# Patient Record
Sex: Female | Born: 1965 | Race: White | Hispanic: No | Smoking: Current some day smoker
Health system: Southern US, Community
[De-identification: ages and names within clinical notes are randomized; demographics above are authoritative.]

## PROBLEM LIST (undated history)

## (undated) DIAGNOSIS — J189 Pneumonia, unspecified organism: Secondary | ICD-10-CM

## (undated) DIAGNOSIS — M179 Osteoarthritis of knee, unspecified: Secondary | ICD-10-CM

## (undated) DIAGNOSIS — R51 Headache: Secondary | ICD-10-CM

## (undated) DIAGNOSIS — K219 Gastro-esophageal reflux disease without esophagitis: Secondary | ICD-10-CM

## (undated) DIAGNOSIS — Z91199 Patient's noncompliance with other medical treatment and regimen due to unspecified reason: Secondary | ICD-10-CM

## (undated) DIAGNOSIS — E119 Type 2 diabetes mellitus without complications: Secondary | ICD-10-CM

## (undated) DIAGNOSIS — K222 Esophageal obstruction: Secondary | ICD-10-CM

## (undated) DIAGNOSIS — Z9119 Patient's noncompliance with other medical treatment and regimen: Secondary | ICD-10-CM

## (undated) DIAGNOSIS — M5090 Cervical disc disorder, unspecified, unspecified cervical region: Secondary | ICD-10-CM

## (undated) DIAGNOSIS — I499 Cardiac arrhythmia, unspecified: Secondary | ICD-10-CM

## (undated) DIAGNOSIS — N189 Chronic kidney disease, unspecified: Secondary | ICD-10-CM

## (undated) DIAGNOSIS — I10 Essential (primary) hypertension: Secondary | ICD-10-CM

## (undated) DIAGNOSIS — R002 Palpitations: Secondary | ICD-10-CM

## (undated) DIAGNOSIS — R519 Headache, unspecified: Secondary | ICD-10-CM

## (undated) DIAGNOSIS — H33193 Other retinoschisis and retinal cysts, bilateral: Secondary | ICD-10-CM

## (undated) DIAGNOSIS — K589 Irritable bowel syndrome without diarrhea: Secondary | ICD-10-CM

## (undated) DIAGNOSIS — M1710 Unilateral primary osteoarthritis, unspecified knee: Secondary | ICD-10-CM

## (undated) DIAGNOSIS — F439 Reaction to severe stress, unspecified: Secondary | ICD-10-CM

## (undated) DIAGNOSIS — A498 Other bacterial infections of unspecified site: Secondary | ICD-10-CM

## (undated) DIAGNOSIS — N3010 Interstitial cystitis (chronic) without hematuria: Secondary | ICD-10-CM

## (undated) DIAGNOSIS — N390 Urinary tract infection, site not specified: Secondary | ICD-10-CM

## (undated) DIAGNOSIS — K602 Anal fissure, unspecified: Secondary | ICD-10-CM

## (undated) HISTORY — DX: Cervical disc disorder, unspecified, unspecified cervical region: M50.90

## (undated) HISTORY — DX: Patient's noncompliance with other medical treatment and regimen: Z91.19

## (undated) HISTORY — DX: Essential (primary) hypertension: I10

## (undated) HISTORY — DX: Palpitations: R00.2

## (undated) HISTORY — DX: Unilateral primary osteoarthritis, unspecified knee: M17.10

## (undated) HISTORY — DX: Anal fissure, unspecified: K60.2

## (undated) HISTORY — DX: Other bacterial infections of unspecified site: A49.8

## (undated) HISTORY — DX: Patient's noncompliance with other medical treatment and regimen due to unspecified reason: Z91.199

## (undated) HISTORY — PX: CYSTOSTOMY W/ BLADDER DILATION: SHX1432

## (undated) HISTORY — DX: Other retinoschisis and retinal cysts, bilateral: H33.193

## (undated) HISTORY — PX: ESOPHAGUS SURGERY: SHX626

## (undated) HISTORY — DX: Reaction to severe stress, unspecified: F43.9

## (undated) HISTORY — PX: LAPAROSCOPIC NEPHRECTOMY: SUR781

## (undated) HISTORY — DX: Gastro-esophageal reflux disease without esophagitis: K21.9

## (undated) HISTORY — DX: Interstitial cystitis (chronic) without hematuria: N30.10

## (undated) HISTORY — DX: Pneumonia, unspecified organism: J18.9

## (undated) HISTORY — PX: COLONOSCOPY: SHX174

## (undated) HISTORY — DX: Irritable bowel syndrome, unspecified: K58.9

## (undated) HISTORY — DX: Esophageal obstruction: K22.2

## (undated) HISTORY — DX: Osteoarthritis of knee, unspecified: M17.9

## (undated) HISTORY — PX: LAPAROSCOPIC VAGINAL HYSTERECTOMY: SUR798

## (undated) HISTORY — DX: Urinary tract infection, site not specified: N39.0

---

## 1993-11-13 HISTORY — PX: BREAST EXCISIONAL BIOPSY: SUR124

## 1999-01-25 ENCOUNTER — Other Ambulatory Visit: Admission: RE | Admit: 1999-01-25 | Discharge: 1999-01-25 | Payer: Self-pay | Admitting: Obstetrics and Gynecology

## 1999-05-27 ENCOUNTER — Observation Stay (HOSPITAL_COMMUNITY): Admission: AD | Admit: 1999-05-27 | Discharge: 1999-05-27 | Payer: Self-pay | Admitting: Obstetrics and Gynecology

## 1999-08-02 ENCOUNTER — Inpatient Hospital Stay (HOSPITAL_COMMUNITY): Admission: AD | Admit: 1999-08-02 | Discharge: 1999-08-06 | Payer: Self-pay | Admitting: Obstetrics and Gynecology

## 1999-08-06 ENCOUNTER — Encounter (HOSPITAL_COMMUNITY): Admission: RE | Admit: 1999-08-06 | Discharge: 1999-11-04 | Payer: Self-pay | Admitting: Obstetrics and Gynecology

## 1999-09-07 ENCOUNTER — Other Ambulatory Visit: Admission: RE | Admit: 1999-09-07 | Discharge: 1999-09-07 | Payer: Self-pay | Admitting: Obstetrics and Gynecology

## 1999-11-05 ENCOUNTER — Encounter: Admission: RE | Admit: 1999-11-05 | Discharge: 2000-02-03 | Payer: Self-pay | Admitting: Obstetrics and Gynecology

## 2000-04-11 ENCOUNTER — Encounter: Payer: Self-pay | Admitting: Family Medicine

## 2000-04-11 ENCOUNTER — Ambulatory Visit (HOSPITAL_COMMUNITY): Admission: RE | Admit: 2000-04-11 | Discharge: 2000-04-11 | Payer: Self-pay | Admitting: Family Medicine

## 2000-11-28 ENCOUNTER — Other Ambulatory Visit: Admission: RE | Admit: 2000-11-28 | Discharge: 2000-11-28 | Payer: Self-pay | Admitting: Obstetrics and Gynecology

## 2002-03-27 ENCOUNTER — Other Ambulatory Visit: Admission: RE | Admit: 2002-03-27 | Discharge: 2002-03-27 | Payer: Self-pay | Admitting: Obstetrics and Gynecology

## 2002-12-16 ENCOUNTER — Encounter: Payer: Self-pay | Admitting: Emergency Medicine

## 2002-12-16 ENCOUNTER — Emergency Department (HOSPITAL_COMMUNITY): Admission: EM | Admit: 2002-12-16 | Discharge: 2002-12-16 | Payer: Self-pay | Admitting: Emergency Medicine

## 2003-01-08 ENCOUNTER — Encounter (INDEPENDENT_AMBULATORY_CARE_PROVIDER_SITE_OTHER): Payer: Self-pay | Admitting: Specialist

## 2003-01-08 ENCOUNTER — Ambulatory Visit (HOSPITAL_COMMUNITY): Admission: RE | Admit: 2003-01-08 | Discharge: 2003-01-08 | Payer: Self-pay | Admitting: Obstetrics and Gynecology

## 2005-04-04 ENCOUNTER — Other Ambulatory Visit: Admission: RE | Admit: 2005-04-04 | Discharge: 2005-04-04 | Payer: Self-pay | Admitting: Obstetrics and Gynecology

## 2006-09-13 ENCOUNTER — Other Ambulatory Visit: Admission: RE | Admit: 2006-09-13 | Discharge: 2006-09-13 | Payer: Self-pay | Admitting: Gynecology

## 2007-01-12 HISTORY — PX: OTHER SURGICAL HISTORY: SHX169

## 2007-01-16 ENCOUNTER — Encounter (INDEPENDENT_AMBULATORY_CARE_PROVIDER_SITE_OTHER): Payer: Self-pay | Admitting: Specialist

## 2007-01-16 ENCOUNTER — Ambulatory Visit (HOSPITAL_BASED_OUTPATIENT_CLINIC_OR_DEPARTMENT_OTHER): Admission: RE | Admit: 2007-01-16 | Discharge: 2007-01-16 | Payer: Self-pay | Admitting: Gynecology

## 2007-12-05 ENCOUNTER — Other Ambulatory Visit: Admission: RE | Admit: 2007-12-05 | Discharge: 2007-12-05 | Payer: Self-pay | Admitting: Gynecology

## 2008-01-22 ENCOUNTER — Other Ambulatory Visit: Admission: RE | Admit: 2008-01-22 | Discharge: 2008-01-22 | Payer: Self-pay | Admitting: Gynecology

## 2008-02-19 ENCOUNTER — Ambulatory Visit (HOSPITAL_BASED_OUTPATIENT_CLINIC_OR_DEPARTMENT_OTHER): Admission: RE | Admit: 2008-02-19 | Discharge: 2008-02-20 | Payer: Self-pay | Admitting: Gynecology

## 2008-02-19 ENCOUNTER — Encounter: Payer: Self-pay | Admitting: Gynecology

## 2008-07-31 HISTORY — PX: US ECHOCARDIOGRAPHY: HXRAD669

## 2008-08-06 ENCOUNTER — Encounter: Admission: RE | Admit: 2008-08-06 | Discharge: 2008-08-06 | Payer: Self-pay | Admitting: Cardiology

## 2008-09-13 HISTORY — PX: OTHER SURGICAL HISTORY: SHX169

## 2008-10-12 ENCOUNTER — Ambulatory Visit (HOSPITAL_BASED_OUTPATIENT_CLINIC_OR_DEPARTMENT_OTHER): Admission: RE | Admit: 2008-10-12 | Discharge: 2008-10-12 | Payer: Self-pay | Admitting: Orthopedic Surgery

## 2008-11-13 HISTORY — PX: MOLE REMOVAL: SHX2046

## 2008-12-10 ENCOUNTER — Ambulatory Visit: Payer: Self-pay | Admitting: Gastroenterology

## 2008-12-10 DIAGNOSIS — R1319 Other dysphagia: Secondary | ICD-10-CM | POA: Insufficient documentation

## 2008-12-10 DIAGNOSIS — K219 Gastro-esophageal reflux disease without esophagitis: Secondary | ICD-10-CM | POA: Insufficient documentation

## 2008-12-15 ENCOUNTER — Telehealth: Payer: Self-pay | Admitting: Gastroenterology

## 2008-12-17 ENCOUNTER — Encounter: Payer: Self-pay | Admitting: Gastroenterology

## 2009-01-07 ENCOUNTER — Ambulatory Visit: Payer: Self-pay | Admitting: Gastroenterology

## 2009-01-07 ENCOUNTER — Encounter: Payer: Self-pay | Admitting: Gastroenterology

## 2009-01-10 ENCOUNTER — Encounter: Payer: Self-pay | Admitting: Gastroenterology

## 2009-01-14 ENCOUNTER — Telehealth: Payer: Self-pay | Admitting: Gastroenterology

## 2009-01-27 ENCOUNTER — Ambulatory Visit: Payer: Self-pay | Admitting: Gynecology

## 2009-02-17 ENCOUNTER — Ambulatory Visit: Payer: Self-pay | Admitting: Gastroenterology

## 2009-02-17 DIAGNOSIS — R1031 Right lower quadrant pain: Secondary | ICD-10-CM | POA: Insufficient documentation

## 2009-02-17 DIAGNOSIS — C4490 Unspecified malignant neoplasm of skin, unspecified: Secondary | ICD-10-CM | POA: Insufficient documentation

## 2009-02-17 DIAGNOSIS — R1032 Left lower quadrant pain: Secondary | ICD-10-CM

## 2009-02-17 DIAGNOSIS — K921 Melena: Secondary | ICD-10-CM | POA: Insufficient documentation

## 2009-02-24 ENCOUNTER — Ambulatory Visit: Payer: Self-pay | Admitting: Gastroenterology

## 2009-02-24 ENCOUNTER — Encounter: Payer: Self-pay | Admitting: Gastroenterology

## 2009-02-28 ENCOUNTER — Encounter: Payer: Self-pay | Admitting: Gastroenterology

## 2009-08-02 ENCOUNTER — Ambulatory Visit: Payer: Self-pay | Admitting: Gynecology

## 2009-08-02 ENCOUNTER — Encounter: Payer: Self-pay | Admitting: Gynecology

## 2009-08-02 ENCOUNTER — Other Ambulatory Visit: Admission: RE | Admit: 2009-08-02 | Discharge: 2009-08-02 | Payer: Self-pay | Admitting: Gynecology

## 2009-09-24 ENCOUNTER — Telehealth: Payer: Self-pay | Admitting: Gastroenterology

## 2009-11-13 HISTORY — PX: KNEE ARTHROSCOPY: SHX127

## 2009-11-25 ENCOUNTER — Ambulatory Visit (HOSPITAL_BASED_OUTPATIENT_CLINIC_OR_DEPARTMENT_OTHER): Admission: RE | Admit: 2009-11-25 | Discharge: 2009-11-25 | Payer: Self-pay | Admitting: Orthopedic Surgery

## 2009-12-02 ENCOUNTER — Inpatient Hospital Stay (HOSPITAL_COMMUNITY): Admission: EM | Admit: 2009-12-02 | Discharge: 2009-12-05 | Payer: Self-pay | Admitting: Orthopedic Surgery

## 2010-03-16 DIAGNOSIS — M2569 Stiffness of other specified joint, not elsewhere classified: Secondary | ICD-10-CM | POA: Insufficient documentation

## 2010-03-16 DIAGNOSIS — I10 Essential (primary) hypertension: Secondary | ICD-10-CM | POA: Insufficient documentation

## 2010-03-16 DIAGNOSIS — IMO0002 Reserved for concepts with insufficient information to code with codable children: Secondary | ICD-10-CM | POA: Insufficient documentation

## 2010-03-16 DIAGNOSIS — K449 Diaphragmatic hernia without obstruction or gangrene: Secondary | ICD-10-CM | POA: Insufficient documentation

## 2010-03-16 DIAGNOSIS — M542 Cervicalgia: Secondary | ICD-10-CM | POA: Insufficient documentation

## 2010-11-15 ENCOUNTER — Telehealth: Payer: Self-pay | Admitting: Gastroenterology

## 2010-12-13 NOTE — Progress Notes (Signed)
Summary: ? EGD  Phone Note Call from Patient Call back at Home Phone [phone] Call back at C# [phone]   Caller: Patient Call For: Deanna George  Reason for Call: Talk to Nurse Details for Reason: ? EGD  Summary of Call: had EGD last week.Marland KitchenMarland KitchenMarland KitchenYuka George is unclear about growth in her throat and the biopsy ( would like a ph call back today so she can have a piece of mind)  Initial call taken by: Guadlupe [facility],  January 14, 2009 1:33 PM  Follow-up for Phone Call        went over the procedure and the reflux regime again with the patient and instructed about the importance of taking her nexium on a daily basis. Also advised her she will be getting a letter in the mail Follow-up by: Harlow Mares CMA,  January 14, 2009 2:22 PM

## 2010-12-13 NOTE — Assessment & Plan Note (Signed)
Summary: abd pain, rectal bleeding,hemmorroids.Marland Kitchenem   History of Present Illness Visit Type: Follow-up Visit Primary GI MD: Elie Goody MD [facility] Primary Provider: none Chief Complaint: abdominal pain,rectal bleeding and hemorrhoids History of Present Illness:   Deanna George returns today complaining of chronic intermittent left lower quadrant abdominal pain associated with bowel movements. She notes a variation between loose watery bowel movements and small pellet-like bowel movements. The pain generally improves or abates with a bowel movement. She has had intermittent small-volume hematochezia for many years. She states she underwent colonoscopy in 1992 in [place] and irritable bowel syndrome and hemorrhoids were diagnosed.  Her dysphagia resolved following dilation. Reflux symptoms are under good control on Nexium   GI Review of Systems    Reports abdominal pain.     Location of  Abdominal pain: LLQ.    Denies acid reflux, belching, bloating, chest pain, dysphagia with liquids, dysphagia with solids, heartburn, loss of appetite, nausea, vomiting, vomiting blood, weight loss, and  weight gain.      Reports constipation, diarrhea, hemorrhoids, irritable bowel syndrome, and  rectal bleeding.     Denies anal fissure, black tarry stools, change in bowel habit, diverticulosis, fecal incontinence, heme positive stool, jaundice, light color stool, liver problems, and  rectal pain.   Current Medications (verified): 1)  Benicar 20 Mg Tabs (Olmesartan Medoxomil) .... Once Daily 2)  Cyclobenzaprine Hcl 10 Mg Tabs (Cyclobenzaprine Hcl) .... Take 1 Tab 2 To 3 Times Daily As Needed For Spasms 3)  Celebrex 200 Mg Caps (Celecoxib) .... Take As Needed Once Weekly 4)  Oscal 500/200 D-3 500-200 Mg-Unit Tabs (Calcium-Vitamin D) .... Take As Needed 5)  Nexium 40 Mg Cpdr (Esomeprazole Magnesium) .... One Tablet By Mouth Once Daily  Allergies (verified): No Known Drug Allergies  Past History:  Past  Medical History:    Anxiety Disorder    Glaucoma    Hypertension    Interstitial Cystitis    Pneumonia    Urinary Tract Infection    GERD/EE    Esophageal stricture    IBS  Past Surgical History:    Hysterectomy, 02/2008    Lapascopic surgery x2 ( uterus & ovary), 01/2007    C-section x2, 1998, 2000    L foot pin post fracture, 09/2008    Skin cancer removal multiple  Family History:    Reviewed history from 12/10/2008 and no changes required:       Patient adopted   Social History:    Reviewed history from 12/10/2008 and no changes required:       Occupation:Housewife       Patient has never smoked.        Alcohol Use - no       Daily Caffeine Use 1-2 weekly       Illicit Drug Use - no  Review of Systems       The patient complains of back pain, fatigue, and urination changes/pain.         The pertinent positives and negatives are noted as above and in the HPI. All other ROS were reviewed and were negative.   Vital Signs:  Patient profile:   45 year old female Height:      64.5 inches Weight:      185.38 pounds BMI:     31.44 Pulse rate:   84 / minute BP sitting:   128 / 88  (left arm)  Vitals Entered By: Milford Cage CMA (February 17, 2009 9:29 AM)  Physical  Exam  General:  Well developed, well nourished, no acute distress. Head:  Normocephalic and atraumatic. Mouth:  No deformity or lesions, dentition normal. Lungs:  Clear throughout to auscultation. Heart:  Regular rate and rhythm; no murmurs, rubs,  or bruits. Abdomen:  Soft, nontender and nondistended. No masses, hepatosplenomegaly or hernias noted. Normal bowel sounds. Rectal:  deferred until time of colonoscopy.   Neurologic:  Alert and  oriented x4;  grossly normal neurologically. Psych:  anxious.     Impression & Recommendations:  Problem # 1:  ABDOMINAL PAIN-LLQ (ICD-789.04) Recurrent left lower quadrant abdominal pain associated with variable bowel habits. Her symptoms are typical for irritable  bowel syndrome. Exclude other disorders with colonoscopy. The risks, benefits and alternatives to colonoscopy with possible biopsy and possible polypectomy were discussed with the patient and they consent to proceed. The procedure will be scheduled electively. Orders: Colonoscopy (Colon)  Problem # 2:  BLOOD IN STOOL (ICD-578.1) Intermittent small-volume hematochezia. This pattern is typical for hemorrhoidal bleeding. Need to exclude proctitis and colorectal neoplasms. Colonoscopy as a problem #1  Orders: Colonoscopy (Colon)  Problem # 3:  GERD (ICD-530.81) Assessment: Improved Complicated by erosive esophagitis and a peptic stricture. Dysphagia resolved with dilation. Long-term PPI therapy. Nexium 40 mg q.a.m. and standard antireflux measures.  Patient Instructions: 1)  Colonoscopy brochure given.  2)  Conscious Sedation brochure given.  3)  The medication list was reviewed and reconciled.  All changed / newly prescribed medications were explained.  A complete medication list was provided to the patient / caregiver.  Prescriptions: MOVIPREP 100 GM  SOLR (PEG-KCL-NACL-NASULF-NA ASC-C) As per prep instructions.  #1 x 0   Entered by:   Christie Nottingham CMA   Authorized by:   Meryl Dare MD [facility]   Signed by:   Christie Nottingham CMA on 02/17/2009   Method used:   Electronically to        [id number] (retail)       [address]       [place], [place]  [address]       Ph: [phone]       Fax: [phone]   RxID:   [id number]

## 2010-12-13 NOTE — Progress Notes (Signed)
Summary: Samples helped her alot  Phone Note Call from Patient Call back at [phone] cell   Call For: Dr Russella Dar Reason for Call: Talk to Nurse Summary of Call: Question about the diffrent prescription you were calling in. Samples that were given to her one week ago worked extremely well. Alll her symptoms are gone. By the third day no hurt, no discomfort, no acid reflux. Can now swallow and wonders if she still needs Endo.  Initial call taken by: Leanor Kail [facility],  December 15, 2008 10:07 AM  Follow-up for Phone Call        spoke with patient she does need EGD on Friday she will keep appt.  Given more samples of nexium.  RX sent to pharmacy Follow-up by: Darcey Nora RN,  December 15, 2008 10:40 AM      Prescriptions: NEXIUM 40 MG CPDR (ESOMEPRAZOLE MAGNESIUM) one tablet by mouth once daily  #30 x 11   Entered by:   Darcey Nora RN   Authorized by:   Meryl Dare MD [facility]   Signed by:   Darcey Nora RN on 12/15/2008   Method used:   Electronically to        [id number]* (retail)       [address]       [place], [place]  [address]       Ph: [phone]       Fax: [phone]   RxID:   [id number]

## 2010-12-13 NOTE — Letter (Signed)
Summary: Patient Notice-Hyperplastic Polyps  [facility]  [address] [place], [place] [address]   Phone: [phone]  Fax: [phone]        February 28, 2009 MRN: [id number]    [facility] [address] [place], [place]  [address]    Dear Ms. Greenslade,  I am pleased to inform you that the colon polyps removed during your recent colonoscopy were found to be hyperplastic. These types of polyps are NOT pre-cancerous.  It is therefore my recommendation that you have a repeat colonoscopy examination in 10 years for routine colorectal cancer screening.  Should you develop new or worsening symptoms of abdominal pain, bowel habit changes or bleeding from the rectum or bowels, please schedule an evaluation with either your primary care physician or with me.  Continue treatment plan as outlined the day of your exam.  Please call us if you are having persistent problems or have questions about your condition that have not been fully answered at this time.  Sincerely,  Meryl Dare MD [facility]  This letter has been electronically signed by your physician.

## 2010-12-13 NOTE — Assessment & Plan Note (Signed)
Summary: CHOKING/ACID REFLUX...EM   History of Present Illness Visit Type: new patient Primary GI MD: Elie Goody MD [facility] Chief Complaint: dysphagia-solids, gerd History of Present Illness:   This is a 45 year old white female who relates significant problems with acid reflux and worsening solid food dysphagia since September 2009. She has postprandial and nighttime heartburn and regurgitation. Has been taking Prevacid OTC for the past 1 to 2 weeks with some improvement in symptoms.   GI Review of Systems    Reports acid reflux, bloating, dysphagia with solids, loss of appetite, and  nausea.      Denies abdominal pain, belching, chest pain, dysphagia with liquids, heartburn, vomiting, vomiting blood, weight loss, and  weight gain.      Reports change in bowel habits and  hemorrhoids.     Denies anal fissure, black tarry stools, constipation, diarrhea, diverticulosis, fecal incontinence, heme positive stool, irritable bowel syndrome, jaundice, light color stool, liver problems, rectal bleeding, and  rectal pain.    Prior Medications Reviewed Using: Medication Bottles  Updated Prior Medication List: BENICAR 20 MG TABS (OLMESARTAN MEDOXOMIL) once daily PREVACID 15 MG CPDR (LANSOPRAZOLE) once daily CYCLOBENZAPRINE HCL 10 MG TABS (CYCLOBENZAPRINE HCL) Take 1 tab 2 to 3 times daily as needed for spasms CELEBREX 200 MG CAPS (CELECOXIB) two times a day OSCAL 500/200 D-3 500-200 MG-UNIT TABS (CALCIUM-VITAMIN D) two times a day TUMS 500 MG CHEW (CALCIUM CARBONATE ANTACID) as needed  Current Allergies: No known allergies  Past Medical History:    Anxiety Disorder    Glaucoma    Hypertension    Interstitial Cystitis    Pneumonia    Urinary Tract Infection  Past Surgical History:    Hysterectomy, 02/2008    Lapascopic surgery x2 ( uterus & ovary), 01/2007    C-section x2, 1998, 2000    L foot pin post fracture, 09/2008  Family History:    Patient adopted   Social History:  Occupation:Housewife    Patient has never smoked.     Alcohol Use - no    Daily Caffeine Use 1-2 weekly    Illicit Drug Use - no  Risk Factors:  Tobacco use:  never Drug use:  no Alcohol use:  no  Review of Systems       The patient complains of blood in urine, heart rhythm changes, sore throat, and urine leakage.         The pertinent positives and negatives are noted as above and in the HPI. All other ROS were negative.   Vital Signs:  Patient Profile:   45 Years Old Female Height:     64 inches Weight:      184.13 pounds BMI:     31.72 Pulse rate:   60 / minute Pulse rhythm:   regular BP sitting:   120 / 88  (left arm)  Vitals Entered By: June McMurray CMA (December 10, 2008 9:54 AM)                  Physical Exam  General:     Well developed, well nourished, no acute distress. Head:     Normocephalic and atraumatic. Eyes:     PERRLA, no icterus. Ears:     Normal auditory acuity. Mouth:     No deformity or lesions, dentition normal. Neck:     Supple; no masses or thyromegaly. Lungs:     Clear throughout to auscultation. Heart:     Regular rate and rhythm; no murmurs, rubs,  or bruits. Abdomen:     Soft, nontender and nondistended. No masses, hepatosplenomegaly or hernias noted. Normal bowel sounds. Msk:     Symmetrical with no gross deformities. Normal posture. Pulses:     Normal pulses noted. Extremities:     No clubbing, cyanosis, edema or deformities noted. Neurologic:     Alert and  oriented x4;  grossly normal neurologically. Cervical Nodes:     No significant cervical adenopathy. Psych:     Alert and cooperative. anxious.     Impression & Recommendations:  Problem # 1:  OTHER DYSPHAGIA (ICD-787.29) Worsening solid food dysphagia. Rule out esophageal strictures, infectious esophagitis and other disorders. Discontinue Prevacid OTC and begin Nexium 40 mg q.a.m. The risks, benefits and alternatives to endoscopy with possible biopsy and  possible dilation were discussed with the patient and they consent to proceed. The procedure will be scheduled electively.  Orders: EGD SAV (EGD SAV)   Problem # 2:  GERD (ICD-530.81) As in problem #1. Orders: EGD SAV (EGD SAV)   Patient Instructions: 1)  You have been scheduled for a EGD with savaray.  2)  Avoid foods high in acid content (tomatoes, citrus juices, spicy foods). Avoid eating within 3 to 4 hours of lying down or before exercising. Do not over eat; try smaller more frequent meals. Elevate head of bed four inches when sleeping. 3)  Stop Prevacid and start Nexium and pick prescription at your pharmacy.

## 2010-12-13 NOTE — Progress Notes (Signed)
Summary: Nexium  Phone Note Call from Patient Call back at [phone]   Caller: Patient Call For: Dr. Russella Dar Reason for Call: Talk to Nurse Details for Reason: Nexium Summary of Call: Patient called this morning stating that it was time for her to refill her Nexium; however, her copay with Nexium is $65.  She asked if there was another medication that would work just as good as Nexium but that would not cost her as much.  She asked if you would call and let her know and that if you couldn't reach her directly to please leave her a voice mail.  She uses the [facility] on [address].  Her cell phone number is [phone].  The phone number for the pharmacy at [facility] is [phone].  Thank you. Initial call taken by: Schuyler Amor,  September 24, 2009 10:47 AM  Follow-up for Phone Call        Left a message stating the only medicine that her insurance will cover is omeprazole 40mg  once daily that I can tell and to call our office.  Follow-up by: Christie Nottingham CMA [facility]),  September 24, 2009 1:45 PM  Additional Follow-up for Phone Call Additional follow up Details #1::        Pt returned my call and states that would like to try omeprazole to see if this is a cheaper copay for her and if it is not then she will go back on Nexium. Rx was sent to pts pharmacy.  Additional Follow-up by: Christie Nottingham CMA (AAMA),  September 24, 2009 2:20 PM    New/Updated Medications: OMEPRAZOLE 40 MG CPDR (OMEPRAZOLE) one tablet by mouth once daily Prescriptions: OMEPRAZOLE 40 MG CPDR (OMEPRAZOLE) one tablet by mouth once daily  #30 x 11   Entered by:   Christie Nottingham CMA (AAMA)   Authorized by:   Meryl Dare MD [facility]   Signed by:   Christie Nottingham CMA [facility]) on 09/24/2009   Method used:   Electronically to        [facility] (retail)       [address]       [place], [place]  [address]       Ph: [phone] or  [phone]       Fax: [phone]   RxID:   [id number]

## 2010-12-13 NOTE — Letter (Signed)
Summary: Patient [facility] Biopsy Results  [facility]  [address] [place], [place] [address]   Phone: [phone]  Fax: [phone]        January 10, 2009 MRN: [id number]    [name] [address] [place], [place]  [address]    Dear Ms. Prewitt,  I am pleased to inform you that the biopsies taken during your recent endoscopic examination did not show any evidence of cancer upon pathologic examination. The biopies showed changes of acid reflux.  Continue with the treatment plan as outlined on the day of your      exam.  Please call us if you are having persistent problems or have questions about your condition that have not been fully answered at this time.  Sincerely,  Meryl Dare MD [facility]  This letter has been electronically signed by your physician.

## 2010-12-13 NOTE — Procedures (Signed)
Summary: Colonoscopy   Colonoscopy  Procedure date:  02/24/2009  Findings:      Location:  [facility].    Procedures Next Due Date:    Colonoscopy: 02/2019  COLONOSCOPY PROCEDURE REPORT  PATIENT:  Deanna, George  MR#:  [id number] BIRTHDATE:   14-Aug-1966, 42 yrs. old   GENDER:   female  ENDOSCOPIST:   Judie Petit T. Russella Dar, MD, [facility]    PROCEDURE DATE:  02/24/2009 PROCEDURE:  Colonoscopy with biopsy ASA CLASS:   Class II INDICATIONS: 1) hematochezia  2) LLQ abdominal pain   MEDICATIONS:    Benadryl 50 mg IV, Versed 12 mg IV, Fentanyl 125 mcg IV  DESCRIPTION OF PROCEDURE:   After the risks benefits and alternatives of the procedure were thoroughly explained, informed consent was obtained.  Digital rectal exam was performed and revealed small external hemorrhoids.   The LB [id number] endoscope was introduced through the anus and advanced to the cecum, which was identified by both the appendix and ileocecal valve, without limitations.  The quality of the prep was excellent, using MoviPrep.  The instrument was then slowly withdrawn as the colon was fully examined. <<PROCEDUREIMAGES>>              <<OLD IMAGES>>  FINDINGS:  Three polyps were found in the rectum and sigmoid colon. They were 2 - 3 mm in size. The polyps were removed using cold biopsy forceps.  The terminal ileum appeared normal. This was otherwise a normal examination of the colon. Retroflexed views in the rectum revealed no abnormalities. The time to cecum =  2  minutes. The scope was then withdrawn (time =  11.75  min) from the patient and the procedure completed.  COMPLICATIONS:   None  ENDOSCOPIC IMPRESSION:  1) 2 - 3 mm, three polyps in the rectum and sigmoid colon  2) Normal terminal ileum  RECOMMENDATIONS:  1) await pathology results  2) If the polyp(s) removed today are proven to be adenomatous (pre-cancerous) polyps, you will need a repeat colonoscopy in 5 years. Otherwise you should  continue to follow colorectal cancer screening guidelines for "routine risk" patients with colonoscopy in 10 years. 3) treat for IBS and hemorrhoids     Caspar Favila T. Russella Dar, MD, [facility]    CC:       REPORT OF SURGICAL PATHOLOGY   Case #: [id number] Patient Name: Deanna George, Deanna George. Office Chart Number:  [id number]   MRN: [id number] Pathologist: Beulah Gandy. Luisa Hart, MD DOB/Age  01/18/1966 (Age: 57)    Gender: F Date Taken:  02/24/2009 Date Received: 02/25/2009   FINAL DIAGNOSIS   ***MICROSCOPIC EXAMINATION AND DIAGNOSIS***   COLON, SIGMOID AND RECTAL POLYPS:  HYPERPLASTIC POLYPS.  NO ADENOMATOUS CHANGE OR MALIGNANCY IDENTIFIED.   mw Date Reported:  02/26/2009     Beulah Gandy. Luisa Hart, MD    February 28, 2009 MRN: [id number]    [facility] [address] [place], [place]  [address]    Dear Ms. Mehra,  I am pleased to inform you that the colon polyps removed during your recent colonoscopy were found to be hyperplastic. These types of polyps are NOT pre-cancerous.  It is therefore my recommendation that you have a repeat colonoscopy examination in 10 years for routine colorectal cancer screening.  Should you develop new or worsening symptoms of abdominal pain, bowel habit changes or bleeding from the rectum or bowels, please schedule an evaluation with either your primary care physician or with me.  Continue treatment plan as outlined  the day of your exam.  Please call us if you are having persistent problems or have questions about your condition that have not been fully answered at this time.  Sincerely,  Meryl Dare MD [facility]  This letter has been electronically signed by your physician.    This report was created from the original endoscopy report, which was reviewed and signed by the above listed endoscopist.

## 2010-12-15 NOTE — Progress Notes (Signed)
Summary: Medication  Phone Note Call from Patient Call back at [phone]   Caller: Patient Call For: Dr. Russella Dar Reason for Call: Talk to Nurse Summary of Call: Pt needs her omeprazole refilled but now it must go through her insurance company, also has a questions about what she can use in the mean time before her medication gets mailed Initial call taken by: [place] Johnson,  November 15, 2010 11:47 AM  Follow-up for Phone Call        Sent omeprazole to [facility] for a 90 day supply because pt's prescription ran out in November and she is concerned she will have a problem getting her prescription. Told pt that she should take omeprazole 20mg  2 tablets by mouth once daily in the meantime of waiting for her mail order. Pt agreed.  Follow-up by: Christie Nottingham CMA (AAMA),  November 15, 2010 12:01 PM    New/Updated Medications: OMEPRAZOLE 40 MG CPDR (OMEPRAZOLE) one tablet by mouth once daily Prescriptions: OMEPRAZOLE 40 MG CPDR (OMEPRAZOLE) one tablet by mouth once daily  #90 x 0   Entered by:   Christie Nottingham CMA (AAMA)   Authorized by:   Meryl Dare MD [facility]   Signed by:   Christie Nottingham CMA (AAMA) on 11/15/2010   Method used:   Electronically to        [facility]* (retail)             ,          Ph: [phone]       Fax: [phone]   RxID:   [id number]

## 2010-12-27 ENCOUNTER — Telehealth: Payer: Self-pay | Admitting: Gastroenterology

## 2011-01-04 NOTE — Progress Notes (Signed)
Summary: sched f/u appt  Phone Note Outgoing Call Call back at [facility] Phone [phone] Call back at cell # [phone]   Call placed by: Christie Nottingham CMA [facility]),  December 27, 2010 8:42 AM Call placed to: Patient Summary of Call: Called and left a message with patient to schedule her follow-up appointment for late March.  Initial call taken by: Christie Nottingham CMA [facility]),  December 27, 2010 8:43 AM  Follow-up for Phone Call        Scheduled follow-up visit on March 21st at 4:00pm. Pt verbalized understanding.  Follow-up by: Christie Nottingham CMA [facility]),  December 28, 2010 4:04 PM

## 2011-01-29 LAB — SYNOVIAL CELL COUNT + DIFF, W/ CRYSTALS
Crystals, Fluid: NONE SEEN
Lymphocytes-Synovial Fld: 4 % (ref 0–20)
Monocyte-Macrophage-Synovial Fluid: 4 % — ABNORMAL LOW (ref 50–90)
Neutrophil, Synovial: 92 % — ABNORMAL HIGH (ref 0–25)
WBC, Synovial: 5361 /mm3 — ABNORMAL HIGH (ref 0–200)

## 2011-01-29 LAB — POCT I-STAT, CHEM 8
BUN: 7 mg/dL (ref 6–23)
Calcium, Ion: 1.15 mmol/L (ref 1.12–1.32)
Chloride: 103 mEq/L (ref 96–112)
Creatinine, Ser: 0.6 mg/dL (ref 0.4–1.2)
Glucose, Bld: 112 mg/dL — ABNORMAL HIGH (ref 70–99)
HCT: 37 % (ref 36.0–46.0)
Hemoglobin: 12.6 g/dL (ref 12.0–15.0)
Potassium: 3.6 mEq/L (ref 3.5–5.1)
Sodium: 138 mEq/L (ref 135–145)
TCO2: 26 mmol/L (ref 0–100)

## 2011-01-29 LAB — VANCOMYCIN, TROUGH: Vancomycin Tr: 14.6 ug/mL (ref 10.0–20.0)

## 2011-01-29 LAB — BASIC METABOLIC PANEL
BUN: 7 mg/dL (ref 6–23)
CO2: 22 mEq/L (ref 19–32)
Calcium: 8.1 mg/dL — ABNORMAL LOW (ref 8.4–10.5)
Chloride: 101 mEq/L (ref 96–112)
Creatinine, Ser: 0.58 mg/dL (ref 0.4–1.2)
GFR calc Af Amer: >60 mL/min (ref >60)
GFR calc non Af Amer: >60 mL/min (ref >60)
Glucose, Bld: 128 mg/dL — ABNORMAL HIGH (ref 70–99)
Potassium: 3.6 mEq/L (ref 3.5–5.1)
Sodium: 131 mEq/L — ABNORMAL LOW (ref 135–145)

## 2011-01-29 LAB — ANAEROBIC CULTURE

## 2011-01-29 LAB — BODY FLUID CULTURE: Culture: NO GROWTH

## 2011-01-29 LAB — CBC
HCT: 28.7 % — ABNORMAL LOW (ref 36.0–46.0)
Hemoglobin: 10 g/dL — ABNORMAL LOW (ref 12.0–15.0)
MCHC: 34.7 g/dL (ref 30.0–36.0)
MCV: 88.2 fL (ref 78.0–100.0)
Platelets: 198 10*3/uL (ref 150–400)
RBC: 3.26 MIL/uL — ABNORMAL LOW (ref 3.87–5.11)
RDW: 13.8 % (ref 11.5–15.5)
WBC: 12.5 10*3/uL — ABNORMAL HIGH (ref 4.0–10.5)

## 2011-02-01 ENCOUNTER — Ambulatory Visit: Payer: Self-pay | Admitting: Gastroenterology

## 2011-03-01 DIAGNOSIS — N3010 Interstitial cystitis (chronic) without hematuria: Secondary | ICD-10-CM | POA: Insufficient documentation

## 2011-03-14 ENCOUNTER — Ambulatory Visit: Payer: Self-pay | Admitting: Gastroenterology

## 2011-03-28 NOTE — Op Note (Signed)
NAMESIMRIN, VEGH                 ACCOUNT NO.:  [id number]   MEDICAL RECORD NO.:  [id number]          PATIENT TYPE:  AMB   LOCATION:  [facility]                          FACILITY:  [facility]   PHYSICIAN:  Robert A. Thurston Hole, M.D. DATE OF BIRTH:  June 16, 1966   DATE OF PROCEDURE:  10/12/2008  DATE OF DISCHARGE:                               OPERATIVE REPORT   PREOPERATIVE DIAGNOSIS:  Left fifth metatarsal Jones fracture.   POSTOPERATIVE DIAGNOSIS:  Left fifth metatarsal Jones fracture.   PROCEDURE:  Open reduction and internal fixation of the left fifth  metatarsal Jones fracture using cannulated 4.5 mm screw.   SURGEON:  Elana Alm. Thurston Hole, MD   ASSISTANT:  Julien Girt, PA   ANESTHESIA:  General.   OPERATIVE TIME:  40 minutes.   COMPLICATIONS:  None.   INDICATIONS FOR PROCEDURE:  Ms. Doffing is a 45 year old woman who  sustained a left fifth metatarsal Jones type fracture a week ago with a  twisting injury.  Significant pain with exam and x-rays documenting this  Jones type fracture and due to the delayed healing potential of this,  she is now to undergo ORIF of this.   DESCRIPTION:  Ms. Salaz was brought to the operating room on October 12, 2008.  After an ankle block was placed in holding area by  anesthesia, she was placed in the operating table in supine position.  She received Ancef 1g IV preoperatively for prophylaxis.  After being  placed under general anesthesia, her left foot and leg was prepped using  sterile DuraPrep and draped using sterile technique.  Using fluoroscopy,  a 5-7 mm longitudinal incision was made proximal to the base of the  fifth metatarsal.  A guidepin from the cannulated 4.5 mm screw set was  placed at the proximal tip under fluoroscopic control and then drilled  distally down the fifth metatarsal canal across the fracture site into  the distal fifth metatarsal canal under fluoroscopic control.  This was  then measured full length, and was felt that  a 42-mm length would be  appropriate and then this was overdrilled with a 3.2 mm drill, and then  a 42 mm x 4.5 mm cannulated screw was screwed in over this guidepin with  a firm and tight fixation with fluoroscopic guidance as well.  After  this was done, there was found to be excellent fixation with anatomic  reduction and internal fixation of the fracture and satisfactory  position of the hardware.  At this point, then the wound was irrigated  and closed with interrupted 4-0 nylon sutures.  Sterile dressings and a  short-leg splint applied and then the patient awakened and taken to  recovery room in stable condition.   FOLLOWUP CARE:  Ms. Grout to be followed as an outpatient on Demerol  for pain and Flexeril.  She will be back in the office in a week for  wound check and followup.      Robert A. Thurston Hole, M.D.  Electronically Signed     RAW/MEDQ  D:  10/12/2008  T:  10/13/2008  Job:  [id number]

## 2011-03-28 NOTE — H&P (Signed)
NAMEROXSANA, RIDING                 ACCOUNT NO.:  [id number]   MEDICAL RECORD NO.:  [id number]          PATIENT TYPE:  AMB   LOCATION:  [facility]                         FACILITY:  [facility]   PHYSICIAN:  Timothy P. Fontaine, M.D.DATE OF BIRTH:  [birth year]   DATE OF ADMISSION:  02/19/2008  DATE OF DISCHARGE:                              HISTORY & PHYSICAL   CHIEF COMPLAINT:  Increasing dysmenorrhea, menorrhagia.   HISTORY OF PRESENT ILLNESS:  A 45 year old, G3, P2, AB1, female  currently on Yaz oral contraceptives who was admitted for LAVH due to  worsening menorrhagia, dysmenorrhea and leiomyoma.  The patient has  ultrasound which demonstrates multiple small myomas, otherwise is  normal.  Has undergone prior laparoscopy with endometriosis documented.  She has  tried various conservative approaches and is admitted at this  time for definitive therapy to include LAVH.   PAST MEDICAL HISTORY:  Includes Interstitial cystitis.   PAST SURGICAL HISTORY:  Includes  1. Laparoscopy x2.  2. Hysteroscopy.  3. Cesarean section x2.  4. D&C x2.   ALLERGIES:  No medication.   CURRENT MEDICATIONS:  Includes Celebrex for degenerative arthritis and  Yaz.   REVIEW OF SYSTEMS:  Noncontributory.   FAMILY HISTORY:  Noncontributory.   SOCIAL HISTORY:  Noncontributory.   PHYSICAL EXAMINATION ON ADMISSION:  VITAL SIGNS:  Afebrile vital signs  stable.  HEENT: Normal.  LUNGS:  Clear.  CARDIAC:  Regular rate without rubs, murmurs or gallops.  ABDOMINAL:  Exam benign.  PELVIC:  External BUS, vagina normal.  Cervix normal.  Uterus normal  size, midline and mobile, nontender.  Adnexa without masses or  tenderness.   ASSESSMENT:  A 45 year old, G3, P2, AB1, female worsening dysmenorrhea,  menorrhagia, history of endometriosis, leiomyoma, ascus Pap smear for  LAVH.  I reviewed the proposed surgery with the patient to include the  expected intraoperative/postoperative courses, short-term/long-term  issues  associated with hysterectomy as well as the acute  intraoperative/postoperative risks.  I reviewed the issues of ovarian  conservation with her and the options of removing both ovaries versus  keeping both ovaries were discussed, the issues of continued hormone  production versus long-term issues of continued or recurrent pain and  ovarian cancer in the future was all reviewed with her.  After a lengthy  discussion, she wants to keep one or both ovaries but she does give me  permission to remove one or both ovaries if significant pathology or if,  in my discretion intraoperatively, I feel removal of both ovaries is  needed, then she gives me permission to do so.  She does want to keep  both ovaries and accepts the risks of ovarian cancer and ovarian pain  and reoperation in the future.  The patient understands there are no  guarantees as far as pain relief or PMS symptoms following hysterectomy.  She has been on oral contraceptives long-term or pregnant and is unsure  what her cycle from a PMS standpoint will be whenever she has normal  ovarian functioning.  She understands and accepts the possibility of  dysfunction and the long-term issues of pain following  hysterectomy.  The patient understands the absolute irreversible sterility associated  with hysterectomy.  She has thought of these issues and she is  comfortable with never having children in the future and accepts the  absolute sterility.  Sexuality following hysterectomy was also reviewed  and the potential for orgasmic dysfunction or persistent dyspareunia  following hysterectomy was discussed, understood and accepted.  The  procedure was reviewed to include instrumentation, trocar placement,  multiple port sites, use of sharp and blunt dissection, electrocautery,  Harmonic scalpel all discussed, understood and accepted.  The acute  risks of the surgery, either immediately or delay recognized were  reviewed to include the risks of  infection requiring prolonged  antibiotics, abscess formation such as cuff abscess or cuff hematoma  requiring reoperation and drainage was discussed as well as incisional  complications requiring opening and draining of incisions, closure by  secondary intention and the long-term risks of hernia formation.  She  understands that if any time during the procedure we may convert the  laparoscopic approach to an exploratory laparotomy if complications  arise or if it is felt, in my judgment, safer to proceed with the  opening case, then we will do so and she understands and accepts this  possibility.  The risks of hemorrhage necessitating transfusion and  risks of transfusion were reviewed to include transfusion reaction,  hepatitis, HIV, Mad-Cow disease and other unknown entities in the  future.  The risks of injury to internal organs, again either  immediately recognized, delay recognized to include bowel, bladder  ureters, vessels and nerves necessitating major exploratory reparative  surgeries, future reparative surgeries including ostomy formation, bowel  resection, bladder repair, ureteral surgery and reimplantation all  discussed, understood and accepted.  Having had prior cesarean sections,  having a history of endometriosis, having had prior laparoscopies, she  understands these all increased the risk of an inadvertent injury to  internal organs and she understands the realistic risks and accepts  these.  The patient's questions were answered to her satisfaction.  She  is ready to proceed with surgery.      Timothy P. Fontaine, M.D.  Electronically Signed     TPF/MEDQ  D:  02/17/2008  T:  02/17/2008  Job:  [id number]

## 2011-03-28 NOTE — Discharge Summary (Signed)
[name], Deanna George                 ACCOUNT NO.:  [id number]   MEDICAL RECORD NO.:  [id number]          PATIENT TYPE:  AMB   LOCATION:  [facility]                         FACILITY:  [facility]   PHYSICIAN:  Timothy P. Fontaine, M.D.DATE OF BIRTH:  12-Feb-1966   DATE OF ADMISSION:  02/19/2008  DATE OF DISCHARGE:  02/20/2008                               DISCHARGE SUMMARY   DISCHARGE DIAGNOSES:  1. Menorrhagia.  2. Dysmenorrhea.  3. Leiomyoma.  4. Hypertension.   PROCEDURE:  Laparoscopic-assisted vaginal hysterectomy on February 19, 2008,  pathology pending.   HOSPITAL COURSE:  This 44 year old underwent uncomplicated LAVH  secondary to menorrhagia and dysmenorrhea with findings of leiomyoma.  The patient's postoperative course was noted for hypertension, noting a  preoperative blood pressure of 160/90.  Her postoperative blood  pressures were running in the 140-160/90-103 range.  The patient is not  known to be hypertensive and has had normal blood pressures on routine  preoperative and annual visits.  The patient is asymptomatic without  headaches, visual changes.  Overall, feels well.  I reviewed this with  her and recommended that we monitor at present.  I think this is a  postoperative anxiety and pain and will monitor at present.  Signs and  symptoms of hypertension were reviewed with her.  I have asked her to  come back to the office the week following discharge for a recheck of  her blood pressure and then in two weeks for a routine post op check.  Otherwise, she was doing well, ambulating well, tolerating a regular  diet, voiding without difficulty and passing flatus.  Her postoperative  hemoglobin was 11.  The patient received precautions, instructions and  follow up and a prescription for Demerol 50 mg, #30, one to two p.o.  q.6h. p.r.n. pain      Timothy P. Fontaine, M.D.  Electronically Signed     TPF/MEDQ  D:  02/20/2008  T:  02/20/2008  Job:  [id number]

## 2011-03-28 NOTE — Op Note (Signed)
Deanna George, Deanna George                 ACCOUNT NO.:  [id number]   MEDICAL RECORD NO.:  [id number]          PATIENT TYPE:  AMB   LOCATION:  [facility]                         FACILITY:  [facility]   PHYSICIAN:  Timothy P. Fontaine, M.D.DATE OF BIRTH:  [birth year]   DATE OF PROCEDURE:  02/19/2008  DATE OF DISCHARGE:                               OPERATIVE REPORT   PREOPERATIVE DIAGNOSES:  1. Increasing dysmenorrhea.  2. Menorrhagia.  3. Leiomyoma.   POSTOPERATIVE DIAGNOSES:  1. Increasing dysmenorrhea.  2. Menorrhagia.  3. Leiomyoma.   PROCEDURE:  Laparoscopic-assisted vaginal hysterectomy.   SURGEON:  Timothy P. Fontaine, M.D.   ASSISTANTGaetano Hawthorne. Lily Peer, M.D.   ANESTHETIC:  General.   ESTIMATED BLOOD LOSS:  100 mL.   INTRAOPERATIVE URINE OUTPUT:  75 mL.   COMPLICATIONS:  None.   SPECIMEN:  Uterus to Pathology.   FINDINGS:  EUA:  External, BUS and vagina normal.  Cervix normal.  Uterus grossly normal in size, irregular, consistent with leiomyoma.  Adnexa without masses.  Laparoscopic:  Uterus enlarged with irregularity  of contour, consistent with leiomyoma.  Right and left ovaries grossly  normal.  Right and left fallopian tubes grossly normal.  No evidence of  pelvic endometriosis or adhesions.  Upper abdominal exam is normal,  noting appendix free and mobile, liver smooth, no abnormalities,  gallbladder grossly normal to limited inspection.   PROCEDURE:  The patient was taken to the operating room, underwent  general anesthesia, was placed in the low dorsal lithotomy position,  received an abdominal, perineal and vaginal preparation with Betadine  solution, bladder emptied with an indwelling Foley catheterization  placed in sterile technique, EUA performed and a Hulka tenaculum was  placed through the cervix.  The patient was draped in the usual fashion.  A vertical infraumbilical incision was made.  The 10-mm Optiview trocar  was placed under direct visualization without  difficulty and the abdomen  was subsequently insufflated.  Right and left 5-mm suprapubic ports were  then placed under direct visualization after transillumination for the  vessels without difficulty and examination of the pelvic organs and  upper abdominal exam were carried out with the findings noted above.  Using the harmonic scalpel, the right and left uterine and ovarian  pedicles were transected and the uterus was progressively freed through  bilateral transection of the round ligaments, parametrial tissues and  subsequently the anterior vesicouterine peritoneal fold was sharply  incised using the harmonic scalpel without difficulty.  The bladder flap  was sharply developed and subsequently, attention was then turned to the  vaginal portion of the case.  The patient was placed in high dorsal  lithotomy position.  A weighted speculum was placed, the Hulka tenaculum  removed, the cervix visualized and circumferentially injected using 1%  lidocaine with epinephrine mixture, a total of 9 mL.  The cervical  mucosa was then circumferentially sharply incised and the paracervical  planes were sharply developed.  The posterior cul-de-sac was then  sharply entered without difficulty, a long weighted speculum placed and  the right and left uterosacral ligaments were identified, clamped, cut  and ligated using 0 Vicryl suture and tagged for future reference.  The  anterior vesicouterine plane was sharply developed and ultimately the  anterior cul-de-sac entered and the uterus was then progressively freed  from its attachments through clamping, cutting and ligating of the  paracervical cardinal ligaments and the parametrial tissues.  After  ligation of the uterine vessels bilaterally, due to the bulk of the  uterus, the uterus was morcellated to ultimately deliver it through the  vagina.  The remaining parametrial attachments were clamped, cut and  ligated using 0 Vicryl suture and the specimen  was completely excised.  The long weighted speculum was replaced with a shorter weighted  speculum.  The anterior vaginal cuff was grasped with an Allis clamp and  the posterior vaginal cuff mucosa was run from uterosacral ligament to  uterosacral ligament through a running interlocking stitch using 0  Vicryl suture.  The cul-de-sac was irrigated; adequate hemostasis was  visualized.  A tail sponge that was placed initially to aid in  visualization of the cul-de-sac was removed and the vagina was closed  anterior to posterior using 0 Vicryl suture in a figure-of-eight  interrupted suture.  The vagina was irrigated, the patient placed in a  low dorsal lithotomy position and attention was redirected to the  laparoscopic portion after regloving by the surgical team.  The abdomen  was reinsufflated.  The cul-de-sac was inspected; several small bleeding  points were addressed with the bipolar cautery.  Subsequently, the gas  was allowed to escape slowly.  All incision sites were reinspected and  pedicles reinspected under a low pressure situation, showing adequate  hemostasis.  The 5-mm suprapubic ports were then removed, hemostasis  visualized.  The infraumbilical port was then backed out under direct  visualization, showing adequate hemostasis and no evidence of hernia  formation.  A 0 Vicryl interrupted subcutaneous fascial stitch was  placed infraumbilically, all port sites injected using 0.25% Marcaine  and all skin sites were closed using 3-0 chromic in interrupted  cuticular stitch.  The patient was placed in the supine position.  Clear  yellow urine was noted.  The patient was awakened without difficulty and  taken to the recovery room in good condition, having tolerated the  procedure well.      Timothy P. Fontaine, M.D.  Electronically Signed     TPF/MEDQ  D:  02/19/2008  T:  02/19/2008  Job:  [id number]

## 2011-03-31 NOTE — H&P (Signed)
NAMESOPHY, MESLER                 ACCOUNT NO.:  [id number]   MEDICAL RECORD NO.:  [id number]        PATIENT TYPE:  HAMB   LOCATION:                               FACILITY:  [facility]   PHYSICIAN:  Timothy P. Fontaine, M.D.DATE OF BIRTH:  1966-10-14   DATE OF ADMISSION:  DATE OF DISCHARGE:                              HISTORY & PHYSICAL   To be admitted to [facility] for day surgery March 5 at  8:30.  Does not have a hospital number yet.   CHIEF COMPLAINT:  Increasing dysmenorrhea, pelvic pain, menorrhagia.   HISTORY OF PRESENT ILLNESS:  A 45 year old G3, P2, AB 1 female on Yaz  oral contraceptives who presents complaining of worsening menses,  increasing dysmenorrhea, and menorrhagia.  She has a past history of  laparoscopic proven endometriosis; and is admitted, at this time, for  laparoscopy and hysteroscopy.  Outpatient evaluation includes an  ultrasound which does show multiple small myomas largest measuring 24  mm, thin endometrial stripe, and normal-appearing ovaries.   PAST MEDICAL HISTORY SIGNIFICANT:  For interstitial cystitis.   PAST SURGICAL HISTORY:  1. Laparoscopy in 2005.  2. Cesarean section x2.  3. D&C x2.   CURRENT MEDICATIONS:  Yaz oral contraceptives.   ALLERGIES:  No medications.   REVIEW OF SYSTEMS:  Noncontributory.   FAMILY HISTORY:  Noncontributory.   SOCIAL HISTORY:  Noncontributory.   ADMISSION PHYSICAL EXAM:  VITAL SIGNS:  Afebrile.  Vital signs stable.  HEENT:  Normal.  LUNGS:  Clear.  CARDIAC:  Regular rate without rubs, murmurs or gallops.  ABDOMINAL EXAM:  Benign.  PELVIC:  External BUS, vagina normal.  Cervix normal.  Uterus grossly  normal in size, shape, midline, and mobile, nontender.  Adnexa without  masses or tenderness.   ASSESSMENT:  A 45 year old G3, P2, AB 1 female, oral contraceptives,  worsening menses, increasing dysmenorrhea, and menorrhagia.  Ultrasound  shows multiple small intramural myomas.  Cavity  appears to be small  normal.  Ovaries are normal on ultrasound.  The options for management  were reviewed.  The patient wants to proceed with laparoscopy and  hysteroscopy, rule out recurrent endometriosis other pathology.  The  proposed surgery expected intraoperative and postoperative courses;  acute intraoperative postoperative risks, and long-term issues all  reviewed with the patient.  The patient understands clearly there are no  guarantees as far as her pain or bleeding relief is concerned with these  small multiple myomas; that they certainly may be the cause of her pain,  although certainly endometriosis may also be a contributing or sole  factor.  She understands that we are not addressing the myomas and that  they all appear to be intramural on ultrasound; and that her  pain/bleeding may persist, worsen, or change following the procedure.  She understands what is involved with hysteroscopy/laparoscopy to  include instrumentation, use of the hysteroscope, laparoscopic, trocar  placement, insufflation, multiple port sites, use of sharp/blunt  dissection, electrocautery, and the use of the laser.   The risks of the procedure were reviewed to include the risks of  infection requiring prolonged  antibiotics, the risk of incisional  complications requiring opening and draining of incisions, closure by  secondary intention, the long-term risks of hernia formation, the risks  of hemorrhage necessitating transfusion, and the risks of transfusion  including transfusion reaction, hepatitis, HIV, mad cow disease, and  other unknown entities were all discussed, understood, and accepted.  The risks of inadvertent injury to internal organs either  laparoscopically including bowel, bladder, ureters, vessels and nerves;  or hysteroscopically with uterine perforation, and damage to internal  organs, necessitating major exploratory reparative surgeries, future  reparative surgeries; including  bowel resection, ostomy formation all  discussed, understood, and accepted.  The patient's questions are  answered to her satisfaction; and she is ready to proceed with surgery      Timothy P. Fontaine, M.D.  Electronically Signed     TPF/MEDQ  D:  12/24/2006  T:  12/24/2006  Job:  [id number]

## 2011-03-31 NOTE — Op Note (Signed)
   NAME:  Deanna George, Deanna George                           ACCOUNT NO.:  [id number]   MEDICAL RECORD NO.:  [id number]                   PATIENT TYPE:  AMB   LOCATION:  [facility]                                  FACILITY:  [facility]   PHYSICIAN:  Lenoard Aden, M.D.             DATE OF BIRTH:  January 30, 1966   DATE OF PROCEDURE:  01/08/2003  DATE OF DISCHARGE:                                 OPERATIVE REPORT   PREOPERATIVE DIAGNOSES:  A 9-week intrauterine pregnancy for elective  termination.   POSTOPERATIVE DIAGNOSES:  A 9-week intrauterine pregnancy for elective  termination.   PROCEDURE:  1. Examination under anesthesia.  2. Suction dilatation and evacuation.   SURGEON:  Lenoard Aden, M.D.   ANESTHESIA:  MAC, paracervical.   ESTIMATED BLOOD LOSS:  Less than 50 mL.   COMPLICATIONS:  None.   SPECIMENS:  Products of conception to pathology.   BRIEF OPERATIVE NOTE:  After being apprised of the risks of anesthesia,  infection, bleeding, uterine perforation, possible need for repair, post  abortal regret, discussed with the patient.  She acknowledges and wishes to  proceed.  After achieving adequate IV sedation examination under anesthesia  reveals a 9-week mid positioned uterus, no adnexal masses.  Paracervical  block placed using a dilute Xylocaine solution 20 mL total.  Uterus sounded  up to 12 cm.  Dilated easily up to number 27 Pratt dilator.  A 9 mm suction  curette placed.  Products of conception aspirated and noted.  Repeat suction  and curettage in a four quadrant method reveals cavity to be empty.  Good  hemostasis noted.  All instruments are removed.  Examination under  anesthesia reveals an 8-week sized uterus and no adnexal masses.  The  patient is awakened and transferred to recovery in good condition.                                               Lenoard Aden, M.D.    RJT/MEDQ  D:  01/08/2003  T:  01/08/2003  Job:  [id number]

## 2011-03-31 NOTE — Op Note (Signed)
NAMEMARIAISABEL, Deanna George                 ACCOUNT NO.:  [id number]   MEDICAL RECORD NO.:  [id number]          PATIENT TYPE:  AMB   LOCATION:  [facility]                         FACILITY:  [facility]   PHYSICIAN:  Timothy P. Fontaine, M.D.DATE OF BIRTH:  July 11, 1966   DATE OF PROCEDURE:  01/16/2007  DATE OF DISCHARGE:                               OPERATIVE REPORT   PREOPERATIVE DIAGNOSES:  1. Increasing dysmenorrhea.  2. Menorrhagia.  3. History leiomyomata.  4. History of endometriosis.   POSTOPERATIVE DIAGNOSES:  1. Increasing dysmenorrhea.  2. Menorrhagia.  3. History leiomyomata.  4. History of endometriosis.   PROCEDURE:  1. Hysteroscopic submucous myomectomy.  2. Laparoscopic biopsy and fulguration of endometriosis.  3. Lysis of adhesions.   SURGEON:  Timothy P. Fontaine, M.D.   ANESTHETIC:  1. General.  2. Paracervical block, 1% lidocaine.  3. 0.25% Marcaine incisional infiltration.   SPECIMEN:  1. Submucous myoma fragments.  2. Peritoneal biopsy.   ESTIMATED BLOOD LOSS:  Minimal.   COMPLICATIONS:  None.   SORBITOL DISCREPANCY:  Unmeasurable with 410 mL reported as deficit on  machine, large amount on the floor under the OR table.   COMPLICATIONS:  None.   FINDINGS:  EUA:  External, BUS and vagina normal.  Cervix normal.  Uterus mildly irregular, grossly normal in size, midline and mobile.  Adnexa without masses.  Hysteroscopic:  Small submucous myoma, posterior  right cavity, excised in its entirety.  Hysteroscopic exam otherwise  normal, noting fundus, anterior and posterior uterine surfaces, lower  uterine segment, endocervical canal, right and left tubal ostia all  visualized and normal.  Laparoscopic:  Anterior cul-de-sac with  vesicouterine fold adhesions to the anterior uterine surface, sharply  lysed.  Two small red patch areas consistent with red endometriosis.  Right vesicle peritoneal fold, representative biopsy taken, both areas  fulgurated.  Posterior  cul-de-sac grossly normal.  Uterus grossly normal  in size, contour irregular, consistent with myomas, noting a fundal  anterior submucous myoma distorting contour.  Right and left fallopian  tubes of normal length and caliber, fimbriated ends free and mobile.  Right and left ovaries grossly normal, free and mobile.  Upper abdominal  exam is normal, noting liver smooth, no abnormalities, gallbladder  grossly normal.  Appendix visualized and normal, free and mobile.   PROCEDURE:  The patient was taken to the operating room, underwent  general anesthesia, was placed in the low dorsal lithotomy position,  received an abdominal/perineal/vaginal preparation with Betadine  solution, bladder emptied with an indwelling Foley catheter placed in  sterile technique and an EUA was performed.  The patient was draped in  the usual fashion, cervix visualized with a speculum, anterior lip  grasped with single-tooth tenaculum and paracervical block using 1%  lidocaine was placed, a total of 8 mL.  Cervix was gently dilated to  admit the diagnostic hysteroscope with hysteroscopy performed, findings  noted above.  The hysteroscope was switched to the operative  hysteroscope and after gently and gradual dilatation to allow placement  of the operative hysteroscope, the small submucous myoma was resected in  several passes using  the right-angle resectoscopic loop.  The  instruments were then removed.  There was good distention and no  evidence of perforation throughout the case.  A Hulka tenaculum was then  placed on the cervix for manipulation and attention was then turned to  the laparoscopic portion of the procedure.  The surgeon regloved and a  vertical infraumbilical incision was made.  The OptiVu-type direct entry  10-mm trocar was then placed under direct visualization without  difficulty and the abdomen was then insufflated.  A 5-mm suprapubic port  was then placed under direct visualization after  transillumination for  the vessels without difficulty.  Examination of the pelvic organs and  upper abdominal exam were then carried out with findings noted above.  A  biopsy was then taken of the anterior peritoneal red-appearing  endometriosis and both red areas were then bipolar-cauterized.  The  vesicouterine peritoneal fold adhesions were then sharply lysed to free  up this adhesive fold.  The pelvis was copiously irrigated.  Adequate  hemostasis was visualized.  The 5-mm port removed, the gas slowly  allowed to escape.  The infraumbilical port was then backed out under  direct visualization, noting hemostasis at all surgical sites.  The 5-mm  port site and the umbilicus showed adequate hemostasis, no evidence of  hernia formation.  Both skin incisions were injected using 0.25%  Marcaine and a 0 Vicryl interrupted subcutaneous fascial stitch placed  infraumbilically, both skin incisions closed using Dermabond skin  adhesive.  Hulka tenaculum removed, adequate vaginal hemostasis noted,  the Foley catheter removed.  The patient awakened without difficulty  after being placed in the supine position and taken to the recovery room  in good condition, having tolerated the procedure well.  Of note, the  deficit for the hysteroscopic portion was noted to be 410 mL, although  at the end of the case, it was noted a large volume of sorbitol was on  the OR floor and the realistic estimated deficit was minimal.      Timothy P. Fontaine, M.D.  Electronically Signed     TPF/MEDQ  D:  01/16/2007  T:  01/16/2007  Job:  [id number]

## 2011-03-31 NOTE — H&P (Signed)
NAME:  Deanna George, Deanna George                           ACCOUNT NO.:  [id number]   MEDICAL RECORD NO.:  [id number]                   PATIENT TYPE:  AMB   LOCATION:  [facility]                                  FACILITY:  [facility]   PHYSICIAN:  Lenoard Aden, M.D.             DATE OF BIRTH:  1966/01/16   DATE OF ADMISSION:  01/08/2003  DATE OF DISCHARGE:                                HISTORY & PHYSICAL   CHIEF COMPLAINT:  Pelvic pain and desire for elective termination.   HISTORY OF PRESENT ILLNESS:  The patient is 45 year old white female G2 P2,  history of previous C section x2, who presented initially with first  trimester bleeding with a known nine-week intrauterine pregnancy at this  time - it is mostly subchorionic hemorrhage - who desires to terminate  pregnancy due to concerns regarding the normalcy of her pregnancy.  The  patient stated that at the time of her first trimester bleeding she noted  that she was concerned about the outcome of the pregnancy despite normal  sonographic follow-up and reassurance the patient wishes to proceed.  She  has been sent for urologic follow-up which was normal, since she had  hematuria at that time.  She has been counseled extensively about this issue  and the possibility of postabortal regret and the issue to report that there  was no evidence that there are any abnormalities ongoing with her pregnancy.   PAST MEDICAL HISTORY:  Remarkable for some vertebral pain which has been  responsive to chiropractic therapy.  She has had two previous C sections -  one in 2000 and one previous to that time in 1998, an uncomplicated  miscarriage in 1999, history of abnormal Pap smear in 1998 without therapy.  History of irritable bowel syndrome.  Questionable history of hypoglycemia.   FAMILY HISTORY:  Noncontributory.   SOCIAL HISTORY:  She is a nonsmoker, nondrinker.  She denies domestic or  physical violence.  Husband has been supportive and has been present for  all  office visits.   LABORATORY DATA:  Blood type is A positive, Rh antibody is negative.   PHYSICAL EXAMINATION:  GENERAL:  The patient is an anxious-appearing white  female, no obvious distress.  HEENT:  Normal.  LUNGS:  Clear.  HEART:  Regular rhythm.  ABDOMEN:  Soft, nontender.  PELVIC:  Reveals an eight- to ten-week size uterus and no adnexal masses.   IMPRESSION:  1. Nine-week intrauterine pregnancy with desire for elective termination.     The patient with unresolved pelvic pain, normal urologic workup, normal     pelvic ultrasound, no evidence of etiology for pain.    PLAN:  Proceed with suction D&E per patient request.  Risks of anesthesia,  infection, bleeding, uterine perforation, need for repair, risk of  postabortal regret discussed extensively with this patient.  She  acknowledges and wishes to proceed.  Lenoard Aden, M.D.    RJT/MEDQ  D:  01/08/2003  T:  01/08/2003  Job:  [id number]

## 2011-06-12 ENCOUNTER — Other Ambulatory Visit: Payer: Self-pay | Admitting: Gynecology

## 2011-06-12 DIAGNOSIS — Z1231 Encounter for screening mammogram for malignant neoplasm of breast: Secondary | ICD-10-CM

## 2011-06-29 ENCOUNTER — Ambulatory Visit
Admission: RE | Admit: 2011-06-29 | Discharge: 2011-06-29 | Disposition: A | Discharge status: Home / Self Care | Payer: BC Managed Care – PPO | Source: Ambulatory Visit | Attending: Gynecology | Admitting: Gynecology

## 2011-06-29 DIAGNOSIS — Z1231 Encounter for screening mammogram for malignant neoplasm of breast: Secondary | ICD-10-CM

## 2011-07-05 ENCOUNTER — Ambulatory Visit (INDEPENDENT_AMBULATORY_CARE_PROVIDER_SITE_OTHER): Payer: BC Managed Care – PPO | Admitting: Gastroenterology

## 2011-07-05 ENCOUNTER — Encounter: Payer: Self-pay | Admitting: Gastroenterology

## 2011-07-05 VITALS — BP 128/64 | HR 76 | Ht 65.0 in | Wt 188.0 lb

## 2011-07-05 DIAGNOSIS — K6289 Other specified diseases of anus and rectum: Secondary | ICD-10-CM

## 2011-07-05 DIAGNOSIS — K921 Melena: Secondary | ICD-10-CM

## 2011-07-05 DIAGNOSIS — K219 Gastro-esophageal reflux disease without esophagitis: Secondary | ICD-10-CM

## 2011-07-05 MED ORDER — AMBULATORY NON FORMULARY MEDICATION: Status: DC

## 2011-07-05 MED ORDER — OMEPRAZOLE 40 MG PO CPDR: 40.0000 mg | DELAYED_RELEASE_CAPSULE | Freq: Every day | ORAL | Status: DC

## 2011-07-05 NOTE — Progress Notes (Signed)
History of Present Illness: This is a 45 year old female who has had persistent problems with bright red blood per rectum associated with bowel movements. She describes frequent anal pain with bowel movements described like "glass cutting into her rectum". Her symptoms have been bothersome for approximately one year and have worsened over the past few months.  Her reflux symptoms are well controlled on omeprazole 40 mg daily but poorly controlled on 20 mg daily. Her irritable bowel syndrome is not active. Denies weight loss, abdominal pain, constipation, diarrhea, change in stool caliber, melena, nausea, vomiting, dysphagia, chest pain.  Current Medications, Allergies, Past Medical History, Past Surgical History, Family History and Social History were reviewed in [employer] record.  Physical Exam: General: Well developed , well nourished, no acute distress Head: Normocephalic and atraumatic Eyes:  sclerae anicteric, EOMI Ears: Normal auditory acuity Mouth: No deformity or lesions Lungs: Clear throughout to auscultation Heart: Regular rate and rhythm; no murmurs, rubs or bruits Abdomen: Soft, non tender and non distended. No masses, hepatosplenomegaly or hernias noted. Normal Bowel sounds Rectal: Small external hemorrhoidal times no internal lesions, mild anal canal tenderness, Hemoccult negative stool  Musculoskeletal: Symmetrical with no gross deformities  Pulses:  Normal pulses noted Extremities: No clubbing, cyanosis, edema or deformities noted Neurological: Alert oriented x 4, grossly nonfocal Psychological:  Alert and cooperative. Normal mood and affect  Assessment and Recommendations:  1. Frequent hematochezia associated with anal pain. Rule out anal fissure and internal hemorrhoids. Small external hemorrhoids are an unlikely cause of her symptoms. Begin diltiazem cream 3 times a day and standard rectal care instructions. Schedule flexible sigmoidoscopy. The  risks, benefits, and alternatives to sigmoidoscopy with possible biopsy, possible destruction of internal hemorrhoids and possible polypectomy were discussed with the patient and they consent to proceed.   2. GERD. Resume omeprazole 40 mg daily in standard and reflux measures.

## 2011-07-05 NOTE — Patient Instructions (Addendum)
Please call us back to schedule your Flexible Sigmoidoscopy.  Your Diltiazem cream and omeprazole has been sent to [facility].  Rectal care instructions given.  cc: Maryelizabeth Rowan, MD

## 2011-07-06 ENCOUNTER — Ambulatory Visit (AMBULATORY_SURGERY_CENTER): Payer: BC Managed Care – PPO | Admitting: *Deleted

## 2011-07-06 ENCOUNTER — Encounter: Payer: Self-pay | Admitting: Gastroenterology

## 2011-07-06 VITALS — Ht 64.0 in | Wt 188.0 lb

## 2011-07-06 DIAGNOSIS — K921 Melena: Secondary | ICD-10-CM

## 2011-07-06 DIAGNOSIS — K6289 Other specified diseases of anus and rectum: Secondary | ICD-10-CM

## 2011-07-14 ENCOUNTER — Telehealth: Payer: Self-pay | Admitting: Gastroenterology

## 2011-07-14 ENCOUNTER — Encounter: Payer: Self-pay | Admitting: Gastroenterology

## 2011-07-14 ENCOUNTER — Ambulatory Visit (AMBULATORY_SURGERY_CENTER): Payer: BC Managed Care – PPO | Admitting: Gastroenterology

## 2011-07-14 ENCOUNTER — Encounter: Payer: Self-pay | Admitting: *Deleted

## 2011-07-14 DIAGNOSIS — K6289 Other specified diseases of anus and rectum: Secondary | ICD-10-CM

## 2011-07-14 DIAGNOSIS — K921 Melena: Secondary | ICD-10-CM

## 2011-07-14 MED ORDER — SODIUM CHLORIDE 0.9 % IV SOLN: 500.0000 mL | INTRAVENOUS | Status: DC

## 2011-07-14 NOTE — Patient Instructions (Signed)
Anal fissure Small internal hemorrhoids Call office to schedule appointment for 6 weeks [phone]  Continue Dilitiazem cream three times daily for 6 weeks

## 2011-07-14 NOTE — Progress Notes (Signed)
Pt in admitting c/o headache pain.  She rates it 6 out of 10.  Asked if she could have something for pain before the procedure.  Per Dr Russella Dar,  Will go ahead and move pt to procedure room and room nurse will give 25 mcg IV.  Annitta Jersey made aware.  Pt moved to procedure room to start meds.    1327-Pt administered Fentanyl for migraine headache per MD verbal order  1343-Pt states that her headache is some better, not completely relieved. MD at bedside, will start procedure.

## 2011-07-14 NOTE — Telephone Encounter (Signed)
Pt phoned before she left home for procedure.  Apologized to pt for no return phone call and that we did not receive note until she had arrived.  She wanted to know about taking migraine meds.  She is c/o pain 6/10.   Per Dr Russella Dar we were to give her Fentanyl 25 mcg IV  prior to procedure.

## 2011-07-18 ENCOUNTER — Telehealth: Payer: Self-pay | Admitting: *Deleted

## 2011-07-18 NOTE — Telephone Encounter (Signed)
No ID on voice mail.  No message left at this time. 

## 2011-08-08 ENCOUNTER — Other Ambulatory Visit: Payer: Self-pay | Admitting: Dermatology

## 2011-08-08 LAB — CBC
HCT: 31.7 — ABNORMAL LOW
Hemoglobin: 11.2 — ABNORMAL LOW
MCHC: 35.3
MCV: 85.6
Platelets: 177
RBC: 3.7 — ABNORMAL LOW
RDW: 13.1
WBC: 9.4

## 2011-08-15 LAB — POCT HEMOGLOBIN-HEMACUE: Hemoglobin: 14.3

## 2011-08-29 ENCOUNTER — Ambulatory Visit: Payer: BC Managed Care – PPO | Admitting: Gastroenterology

## 2011-09-01 ENCOUNTER — Encounter: Payer: Self-pay | Admitting: Nurse Practitioner

## 2011-09-01 ENCOUNTER — Other Ambulatory Visit: Payer: Self-pay | Admitting: *Deleted

## 2011-09-01 MED ORDER — LOSARTAN POTASSIUM 50 MG PO TABS: 50.0000 mg | ORAL_TABLET | Freq: Every day | ORAL | Status: DC

## 2011-09-05 ENCOUNTER — Encounter: Payer: Self-pay | Admitting: Nurse Practitioner

## 2011-09-05 ENCOUNTER — Ambulatory Visit (INDEPENDENT_AMBULATORY_CARE_PROVIDER_SITE_OTHER): Payer: BC Managed Care – PPO | Admitting: Nurse Practitioner

## 2011-09-05 DIAGNOSIS — R5383 Other fatigue: Secondary | ICD-10-CM

## 2011-09-05 DIAGNOSIS — I517 Cardiomegaly: Secondary | ICD-10-CM

## 2011-09-05 DIAGNOSIS — R5381 Other malaise: Secondary | ICD-10-CM

## 2011-09-05 DIAGNOSIS — I10 Essential (primary) hypertension: Secondary | ICD-10-CM | POA: Insufficient documentation

## 2011-09-05 LAB — HEPATIC FUNCTION PANEL
ALT: 14 U/L (ref 0–35)
AST: 18 U/L (ref 0–37)
Albumin: 4.3 g/dL (ref 3.5–5.2)
Alkaline Phosphatase: 65 U/L (ref 39–117)
Bilirubin, Direct: 0 mg/dL (ref 0.0–0.3)
Total Bilirubin: 0.4 mg/dL (ref 0.3–1.2)
Total Protein: 7.3 g/dL (ref 6.0–8.3)

## 2011-09-05 LAB — LIPID PANEL
Cholesterol: 170 mg/dL (ref 0–200)
HDL: 46.9 mg/dL (ref >39.00)
LDL Cholesterol: 102 mg/dL — ABNORMAL HIGH (ref 0–99)
Total CHOL/HDL Ratio: 4
Triglycerides: 104 mg/dL (ref 0.0–149.0)
VLDL: 20.8 mg/dL (ref 0.0–40.0)

## 2011-09-05 LAB — BASIC METABOLIC PANEL
BUN: 11 mg/dL (ref 6–23)
CO2: 24 mEq/L (ref 19–32)
Calcium: 8.8 mg/dL (ref 8.4–10.5)
Chloride: 104 mEq/L (ref 96–112)
Creatinine, Ser: 0.7 mg/dL (ref 0.4–1.2)
GFR: 105.51 mL/min (ref >60.00)
Glucose, Bld: 100 mg/dL — ABNORMAL HIGH (ref 70–99)
Potassium: 3.9 mEq/L (ref 3.5–5.1)
Sodium: 137 mEq/L (ref 135–145)

## 2011-09-05 LAB — TSH: TSH: 0.38 u[IU]/mL (ref 0.35–5.50)

## 2011-09-05 MED ORDER — ASPIRIN EC 81 MG PO TBEC: 81.0000 mg | DELAYED_RELEASE_TABLET | Freq: Every day | ORAL | Status: AC

## 2011-09-05 MED ORDER — LOSARTAN POTASSIUM 50 MG PO TABS: 50.0000 mg | ORAL_TABLET | Freq: Every day | ORAL | Status: DC

## 2011-09-05 NOTE — Patient Instructions (Addendum)
You need to take your Losartan every day. Your goal is less than 135/85 consistently.  Obtain your own blood pressure cuff (Omron) Keep a diary of your readings  Please take a coated baby aspirin daily.  I need to see you in a week.  We are going to check an ultrasound of your heart

## 2011-09-05 NOTE — Progress Notes (Signed)
[name] Date of Birth: 1966-10-06 Medical Record #[id number]  History of Present Illness: Redina is seen today after an approximate 2 1/2 year absence. She is seen for Dr. Antoine Poche. She is a former patient of Dr. Ronnald Nian. She has HTN with known mild LVH. Last echo in 2009. She was last seen in March of 2010. She comes in today and reports that she has been a "poor patient". She is only sporadically taking her Losartan. She has had none for over one week. She is worried about her insurance company providing coverage for her when she is on blood pressure medicine. So, she has basically not been taking it. She says her blood pressure has been up to 200 systolic while not taking the Losartan but down to 115's when on her Losartan. She remains very anxious. She is chronically fatigued. She is divorcing and remains under a lot of stress that has been chronic. No chest pain. She tries to exercise some but notes that her heart "starts flying". No syncope. Not really lightheaded or dizzy.   Current Outpatient Prescriptions on File Prior to Visit  Medication Sig Dispense Refill  . cyclobenzaprine (FLEXERIL) 10 MG tablet Take 10 mg by mouth 3 (three) times daily as needed.        Marland Kitchen HYDROcodone-acetaminophen (LORTAB) 7.5-500 MG per tablet Take 1 tablet by mouth every 6 (six) hours as needed.        Marland Kitchen omeprazole (PRILOSEC) 40 MG capsule Take 1 capsule (40 mg total) by mouth daily.  30 capsule  11  . Promethazine HCl (PHENERGAN PO) Take by mouth as needed.       . AMBULATORY NON FORMULARY MEDICATION Diltiazem 2% cream to use rectally three times a day  30 g  0  . losartan (COZAAR) 50 MG tablet Take 1 tablet (50 mg total) by mouth daily.  15 tablet  0    No Known Allergies  Past Medical History  Diagnosis Date  . Hypertension   . Interstitial cystitis   . Pneumonia   . Urinary tract infection   . GERD (gastroesophageal reflux disease)   . Esophageal stricture   . IBS (irritable bowel syndrome)     . Hemorrhoids   . Anal fissure   . Palpitations   . Syncope and collapse   . Dizziness   . OA (osteoarthritis) of knee   . Cervical disc disease     Past Surgical History  Procedure Date  . Laparoscopic vaginal hysterectomy     02/2008  . Laparoscopic surgery 01/2007    uterus and ovary x 2  . Cesarean section [id number]    x2  . Foot pin post fracture 09/2008    Left foot  . Colonoscopy   . Knee arthroscopy 2011     right  . US echocardiography 07/31/2008    EF 55-60%    History  Smoking status  . Never Smoker   Smokeless tobacco  . Never Used    History  Alcohol Use No    Family History  Problem Relation Age of Onset  . Adopted: Yes    Review of Systems: The review of systems is positive for significant fatigue. .  All other systems were reviewed and are negative.  Physical Exam: BP 168/110  Pulse 74  Ht 5\' 4"  (1.626 m)  Wt 183 lb 6.4 oz (83.19 kg)  BMI 31.48 kg/m2 Patient is somewhat anxious but in no acute distress. She is obese. Skin is warm  and dry. Color is normal.  HEENT is unremarkable. Normocephalic/atraumatic. PERRL. Sclera are nonicteric. Neck is supple. No masses. No JVD. Lungs are clear. Cardiac exam shows a regular rate and rhythm. Abdomen is soft. Extremities are without edema. Gait and ROM are intact. No gross neurologic deficits noted.   LABORATORY DATA: PENDING   Assessment / Plan:

## 2011-09-05 NOTE — Assessment & Plan Note (Addendum)
We spent 30 minutes discussing her blood pressure today. Blood pressure is not controlled. She has had no medicines for over one week. She is very resistant to the idea of taking medicine long term. I advised her that we need to just focus on the short term for now and that that will involve medicines to reduce risk for long term problems. I explained that once we get her blood pressure control, then we could focus on weight loss and exercise and that maybe some time in the future she will not need medicine. For now, she needs to get back on her Losartan. She says she is willing to take the Losartan. I have refilled it today.   I explained that she may need additional medicines for optimal control. She is adamant that her blood pressure is normal when she takes just the Losartan. She needs a repeat echo.I will see her back in 1 week. We will check complete labs today. I have asked her to take a baby aspirin daily as well. Patient is agreeable to this plan and will call if any problems develop in the interim.

## 2011-09-07 ENCOUNTER — Ambulatory Visit (HOSPITAL_COMMUNITY): Payer: BC Managed Care – PPO | Attending: Internal Medicine

## 2011-09-07 DIAGNOSIS — R5381 Other malaise: Secondary | ICD-10-CM | POA: Insufficient documentation

## 2011-09-07 DIAGNOSIS — R5383 Other fatigue: Secondary | ICD-10-CM | POA: Insufficient documentation

## 2011-09-07 DIAGNOSIS — I379 Nonrheumatic pulmonary valve disorder, unspecified: Secondary | ICD-10-CM | POA: Insufficient documentation

## 2011-09-07 DIAGNOSIS — I079 Rheumatic tricuspid valve disease, unspecified: Secondary | ICD-10-CM | POA: Insufficient documentation

## 2011-09-07 DIAGNOSIS — I517 Cardiomegaly: Secondary | ICD-10-CM | POA: Insufficient documentation

## 2011-09-07 DIAGNOSIS — I10 Essential (primary) hypertension: Secondary | ICD-10-CM | POA: Insufficient documentation

## 2011-09-07 DIAGNOSIS — I059 Rheumatic mitral valve disease, unspecified: Secondary | ICD-10-CM | POA: Insufficient documentation

## 2011-09-07 DIAGNOSIS — R55 Syncope and collapse: Secondary | ICD-10-CM | POA: Insufficient documentation

## 2011-09-12 ENCOUNTER — Encounter: Payer: Self-pay | Admitting: Nurse Practitioner

## 2011-09-12 ENCOUNTER — Ambulatory Visit (INDEPENDENT_AMBULATORY_CARE_PROVIDER_SITE_OTHER): Payer: BC Managed Care – PPO | Admitting: Nurse Practitioner

## 2011-09-12 VITALS — BP 148/98 | HR 80 | Ht 64.0 in | Wt 182.0 lb

## 2011-09-12 DIAGNOSIS — I10 Essential (primary) hypertension: Secondary | ICD-10-CM

## 2011-09-12 MED ORDER — LOSARTAN POTASSIUM 50 MG PO TABS: 50.0000 mg | ORAL_TABLET | Freq: Two times a day (BID) | ORAL | Status: DC

## 2011-09-12 NOTE — Patient Instructions (Signed)
You may start back at the gym.  Increase your Losartan to twice a day.  I will see you in a month. We will check a potassium level on return. You do not need to fast.

## 2011-09-12 NOTE — Progress Notes (Signed)
Deanna George Date of Birth: [birth year] Medical Record #[id number]  History of Present Illness: Deanna George is seen today for a follow up visit. She is seen for Dr. Antoine Poche. She is a former patient of Dr. Ronnald Nian. She has HTN with mild LVH. She had basically been off of her medicine when I saw her earlier this month. She is now back on Losartan and taking it every day. She is on low dose aspirin. We had an in depth discussion regarding her overall health and how she needs to make it a priority. She is doing better. She feels better on her medicine and blood pressure has improved. She remains stressed with her kids and the divorce but overall seems more motivated to make changes.   Current Outpatient Prescriptions on File Prior to Visit  Medication Sig Dispense Refill  . aspirin EC 81 MG tablet Take 1 tablet (81 mg total) by mouth daily.  150 tablet  2  . cyclobenzaprine (FLEXERIL) 10 MG tablet Take 10 mg by mouth 3 (three) times daily as needed.        Marland Kitchen HYDROcodone-acetaminophen (LORTAB) 7.5-500 MG per tablet Take 1 tablet by mouth every 6 (six) hours as needed.        Marland Kitchen omeprazole (PRILOSEC) 40 MG capsule Take 1 capsule (40 mg total) by mouth daily.  30 capsule  11  . Promethazine HCl (PHENERGAN PO) Take by mouth as needed.       Marland Kitchen DISCONTD: losartan (COZAAR) 50 MG tablet Take 1 tablet (50 mg total) by mouth daily.  15 tablet  0  . AMBULATORY NON FORMULARY MEDICATION Diltiazem 2% cream to use rectally three times a day  30 g  0    No Known Allergies  Past Medical History  Diagnosis Date  . Hypertension   . Interstitial cystitis   . Pneumonia   . Urinary tract infection   . GERD (gastroesophageal reflux disease)   . Esophageal stricture   . IBS (irritable bowel syndrome)   . Hemorrhoids   . Anal fissure   . Palpitations   . OA (osteoarthritis) of knee   . Cervical disc disease   . Noncompliance   . Situational stress     Past Surgical History  Procedure Date  . Laparoscopic  vaginal hysterectomy     02/2008  . Laparoscopic surgery 01/2007    uterus and ovary x 2  . Cesarean section [id number]    x2  . Foot pin post fracture 09/2008    Left foot  . Colonoscopy   . Knee arthroscopy 2011     right  . US echocardiography 07/31/2008    EF 55-60% with mild LVH    History  Smoking status  . Never Smoker   Smokeless tobacco  . Never Used    History  Alcohol Use No    Family History  Problem Relation Age of Onset  . Adopted: Yes    Review of Systems: The review of systems is positive for situational stress. She does sleep poorly.  All other systems were reviewed and are negative.  Physical Exam: BP 148/98  Pulse 80  Ht 5\' 4"  (1.626 m)  Wt 182 lb (82.555 kg)  BMI 31.24 kg/m2 Patient is very pleasant and in no acute distress. Not as anxious today and seems more motivated. Skin is warm and dry. Color is normal.  HEENT is unremarkable. Normocephalic/atraumatic. PERRL. Sclera are nonicteric. Neck is supple. No masses. No JVD. Lungs are clear. Cardiac  exam shows a regular rate and rhythm. Abdomen is soft. Extremities are without edema. Gait and ROM are intact. No gross neurologic deficits noted.   LABORATORY DATA: Echo showed mild LVH with normal LV systolic function.    Assessment / Plan:

## 2011-09-12 NOTE — Assessment & Plan Note (Signed)
Blood pressure has come down. She feels better on the Losartan. We will increase to BID. I will see her in a month and check BMET on return. She is given the ok to go and start walking at the gym.  Patient is agreeable to this plan and will call if any problems develop in the interim.

## 2011-09-13 ENCOUNTER — Encounter: Payer: Self-pay | Admitting: *Deleted

## 2011-09-15 ENCOUNTER — Telehealth: Payer: Self-pay | Admitting: Nurse Practitioner

## 2011-09-15 ENCOUNTER — Other Ambulatory Visit: Payer: Self-pay | Admitting: Gastroenterology

## 2011-09-15 MED ORDER — OMEPRAZOLE 40 MG PO CPDR: 40.0000 mg | DELAYED_RELEASE_CAPSULE | Freq: Every day | ORAL | Status: DC

## 2011-09-15 NOTE — Telephone Encounter (Signed)
New message Pt was in office [phone] and was told to call back with med info  All her rx need to be called to [facility] [phone]  She needs refill of losartin she has only a few pills left  Please call

## 2011-09-15 NOTE — Telephone Encounter (Signed)
Printed and sent prescription to patients mail order pharmacy.

## 2011-09-21 ENCOUNTER — Other Ambulatory Visit: Payer: Self-pay | Admitting: *Deleted

## 2011-09-22 ENCOUNTER — Other Ambulatory Visit: Payer: Self-pay | Admitting: *Deleted

## 2011-09-22 MED ORDER — LOSARTAN POTASSIUM 50 MG PO TABS: 50.0000 mg | ORAL_TABLET | Freq: Two times a day (BID) | ORAL | Status: DC

## 2011-10-10 ENCOUNTER — Telehealth: Payer: Self-pay | Admitting: Gastroenterology

## 2011-10-10 NOTE — Telephone Encounter (Signed)
Patient states there has been a lot of confusion in her getting her omeprazole from her mail order pharmacy and has still not received it in the mail. She would really like use to call her mail order pharmacy to expedite this while she is out of medicine. Called mail order pharmacy and called in the prescription for omeprazole 40 mg one tablet by mouth daily. I also informed her that we do not carry samples of this medication so she will have to purchase this medication OTC until she receives the mail order. Pt agreed.

## 2011-11-24 DIAGNOSIS — G43709 Chronic migraine without aura, not intractable, without status migrainosus: Secondary | ICD-10-CM | POA: Insufficient documentation

## 2011-11-24 DIAGNOSIS — M5412 Radiculopathy, cervical region: Secondary | ICD-10-CM | POA: Insufficient documentation

## 2011-11-24 DIAGNOSIS — IMO0002 Reserved for concepts with insufficient information to code with codable children: Secondary | ICD-10-CM | POA: Insufficient documentation

## 2011-12-19 ENCOUNTER — Other Ambulatory Visit: Payer: Self-pay | Admitting: Dermatology

## 2012-05-28 DIAGNOSIS — F819 Developmental disorder of scholastic skills, unspecified: Secondary | ICD-10-CM | POA: Insufficient documentation

## 2012-05-28 DIAGNOSIS — L239 Allergic contact dermatitis, unspecified cause: Secondary | ICD-10-CM | POA: Insufficient documentation

## 2012-11-21 ENCOUNTER — Other Ambulatory Visit: Payer: Self-pay | Admitting: Cardiology

## 2012-11-21 MED ORDER — LOSARTAN POTASSIUM 50 MG PO TABS: 50.0000 mg | ORAL_TABLET | Freq: Two times a day (BID) | ORAL | Status: DC

## 2012-12-13 ENCOUNTER — Ambulatory Visit (INDEPENDENT_AMBULATORY_CARE_PROVIDER_SITE_OTHER): Payer: BC Managed Care – PPO | Admitting: Gastroenterology

## 2012-12-13 ENCOUNTER — Encounter: Payer: Self-pay | Admitting: Gastroenterology

## 2012-12-13 VITALS — BP 132/70 | HR 77 | Ht 64.5 in | Wt 185.0 lb

## 2012-12-13 DIAGNOSIS — K219 Gastro-esophageal reflux disease without esophagitis: Secondary | ICD-10-CM

## 2012-12-13 DIAGNOSIS — L290 Pruritus ani: Secondary | ICD-10-CM

## 2012-12-13 MED ORDER — HYDROCORTISONE ACE-PRAMOXINE 2.5-1 % RE CREA: TOPICAL_CREAM | Freq: Two times a day (BID) | RECTAL | Status: DC

## 2012-12-13 MED ORDER — OMEPRAZOLE 40 MG PO CPDR: 40.0000 mg | DELAYED_RELEASE_CAPSULE | Freq: Every day | ORAL | Status: DC

## 2012-12-13 NOTE — Patient Instructions (Addendum)
We have sent the following medications to your pharmacy for you to pick up at your convenience: Analpram  We have also sent your prescription of omeprazole to your mail order pharmacy and given samples of Prilosec for you take while waiting on the mailed medicine.  You have been given rectal care instructions.  cc: Maryelizabeth Rowan, MD

## 2012-12-13 NOTE — Progress Notes (Signed)
History of Present Illness: This is a 47 year old female accompanied by her daughter. She states her reflux symptoms have been active since she ran out of her prescription. She switched to Prilosec 20 mg daily which has not been effective in controlling her symptoms. She is several questions about GERD and her prior endoscopy. She relates frequent problems with anal itching and anal discomfort particularly when she has been seated for long periods of time. The symptoms tend to resolve over the course of a few days. She underwent flexible sigmoidoscopy in August 2012 showed an anal fissure and small internal hemorrhoids.  Current Medications, Allergies, Past Medical History, Past Surgical History, Family History and Social History were reviewed in [employer] record.  Physical Exam: General: Well developed , well nourished, no acute distress Head: Normocephalic and atraumatic Eyes:  sclerae anicteric, EOMI Ears: Normal auditory acuity Mouth: No deformity or lesions Lungs: Clear throughout to auscultation Heart: Regular rate and rhythm; no murmurs, rubs or bruits Abdomen: Soft, non tender and non distended. No masses, hepatosplenomegaly or hernias noted. Normal Bowel sounds Rectal: small ext tags, no internal lesions, no tenderness, heme - stool Musculoskeletal: Symmetrical with no gross deformities  Pulses:  Normal pulses noted Extremities: No clubbing, cyanosis, edema or deformities noted Neurological: Alert oriented x 4, grossly nonfocal Psychological:  Alert and cooperative. Anxious  Assessment and Recommendations:  1. GERD with a history of esophageal stricture, a 5 cm hiatal hernia and esophagitis. Symptoms active off prescription medication. Renew omeprazole 40 mg daily and follow standard antireflux measures.  2. Anal itching. Presumed hemorrhoidal symptoms. Standard rectal care instructions. Analpram cream.

## 2012-12-17 ENCOUNTER — Other Ambulatory Visit: Payer: Self-pay | Admitting: Family Medicine

## 2012-12-17 DIAGNOSIS — Z1231 Encounter for screening mammogram for malignant neoplasm of breast: Secondary | ICD-10-CM

## 2013-01-14 ENCOUNTER — Encounter: Payer: Self-pay | Admitting: Cardiology

## 2013-01-17 ENCOUNTER — Ambulatory Visit: Payer: BC Managed Care – PPO

## 2013-01-23 ENCOUNTER — Encounter: Payer: Self-pay | Admitting: Gynecology

## 2013-01-23 ENCOUNTER — Ambulatory Visit: Payer: BC Managed Care – PPO | Admitting: Gynecology

## 2013-01-23 VITALS — BP 120/76 | Temp 98.7°F | Ht 64.5 in | Wt 187.0 lb

## 2013-01-23 DIAGNOSIS — R102 Pelvic and perineal pain: Secondary | ICD-10-CM

## 2013-01-23 DIAGNOSIS — N949 Unspecified condition associated with female genital organs and menstrual cycle: Secondary | ICD-10-CM

## 2013-01-23 DIAGNOSIS — B9689 Other specified bacterial agents as the cause of diseases classified elsewhere: Secondary | ICD-10-CM

## 2013-01-23 DIAGNOSIS — M62838 Other muscle spasm: Secondary | ICD-10-CM

## 2013-01-23 DIAGNOSIS — N760 Acute vaginitis: Secondary | ICD-10-CM

## 2013-01-23 LAB — CBC WITH DIFFERENTIAL/PLATELET
Basophils Absolute: 0 10*3/uL (ref 0.0–0.1)
Basophils Relative: 1 % (ref 0–1)
Eosinophils Absolute: 0.1 10*3/uL (ref 0.0–0.7)
Eosinophils Relative: 1 % (ref 0–5)
HCT: 37.2 % (ref 36.0–46.0)
Hemoglobin: 12.9 g/dL (ref 12.0–15.0)
Lymphocytes Relative: 19 % (ref 12–46)
Lymphs Abs: 1.6 10*3/uL (ref 0.7–4.0)
MCH: 28.7 pg (ref 26.0–34.0)
MCHC: 34.7 g/dL (ref 30.0–36.0)
MCV: 82.7 fL (ref 78.0–100.0)
Monocytes Absolute: 0.5 10*3/uL (ref 0.1–1.0)
Monocytes Relative: 6 % (ref 3–12)
Neutro Abs: 6.3 10*3/uL (ref 1.7–7.7)
Neutrophils Relative %: 73 % (ref 43–77)
Platelets: 221 10*3/uL (ref 150–400)
RBC: 4.5 MIL/uL (ref 3.87–5.11)
RDW: 13.3 % (ref 11.5–15.5)
WBC: 8.5 10*3/uL (ref 4.0–10.5)

## 2013-01-23 LAB — FOLLICLE STIMULATING HORMONE: FSH: 10.5 m[IU]/mL

## 2013-01-23 MED ORDER — TRAMADOL HCL 50 MG PO TABS: 50.0000 mg | ORAL_TABLET | Freq: Four times a day (QID) | ORAL | Status: DC | PRN

## 2013-01-23 MED ORDER — CYCLOBENZAPRINE HCL 10 MG PO TABS: 10.0000 mg | ORAL_TABLET | Freq: Three times a day (TID) | ORAL | Status: DC | PRN

## 2013-01-23 MED ORDER — CLINDAMYCIN PHOSPHATE 2 % VA CREA: 1.0000 | TOPICAL_CREAM | Freq: Every day | VAGINAL | Status: DC
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
## 2013-01-23 NOTE — Patient Instructions (Addendum)
Use vaginal cream nightly for 7 days Follow for ultrasound as scheduled

## 2013-01-24 ENCOUNTER — Inpatient Hospital Stay (HOSPITAL_COMMUNITY)
Admission: AD | Admit: 2013-01-24 | Discharge: 2013-01-29 | DRG: 493 | Disposition: A | Discharge status: Home / Self Care | Payer: BC Managed Care – PPO | Source: Ambulatory Visit | Attending: Internal Medicine | Admitting: Internal Medicine

## 2013-01-24 ENCOUNTER — Telehealth: Payer: Self-pay | Admitting: Gastroenterology

## 2013-01-24 ENCOUNTER — Encounter: Payer: Self-pay | Admitting: Physician Assistant

## 2013-01-24 ENCOUNTER — Ambulatory Visit (INDEPENDENT_AMBULATORY_CARE_PROVIDER_SITE_OTHER): Payer: BC Managed Care – PPO | Admitting: Physician Assistant

## 2013-01-24 ENCOUNTER — Observation Stay (HOSPITAL_COMMUNITY): Payer: BC Managed Care – PPO

## 2013-01-24 ENCOUNTER — Telehealth: Payer: Self-pay | Admitting: *Deleted

## 2013-01-24 VITALS — BP 192/86 | HR 102 | Temp 99.0°F | Ht 64.5 in | Wt 181.0 lb

## 2013-01-24 DIAGNOSIS — K219 Gastro-esophageal reflux disease without esophagitis: Secondary | ICD-10-CM | POA: Diagnosis present

## 2013-01-24 DIAGNOSIS — R1031 Right lower quadrant pain: Secondary | ICD-10-CM

## 2013-01-24 DIAGNOSIS — K828 Other specified diseases of gallbladder: Secondary | ICD-10-CM

## 2013-01-24 DIAGNOSIS — Z90710 Acquired absence of both cervix and uterus: Secondary | ICD-10-CM

## 2013-01-24 DIAGNOSIS — K589 Irritable bowel syndrome without diarrhea: Secondary | ICD-10-CM | POA: Diagnosis present

## 2013-01-24 DIAGNOSIS — Z860100 Personal history of colon polyps, unspecified: Secondary | ICD-10-CM | POA: Insufficient documentation

## 2013-01-24 DIAGNOSIS — I10 Essential (primary) hypertension: Secondary | ICD-10-CM | POA: Diagnosis present

## 2013-01-24 DIAGNOSIS — Z91199 Patient's noncompliance with other medical treatment and regimen due to unspecified reason: Secondary | ICD-10-CM

## 2013-01-24 DIAGNOSIS — R110 Nausea: Secondary | ICD-10-CM

## 2013-01-24 DIAGNOSIS — R1032 Left lower quadrant pain: Secondary | ICD-10-CM

## 2013-01-24 DIAGNOSIS — M1710 Unilateral primary osteoarthritis, unspecified knee: Secondary | ICD-10-CM | POA: Diagnosis present

## 2013-01-24 DIAGNOSIS — M5030 Other cervical disc degeneration, unspecified cervical region: Secondary | ICD-10-CM | POA: Diagnosis present

## 2013-01-24 DIAGNOSIS — F430 Acute stress reaction: Secondary | ICD-10-CM | POA: Diagnosis present

## 2013-01-24 DIAGNOSIS — R509 Fever, unspecified: Secondary | ICD-10-CM

## 2013-01-24 DIAGNOSIS — N3010 Interstitial cystitis (chronic) without hematuria: Secondary | ICD-10-CM | POA: Diagnosis present

## 2013-01-24 DIAGNOSIS — E876 Hypokalemia: Secondary | ICD-10-CM | POA: Diagnosis present

## 2013-01-24 DIAGNOSIS — R1011 Right upper quadrant pain: Secondary | ICD-10-CM

## 2013-01-24 DIAGNOSIS — R52 Pain, unspecified: Secondary | ICD-10-CM

## 2013-01-24 DIAGNOSIS — Z8719 Personal history of other diseases of the digestive system: Secondary | ICD-10-CM

## 2013-01-24 DIAGNOSIS — Z9119 Patient's noncompliance with other medical treatment and regimen: Secondary | ICD-10-CM

## 2013-01-24 LAB — PROTIME-INR
INR: 1.01 (ref 0.00–1.49)
Prothrombin Time: 13.2 seconds (ref 11.6–15.2)

## 2013-01-24 LAB — COMPREHENSIVE METABOLIC PANEL
ALT: 24 U/L (ref 0–35)
AST: 26 U/L (ref 0–37)
Albumin: 4 g/dL (ref 3.5–5.2)
Alkaline Phosphatase: 59 U/L (ref 39–117)
BUN: 8 mg/dL (ref 6–23)
CO2: 24 mEq/L (ref 19–32)
Calcium: 9.2 mg/dL (ref 8.4–10.5)
Chloride: 102 mEq/L (ref 96–112)
Creatinine, Ser: 0.76 mg/dL (ref 0.50–1.10)
GFR calc Af Amer: >90 mL/min (ref >90)
GFR calc non Af Amer: >90 mL/min (ref >90)
Glucose, Bld: 100 mg/dL — ABNORMAL HIGH (ref 70–99)
Potassium: 3.3 mEq/L — ABNORMAL LOW (ref 3.5–5.1)
Sodium: 137 mEq/L (ref 135–145)
Total Bilirubin: 0.4 mg/dL (ref 0.3–1.2)
Total Protein: 6.8 g/dL (ref 6.0–8.3)

## 2013-01-24 LAB — CBC
HCT: 36.7 % (ref 36.0–46.0)
Hemoglobin: 12.8 g/dL (ref 12.0–15.0)
MCH: 29.8 pg (ref 26.0–34.0)
MCHC: 34.9 g/dL (ref 30.0–36.0)
MCV: 85.5 fL (ref 78.0–100.0)
Platelets: 197 10*3/uL (ref 150–400)
RBC: 4.29 MIL/uL (ref 3.87–5.11)
RDW: 12.4 % (ref 11.5–15.5)
WBC: 9 10*3/uL (ref 4.0–10.5)

## 2013-01-24 MED ORDER — ONDANSETRON HCL 4 MG/2ML IJ SOLN
4.0000 mg | Freq: Four times a day (QID) | INTRAMUSCULAR | Status: DC | PRN
  Administered 2013-01-24 – 2013-01-29 (×8): 4 mg via INTRAVENOUS
  Filled 2013-01-24 (×8): qty 2

## 2013-01-24 MED ORDER — HYDROMORPHONE HCL PF 1 MG/ML IJ SOLN: 1.0000 mg | INTRAMUSCULAR | Status: DC | PRN

## 2013-01-24 MED ORDER — HYDROCODONE-ACETAMINOPHEN 5-325 MG PO TABS
1.0000 | ORAL_TABLET | ORAL | Status: AC | PRN
  Administered 2013-01-24: 2 via ORAL
  Filled 2013-01-24: qty 2

## 2013-01-24 MED ORDER — IOHEXOL 300 MG/ML  SOLN
100.0000 mL | Freq: Once | INTRAMUSCULAR | Status: AC | PRN
  Administered 2013-01-24: 100 mL via INTRAVENOUS

## 2013-01-24 MED ORDER — KCL IN DEXTROSE-NACL 10-5-0.45 MEQ/L-%-% IV SOLN
INTRAVENOUS | Status: DC
  Administered 2013-01-24 – 2013-01-28 (×8): via INTRAVENOUS
  Filled 2013-01-24 (×13): qty 1000

## 2013-01-24 MED ORDER — ACETAMINOPHEN 325 MG PO TABS: 650.0000 mg | ORAL_TABLET | Freq: Four times a day (QID) | ORAL | Status: DC | PRN

## 2013-01-24 MED ORDER — PIPERACILLIN-TAZOBACTAM 3.375 G IVPB
3.3750 g | Freq: Three times a day (TID) | INTRAVENOUS | Status: DC
  Administered 2013-01-24 – 2013-01-25 (×3): 3.375 g via INTRAVENOUS
  Filled 2013-01-24 (×5): qty 50

## 2013-01-24 MED ORDER — ACETAMINOPHEN 650 MG RE SUPP: 650.0000 mg | Freq: Four times a day (QID) | RECTAL | Status: DC | PRN

## 2013-01-24 MED ORDER — HYDROMORPHONE HCL PF 2 MG/ML IJ SOLN
2.0000 mg | INTRAMUSCULAR | Status: DC | PRN
  Administered 2013-01-24 – 2013-01-26 (×9): 2 mg via INTRAVENOUS
  Filled 2013-01-24 (×9): qty 1

## 2013-01-24 MED ORDER — PANTOPRAZOLE SODIUM 40 MG PO TBEC: 40.0000 mg | DELAYED_RELEASE_TABLET | Freq: Every day | ORAL | Status: DC

## 2013-01-24 MED ORDER — IOHEXOL 300 MG/ML  SOLN
50.0000 mL | Freq: Once | INTRAMUSCULAR | Status: AC | PRN
  Administered 2013-01-24: 50 mL via ORAL

## 2013-01-24 MED ORDER — LOSARTAN POTASSIUM 50 MG PO TABS
50.0000 mg | ORAL_TABLET | Freq: Two times a day (BID) | ORAL | Status: DC
  Administered 2013-01-24 – 2013-01-29 (×8): 50 mg via ORAL
  Filled 2013-01-24 (×11): qty 1

## 2013-01-24 MED ORDER — PIPERACILLIN-TAZOBACTAM 3.375 G IVPB: 3.3750 g | Freq: Four times a day (QID) | INTRAVENOUS | Status: DC

## 2013-01-24 MED ORDER — HYDROMORPHONE HCL PF 1 MG/ML IJ SOLN
1.0000 mg | INTRAMUSCULAR | Status: DC | PRN
  Administered 2013-01-24: 1 mg via INTRAVENOUS
  Filled 2013-01-24: qty 1

## 2013-01-24 NOTE — H&P (Signed)
Primary Care Physician:  Dara Lords, MD Primary Gastroenterologist:  Dr. Judie Petit stark  CHIEF COMPLAINT:  Acute severe lower abdominal pain x5 days with fever  HPI: Deanna George is a 47 y.o. female known to Dr. Russella Dar with history of GERD and colon polyps. She had colonoscopy in April of 2010 with finding of 2 diminutive hyperplastic polyps, no diverticuli were noted She does have history of interstitial cystitis and is status post vaginal hysterectomy with ovaries in situ. She came into the office today as an urgent add-on with complaints of progressively severe lower abdominal pain which has been present over the past 5 days. She says in retrospect she had been having some vague lower Donald discomfort for about a week prior to that but this past Sunday her pain was intense became constant and is to the point where she is unable to rest and unable to sleep over the past 2 nights. She says she has had fever off and on over the past 5 days to 102 at home and has had associated chills and intermittent dizziness. Because of her history of interstitial cystitis she was seen by urology on 312 and had a urine culture done which is returned negative. She had had instillation into her bladder but didn't notice any change with her symptoms and she does not feel that her pain is bladder related area she has also been ordered gynecologist Dr. Audie Box this week who was trying to get a pelvic ultrasound however there were seen was down and she is scheduled for a pelvic ultrasound next week. She came here because she is feeling worse area she says she's been unable to eat much has no appetite and that her pain is definitely worse after eating. She has not had a bowel movement over the past 2 days. She describes the pain as a constant heavy intense aching in her lower abdomen which radiates into her lower back and on standing feels like it pulls in her pelvis. CBC done on 3/12 was unremarkable and you a showed  positive nitrites but culture was negative.   Past Medical History  Diagnosis Date  . Hypertension   . Interstitial cystitis   . Pneumonia   . Urinary tract infection   . GERD (gastroesophageal reflux disease)   . Esophageal stricture   . IBS (irritable bowel syndrome)   . Hemorrhoids   . Anal fissure   . Palpitations   . OA (osteoarthritis) of knee   . Cervical disc disease   . Noncompliance   . Situational stress     Past Surgical History  Procedure Laterality Date  . Laparoscopic vaginal hysterectomy      02/2008  . Laparoscopic surgery  01/2007    uterus and ovary x 2  . Cesarean section  [id number]    x2  . Foot pin post fracture  09/2008    Left foot  . Colonoscopy    . Knee arthroscopy  2011     right  . US echocardiography  07/31/2008    EF 55-60% with mild LVH    Prior to Admission medications   Medication Sig Start Date End Date Taking? Authorizing Provider  clindamycin (CLEOCIN) 2 % vaginal cream Place 1 Applicatorful vaginally at bedtime. 01/23/13  Yes Dara Lords, MD  cyclobenzaprine (FLEXERIL) 10 MG tablet Take 1 tablet (10 mg total) by mouth 3 (three) times daily as needed. 01/23/13  Yes Dara Lords, MD  HYDROcodone-acetaminophen (LORTAB) 7.5-500 MG per tablet Take  1 tablet by mouth every 6 (six) hours as needed.     Yes Historical Provider, MD  losartan (COZAAR) 50 MG tablet Take 1 tablet (50 mg total) by mouth 2 (two) times daily. 11/21/12  Yes Dolores Patty, MD  omeprazole (PRILOSEC) 40 MG capsule Take 1 capsule (40 mg total) by mouth daily. 12/13/12  Yes Meryl Dare, MD  Promethazine HCl (PHENERGAN PO) Take by mouth as needed.     Historical Provider, MD  traMADol (ULTRAM) 50 MG tablet Take 1 tablet (50 mg total) by mouth every 6 (six) hours as needed for pain. 01/23/13   Dara Lords, MD    Current Outpatient Prescriptions  Medication Sig Dispense Refill  . clindamycin (CLEOCIN) 2 % vaginal cream Place 1 Applicatorful vaginally  at bedtime.  40 g  0  . cyclobenzaprine (FLEXERIL) 10 MG tablet Take 1 tablet (10 mg total) by mouth 3 (three) times daily as needed.  30 tablet  0  . HYDROcodone-acetaminophen (LORTAB) 7.5-500 MG per tablet Take 1 tablet by mouth every 6 (six) hours as needed.        Marland Kitchen losartan (COZAAR) 50 MG tablet Take 1 tablet (50 mg total) by mouth 2 (two) times daily.  60 tablet  1  . omeprazole (PRILOSEC) 40 MG capsule Take 1 capsule (40 mg total) by mouth daily.  90 capsule  3  . Promethazine HCl (PHENERGAN PO) Take by mouth as needed.       . traMADol (ULTRAM) 50 MG tablet Take 1 tablet (50 mg total) by mouth every 6 (six) hours as needed for pain.  30 tablet  0   No current facility-administered medications for this visit.    Allergies as of 01/24/2013  . (No Known Allergies)    Family History  Problem Relation Age of Onset  . Adopted: Yes    History   Social History  . Marital Status: Legally Separated    Spouse Name: N/A    Number of Children: N/A  . Years of Education: N/A   Occupational History  . Not on file.   Social History Main Topics  . Smoking status: Never Smoker   . Smokeless tobacco: Never Used  . Alcohol Use: No  . Drug Use: No  . Sexually Active: No   Other Topics Concern  . Not on file   Social History Narrative  . No narrative on file    Review of Systems: Pertinent positive and negative review of systems were noted in the above HPI section.  All other review of systems was otherwise negative.  Physical Exam: Vital signs in last 24 hours: @VSRANGES @   General:   Alert,  well-developed, cooperative white female in NAD. She is flushed an uncomfortable appearing having chills during the exam. Blood pressure 192/86 pulse 102 temp 99 8 Head:  Normocephalic and atraumatic. Eyes:  Sclera clear, no icterus.   Conjunctiva pink. Ears:  Normal auditory acuity. Mouth:  No deformity or lesions.  Oropharynx pink & moist. Neck:  Supple; no masses or  thyromegaly. Lungs:  Clear throughout to auscultation.   No wheezes, crackles, or rhonchi. No acute distress. Heart: Tachycardia  Regular rate and rhythm; no murmurs. Abdomen:  Soft, mild diffuse tenderness and more marked tenderness in bilateral lower quadrants though no guarding or rebound and no palpable mass or hepatosplenomegaly bowel sounds are quiet . Marland Kitchen    Rectal: Heme-negative brown stool impaction no fluctuance in no significant tenderness  Msk:  Symmetrical without  gross deformities. Normal posture. Pulses:  Normal pulses noted. Extremities:  Without edema. Neurologic:  Alert and  oriented x4;  grossly normal neurologically. Skin:  Intact without significant lesions or rashes. Cervical Nodes:  No significant cervical adenopathy. Psych:  Alert and cooperative. Normal mood and affect.  Lab Results:  Recent Labs  01/23/13 1524  WBC 8.5  HGB 12.9  HCT 37.2  PLT 221     Studies/Results: No results found.  Impression / Plan:  #46  47 year old white female with 5 day history of progressively severe lower abdominal pain and associated fever to 102 with anorexia .etiology of her pain and fever are unclear , UTI has been ruled out. She has an acute intra-abdominal inflammatory process, will need to rule out diverticulitis, perforated viscus , retrocecal appendicitis .or ovarian inflammatory process .  #2 interstitial cystitis #3 GERD #4 hyperplastic colon polyps  Plan; patient is admitted to the GI service for supportive management with IV fluid hydration and anti-emetics analgesics and will be covered with IV Zosyn empirically Will obtain  stat CT scan of the abdomen and pelvis Baseline labs Further details please see the orders.        @RRHLOS @  Antero Derosia  01/24/2013, 5:14 PM

## 2013-01-24 NOTE — Telephone Encounter (Signed)
Pt was seen yesterday c/o pelvic pressure and pain for the past 2 weeks. She called back this am stating her pain is unbearable. I asked pt if she took her pain medication and she has not, she is in school and concerned that it may make her drowsy. She has been taking OTC tylenol which has not helped, she asked if possible to go to hospital to have u/s done, she doesn't think she can wait until next week for ultrasound. Please advise

## 2013-01-24 NOTE — Telephone Encounter (Signed)
She could have scheduled her ultrasound this morning in the office as I thought she was going to do. She needs to take the pain medicine because that is why it was prescribed regardless of ultrasound findings she needs to manage her pain.   Her lab work from yesterday was normal with a normal white count so it does not look like an overt infection is causing her pain. If her pain continues significantly this weekend after taking her pain medication and being off her feet and resting, then she will need to go the emergency room to be evaluated  because there are other reasons for pain besides GYN.  Even if the ultrasound showed an ovarian cyst I would at this point try to manage her expectantly with pain medication so either way start with the pain medication and rest.

## 2013-01-24 NOTE — Progress Notes (Signed)
Patient ID: MANYA BALASH, female   DOB: 09/20/1966, 47 y.o.   MRN: [id number] Ora is a pleasant 47 year old white female known to Dr. Russella Dar from prior colonoscopies who comes in this afternoon as an urgent add-on with acute severe lower abnormal pain x5 days she's been progressive. She also relates intermittent fevers over the past 5 days to 102 at home associated with chills and intermittent dizziness. This week she has been seen by urology him a as she has history of interstitial cystitis and UTI was ruled out. She was also seen by her gynecologist to set her up for a pelvic ultrasound next week. Her pain has been progressively severe and she says she's not sure she can stand much more she has been unable to sleep the past couple of nights due to pain has no appetite has been unable to have a bowel movement over the past 2 days and says her pain is much more severe if she tries to eat or drink anything. She complains of a heaviness in her lower abdomen which is worse with standing. She appears acutely ill in the office today, he is having chills temp is 99 8, her blood pressure is elevated at 192/86 and her pulse is 102. She is flushed, and un  comfortable appearing. She will be admitted to observation at [facility] or supportive management and further diagnostic evaluation with CT of the abdomen and pelvis this evening and will be covered empirically with IV antibiotics until a more definite diagnosis is known.. For details please see the dictated H&P.

## 2013-01-24 NOTE — Telephone Encounter (Signed)
Patient reports abdominal pain and nausea.  She has seen her primary and her GYN this week and they ruled out UTI and are setting her up for a [place] to rule out an ovarian cyst.  She states pain worse after meals.  She will come in and see Amy Esterwood PA today at 3:00

## 2013-01-24 NOTE — Telephone Encounter (Signed)
Pt informed with the below note. 

## 2013-01-25 ENCOUNTER — Encounter (HOSPITAL_COMMUNITY): Payer: Self-pay

## 2013-01-25 ENCOUNTER — Observation Stay (HOSPITAL_COMMUNITY): Payer: BC Managed Care – PPO

## 2013-01-25 DIAGNOSIS — R109 Unspecified abdominal pain: Secondary | ICD-10-CM

## 2013-01-25 DIAGNOSIS — K811 Chronic cholecystitis: Secondary | ICD-10-CM

## 2013-01-25 LAB — BASIC METABOLIC PANEL
BUN: 7 mg/dL (ref 6–23)
CO2: 24 mEq/L (ref 19–32)
Calcium: 8.7 mg/dL (ref 8.4–10.5)
Chloride: 99 mEq/L (ref 96–112)
Creatinine, Ser: 0.76 mg/dL (ref 0.50–1.10)
GFR calc Af Amer: >90 mL/min (ref >90)
GFR calc non Af Amer: >90 mL/min (ref >90)
Glucose, Bld: 142 mg/dL — ABNORMAL HIGH (ref 70–99)
Potassium: 3.4 mEq/L — ABNORMAL LOW (ref 3.5–5.1)
Sodium: 133 mEq/L — ABNORMAL LOW (ref 135–145)

## 2013-01-25 LAB — CBC
HCT: 34.7 % — ABNORMAL LOW (ref 36.0–46.0)
Hemoglobin: 11.9 g/dL — ABNORMAL LOW (ref 12.0–15.0)
MCH: 29.5 pg (ref 26.0–34.0)
MCHC: 34.3 g/dL (ref 30.0–36.0)
MCV: 86.1 fL (ref 78.0–100.0)
Platelets: 179 10*3/uL (ref 150–400)
RBC: 4.03 MIL/uL (ref 3.87–5.11)
RDW: 12.5 % (ref 11.5–15.5)
WBC: 9.4 10*3/uL (ref 4.0–10.5)

## 2013-01-25 LAB — LIPASE, BLOOD: Lipase: 19 U/L (ref 11–59)

## 2013-01-25 MED ORDER — PANTOPRAZOLE SODIUM 40 MG IV SOLR
40.0000 mg | Freq: Every day | INTRAVENOUS | Status: DC
  Administered 2013-01-25 – 2013-01-29 (×4): 40 mg via INTRAVENOUS
  Filled 2013-01-25 (×6): qty 40

## 2013-01-25 MED ORDER — LORAZEPAM 2 MG/ML IJ SOLN
1.0000 mg | INTRAMUSCULAR | Status: DC | PRN
  Administered 2013-01-25 – 2013-01-28 (×4): 1 mg via INTRAVENOUS
  Filled 2013-01-25 (×4): qty 1

## 2013-01-25 MED ORDER — SUMATRIPTAN SUCCINATE 25 MG PO TABS
25.0000 mg | ORAL_TABLET | Freq: Three times a day (TID) | ORAL | Status: DC | PRN
  Administered 2013-01-25: 25 mg via ORAL
  Filled 2013-01-25 (×2): qty 1

## 2013-01-25 MED ORDER — LORAZEPAM 2 MG/ML IJ SOLN
1.0000 mg | Freq: Once | INTRAMUSCULAR | Status: AC
  Administered 2013-01-25: 1 mg via INTRAVENOUS
  Filled 2013-01-25: qty 1

## 2013-01-25 MED ORDER — PROMETHAZINE HCL 25 MG/ML IJ SOLN
12.5000 mg | Freq: Once | INTRAMUSCULAR | Status: AC
  Administered 2013-01-25: 12.5 mg via INTRAVENOUS
  Filled 2013-01-25: qty 1

## 2013-01-25 MED ORDER — PROMETHAZINE HCL 25 MG/ML IJ SOLN
12.5000 mg | INTRAMUSCULAR | Status: DC | PRN
  Administered 2013-01-25 – 2013-01-27 (×4): 12.5 mg via INTRAVENOUS
  Filled 2013-01-25 (×4): qty 1

## 2013-01-25 MED ORDER — KETOROLAC TROMETHAMINE 30 MG/ML IJ SOLN
30.0000 mg | Freq: Three times a day (TID) | INTRAMUSCULAR | Status: AC
  Administered 2013-01-25 – 2013-01-28 (×9): 30 mg via INTRAVENOUS
  Filled 2013-01-25 (×9): qty 1

## 2013-01-25 MED ORDER — ENOXAPARIN SODIUM 40 MG/0.4ML ~~LOC~~ SOLN
40.0000 mg | SUBCUTANEOUS | Status: DC
  Administered 2013-01-26 – 2013-01-29 (×2): 40 mg via SUBCUTANEOUS
  Filled 2013-01-25 (×5): qty 0.4

## 2013-01-25 NOTE — Consult Note (Signed)
Reason for Consult:abdominal pain Referring Physician: Dr Suzzette Righter is an 47 y.o. female.  HPI: 47 yo WF referred by Dr Loreta Ave for evaluation of abdominal pain. Patient reports that she started feeling bad last Sunday night and early morning Monday. She describes pain in her lower abdomen and lower back associated with nausea. She felt that she might be having a bladder infection. She states that she went to her PCPs office on Monday and was found to have a low-grade fever but her bladder workup was negative per patient. She states that the pain worsened Monday evening. On Tuesday she has skipped class due to the discomfort. She saw her urologist Dr. Isabel Caprice because she thought it may be related to her interstitial cystitis. However she states that this discomfort is different than her typical bladder discomfort. She states that the nurse practitioner instilled some numbing medicine in her bladder which really did not cause any relief. She states that she got worse on Wednesday with symptoms of dizziness and just overall feeling sick and ill. She went to see her GYN Dr. Audie Box because she thought it may be related to her ovaries. Her pelvic exam was within normal limits. And she was scheduled for an outpatient pelvic ultrasound for next week. A CBC was checked which was normal. She states that she was given a prescription for Ultram which did not give her any relief. She continued to have abdominal pain mainly in her lower abdomen associated with waves of nausea. The pain continued to get worse on Thursday night and Friday. She ultimately presented to her gastroenterologist office on Friday complaining of lower abdominal pain. She also complained of nausea. She also complained of increased nausea after attempts of PO. She reports subjective fevers throughout the entire week. She describes her pain as a sensation of being bloated from the inside out. She describes the pain getting progressively worse  throughout the week. She states it is getting bigger, Bolder, and stronger. She describes it as being kicked in the gut. She describes as a cramping sensation and a heavy hurt. She denies any prior symptoms. Her last bowel movement was on Wednesday which is abnormal for her. She normally has a bowel movement daily. She denies any weight loss. She denies any antibiotic usage. She denies any foreign travel. She denies any diarrhea, melena or hematochezia. She denies any vomiting. She states that the pain is always there but it increases in intensity. The pain is mainly in her central abdomen now.  Her gastroenterologist office admitted the patient to the hospital on Friday evening for pain control and workup. She was started on empiric antibiotic. She had a CT scan on admission which was within normal limits except for a small ovarian follicle. She underwent an abdominal ultrasound earlier today which only showed some gallbladder sludge  Past Medical History  Diagnosis Date  . Hypertension   . Interstitial cystitis   . Pneumonia   . Urinary tract infection   . GERD (gastroesophageal reflux disease)   . Esophageal stricture   . IBS (irritable bowel syndrome)   . Hemorrhoids   . Anal fissure   . Palpitations   . OA (osteoarthritis) of knee   . Cervical disc disease   . Noncompliance   . Situational stress     Past Surgical History  Procedure Laterality Date  . Laparoscopic vaginal hysterectomy      02/2008  . Laparoscopic surgery  01/2007    uterus and ovary  x 2  . Cesarean section  [id number]    x2  . Foot pin post fracture  09/2008    Left foot  . Colonoscopy    . Knee arthroscopy  2011     right  . US echocardiography  07/31/2008    EF 55-60% with mild LVH    Family History  Problem Relation Age of Onset  . Adopted: Yes    Social History:  reports that she has never smoked. She has never used smokeless tobacco. She reports that she does not drink alcohol or use illicit  drugs.  Allergies: No Known Allergies  Medications: I have reviewed the patient's current medications.  Results for orders placed during the hospital encounter of 01/24/13 (from the past 48 hour(s))  CBC     Status: None   Collection Time    01/24/13  7:56 PM      Result Value Range   WBC 9.0  4.0 - 10.5 K/uL   RBC 4.29  3.87 - 5.11 MIL/uL   Hemoglobin 12.8  12.0 - 15.0 g/dL   HCT 96.2  95.2 - 84.1 %   MCV 85.5  78.0 - 100.0 fL   MCH 29.8  26.0 - 34.0 pg   MCHC 34.9  30.0 - 36.0 g/dL   RDW 32.4  40.1 - 02.7 %   Platelets 197  150 - 400 K/uL  COMPREHENSIVE METABOLIC PANEL     Status: Abnormal   Collection Time    01/24/13  7:56 PM      Result Value Range   Sodium 137  135 - 145 mEq/L   Potassium 3.3 (*) 3.5 - 5.1 mEq/L   Chloride 102  96 - 112 mEq/L   CO2 24  19 - 32 mEq/L   Glucose, Bld 100 (*) 70 - 99 mg/dL   BUN 8  6 - 23 mg/dL   Creatinine, Ser 2.53  0.50 - 1.10 mg/dL   Calcium 9.2  8.4 - 66.4 mg/dL   Total Protein 6.8  6.0 - 8.3 g/dL   Albumin 4.0  3.5 - 5.2 g/dL   AST 26  0 - 37 U/L   ALT 24  0 - 35 U/L   Alkaline Phosphatase 59  39 - 117 U/L   Total Bilirubin 0.4  0.3 - 1.2 mg/dL   GFR calc non Af Amer >90  >90 mL/min   GFR calc Af Amer >90  >90 mL/min   Comment:            The eGFR has been calculated     using the CKD EPI equation.     This calculation has not been     validated in all clinical     situations.     eGFR's persistently     <90 mL/min signify     possible Chronic Kidney Disease.  PROTIME-INR     Status: None   Collection Time    01/24/13  7:56 PM      Result Value Range   Prothrombin Time 13.2  11.6 - 15.2 seconds   INR 1.01  0.00 - 1.49  CBC     Status: Abnormal   Collection Time    01/25/13  4:10 AM      Result Value Range   WBC 9.4  4.0 - 10.5 K/uL   RBC 4.03  3.87 - 5.11 MIL/uL   Hemoglobin 11.9 (*) 12.0 - 15.0 g/dL   HCT 40.3 (*) 47.4 - 25.9 %  MCV 86.1  78.0 - 100.0 fL   MCH 29.5  26.0 - 34.0 pg   MCHC 34.3  30.0 - 36.0  g/dL   RDW 81.1  91.4 - 78.2 %   Platelets 179  150 - 400 K/uL  BASIC METABOLIC PANEL     Status: Abnormal   Collection Time    01/25/13  4:10 AM      Result Value Range   Sodium 133 (*) 135 - 145 mEq/L   Potassium 3.4 (*) 3.5 - 5.1 mEq/L   Chloride 99  96 - 112 mEq/L   CO2 24  19 - 32 mEq/L   Glucose, Bld 142 (*) 70 - 99 mg/dL   BUN 7  6 - 23 mg/dL   Creatinine, Ser 9.56  0.50 - 1.10 mg/dL   Calcium 8.7  8.4 - 21.3 mg/dL   GFR calc non Af Amer >90  >90 mL/min   GFR calc Af Amer >90  >90 mL/min   Comment:            The eGFR has been calculated     using the CKD EPI equation.     This calculation has not been     validated in all clinical     situations.     eGFR's persistently     <90 mL/min signify     possible Chronic Kidney Disease.  LIPASE, BLOOD     Status: None   Collection Time    01/25/13  4:10 AM      Result Value Range   Lipase 19  11 - 59 U/L    US Abdomen Complete  01/25/2013  *RADIOLOGY REPORT*  Clinical Data:  Right upper quadrant abdominal pain.  ABDOMINAL ULTRASOUND COMPLETE  Comparison:  CT on 01/24/2013  Findings:  Gallbladder:  A small amount of layering echogenic sludge is seen in the gallbladder, but no discrete gallstones are identified. There is no evidence of gallbladder wall thickening or pericholecystic fluid.  Common Bile Duct:  Within normal limits in caliber. Measures 6 mm in diameter.  Liver: No focal mass lesion identified.  Within normal limits in parenchymal echogenicity.  IVC:  Appears normal.  Pancreas:  No abnormality identified.  Spleen:  Within normal limits in size and echotexture.  Right kidney:  Normal in size and parenchymal echogenicity.  No evidence of mass or hydronephrosis.  Left kidney:  Normal in size and parenchymal echogenicity.  No evidence of mass or hydronephrosis.  Abdominal Aorta:  No aneurysm identified.  IMPRESSION:  1.  Small amount of gallbladder sludge noted, but no discrete gallstones or signs of acute cholecystitis. 2.   No evidence of biliary dilatation, hydronephrosis, or other significant abnormality.   Original Report Authenticated By: Myles Rosenthal, M.D.    Ct Abdomen Pelvis W Contrast  01/24/2013  *RADIOLOGY REPORT*  Clinical Data: Acute severe lower abdominal pain for 5 days.  Prior hysterectomy.  CT ABDOMEN AND PELVIS WITH CONTRAST  Technique:  Multidetector CT imaging of the abdomen and pelvis was performed following the standard protocol during bolus administration of intravenous contrast.  Contrast: OMNIPAQUE IOHEXOL 300 MG/ML  SOLN, 50mL OMNIPAQUE IOHEXOL 300 MG/ML  SOLN  Comparison: None  Findings: Lung bases are unremarkable.  No focal abnormality identified within the liver, spleen, pancreas, adrenal glands, or kidneys.  Possible intrarenal calculus identified in the mid pole region of the right kidney measures 3 mm in diameter.  The stomach, small bowel loops have a normal appearance. The appendix  is well seen and has a normal appearance.  Colonic loops are normal in appearance.  The uterus is absent.  Probable left ovarian follicle is 2.0 cm. No free pelvic fluid or adenopathy. No evidence for aortic aneurysm.  Visualized osseous structures have a normal appearance.  IMPRESSION:  1.  No evidence for acute abnormality of the abdomen or pelvis. 2.  Probable intrarenal calculus in the right kidney, 3 mm in diameter.  No urinary tract obstruction. 3.  Normal appendix. 4.  Probable left ovarian follicle, 2 cm in diameter.   Original Report Authenticated By: Norva Pavlov, M.D.     Review of Systems  Constitutional: Positive for fever (subjective (99-101)). Negative for chills and weight loss.  HENT: Negative for hearing loss, nosebleeds, congestion and neck pain.   Eyes: Negative for blurred vision and double vision.  Respiratory: Negative for cough, shortness of breath and wheezing.   Cardiovascular: Negative for chest pain and palpitations.       Blood pressure has been up since started having pain   Gastrointestinal: Positive for nausea, abdominal pain and constipation. Negative for vomiting, diarrhea, blood in stool and melena.  Genitourinary: Negative for dysuria and hematuria.  Musculoskeletal: Positive for back pain.  Skin: Negative for rash.  Neurological: Negative for focal weakness, seizures, loss of consciousness and weakness.  Psychiatric/Behavioral: Negative for suicidal ideas and substance abuse.   Blood pressure 108/62, pulse 79, temperature 98.7 F (37.1 C), temperature source Oral, resp. rate 16, height 5\' 4"  (1.626 m), weight 182 lb 1.6 oz (82.6 kg), SpO2 98.00%. Physical Exam  Vitals reviewed. Constitutional: Vital signs are normal. She appears well-developed and well-nourished. She appears cachectic.  Looks uncomfortable. Sitting on beside couch, tearful intermittently  HENT:  Head: Normocephalic and atraumatic.  Right Ear: External ear normal.  Left Ear: External ear normal.  Eyes: Conjunctivae are normal. No scleral icterus.  Neck: Neck supple. No tracheal deviation present. No thyromegaly present.  Cardiovascular: Normal rate, regular rhythm and normal heart sounds.   Respiratory: Effort normal. No respiratory distress. She has no wheezes.  GI: Soft. Bowel sounds are normal. She exhibits no distension. There is tenderness in the right upper quadrant, epigastric area and periumbilical area. There is no rigidity, no rebound and no guarding.    No RT/involuntary guarding. No peritonitis  Musculoskeletal: She exhibits no edema and no tenderness.  Lymphadenopathy:    She has no cervical adenopathy.  Neurological: She is alert. She exhibits normal muscle tone.  Skin: Skin is warm and dry. No rash noted. No erythema. No pallor.  Psychiatric: Judgment and thought content normal. Her mood appears anxious. Her affect is labile. Cognition and memory are normal.    Assessment/Plan: Nausea Abdominal Pain HTN IC Hypokalemia  The etiology of her abdominal pain and  nausea is unclear. There is no sign of intestinal perforation, intestinal ischemia on her CT scan. The contrast goes through her colon. Her appendix appears normal on CT. Her pancreas looks normal as well. All of her lab work is essentially normal with the exception of a low potassium. I am not sure of all of her symptoms are due to biliary colic-biliary dyskinesia. At this point I would not recommend cholecystectomy without some more additional objective data. As I explained to the patient, I cannot give her any odds of amelioration of her abdominal pain with a cholecystectomy. I also explained that I have taken her gallbladder is in the past in the setting of negative scans and workup. However in  this case I do believe we need to wait for a HIDA scan with an ejection fraction.  In the interim, I recommend stopping her antibiotic since there is no indication of infection. I will place her on some Toradol. She is concerned with taking narcotics. Also recommend a urology consult. We'll try to obtain a pelvic ultrasound this weekend to further evaluate her ovaries. Repeat labs in the morning.  Total time 80 minutes  Mary Sella. Andrey Campanile, MD, FACS General, Bariatric, & Minimally Invasive Surgery [facility], [place]   [facility] M 01/25/2013, 8:20 PM

## 2013-01-26 ENCOUNTER — Observation Stay (HOSPITAL_COMMUNITY): Payer: BC Managed Care – PPO

## 2013-01-26 LAB — COMPREHENSIVE METABOLIC PANEL
ALT: 24 U/L (ref 0–35)
AST: 24 U/L (ref 0–37)
Albumin: 3.1 g/dL — ABNORMAL LOW (ref 3.5–5.2)
Alkaline Phosphatase: 48 U/L (ref 39–117)
BUN: 4 mg/dL — ABNORMAL LOW (ref 6–23)
CO2: 26 mEq/L (ref 19–32)
Calcium: 8.5 mg/dL (ref 8.4–10.5)
Chloride: 106 mEq/L (ref 96–112)
Creatinine, Ser: 0.75 mg/dL (ref 0.50–1.10)
GFR calc Af Amer: >90 mL/min (ref >90)
GFR calc non Af Amer: >90 mL/min (ref >90)
Glucose, Bld: 109 mg/dL — ABNORMAL HIGH (ref 70–99)
Potassium: 4.2 mEq/L (ref 3.5–5.1)
Sodium: 138 mEq/L (ref 135–145)
Total Bilirubin: 0.4 mg/dL (ref 0.3–1.2)
Total Protein: 5.7 g/dL — ABNORMAL LOW (ref 6.0–8.3)

## 2013-01-26 LAB — CBC WITH DIFFERENTIAL/PLATELET
Basophils Absolute: 0 10*3/uL (ref 0.0–0.1)
Basophils Relative: 0 % (ref 0–1)
Eosinophils Absolute: 0.1 10*3/uL (ref 0.0–0.7)
Eosinophils Relative: 2 % (ref 0–5)
HCT: 35.6 % — ABNORMAL LOW (ref 36.0–46.0)
Hemoglobin: 12 g/dL (ref 12.0–15.0)
Lymphocytes Relative: 38 % (ref 12–46)
Lymphs Abs: 2 10*3/uL (ref 0.7–4.0)
MCH: 29.5 pg (ref 26.0–34.0)
MCHC: 33.7 g/dL (ref 30.0–36.0)
MCV: 87.5 fL (ref 78.0–100.0)
Monocytes Absolute: 0.4 10*3/uL (ref 0.1–1.0)
Monocytes Relative: 8 % (ref 3–12)
Neutro Abs: 2.7 10*3/uL (ref 1.7–7.7)
Neutrophils Relative %: 52 % (ref 43–77)
Platelets: 170 10*3/uL (ref 150–400)
RBC: 4.07 MIL/uL (ref 3.87–5.11)
RDW: 12.5 % (ref 11.5–15.5)
WBC: 5.1 10*3/uL (ref 4.0–10.5)

## 2013-01-26 MED ORDER — DIAZEPAM 5 MG PO TABS
5.0000 mg | ORAL_TABLET | Freq: Four times a day (QID) | ORAL | Status: AC
  Administered 2013-01-26 – 2013-01-27 (×2): 5 mg via ORAL
  Filled 2013-01-26: qty 1

## 2013-01-26 MED ORDER — DIAZEPAM 5 MG PO TABS
5.0000 mg | ORAL_TABLET | Freq: Four times a day (QID) | ORAL | Status: DC | PRN
  Administered 2013-01-27: 5 mg via ORAL
  Filled 2013-01-26 (×2): qty 1

## 2013-01-26 MED ORDER — ACETAMINOPHEN 10 MG/ML IV SOLN
1000.0000 mg | Freq: Four times a day (QID) | INTRAVENOUS | Status: AC
  Administered 2013-01-27 (×3): 1000 mg via INTRAVENOUS
  Filled 2013-01-26 (×4): qty 100

## 2013-01-26 NOTE — Progress Notes (Signed)
Subjective: Slept better last night. Still with waves of pain. Just got back from pelvic ultrasound - reports it was uncomfortable when tech pressed on Rt ovary  Objective: Vital signs in last 24 hours: Temp:  [98 F (36.7 C)-98.7 F (37.1 C)] 98 F (36.7 C) (03/16 0705) Pulse Rate:  [60-79] 60 (03/16 0705) Resp:  [16] 16 (03/16 0705) BP: (108-133)/(62-79) 129/63 mmHg (03/16 0705) SpO2:  [97 %-100 %] 100 % (03/16 0705)    Intake/Output from previous day: 03/15 0701 - 03/16 0700 In: 5158.3 [P.O.:860; I.V.:4196.3; IV Piggyback:102] Out: -  Intake/Output this shift:    Alert, laying on bedside couch, non-toxic Soft, obese. Some TTP rt side/RLQ  Lab Results:   Recent Labs  01/25/13 0410 01/26/13 0408  WBC 9.4 5.1  HGB 11.9* 12.0  HCT 34.7* 35.6*  PLT 179 170   BMET  Recent Labs  01/25/13 0410 01/26/13 0408  NA 133* 138  K 3.4* 4.2  CL 99 106  CO2 24 26  GLUCOSE 142* 109*  BUN 7 4*  CREATININE 0.76 0.75  CALCIUM 8.7 8.5   PT/INR  Recent Labs  01/24/13 1956  LABPROT 13.2  INR 1.01   ABG No results found for this basename: PHART, PCO2, PO2, HCO3,  in the last 72 hours  Studies/Results: US Abdomen Complete  01/25/2013  *RADIOLOGY REPORT*  Clinical Data:  Right upper quadrant abdominal pain.  ABDOMINAL ULTRASOUND COMPLETE  Comparison:  CT on 01/24/2013  Findings:  Gallbladder:  A small amount of layering echogenic sludge is seen in the gallbladder, but no discrete gallstones are identified. There is no evidence of gallbladder wall thickening or pericholecystic fluid.  Common Bile Duct:  Within normal limits in caliber. Measures 6 mm in diameter.  Liver: No focal mass lesion identified.  Within normal limits in parenchymal echogenicity.  IVC:  Appears normal.  Pancreas:  No abnormality identified.  Spleen:  Within normal limits in size and echotexture.  Right kidney:  Normal in size and parenchymal echogenicity.  No evidence of mass or hydronephrosis.  Left  kidney:  Normal in size and parenchymal echogenicity.  No evidence of mass or hydronephrosis.  Abdominal Aorta:  No aneurysm identified.  IMPRESSION:  1.  Small amount of gallbladder sludge noted, but no discrete gallstones or signs of acute cholecystitis. 2.  No evidence of biliary dilatation, hydronephrosis, or other significant abnormality.   Original Report Authenticated By: Myles Rosenthal, M.D.    Ct Abdomen Pelvis W Contrast  01/24/2013  *RADIOLOGY REPORT*  Clinical Data: Acute severe lower abdominal pain for 5 days.  Prior hysterectomy.  CT ABDOMEN AND PELVIS WITH CONTRAST  Technique:  Multidetector CT imaging of the abdomen and pelvis was performed following the standard protocol during bolus administration of intravenous contrast.  Contrast: OMNIPAQUE IOHEXOL 300 MG/ML  SOLN, 50mL OMNIPAQUE IOHEXOL 300 MG/ML  SOLN  Comparison: None  Findings: Lung bases are unremarkable.  No focal abnormality identified within the liver, spleen, pancreas, adrenal glands, or kidneys.  Possible intrarenal calculus identified in the mid pole region of the right kidney measures 3 mm in diameter.  The stomach, small bowel loops have a normal appearance. The appendix is well seen and has a normal appearance.  Colonic loops are normal in appearance.  The uterus is absent.  Probable left ovarian follicle is 2.0 cm. No free pelvic fluid or adenopathy. No evidence for aortic aneurysm.  Visualized osseous structures have a normal appearance.  IMPRESSION:  1.  No evidence for  acute abnormality of the abdomen or pelvis. 2.  Probable intrarenal calculus in the right kidney, 3 mm in diameter.  No urinary tract obstruction. 3.  Normal appendix. 4.  Probable left ovarian follicle, 2 cm in diameter.   Original Report Authenticated By: Norva Pavlov, M.D.     Anti-infectives: Anti-infectives   Start     Dose/Rate Route Frequency Ordered Stop   01/24/13 2000  piperacillin-tazobactam (ZOSYN) IVPB 3.375 g  Status:  Discontinued      3.375 g 12.5 mL/hr over 240 Minutes Intravenous Every 8 hours 01/24/13 1932 01/25/13 2106   01/24/13 1930  piperacillin-tazobactam (ZOSYN) IVPB 3.375 g  Status:  Discontinued     3.375 g 12.5 mL/hr over 240 Minutes Intravenous 4 times per day 01/24/13 1923 01/24/13 1930      Assessment/Plan: Abdominal pain/Nausea  Pain better controlled with toradol but still occasionally nauseous. Antiemetics help Etiology of pain is unclear. Labs still remain normal. Pt frustrated with long work-up. As i explained to her at length last night and again this am sometimes it takes a few days to rule things in/out. Pt concerned about length of workup and being in hospital; express thoughts about continuing workup as outpt. Explained that was an option but workup would take longer as outpt.   Still unclear if true biliary colic Urology to see - spoke with Dr Annabell Howells F/u pelvic ultrasound HIDA in am; explained to pt that she will need to be NPO and no narcotics prior to test  Mary Sella. Andrey Campanile, MD, FACS General, Bariatric, & Minimally Invasive Surgery [facility], [place]   LOS: 2 days    Atilano Ina 01/26/2013

## 2013-01-26 NOTE — Consult Note (Signed)
Subjective: I was asked see Deanna George in consultation by Drs. Loreta Ave and Andrey Campanile.   She is a 47 yo WF admitted on Friday with severe abdominal pain, Nausea with vomiting and a fever.   She has a history of interstitial cystitis and was treated with a hydrodistention and bladder irrigations in 2005-6.  She did well with that but has had some increased frequency and nocturia that has been chronic.  She was seen in our office by Dr. Isabel Caprice in 11/13 and had a negative urine then.  She began to have low back pain with increased frequency about a week ago and saw her PCP, but she had a negative UA.  The symptoms continued and she saw Jetta Lout NP in our office on Tuesday and was given a Marcaine bladder instillation with some relief of her bladder symptoms.  Her urine was clear then.   She had increased pain that was difficult to localize but seemed to be concentrated in the right suprapubic area and right upper quadrant.  She Dr Audie Box on [facility] for increased pain and nausea and he was going to do a pelvic US but the machine was not available.   She has a history of endometriosis with prior laparoscopy x 2 and a LAVH in 2009 for heavy bleeding and multiple fibroids.  She had relief from those symptoms at the time.   She was then seen by GI for the persistant symptoms and was admitted for further evaluation and management.   During this hospitalization she has been on zosyn and dilaudid.  She has had a CT AP that was unremarkable.  She has had an abdominal US that showed some sludge in the gall bladder.   She has had transabdominal and transvaginal pelvic US's that show a 1.5cm left ovarian follicle but no other abnormalities.    She continues to have pain that she reports as 6-7/10 but she does not appear to be uncomfortable during our discussion.  She continues to have some frequency and reports significant stress incontinence.  She continues to report the pain as being on the right both low and in the upper quadrant.     ROS: Negative except as above and as noted.   She denies constipation, but has had a little diarrhea today.   She reports her temp was up to 102 but she is afebrile today.  She has occasional radiation of pain from the right hip to the knee, but no other symptoms to suggest a radicular pain.  She has no skin complaints or lesions to suggest shingles.  She has had no hematuria, vaginal bleeding or bloody stools.  She has had no edema, chest pain or shortness of breath.    Past Medical History  Diagnosis Date  . Hypertension   . Interstitial cystitis   . Pneumonia   . Urinary tract infection   . GERD (gastroesophageal reflux disease)   . Esophageal stricture   . IBS (irritable bowel syndrome)   . Hemorrhoids   . Anal fissure   . Palpitations   . OA (osteoarthritis) of knee   . Cervical disc disease   . Noncompliance   . Situational stress    Past Surgical History  Procedure Laterality Date  . Laparoscopic vaginal hysterectomy      02/2008  . Laparoscopic surgery  01/2007    uterus and ovary x 2  . Cesarean section  [id number]    x2  . Foot pin post fracture  09/2008    Left foot  . Colonoscopy    . Knee arthroscopy  2011     right  . US echocardiography  07/31/2008    EF 55-60% with mild LVH   History   Social History  . Marital Status: Legally Separated    Spouse Name: N/A    Number of Children: N/A  . Years of Education: N/A   Occupational History  . Not on file.   Social History Main Topics  . Smoking status: Never Smoker   . Smokeless tobacco: Never Used  . Alcohol Use: No  . Drug Use: No  . Sexually Active: No   Other Topics Concern  . Not on file   Social History Narrative  . No narrative on file  .  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     Her father is a retired Development worker, community.   Family History  Problem Relation Age of Onset  . Adopted: Yes    Objective: Vital signs in last 24 hours: Temp:  [98 F (36.7  C)-98.7 F (37.1 C)] 98 F (36.7 C) (03/16 0705) Pulse Rate:  [60-79] 60 (03/16 0705) Resp:  [16] 16 (03/16 0705) BP: (108-133)/(62-79) 129/63 mmHg (03/16 0705) SpO2:  [97 %-100 %] 100 % (03/16 0705)  Intake/Output from previous day: 03/15 0701 - 03/16 0700 In: 5158.3 [P.O.:860; I.V.:4196.3; IV Piggyback:102] Out: -  Intake/Output this shift:    General appearance: alert, no distress and somewhat flat but pressured affect Head: Normocephalic, without obvious abnormality, atraumatic Neck: supple, symmetrical, trachea midline and thyroid not enlarged, symmetric, no tenderness/mass/nodules Back: no tenderness to percussion or palpation, range of motion normal, symmetric, no curvature. ROM normal. No CVA tenderness. Resp: clear to auscultation bilaterally Cardio: regular rate and rhythm GI: Soft with + BS.  There are no masses, hernias or HSM.  She has some tenderness under the right costal margin that she reports as moderate to severe, but there is no obvious Murphy's sign.   There is mild to moderate low RLQ tenderness but there is no guarding or rebound.  Pelvic: external genitalia normal and The urethral meatus is normal.  She has mild urethral tenderness without mass.  The bladder has moderate tenderness right > left without distention or mass and the pain was more of an urge to void.  There is no left side wall or adnexal mass.   There is tenderness of the right pelvic side wall that seems to be in the levator complex but merges with the bladder.  No right adnexal masses are noted.   The cervix and uterus are absent.  The vaginal vault has a normal length without stenosis and the vaginal mucosa is normal.   Extremities: extremities normal, atraumatic, no cyanosis or edema and there is no pain with straight leg raising Skin: Skin color, texture, turgor normal. No rashes or lesions Lymph nodes: Inguinal and femoral nodes are negative.  Neurologic: Mental status: Alert, oriented, thought  content appropriate, affect: flat Sensory: normal Motor: grossly normal  Lab Results:   Recent Labs  01/25/13 0410 01/26/13 0408  WBC 9.4 5.1  HGB 11.9* 12.0  HCT 34.7* 35.6*  PLT 179 170   BMET  Recent Labs  01/25/13 0410 01/26/13 0408  NA 133* 138  K 3.4* 4.2  CL 99 106  CO2 24 26  GLUCOSE 142* 109*  BUN 7 4*  CREATININE 0.76 0.75  CALCIUM 8.7 8.5   PT/INR  Recent Labs  01/24/13 1956  LABPROT 13.2  INR 1.01   ABG No results found for this basename: PHART, PCO2, PO2, HCO3,  in the last 72 hours  Studies/Results: US Abdomen Complete  01/25/2013  *RADIOLOGY REPORT*  Clinical Data:  Right upper quadrant abdominal pain.  ABDOMINAL ULTRASOUND COMPLETE  Comparison:  CT on 01/24/2013  Findings:  Gallbladder:  A small amount of layering echogenic sludge is seen in the gallbladder, but no discrete gallstones are identified. There is no evidence of gallbladder wall thickening or pericholecystic fluid.  Common Bile Duct:  Within normal limits in caliber. Measures 6 mm in diameter.  Liver: No focal mass lesion identified.  Within normal limits in parenchymal echogenicity.  IVC:  Appears normal.  Pancreas:  No abnormality identified.  Spleen:  Within normal limits in size and echotexture.  Right kidney:  Normal in size and parenchymal echogenicity.  No evidence of mass or hydronephrosis.  Left kidney:  Normal in size and parenchymal echogenicity.  No evidence of mass or hydronephrosis.  Abdominal Aorta:  No aneurysm identified.  IMPRESSION:  1.  Small amount of gallbladder sludge noted, but no discrete gallstones or signs of acute cholecystitis. 2.  No evidence of biliary dilatation, hydronephrosis, or other significant abnormality.   Original Report Authenticated By: Myles Rosenthal, M.D.    US Transvaginal Non-ob  01/26/2013  *RADIOLOGY REPORT*  Clinical Data: 46 year old female with pelvic pain, nausea and pelvic distention. History of hysterectomy.  TRANSABDOMINAL AND TRANSVAGINAL  ULTRASOUND OF PELVIS Technique:  Both transabdominal and transvaginal ultrasound examinations of the pelvis were performed. Transabdominal technique was performed for global imaging of the pelvis including uterus, ovaries, adnexal regions, and pelvic cul-de-sac.  It was necessary to proceed with endovaginal exam following the transabdominal exam to visualize the the ovaries and endometrium.  Comparison:  01/24/2013 CT  Findings:  Uterus: The uterus is not visualized compatible with hysterectomy.  Right ovary:  The right ovary is unremarkable measuring 2.3 x 1.9 x 2.4 cm.  Left ovary: The left ovary is unremarkable measuring 3 x 1.7 x 2.1 cm.  A 1.5 cm follicle is present.  Other findings: There is no evidence of adnexal mass or free fluid.  IMPRESSION: Status post hysterectomy, otherwise unremarkable exam. No evidence of pelvic mass or other significant abnormality.   Original Report Authenticated By: Harmon Pier, M.D.    US Pelvis Complete  01/26/2013  *RADIOLOGY REPORT*  Clinical Data: 47 year old female with pelvic pain, nausea and pelvic distention. History of hysterectomy.  TRANSABDOMINAL AND TRANSVAGINAL ULTRASOUND OF PELVIS Technique:  Both transabdominal and transvaginal ultrasound examinations of the pelvis were performed. Transabdominal technique was performed for global imaging of the pelvis including uterus, ovaries, adnexal regions, and pelvic cul-de-sac.  It was necessary to proceed with endovaginal exam following the transabdominal exam to visualize the the ovaries and endometrium.  Comparison:  01/24/2013 CT  Findings:  Uterus: The uterus is not visualized compatible with hysterectomy.  Right ovary:  The right ovary is unremarkable measuring 2.3 x 1.9 x 2.4 cm.  Left ovary: The left ovary is unremarkable measuring 3 x 1.7 x 2.1 cm.  A 1.5 cm follicle is present.  Other findings: There is no evidence of adnexal mass or free fluid.  IMPRESSION: Status post hysterectomy, otherwise unremarkable exam.  No evidence of pelvic mass or other significant abnormality.   Original Report Authenticated By: Harmon Pier, M.D.    Ct Abdomen Pelvis W Contrast  01/24/2013  *RADIOLOGY REPORT*  Clinical Data: Acute  severe lower abdominal pain for 5 days.  Prior hysterectomy.  CT ABDOMEN AND PELVIS WITH CONTRAST  Technique:  Multidetector CT imaging of the abdomen and pelvis was performed following the standard protocol during bolus administration of intravenous contrast.  Contrast: OMNIPAQUE IOHEXOL 300 MG/ML  SOLN, 50mL OMNIPAQUE IOHEXOL 300 MG/ML  SOLN  Comparison: None  Findings: Lung bases are unremarkable.  No focal abnormality identified within the liver, spleen, pancreas, adrenal glands, or kidneys.  Possible intrarenal calculus identified in the mid pole region of the right kidney measures 3 mm in diameter.  The stomach, small bowel loops have a normal appearance. The appendix is well seen and has a normal appearance.  Colonic loops are normal in appearance.  The uterus is absent.  Probable left ovarian follicle is 2.0 cm. No free pelvic fluid or adenopathy. No evidence for aortic aneurysm.  Visualized osseous structures have a normal appearance.  IMPRESSION:  1.  No evidence for acute abnormality of the abdomen or pelvis. 2.  Probable intrarenal calculus in the right kidney, 3 mm in diameter.  No urinary tract obstruction. 3.  Normal appendix. 4.  Probable left ovarian follicle, 2 cm in diameter.   Original Report Authenticated By: Norva Pavlov, M.D.     Anti-infectives: Anti-infectives   Start     Dose/Rate Route Frequency Ordered Stop   01/24/13 2000  piperacillin-tazobactam (ZOSYN) IVPB 3.375 g  Status:  Discontinued     3.375 g 12.5 mL/hr over 240 Minutes Intravenous Every 8 hours 01/24/13 1932 01/25/13 2106   01/24/13 1930  piperacillin-tazobactam (ZOSYN) IVPB 3.375 g  Status:  Discontinued     3.375 g 12.5 mL/hr over 240 Minutes Intravenous 4 times per day 01/24/13 1923 01/24/13 1930       Current Facility-Administered Medications  Medication Dose Route Frequency Provider Last Rate Last Dose  . [START ON 01/27/2013] acetaminophen (OFIRMEV) IV 1,000 mg  1,000 mg Intravenous Q6H Atilano Ina, MD      . acetaminophen (TYLENOL) tablet 650 mg  650 mg Oral Q6H PRN Amy S Esterwood, PA-C       Or  . acetaminophen (TYLENOL) suppository 650 mg  650 mg Rectal Q6H PRN Amy S Esterwood, PA-C      . dextrose 5 % and 0.45 % NaCl with KCl 10 mEq/L infusion   Intravenous Continuous Amy S Esterwood, PA-C 125 mL/hr at 01/26/13 0301    . enoxaparin (LOVENOX) injection 40 mg  40 mg Subcutaneous Q24H Atilano Ina, MD   40 mg at 01/26/13 0926  . HYDROcodone-acetaminophen (NORCO/VICODIN) 5-325 MG per tablet 1-2 tablet  1-2 tablet Oral Q4H PRN Atilano Ina, MD   2 tablet at 01/24/13 2150  . HYDROmorphone (DILAUDID) injection 2 mg  2 mg Intravenous Q3H PRN Atilano Ina, MD   2 mg at 01/26/13 4098  . ketorolac (TORADOL) 30 MG/ML injection 30 mg  30 mg Intravenous Q8H Atilano Ina, MD   30 mg at 01/26/13 [phone]  . LORazepam (ATIVAN) injection 1 mg  1 mg Intravenous Q4H PRN Charna Elizabeth, MD   1 mg at 01/25/13 2059  . losartan (COZAAR) tablet 50 mg  50 mg Oral BID Amy S Esterwood, PA-C   50 mg at 01/26/13 4782  . ondansetron (ZOFRAN) injection 4 mg  4 mg Intravenous Q6H PRN Amy S Esterwood, PA-C   4 mg at 01/26/13 0926  . pantoprazole (PROTONIX) injection 40 mg  40 mg Intravenous Daily Atilano Ina, MD  40 mg at 01/26/13 0927  . promethazine (PHENERGAN) injection 12.5 mg  12.5 mg Intravenous Q4H PRN Charna Elizabeth, MD   12.5 mg at 01/25/13 2059  . SUMAtriptan (IMITREX) tablet 25 mg  25 mg Oral Q8H PRN Charna Elizabeth, MD   25 mg at 01/25/13 2130    Assessment: She has a complicated pain complex with pain by report and on palpation in the right upper quadrant beneath the costal margin which certainly be consistent with gall bladder pain.  The nausea and vomiting would be more consistent with that source than  the urinary bladder in my experience.  She does have pelvic pain with both a bladder and right levator component.  She has a long history of IC but I am not sure the right side wall pain is bladder related but is more related to the levator complex.  Her reports of pain severity are not consistent with her physical exam findings and general appearance and I believe there is a component of anxiety that might be exacerbating her symptoms.   Plan: She will probably benefit from a repeat hydrodistention of the bladder for further evaluation and treatment of her IC related symptoms, but I think she needs to have further evaluation with the planned HIDA scan prior to committing to that procedure.   If she needs a cholecystectomy, they cystoscopy and hydrodistention could be done simultaneously.   I would like to start her on low dose diazepam which may help the pelvic floor pain with its muscle relaxing effect and reduce the anxiety as well.  I will give her 5mg  q6hr prn.   CC: Dr. Ellis Savage, Dr. Gaynelle Adu, Dr. Chiquita Loth, Dr Barron Alvine and [facility].    LOS: 2 days    Anner Crete 01/26/2013

## 2013-01-26 NOTE — Progress Notes (Signed)
Subjective:Cross cover [facility] Since I last evaluated the patient, there has not been much change in her condition. Input from Dr. Annabell Howells and Dr. Andrey Campanile appreciated. She is scheduled for a HIDA scan tomorrow.   Objective: Vital signs in last 24 hours: Temp:  [98 F (36.7 C)-99.9 F (37.7 C)] 99.9 F (37.7 C) (03/16 1400) Pulse Rate:  [60-84] 84 (03/16 1400) Resp:  [16-18] 18 (03/16 1400) BP: (129-138)/(63-79) 138/74 mmHg (03/16 1400) SpO2:  [97 %-100 %] 98 % (03/16 1400)   Intake/Output from previous day: 03/15 0701 - 03/16 0700 In: 5158.3 [P.O.:860; I.V.:4196.3; IV Piggyback:102] Out: -  Intake/Output this shift:   General appearance: alert, cooperative, appears stated age, flushed, no distress and mildly obese Resp: clear to auscultation bilaterally Cardio: regular rate and rhythm, S1, S2 normal, no murmur, click, rub or gallop GI: soft, non-tender; bowel sounds normal; no masses,  no organomegaly Extremities: extremities normal, atraumatic, no cyanosis or edema  Lab Results:  Recent Labs  01/24/13 1956 01/25/13 0410 01/26/13 0408  WBC 9.0 9.4 5.1  HGB 12.8 11.9* 12.0  HCT 36.7 34.7* 35.6*  PLT 197 179 170   BMET  Recent Labs  01/24/13 1956 01/25/13 0410 01/26/13 0408  NA 137 133* 138  K 3.3* 3.4* 4.2  CL 102 99 106  CO2 24 24 26   GLUCOSE 100* 142* 109*  BUN 8 7 4*  CREATININE 0.76 0.76 0.75  CALCIUM 9.2 8.7 8.5   LFT  Recent Labs  01/26/13 0408  PROT 5.7*  ALBUMIN 3.1*  AST 24  ALT 24  ALKPHOS 48  BILITOT 0.4   PT/INR  Recent Labs  01/24/13 1956  LABPROT 13.2  INR 1.01   Hepatitis Panel No results found for this basename: HEPBSAG, HCVAB, HEPAIGM, HEPBIGM,  in the last 72 hours C-Diff No results found for this basename: CDIFFTOX,  in the last 72 hours Fecal Lactopherrin No results found for this basename: FECLLACTOFRN,  in the last 72 hours  Studies/Results: US Abdomen Complete  01/25/2013  *RADIOLOGY REPORT*  Clinical Data:  Right  upper quadrant abdominal pain.  ABDOMINAL ULTRASOUND COMPLETE  Comparison:  CT on 01/24/2013  Findings:  Gallbladder:  A small amount of layering echogenic sludge is seen in the gallbladder, but no discrete gallstones are identified. There is no evidence of gallbladder wall thickening or pericholecystic fluid.  Common Bile Duct:  Within normal limits in caliber. Measures 6 mm in diameter.  Liver: No focal mass lesion identified.  Within normal limits in parenchymal echogenicity.  IVC:  Appears normal.  Pancreas:  No abnormality identified.  Spleen:  Within normal limits in size and echotexture.  Right kidney:  Normal in size and parenchymal echogenicity.  No evidence of mass or hydronephrosis.  Left kidney:  Normal in size and parenchymal echogenicity.  No evidence of mass or hydronephrosis.  Abdominal Aorta:  No aneurysm identified.  IMPRESSION:  1.  Small amount of gallbladder sludge noted, but no discrete gallstones or signs of acute cholecystitis. 2.  No evidence of biliary dilatation, hydronephrosis, or other significant abnormality.   Original Report Authenticated By: Myles Rosenthal, M.D.    US Transvaginal Non-ob  01/26/2013  *RADIOLOGY REPORT*  Clinical Data: 47 year old female with pelvic pain, nausea and pelvic distention. History of hysterectomy.  TRANSABDOMINAL AND TRANSVAGINAL ULTRASOUND OF PELVIS Technique:  Both transabdominal and transvaginal ultrasound examinations of the pelvis were performed. Transabdominal technique was performed for global imaging of the pelvis including uterus, ovaries, adnexal regions, and pelvic cul-de-sac.  It was necessary to proceed with endovaginal exam following the transabdominal exam to visualize the the ovaries and endometrium.  Comparison:  01/24/2013 CT  Findings:  Uterus: The uterus is not visualized compatible with hysterectomy.  Right ovary:  The right ovary is unremarkable measuring 2.3 x 1.9 x 2.4 cm.  Left ovary: The left ovary is unremarkable measuring 3 x 1.7  x 2.1 cm.  A 1.5 cm follicle is present.  Other findings: There is no evidence of adnexal mass or free fluid.  IMPRESSION: Status post hysterectomy, otherwise unremarkable exam. No evidence of pelvic mass or other significant abnormality.   Original Report Authenticated By: Harmon Pier, M.D.    US Pelvis Complete  01/26/2013  *RADIOLOGY REPORT*  Clinical Data: 47 year old female with pelvic pain, nausea and pelvic distention. History of hysterectomy.  TRANSABDOMINAL AND TRANSVAGINAL ULTRASOUND OF PELVIS Technique:  Both transabdominal and transvaginal ultrasound examinations of the pelvis were performed. Transabdominal technique was performed for global imaging of the pelvis including uterus, ovaries, adnexal regions, and pelvic cul-de-sac.  It was necessary to proceed with endovaginal exam following the transabdominal exam to visualize the the ovaries and endometrium.  Comparison:  01/24/2013 CT  Findings:  Uterus: The uterus is not visualized compatible with hysterectomy.  Right ovary:  The right ovary is unremarkable measuring 2.3 x 1.9 x 2.4 cm.  Left ovary: The left ovary is unremarkable measuring 3 x 1.7 x 2.1 cm.  A 1.5 cm follicle is present.  Other findings: There is no evidence of adnexal mass or free fluid.  IMPRESSION: Status post hysterectomy, otherwise unremarkable exam. No evidence of pelvic mass or other significant abnormality.   Original Report Authenticated By: Harmon Pier, M.D.    Ct Abdomen Pelvis W Contrast  01/24/2013  *RADIOLOGY REPORT*  Clinical Data: Acute severe lower abdominal pain for 5 days.  Prior hysterectomy.  CT ABDOMEN AND PELVIS WITH CONTRAST  Technique:  Multidetector CT imaging of the abdomen and pelvis was performed following the standard protocol during bolus administration of intravenous contrast.  Contrast: OMNIPAQUE IOHEXOL 300 MG/ML  SOLN, 50mL OMNIPAQUE IOHEXOL 300 MG/ML  SOLN  Comparison: None  Findings: Lung bases are unremarkable.  No focal abnormality  identified within the liver, spleen, pancreas, adrenal glands, or kidneys.  Possible intrarenal calculus identified in the mid pole region of the right kidney measures 3 mm in diameter.  The stomach, small bowel loops have a normal appearance. The appendix is well seen and has a normal appearance.  Colonic loops are normal in appearance.  The uterus is absent.  Probable left ovarian follicle is 2.0 cm. No free pelvic fluid or adenopathy. No evidence for aortic aneurysm.  Visualized osseous structures have a normal appearance.  IMPRESSION:  1.  No evidence for acute abnormality of the abdomen or pelvis. 2.  Probable intrarenal calculus in the right kidney, 3 mm in diameter.  No urinary tract obstruction. 3.  Normal appendix. 4.  Probable left ovarian follicle, 2 cm in diameter.   Original Report Authenticated By: Norva Pavlov, M.D.    Medications: I have reviewed the patient's current medications.  Assessment/Plan: 1) RUQ pain and nausea, worse postprandially;sludge on abdominal US. Will await the results of the HIDA scan and follow.  2) IC: being followed by Dr. Isabel Caprice. Patient wants to have her "bladder stretched" before she goes home.     LOS: 2 days   Matheu Ploeger 01/26/2013, 4:57 PM

## 2013-01-27 ENCOUNTER — Ambulatory Visit: Payer: BC Managed Care – PPO | Admitting: Gynecology

## 2013-01-27 ENCOUNTER — Inpatient Hospital Stay (HOSPITAL_COMMUNITY): Payer: BC Managed Care – PPO

## 2013-01-27 DIAGNOSIS — N3010 Interstitial cystitis (chronic) without hematuria: Secondary | ICD-10-CM

## 2013-01-27 DIAGNOSIS — R1011 Right upper quadrant pain: Secondary | ICD-10-CM

## 2013-01-27 DIAGNOSIS — R110 Nausea: Secondary | ICD-10-CM

## 2013-01-27 MED ORDER — TECHNETIUM TC 99M MEBROFENIN IV KIT
5.5000 | PACK | Freq: Once | INTRAVENOUS | Status: AC | PRN
  Administered 2013-01-27: 6 via INTRAVENOUS

## 2013-01-27 MED ORDER — HYDROMORPHONE HCL PF 1 MG/ML IJ SOLN: 1.0000 mg | INTRAMUSCULAR | Status: DC | PRN

## 2013-01-27 NOTE — Progress Notes (Addendum)
[facility] Progress Note  Subjective: Had HIDA this am Patient frustrated overall about progress made in the hospital this weekend, but today has been better for her after meeting with general surgery Considering cholecystectomy given abnormal biliary scintigraphy today Pain still after eating anything and also nausea, no vomiting Pain feels different than her previous interstitial cystitis pain and also different from her previous GERD  Objective:  Vital signs in last 24 hours: Temp:  [98 F (36.7 C)-98.2 F (36.8 C)] 98.2 F (36.8 C) (03/17 1327) Pulse Rate:  [57-83] 57 (03/17 1327) Resp:  [18-20] 18 (03/17 1327) BP: (94-142)/(47-85) 127/78 mmHg (03/17 1327) SpO2:  [98 %-100 %] 100 % (03/17 1327) Last BM Date: 01/27/13 General:   Alert,  Well-developed,  white female in NAD Heart:  Regular rate and rhythm Abdomen:  Soft, mild epigastric and RUQ tenderness, no rebound or guarding, nondistended. Normal bowel sounds Extremities:  Without edema. Neurologic:  Alert and  oriented x4;  grossly normal neurologically. Psych:  Alert and cooperative. Normal mood and affect.  Intake/Output from previous day: 03/16 0701 - 03/17 0700 In: 720 [P.O.:720] Out: -  Intake/Output this shift:    Lab Results:  Recent Labs  01/24/13 1956 01/25/13 0410 01/26/13 0408  WBC 9.0 9.4 5.1  HGB 12.8 11.9* 12.0  HCT 36.7 34.7* 35.6*  PLT 197 179 170   BMET  Recent Labs  01/24/13 1956 01/25/13 0410 01/26/13 0408  NA 137 133* 138  K 3.3* 3.4* 4.2  CL 102 99 106  CO2 24 24 26   GLUCOSE 100* 142* 109*  BUN 8 7 4*  CREATININE 0.76 0.76 0.75  CALCIUM 9.2 8.7 8.5   LFT  Recent Labs  01/26/13 0408  PROT 5.7*  ALBUMIN 3.1*  AST 24  ALT 24  ALKPHOS 48  BILITOT 0.4   PT/INR  Recent Labs  01/24/13 1956  LABPROT 13.2  INR 1.01   Hepatitis Panel No results found for this basename: HEPBSAG, HCVAB, HEPAIGM, HEPBIGM,  in the last 72 hours  Studies/Results: US  Transvaginal Non-ob  01/26/2013  *RADIOLOGY REPORT*  Clinical Data: 47 year old female with pelvic pain, nausea and pelvic distention. History of hysterectomy.  TRANSABDOMINAL AND TRANSVAGINAL ULTRASOUND OF PELVIS Technique:  Both transabdominal and transvaginal ultrasound examinations of the pelvis were performed. Transabdominal technique was performed for global imaging of the pelvis including uterus, ovaries, adnexal regions, and pelvic cul-de-sac.  It was necessary to proceed with endovaginal exam following the transabdominal exam to visualize the the ovaries and endometrium.  Comparison:  01/24/2013 CT  Findings:  Uterus: The uterus is not visualized compatible with hysterectomy.  Right ovary:  The right ovary is unremarkable measuring 2.3 x 1.9 x 2.4 cm.  Left ovary: The left ovary is unremarkable measuring 3 x 1.7 x 2.1 cm.  A 1.5 cm follicle is present.  Other findings: There is no evidence of adnexal mass or free fluid.  IMPRESSION: Status post hysterectomy, otherwise unremarkable exam. No evidence of pelvic mass or other significant abnormality.   Original Report Authenticated By: Harmon Pier, M.D.    US Pelvis Complete  01/26/2013  *RADIOLOGY REPORT*  Clinical Data: 47 year old female with pelvic pain, nausea and pelvic distention. History of hysterectomy.  TRANSABDOMINAL AND TRANSVAGINAL ULTRASOUND OF PELVIS Technique:  Both transabdominal and transvaginal ultrasound examinations of the pelvis were performed. Transabdominal technique was performed for global imaging of the pelvis including uterus, ovaries, adnexal regions, and pelvic cul-de-sac.  It was necessary to proceed with endovaginal exam following  the transabdominal exam to visualize the the ovaries and endometrium.  Comparison:  01/24/2013 CT  Findings:  Uterus: The uterus is not visualized compatible with hysterectomy.  Right ovary:  The right ovary is unremarkable measuring 2.3 x 1.9 x 2.4 cm.  Left ovary: The left ovary is unremarkable  measuring 3 x 1.7 x 2.1 cm.  A 1.5 cm follicle is present.  Other findings: There is no evidence of adnexal mass or free fluid.  IMPRESSION: Status post hysterectomy, otherwise unremarkable exam. No evidence of pelvic mass or other significant abnormality.   Original Report Authenticated By: Harmon Pier, M.D.    Nm Hepato W/eject Fract  01/27/2013  *RADIOLOGY REPORT*  Clinical Data:  , pain, nausea diarrhea.  NUCLEAR MEDICINE HEPATOBILIARY IMAGING WITH GALLBLADDER EF  Technique:  Sequential images of the abdomen were obtained out to 60 minutes following intravenous administration of radiopharmaceutical. After oral ingestion of 8 ounces of Half-and- Half cream, gallbladder ejection fraction was determined.  Radiopharmaceutical:  5.5 mCi Tc-84m Choletec  Comparison:  CT abdomen and pelvis 01/24/2013 and abdominal ultrasound 01/25/2013.  Findings: The liver demonstrates homogeneous, normal uptake.  The gallbladder is visualized within 10 minutes.  Radiotracer flows into the common bile duct and small bowel.  The patient did experience some cramping after oral ingestion of 8 ozs of Ensure cream. Gallbladder ejection fraction is calculated at 9.5% at 38 minutes.  IMPRESSION:  1.  Decreased gallbladder ejection fraction. The patient experienced cramping during calculation of gallbladder ejection fraction. 2.  Patent cystic duct.   Original Report Authenticated By: Holley Dexter, M.D.     Greater than 30 minutes spent one-on-one with the patient today discussing her symptoms and coordinating care  Assessment / Plan: 1.  RUQ pain/nausea -- HIDA scan is abnormal and diagnostic for biliary dyskinesia. After listening to her symptoms, her gallbladder may in fact explain her recent upper abdominal pain and nausea.  This is felt to be less likely gastritis or ulcer disease. I do think her interstitial cystitis is contributing to some of her abdominal complaints, and the patient agrees.  Usually her interstitial  cystitis pain is more lower and anterior as opposed to the more postprandial pain located in the upper right abdomen --We discussed cholecystectomy and how this may not resolve her pain, she is aware of this and still wishes to proceed with cholecystectomy.  It is my hope is that this will provide her with significant relief. --We briefly discussed upper endoscopy to exclude PUD, the patient feels very sure this is "not my stomach".  I do feel EGD would be low yield --Appreciate Dr. Ardine Eng input.   --Patient likely for cholecystectomy this hospitalization  2.  Interstitial cystitis -- being followed by urology in hospital with plans for hydrodistention of the bladder. Hopefully this too can be performed before discharge. Appreciate urology assistance  Will place patient on full liquid diet now.  Promethazine as needed for nausea and Dilaudid as needed for pain.  N.p.o. After midnight in case surgery has a spot for cholecystectomy  Active Problems:   * No active hospital problems. *     LOS: 3 days   Aceson Labell M  01/27/2013, 4:56 PM

## 2013-01-27 NOTE — Progress Notes (Signed)
Subjective: Complaining of some ongoing discomfort in RUQ and RLQ, but more towards the midline.  Also diarrhea. She reports 6 episodes yesterday.  Objective: Vital signs in last 24 hours: Temp:  [98 F (36.7 C)-99.9 F (37.7 C)] 98 F (36.7 C) (03/17 0630) Pulse Rate:  [61-84] 61 (03/17 0630) Resp:  [18-20] 20 (03/17 0630) BP: (94-142)/(47-84) 142/84 mmHg (03/17 0737) SpO2:  [98 %-100 %] 99 % (03/17 0630)  720 PO recorded,  Diet: NPO, afebrile, VSS, no labs today  Intake/Output from previous day: 03/16 0701 - 03/17 0700 In: 720 [P.O.:720] Out: -  Intake/Output this shift:    General appearance: alert, cooperative and no distress GI: soft, reports discomfort in her RUQ, but not significantly worse with palpatation , same in RLQ, more towards the midline.  Abd soft, +BS, no distension.  Lab Results:   Recent Labs  01/25/13 0410 01/26/13 0408  WBC 9.4 5.1  HGB 11.9* 12.0  HCT 34.7* 35.6*  PLT 179 170    BMET  Recent Labs  01/25/13 0410 01/26/13 0408  NA 133* 138  K 3.4* 4.2  CL 99 106  CO2 24 26  GLUCOSE 142* 109*  BUN 7 4*  CREATININE 0.76 0.75  CALCIUM 8.7 8.5   PT/INR  Recent Labs  01/24/13 1956  LABPROT 13.2  INR 1.01     Recent Labs Lab 01/24/13 1956 01/26/13 0408  AST 26 24  ALT 24 24  ALKPHOS 59 48  BILITOT 0.4 0.4  PROT 6.8 5.7*  ALBUMIN 4.0 3.1*     Lipase     Component Value Date/Time   LIPASE 19 01/25/2013 0410     Studies/Results: US Transvaginal Non-ob  01/26/2013  *RADIOLOGY REPORT*  Clinical Data: 47 year old female with pelvic pain, nausea and pelvic distention. History of hysterectomy.  TRANSABDOMINAL AND TRANSVAGINAL ULTRASOUND OF PELVIS Technique:  Both transabdominal and transvaginal ultrasound examinations of the pelvis were performed. Transabdominal technique was performed for global imaging of the pelvis including uterus, ovaries, adnexal regions, and pelvic cul-de-sac.  It was necessary to proceed with  endovaginal exam following the transabdominal exam to visualize the the ovaries and endometrium.  Comparison:  01/24/2013 CT  Findings:  Uterus: The uterus is not visualized compatible with hysterectomy.  Right ovary:  The right ovary is unremarkable measuring 2.3 x 1.9 x 2.4 cm.  Left ovary: The left ovary is unremarkable measuring 3 x 1.7 x 2.1 cm.  A 1.5 cm follicle is present.  Other findings: There is no evidence of adnexal mass or free fluid.  IMPRESSION: Status post hysterectomy, otherwise unremarkable exam. No evidence of pelvic mass or other significant abnormality.   Original Report Authenticated By: Harmon Pier, M.D.    US Pelvis Complete  01/26/2013  *RADIOLOGY REPORT*  Clinical Data: 47 year old female with pelvic pain, nausea and pelvic distention. History of hysterectomy.  TRANSABDOMINAL AND TRANSVAGINAL ULTRASOUND OF PELVIS Technique:  Both transabdominal and transvaginal ultrasound examinations of the pelvis were performed. Transabdominal technique was performed for global imaging of the pelvis including uterus, ovaries, adnexal regions, and pelvic cul-de-sac.  It was necessary to proceed with endovaginal exam following the transabdominal exam to visualize the the ovaries and endometrium.  Comparison:  01/24/2013 CT  Findings:  Uterus: The uterus is not visualized compatible with hysterectomy.  Right ovary:  The right ovary is unremarkable measuring 2.3 x 1.9 x 2.4 cm.  Left ovary: The left ovary is unremarkable measuring 3 x 1.7 x 2.1 cm.  A 1.5 cm follicle  is present.  Other findings: There is no evidence of adnexal mass or free fluid.  IMPRESSION: Status post hysterectomy, otherwise unremarkable exam. No evidence of pelvic mass or other significant abnormality.   Original Report Authenticated By: Harmon Pier, M.D.     Medications: . acetaminophen  1,000 mg Intravenous Q6H  . diazepam  5 mg Oral Q6H  . enoxaparin (LOVENOX) injection  40 mg Subcutaneous Q24H  . ketorolac  30 mg Intravenous  Q8H  . losartan  50 mg Oral BID  . pantoprazole (PROTONIX) IV  40 mg Intravenous Daily    Assessment/Plan ABDOMINAL Pain/Nausea admitted for pain control and evaluation Hx of lap hyst, and c-sections x 2.  interstitial cystitis/hx of uti.  (Dr Annabell Howells plans cystoscopy with hydrodistension of the bladder this week if she does not require cholecystectomy.)  Gallbladder sludge;  HIDA scan:  Decreased gallbladder ejection fraction. The patient experienced cramping during calculation of gallbladder ejection fraction.   Patent cystic duct.   Hypertension  GERD/IBS/Esophageal strictures/anal fissures/hemorrhoids Situational  Hx of noncompliance.   Plan:  Results just back from her HIDA.  Will discuss with Dr. Gerrit Friends.    LOS: 3 days    Deanna George 01/27/2013

## 2013-01-27 NOTE — Progress Notes (Signed)
Patient ID: Deanna George, female   DOB: [birth year], 47 y.o.   MRN: [id number]  Deanna George continues to have pain and nausea with vomiting that seems to be post prandial.  She had a HIDA scan that showed gallbladder dyskinesia with an EF of 9.5%.  She is leaning toward proceeding with a cholecystectomy.   At this point I think it would be best that we not confuse the issue with a bladder hydrodistention at the time of cholecystectomy and she should f/u in the next week or two with Dr. Isabel Caprice once he is back in town to see if that is still needed.  I don't see that the diazepam has made much difference but will leave that as a prn order.   Reconsult as needed.

## 2013-01-27 NOTE — Progress Notes (Signed)
General Surgery [facility]  Patient seen and examined.  Reviewed nuclear scan - no obstruction, but low ejection fraction consistent with biliary dyskinesia.  Patient complained of cramping and nausea with intake of Ensure.  Difficult case.  Doubt biliary dyskinesia can explain her multitude of symptoms.  Lengthy discussion with patient and I wrote down info about scan results for her to share with her father.  Certainly can consider laparoscopic cholecystectomy for biliary dyskinesia, but I believe most of her symptoms are not related to this disorder.  I think the chances of significant clinical improvement with lap chole are about 50/50.  Discussed the procedure with the patient.  Will await review of her studies by GI.  If the patient and gastroenterology feel that lap chole is indicated, we will be happy to prepare her for surgery.  Will continue to follow with you.  Velora Heckler, MD, [facility] Office: [phone]

## 2013-01-27 NOTE — Progress Notes (Signed)
Patient ID: Deanna George, female   DOB: [birth year], 47 y.o.   MRN: [id number]  Came to see patient but she had just left for HIDA scan.     Dr. Isabel Caprice is out of town this week.   If she isn't felt to need a cholecystectomy, I will try to get her set up for a cystoscopy with hydrodistention of the bladder this week.    On review of her med list, I don't see that she has required the dilaudid through the night.  Hopefully the valium is making a difference.  I will try to get by later today.

## 2013-01-27 NOTE — H&P (Signed)
Please see the History and Physical admission note as dictated by Mike Gip, PA-C.  Iva Boop, MD, [facility]

## 2013-01-28 ENCOUNTER — Inpatient Hospital Stay (HOSPITAL_COMMUNITY): Payer: BC Managed Care – PPO

## 2013-01-28 ENCOUNTER — Encounter (HOSPITAL_COMMUNITY)
Admission: AD | Disposition: A | Discharge status: Home / Self Care | Payer: Self-pay | Source: Ambulatory Visit | Attending: Internal Medicine

## 2013-01-28 ENCOUNTER — Inpatient Hospital Stay (HOSPITAL_COMMUNITY): Payer: BC Managed Care – PPO | Admitting: Anesthesiology

## 2013-01-28 ENCOUNTER — Encounter (HOSPITAL_COMMUNITY): Payer: Self-pay | Admitting: Anesthesiology

## 2013-01-28 DIAGNOSIS — R1032 Left lower quadrant pain: Secondary | ICD-10-CM

## 2013-01-28 HISTORY — PX: CHOLECYSTECTOMY: SHX55

## 2013-01-28 LAB — MRSA PCR SCREENING: MRSA by PCR: NEGATIVE

## 2013-01-28 SURGERY — LAPAROSCOPIC CHOLECYSTECTOMY WITH INTRAOPERATIVE CHOLANGIOGRAM
Anesthesia: General | Wound class: Clean Contaminated

## 2013-01-28 MED ORDER — SCOPOLAMINE 1 MG/3DAYS TD PT72
MEDICATED_PATCH | TRANSDERMAL | Status: AC
  Filled 2013-01-28: qty 1

## 2013-01-28 MED ORDER — CEFAZOLIN SODIUM-DEXTROSE 2-3 GM-% IV SOLR
INTRAVENOUS | Status: AC
  Filled 2013-01-28: qty 50

## 2013-01-28 MED ORDER — MIDAZOLAM HCL 2 MG/2ML IJ SOLN
0.5000 mg | Freq: Once | INTRAMUSCULAR | Status: AC | PRN
  Administered 2013-01-28 (×2): 0.5 mg via INTRAVENOUS
  Administered 2013-01-28: 1 mg via INTRAVENOUS

## 2013-01-28 MED ORDER — CEFAZOLIN SODIUM-DEXTROSE 2-3 GM-% IV SOLR
2.0000 g | INTRAVENOUS | Status: AC
  Administered 2013-01-28: 2 g via INTRAVENOUS
  Filled 2013-01-28: qty 50

## 2013-01-28 MED ORDER — MIDAZOLAM HCL 2 MG/2ML IJ SOLN
INTRAMUSCULAR | Status: AC
Start: 2013-01-28 — End: 2013-01-29
  Filled 2013-01-28: qty 2

## 2013-01-28 MED ORDER — KCL IN DEXTROSE-NACL 20-5-0.45 MEQ/L-%-% IV SOLN
INTRAVENOUS | Status: DC
  Administered 2013-01-28: 15:00:00 via INTRAVENOUS
  Administered 2013-01-29: 1000 mL via INTRAVENOUS
  Filled 2013-01-28 (×2): qty 1000

## 2013-01-28 MED ORDER — DEXAMETHASONE SODIUM PHOSPHATE 10 MG/ML IJ SOLN
INTRAMUSCULAR | Status: DC | PRN
  Administered 2013-01-28: 10 mg via INTRAVENOUS

## 2013-01-28 MED ORDER — ACETAMINOPHEN 325 MG PO TABS: 650.0000 mg | ORAL_TABLET | ORAL | Status: DC | PRN

## 2013-01-28 MED ORDER — MIDAZOLAM HCL 5 MG/5ML IJ SOLN
INTRAMUSCULAR | Status: DC | PRN
  Administered 2013-01-28: 2 mg via INTRAVENOUS

## 2013-01-28 MED ORDER — LIDOCAINE HCL (CARDIAC) 20 MG/ML IV SOLN
INTRAVENOUS | Status: DC | PRN
  Administered 2013-01-28: 100 mg via INTRAVENOUS

## 2013-01-28 MED ORDER — FENTANYL CITRATE 0.05 MG/ML IJ SOLN
INTRAMUSCULAR | Status: DC | PRN
  Administered 2013-01-28 (×5): 50 ug via INTRAVENOUS

## 2013-01-28 MED ORDER — ROCURONIUM BROMIDE 100 MG/10ML IV SOLN
INTRAVENOUS | Status: DC | PRN
  Administered 2013-01-28: 30 mg via INTRAVENOUS

## 2013-01-28 MED ORDER — LACTATED RINGERS IV SOLN
INTRAVENOUS | Status: DC | PRN
  Administered 2013-01-28: 11:00:00 via INTRAVENOUS

## 2013-01-28 MED ORDER — GLYCOPYRROLATE 0.2 MG/ML IJ SOLN
INTRAMUSCULAR | Status: DC | PRN
  Administered 2013-01-28: 0.6 mg via INTRAVENOUS

## 2013-01-28 MED ORDER — ACETAMINOPHEN 10 MG/ML IV SOLN
INTRAVENOUS | Status: AC
  Filled 2013-01-28: qty 100

## 2013-01-28 MED ORDER — LACTATED RINGERS IV SOLN
INTRAVENOUS | Status: DC | PRN
  Administered 2013-01-28: 1000 mL

## 2013-01-28 MED ORDER — PROPOFOL 10 MG/ML IV BOLUS
INTRAVENOUS | Status: DC | PRN
  Administered 2013-01-28: 150 mg via INTRAVENOUS

## 2013-01-28 MED ORDER — IOHEXOL 300 MG/ML  SOLN
INTRAMUSCULAR | Status: AC
  Filled 2013-01-28: qty 1

## 2013-01-28 MED ORDER — PROMETHAZINE HCL 25 MG/ML IJ SOLN
12.5000 mg | Freq: Four times a day (QID) | INTRAMUSCULAR | Status: DC | PRN
  Administered 2013-01-28: 12.5 mg via INTRAVENOUS
  Filled 2013-01-28: qty 1

## 2013-01-28 MED ORDER — LACTATED RINGERS IV SOLN
INTRAVENOUS | Status: DC
  Administered 2013-01-28: 13:00:00 via INTRAVENOUS

## 2013-01-28 MED ORDER — HYDROMORPHONE HCL PF 1 MG/ML IJ SOLN
INTRAMUSCULAR | Status: AC
  Filled 2013-01-28: qty 1

## 2013-01-28 MED ORDER — KETOROLAC TROMETHAMINE 30 MG/ML IJ SOLN
INTRAMUSCULAR | Status: AC
  Administered 2013-01-28: 30 mg
  Filled 2013-01-28: qty 1

## 2013-01-28 MED ORDER — 0.9 % SODIUM CHLORIDE (POUR BTL) OPTIME
TOPICAL | Status: DC | PRN
  Administered 2013-01-28: 1000 mL

## 2013-01-28 MED ORDER — ACETAMINOPHEN 10 MG/ML IV SOLN
INTRAVENOUS | Status: DC | PRN
  Administered 2013-01-28: 1000 mg via INTRAVENOUS

## 2013-01-28 MED ORDER — NEOSTIGMINE METHYLSULFATE 1 MG/ML IJ SOLN
INTRAMUSCULAR | Status: DC | PRN
  Administered 2013-01-28: 4 mg via INTRAVENOUS

## 2013-01-28 MED ORDER — SCOPOLAMINE 1 MG/3DAYS TD PT72
MEDICATED_PATCH | TRANSDERMAL | Status: DC | PRN
  Administered 2013-01-28: 1 via TRANSDERMAL

## 2013-01-28 MED ORDER — BUPIVACAINE-EPINEPHRINE (PF) 0.5% -1:200000 IJ SOLN
INTRAMUSCULAR | Status: AC
  Filled 2013-01-28: qty 10

## 2013-01-28 MED ORDER — HYDROCODONE-ACETAMINOPHEN 5-325 MG PO TABS
1.0000 | ORAL_TABLET | ORAL | Status: DC | PRN
  Administered 2013-01-28 – 2013-01-29 (×3): 2 via ORAL
  Filled 2013-01-28 (×3): qty 2

## 2013-01-28 MED ORDER — HYDROMORPHONE HCL PF 1 MG/ML IJ SOLN
1.0000 mg | INTRAMUSCULAR | Status: DC | PRN
  Administered 2013-01-28 – 2013-01-29 (×6): 1 mg via INTRAVENOUS
  Filled 2013-01-28 (×6): qty 1

## 2013-01-28 MED ORDER — PROMETHAZINE HCL 25 MG/ML IJ SOLN
6.2500 mg | INTRAMUSCULAR | Status: AC | PRN
  Administered 2013-01-28 (×2): 6.25 mg via INTRAVENOUS

## 2013-01-28 MED ORDER — SUCCINYLCHOLINE CHLORIDE 20 MG/ML IJ SOLN
INTRAMUSCULAR | Status: DC | PRN
  Administered 2013-01-28: 100 mg via INTRAVENOUS

## 2013-01-28 MED ORDER — ONDANSETRON HCL 4 MG/2ML IJ SOLN
INTRAMUSCULAR | Status: DC | PRN
  Administered 2013-01-28: 4 mg via INTRAVENOUS

## 2013-01-28 MED ORDER — HYDROMORPHONE HCL PF 1 MG/ML IJ SOLN
0.2500 mg | INTRAMUSCULAR | Status: DC | PRN
  Administered 2013-01-28: 0.5 mg via INTRAVENOUS
  Administered 2013-01-28 (×4): 0.25 mg via INTRAVENOUS
  Administered 2013-01-28: 0.5 mg via INTRAVENOUS

## 2013-01-28 MED ORDER — BUPIVACAINE-EPINEPHRINE 0.5% -1:200000 IJ SOLN
INTRAMUSCULAR | Status: DC | PRN
  Administered 2013-01-28: 20 mL

## 2013-01-28 MED ORDER — IOHEXOL 300 MG/ML  SOLN
INTRAMUSCULAR | Status: DC | PRN
  Administered 2013-01-28: 50 mL

## 2013-01-28 MED ORDER — PROMETHAZINE HCL 25 MG/ML IJ SOLN
INTRAMUSCULAR | Status: AC
  Filled 2013-01-28: qty 1

## 2013-01-28 SURGICAL SUPPLY — 42 items
APL SKNCLS STERI-STRIP NONHPOA (GAUZE/BANDAGES/DRESSINGS) ×1
APPLIER CLIP ROT 10 11.4 M/L (STAPLE) ×2
APR CLP MED LRG 11.4X10 (STAPLE) ×1
BAG SPEC RTRVL LRG 6X4 10 (ENDOMECHANICALS) ×1
BENZOIN TINCTURE PRP APPL 2/3 (GAUZE/BANDAGES/DRESSINGS) ×2 IMPLANT
CABLE HIGH FREQUENCY MONO STRZ (ELECTRODE) ×2 IMPLANT
CANISTER SUCTION 2500CC (MISCELLANEOUS) ×2 IMPLANT
CHLORAPREP W/TINT 26ML (MISCELLANEOUS) ×2 IMPLANT
CLIP APPLIE ROT 10 11.4 M/L (STAPLE) ×1 IMPLANT
CLOTH BEACON ORANGE TIMEOUT ST (SAFETY) ×2 IMPLANT
COVER MAYO STAND STRL (DRAPES) ×2 IMPLANT
DECANTER SPIKE VIAL GLASS SM (MISCELLANEOUS) ×2 IMPLANT
DRAPE C-ARM 42X72 X-RAY (DRAPES) ×2 IMPLANT
DRAPE LAPAROSCOPIC ABDOMINAL (DRAPES) ×2 IMPLANT
DRAPE UTILITY 15X26 (DRAPE) ×2 IMPLANT
ELECT REM PT RETURN 9FT ADLT (ELECTROSURGICAL) ×2
ELECTRODE REM PT RTRN 9FT ADLT (ELECTROSURGICAL) ×1 IMPLANT
GAUZE SPONGE 2X2 8PLY STRL LF (GAUZE/BANDAGES/DRESSINGS) IMPLANT
GLOVE BIOGEL PI IND STRL 7.0 (GLOVE) ×1 IMPLANT
GLOVE BIOGEL PI INDICATOR 7.0 (GLOVE) ×1
GLOVE SURG ORTHO 8.0 STRL STRW (GLOVE) ×2 IMPLANT
GOWN STRL NON-REIN LRG LVL3 (GOWN DISPOSABLE) ×2 IMPLANT
GOWN STRL REIN XL XLG (GOWN DISPOSABLE) ×4 IMPLANT
HEMOSTAT SURGICEL 4X8 (HEMOSTASIS) IMPLANT
KIT BASIN OR (CUSTOM PROCEDURE TRAY) ×2 IMPLANT
NS IRRIG 1000ML POUR BTL (IV SOLUTION) ×2 IMPLANT
POUCH SPECIMEN RETRIEVAL 10MM (ENDOMECHANICALS) ×2 IMPLANT
SCISSORS LAP 5X35 DISP (ENDOMECHANICALS) ×1 IMPLANT
SET CHOLANGIOGRAPH MIX (MISCELLANEOUS) ×2 IMPLANT
SET IRRIG TUBING LAPAROSCOPIC (IRRIGATION / IRRIGATOR) ×2 IMPLANT
SLEEVE Z-THREAD 5X100MM (TROCAR) ×2 IMPLANT
SOLUTION ANTI FOG 6CC (MISCELLANEOUS) ×2 IMPLANT
SPONGE GAUZE 2X2 STER 10/PKG (GAUZE/BANDAGES/DRESSINGS) ×1
STRIP CLOSURE SKIN 1/2X4 (GAUZE/BANDAGES/DRESSINGS) ×2 IMPLANT
SUT MNCRL AB 4-0 PS2 18 (SUTURE) ×2 IMPLANT
TAPE CLOTH SURG 4X10 WHT LF (GAUZE/BANDAGES/DRESSINGS) ×1 IMPLANT
TOWEL OR 17X26 10 PK STRL BLUE (TOWEL DISPOSABLE) ×6 IMPLANT
TRAY LAP CHOLE (CUSTOM PROCEDURE TRAY) ×2 IMPLANT
TROCAR XCEL BLUNT TIP 100MML (ENDOMECHANICALS) ×2 IMPLANT
TROCAR Z-THREAD FIOS 11X100 BL (TROCAR) ×2 IMPLANT
TROCAR Z-THREAD FIOS 5X100MM (TROCAR) ×4 IMPLANT
TUBING INSUFFLATION 10FT LAP (TUBING) ×2 IMPLANT

## 2013-01-28 NOTE — Progress Notes (Signed)
  Subjective: No real change, feels a little tender RUQ & RLQ.  Wants to go forward with cholecystectomy.  Objective: Vital signs in last 24 hours: Temp:  [97.8 F (36.6 C)-98.4 F (36.9 C)] 98.4 F (36.9 C) (03/18 0604) Pulse Rate:  [57-83] 80 (03/18 0604) Resp:  [18-20] 18 (03/18 0604) BP: (125-137)/(67-89) 125/67 mmHg (03/18 0604) SpO2:  [96 %-100 %] 96 % (03/18 0604) Last BM Date: 01/27/13 I/o not recorded. Afebrile, VSS, normal WBC, and lft's yesterday  Intake/Output from previous day:   Intake/Output this shift:    General appearance: alert, cooperative and no distress GI: soft, slightly-tender RUQ AND RLQ; bowel sounds normal; no masses,  no organomegaly  Lab Results:   Recent Labs  01/26/13 0408  WBC 5.1  HGB 12.0  HCT 35.6*  PLT 170    BMET  Recent Labs  01/26/13 0408  NA 138  K 4.2  CL 106  CO2 26  GLUCOSE 109*  BUN 4*  CREATININE 0.75  CALCIUM 8.5   PT/INR No results found for this basename: LABPROT, INR,  in the last 72 hours   Recent Labs Lab 01/24/13 1956 01/26/13 0408  AST 26 24  ALT 24 24  ALKPHOS 59 48  BILITOT 0.4 0.4  PROT 6.8 5.7*  ALBUMIN 4.0 3.1*     Lipase     Component Value Date/Time   LIPASE 19 01/25/2013 0410     Studies/Results: Nm Hepato W/eject Fract  01/27/2013  *RADIOLOGY REPORT*  Clinical Data:  , pain, nausea diarrhea.  NUCLEAR MEDICINE HEPATOBILIARY IMAGING WITH GALLBLADDER EF  Technique:  Sequential images of the abdomen were obtained out to 60 minutes following intravenous administration of radiopharmaceutical. After oral ingestion of 8 ounces of Half-and- Half cream, gallbladder ejection fraction was determined.  Radiopharmaceutical:  5.5 mCi Tc-35m Choletec  Comparison:  CT abdomen and pelvis 01/24/2013 and abdominal ultrasound 01/25/2013.  Findings: The liver demonstrates homogeneous, normal uptake.  The gallbladder is visualized within 10 minutes.  Radiotracer flows into the common bile duct and small  bowel.  The patient did experience some cramping after oral ingestion of 8 ozs of Ensure cream. Gallbladder ejection fraction is calculated at 9.5% at 38 minutes.  IMPRESSION:  1.  Decreased gallbladder ejection fraction. The patient experienced cramping during calculation of gallbladder ejection fraction. 2.  Patent cystic duct.   Original Report Authenticated By: Holley Dexter, M.D.     Medications: . enoxaparin (LOVENOX) injection  40 mg Subcutaneous Q24H  . ketorolac  30 mg Intravenous Q8H  . losartan  50 mg Oral BID  . pantoprazole (PROTONIX) IV  40 mg Intravenous Daily    Assessment/Plan ABDOMINAL Pain/Nausea admitted for pain control and evaluation  Hx of lap hyst, and c-sections x 2.  interstitial cystitis/hx of uti. (Dr Annabell Howells plans cystoscopy with hydrodistension of the bladder this week if she does not require cholecystectomy.)  Gallbladder sludge; HIDA scan: Decreased gallbladder ejection fraction. The patient experienced cramping during calculation of gallbladder ejection fraction. Patent cystic duct.  Hypertension  GERD/IBS/Esophageal strictures/anal fissures/hemorrhoids  Situational  Hx of noncompliance.  Plan:  Dr. Gerrit Friends will discuss with her this AM she is NPO.   LOS: 4 days    Deanna George 01/28/2013

## 2013-01-28 NOTE — Anesthesia Preprocedure Evaluation (Signed)
Anesthesia Evaluation  Patient identified by MRN, date of birth, ID band Patient awake    Reviewed: Allergy & Precautions, H&P , NPO status , Patient's Chart, lab work & pertinent test results  Airway Mallampati: II TM Distance: >3 FB Neck ROM: Full    Dental  (+) Teeth Intact and Dental Advisory Given   Pulmonary pneumonia -,  breath sounds clear to auscultation  Pulmonary exam normal       Cardiovascular hypertension, Pt. on medications Rhythm:Regular Rate:Normal     Neuro/Psych negative neurological ROS  negative psych ROS   GI/Hepatic Neg liver ROS, GERD-  Medicated,  Endo/Other  negative endocrine ROS  Renal/GU negative Renal ROS   Interstitial cystitis    Musculoskeletal negative musculoskeletal ROS (+)   Abdominal   Peds  Hematology negative hematology ROS (+)   Anesthesia Other Findings   Reproductive/Obstetrics negative OB ROS                           Anesthesia Physical Anesthesia Plan  ASA: II  Anesthesia Plan: General   Post-op Pain Management:    Induction: Intravenous  Airway Management Planned: Oral ETT  Additional Equipment:   Intra-op Plan:   Post-operative Plan: Extubation in OR  Informed Consent: I have reviewed the patients History and Physical, chart, labs and discussed the procedure including the risks, benefits and alternatives for the proposed anesthesia with the patient or authorized representative who has indicated his/her understanding and acceptance.   Dental advisory given  Plan Discussed with: CRNA  Anesthesia Plan Comments:         Anesthesia Quick Evaluation

## 2013-01-28 NOTE — Transfer of Care (Signed)
Immediate Anesthesia Transfer of Care Note  Patient: [name]  Procedure(s) Performed: Procedure(s): LAPAROSCOPIC CHOLECYSTECTOMY WITH INTRAOPERATIVE CHOLANGIOGRAM (N/A)  Patient Location: PACU  Anesthesia Type:General  Level of Consciousness: sedated  Airway & Oxygen Therapy: Patient Spontanous Breathing and Patient connected to face mask oxygen  Post-op Assessment: Report given to PACU RN and Post -op Vital signs reviewed and stable  Post vital signs: Reviewed and stable  Complications: No apparent anesthesia complications and Patient re-intubated

## 2013-01-28 NOTE — Progress Notes (Signed)
[facility] Progress Note  Subjective:  Wants gallbladder out while she is here.  Is frustrated.  Still feels about the same.  IV fell out this AM and has not been replaced yet.  Objective:  Vital signs in last 24 hours: Temp:  [97.8 F (36.6 C)-98.4 F (36.9 C)] 98.4 F (36.9 C) (03/18 0604) Pulse Rate:  [57-83] 80 (03/18 0604) Resp:  [18-20] 18 (03/18 0604) BP: (125-137)/(67-89) 125/67 mmHg (03/18 0604) SpO2:  [96 %-100 %] 96 % (03/18 0604) Last BM Date: 01/27/13 General:   Alert, Well-developed, in NAD Heart:  Regular rate and rhythm; no murmurs Pulm:  CTAB.  No W/R/R. Abdomen:  Soft, non-distended. Normal bowel sounds.  Mild epigastric/RUQ TTP without R/R/G. Extremities:  Without edema. Neurologic:  Alert and  oriented x4;  grossly normal neurologically. Psych:  Alert and cooperative. Normal mood and affect.  Lab Results:  Recent Labs  01/26/13 0408  WBC 5.1  HGB 12.0  HCT 35.6*  PLT 170   BMET  Recent Labs  01/26/13 0408  NA 138  K 4.2  CL 106  CO2 26  GLUCOSE 109*  BUN 4*  CREATININE 0.75  CALCIUM 8.5   LFT  Recent Labs  01/26/13 0408  PROT 5.7*  ALBUMIN 3.1*  AST 24  ALT 24  ALKPHOS 48  BILITOT 0.4   US Transvaginal Non-ob  01/26/2013  *RADIOLOGY REPORT*  Clinical Data: 47 year old female with pelvic pain, nausea and pelvic distention. History of hysterectomy.  TRANSABDOMINAL AND TRANSVAGINAL ULTRASOUND OF PELVIS Technique:  Both transabdominal and transvaginal ultrasound examinations of the pelvis were performed. Transabdominal technique was performed for global imaging of the pelvis including uterus, ovaries, adnexal regions, and pelvic cul-de-sac.  It was necessary to proceed with endovaginal exam following the transabdominal exam to visualize the the ovaries and endometrium.  Comparison:  01/24/2013 CT  Findings:  Uterus: The uterus is not visualized compatible with hysterectomy.  Right ovary:  The right ovary is unremarkable  measuring 2.3 x 1.9 x 2.4 cm.  Left ovary: The left ovary is unremarkable measuring 3 x 1.7 x 2.1 cm.  A 1.5 cm follicle is present.  Other findings: There is no evidence of adnexal mass or free fluid.  IMPRESSION: Status post hysterectomy, otherwise unremarkable exam. No evidence of pelvic mass or other significant abnormality.   Original Report Authenticated By: Harmon Pier, M.D.    US Pelvis Complete  01/26/2013  *RADIOLOGY REPORT*  Clinical Data: 47 year old female with pelvic pain, nausea and pelvic distention. History of hysterectomy.  TRANSABDOMINAL AND TRANSVAGINAL ULTRASOUND OF PELVIS Technique:  Both transabdominal and transvaginal ultrasound examinations of the pelvis were performed. Transabdominal technique was performed for global imaging of the pelvis including uterus, ovaries, adnexal regions, and pelvic cul-de-sac.  It was necessary to proceed with endovaginal exam following the transabdominal exam to visualize the the ovaries and endometrium.  Comparison:  01/24/2013 CT  Findings:  Uterus: The uterus is not visualized compatible with hysterectomy.  Right ovary:  The right ovary is unremarkable measuring 2.3 x 1.9 x 2.4 cm.  Left ovary: The left ovary is unremarkable measuring 3 x 1.7 x 2.1 cm.  A 1.5 cm follicle is present.  Other findings: There is no evidence of adnexal mass or free fluid.  IMPRESSION: Status post hysterectomy, otherwise unremarkable exam. No evidence of pelvic mass or other significant abnormality.   Original Report Authenticated By: Harmon Pier, M.D.    Nm Hepato W/eject Fract  01/27/2013  *RADIOLOGY REPORT*  Clinical  Data:  , pain, nausea diarrhea.  NUCLEAR MEDICINE HEPATOBILIARY IMAGING WITH GALLBLADDER EF  Technique:  Sequential images of the abdomen were obtained out to 60 minutes following intravenous administration of radiopharmaceutical. After oral ingestion of 8 ounces of Half-and- Half cream, gallbladder ejection fraction was determined.  Radiopharmaceutical:  5.5  mCi Tc-8m Choletec  Comparison:  CT abdomen and pelvis 01/24/2013 and abdominal ultrasound 01/25/2013.  Findings: The liver demonstrates homogeneous, normal uptake.  The gallbladder is visualized within 10 minutes.  Radiotracer flows into the common bile duct and small bowel.  The patient did experience some cramping after oral ingestion of 8 ozs of Ensure cream. Gallbladder ejection fraction is calculated at 9.5% at 38 minutes.  IMPRESSION:  1.  Decreased gallbladder ejection fraction. The patient experienced cramping during calculation of gallbladder ejection fraction. 2.  Patent cystic duct.   Original Report Authenticated By: Holley Dexter, M.D.     Assessment / Plan: 1. RUQ pain/nausea -- HIDA scan is abnormal and diagnostic for biliary dyskinesia. After listening to her symptoms, her gallbladder may in fact explain her recent upper abdominal pain and nausea. This is felt to be less likely gastritis or ulcer disease. I do think her interstitial cystitis is contributing to some of her abdominal complaints, and the patient agrees. Usually her interstitial cystitis pain is more lower and anterior as opposed to the more postprandial pain located in the upper right abdomen.  --She discussed cholecystectomy with Dr. Rhea Belton and how this may not resolve her pain, she is aware of this and still wishes to proceed with cholecystectomy.  Hopefully this will provide her with significant relief, but not guaranteed. --Upper endoscopy to exclude PUD was also discussed with Dr. Rhea Belton, the patient feels very sure this is "not my stomach". I do feel EGD would be low yield as well.  --Surgery to see patient back this AM and hopefully take her to surgery later today. -IV fell out this AM.  Will have it replaced by nursing in case she goes for surgery.  2. Interstitial cystitis -- seen by urology in the hospital.  Plan for outpatient follow-up with Dr. Isabel Caprice for hydro-distention of the bladder.    LOS: 4 days    Deanna George D.  01/28/2013, 9:04 AM  Pager number [phone]

## 2013-01-28 NOTE — Progress Notes (Signed)
Patient seen, examined, and I agree with the above documentation, including the assessment and plan. Patient going to preop for cholecystectomy today; appreciate surgical teams input and care for this patient Urology deferred hydrodistention of the bladder to the outpatient setting

## 2013-01-28 NOTE — Progress Notes (Signed)
General Surgery [facility]  Patient seen and examined again this AM.  Appreciate input from gastroenterology and urology.  Lengthy discussion of pros and cons of cholecystectomy.  Discussed laparoscopic procedure.  Discussed possibility of open surgery.  Discussed results to be expected.  Patient wishes to proceed with surgery today.  The risks and benefits of the procedure have been discussed at length with the patient.  The patient understands the proposed procedure, potential alternative treatments, and the course of recovery to be expected.  All of the patient's questions have been answered at this time.  The patient wishes to proceed with surgery.  Velora Heckler, MD, [facility] Office: [phone]

## 2013-01-28 NOTE — Anesthesia Postprocedure Evaluation (Signed)
Anesthesia Post Note  Patient: Deanna George  Procedure(s) Performed: Procedure(s) (LRB): LAPAROSCOPIC CHOLECYSTECTOMY WITH INTRAOPERATIVE CHOLANGIOGRAM (N/A)  Anesthesia type: General  Patient location: PACU  Post pain: Pain level controlled  Post assessment: Post-op Vital signs reviewed  Last Vitals:  Filed Vitals:   01/28/13 1345  BP: 148/71  Pulse: 65  Temp:   Resp: 16    Post vital signs: Reviewed  Level of consciousness: sedated  Complications: No apparent anesthesia complications

## 2013-01-28 NOTE — Op Note (Signed)
Procedure Note  Pre-operative Diagnosis:  Biliary dyskinesia, abdominal pain  Post-operative Diagnosis: Same  Surgeon:  Velora Heckler, MD, FACS  Procedure:  Laparoscopic cholecystectomy with intra-operative cholangiography  Assistant:  none   Anesthesia:  General  Indications: This patient presents with symptomatic gallbladder disease and will undergo laparoscopic cholecystectomy with intraoperative cholangiography.  Procedure Details: The patient was seen in the pre-op holding area. The risks, benefits, complications, treatment options, and expected outcomes have been discussed with the patient. The patient and/or family agreed with the proposed plan and signed the informed consent form.  The patient was taken to Operating Room, identified as Deanna George and the procedure verified as Laparoscopic Cholecystectomy with Intraoperative Cholangiogram. A "time out" was completed and the above information confirmed.  Prior to the induction of general anesthesia, antibiotic prophylaxis was administered. General endotracheal anesthesia was then administered and tolerated well. After the induction, the abdomen was prepped in the usual strict aseptic fashion. The patient was in the supine position.  An incision was made in the skin near the umbilicus. The midline fascia was incised and the peritoneal cavity entered and the Hasson canula was introduced under direct vision. Hasson canula was secured with a pursestring 0-Vicryl suture. Pneumoperitoneum was then established with carbon dioxide and tolerated well without any adverse changes in the patient's vital signs. Additional trocars were introduced under direct vision along the right costal margin in the midline, mid-clavicular line, and anterior axillary line.  The gallbladder was identified and the fundus grasped and retracted cephalad. Adhesions were taken down bluntly and with the electrocautery as needed, taking care not to injure any  adjacent structures. The infundibulum was grasped and retracted laterally, exposing the peritoneum overlying the triangle of Calot. This was incised and structures exposed in a blunt fashion. The cystic duct was clearly identified and bluntly dissected circumferentially and clipped at the neck of the gallbladder.  An incision was made in the cystic duct and the cholangiogram catheter introduced. The catheter was secured using an ligaclip.  Real-time cholangiography was performed using the C-arm.  There was rapid filling of a normal caliber common bile duct.  There was reflux of contrast into the left and right hepatic ductal systems.  There was free flow distally into the duodenum without filling defect or obstruction.  Catheter was removed from the peritoneal cavity.  The cystic duct was then triply ligated with surgical clips and divided. The cystic artery was identified, dissected circumferentially, ligated with ligaclips, and divided.   The gallbladder was dissected from the liver bed with the electrocautery used for hemostasis. The gallbladder was completely removed and placed into an endocatch bag. The right upper quadrant was irrigated and inspected. Hemostasis was achieved with the electrocautery. Warm saline irrigation was utilized and was repeatedly aspirated until clear.  Pneumoperitoneum was released after viewing removal of the trocars with good hemostasis noted. The umbilical wound was irrigated and the fascia was then closed with the pursestring suture.  The skin was then closed with 4-0 Monocril subcuticular sutures and sterile dressings were applied.  Instrument, sponge, and needle counts were correct at the conclusion of the case.  The patient tolerated the procedure well.  Estimated Blood Loss: Minimal         Drains: none         Specimens: Gallbladder to pathology         Disposition: PACU - hemodynamically stable.         Condition: stable   Huey Bienenstock.  Gerrit Friends, MD,  [facility] Office: [phone]

## 2013-01-29 ENCOUNTER — Encounter (HOSPITAL_COMMUNITY): Payer: Self-pay | Admitting: Surgery

## 2013-01-29 ENCOUNTER — Other Ambulatory Visit: Payer: Self-pay | Admitting: *Deleted

## 2013-01-29 DIAGNOSIS — R1031 Right lower quadrant pain: Secondary | ICD-10-CM

## 2013-01-29 DIAGNOSIS — K828 Other specified diseases of gallbladder: Principal | ICD-10-CM

## 2013-01-29 DIAGNOSIS — R52 Pain, unspecified: Secondary | ICD-10-CM

## 2013-01-29 MED ORDER — OXYCODONE-ACETAMINOPHEN 7.5-325 MG PO TABS: 1.0000 | ORAL_TABLET | ORAL | Status: DC | PRN

## 2013-01-29 MED ORDER — OXYCODONE HCL ER 10 MG PO T12A: 10.0000 mg | EXTENDED_RELEASE_TABLET | Freq: Two times a day (BID) | ORAL | Status: DC

## 2013-01-29 NOTE — Discharge Summary (Signed)
[facility] Discharge Summary  Name: EYANNA MCGONAGLE MRN: [id number] DOB: Sep 03, 1966 47 y.o. PCP:  Dara Lords, MD  Date of Admission: 01/24/2013  6:41 PM Date of Discharge: 01/29/2013 Attending Physician: Iva Boop, MD  Discharge Diagnosis: -Abdominal pain and nausea possibly secondary to biliary dyskinesia.  Active Problems:   * No active hospital problems. *   Consultations: Treatment Team:  Valetta Fuller, MD Dr. Gerrit Friends, CCS  Procedures Performed:  Dg Cholangiogram Operative  01/28/2013  *RADIOLOGY REPORT*  Intraoperative cholangiogram  History:  Gallbladder disease  Findings:  Gallbladder is been removed, and cystic duct has been cannulated.  The intrahepatic and extrahepatic ducts are normal in size and contour.  No mass or calculus is seen.  There is apparent free flow of contrast via the common bile duct into the duodenum.  Conclusion:  Study within normal limits.  No obstructing foci seen.   Original Report Authenticated By: Bretta Bang, M.D.    US Abdomen Complete  01/25/2013  *RADIOLOGY REPORT*  Clinical Data:  Right upper quadrant abdominal pain.  ABDOMINAL ULTRASOUND COMPLETE  Comparison:  CT on 01/24/2013  Findings:  Gallbladder:  A small amount of layering echogenic sludge is seen in the gallbladder, but no discrete gallstones are identified. There is no evidence of gallbladder wall thickening or pericholecystic fluid.  Common Bile Duct:  Within normal limits in caliber. Measures 6 mm in diameter.  Liver: No focal mass lesion identified.  Within normal limits in parenchymal echogenicity.  IVC:  Appears normal.  Pancreas:  No abnormality identified.  Spleen:  Within normal limits in size and echotexture.  Right kidney:  Normal in size and parenchymal echogenicity.  No evidence of mass or hydronephrosis.  Left kidney:  Normal in size and parenchymal echogenicity.  No evidence of mass or hydronephrosis.  Abdominal Aorta:  No aneurysm identified.   IMPRESSION:  1.  Small amount of gallbladder sludge noted, but no discrete gallstones or signs of acute cholecystitis. 2.  No evidence of biliary dilatation, hydronephrosis, or other significant abnormality.   Original Report Authenticated By: Myles Rosenthal, M.D.    US Transvaginal Non-ob  01/26/2013  *RADIOLOGY REPORT*  Clinical Data: 47 year old female with pelvic pain, nausea and pelvic distention. History of hysterectomy.  TRANSABDOMINAL AND TRANSVAGINAL ULTRASOUND OF PELVIS Technique:  Both transabdominal and transvaginal ultrasound examinations of the pelvis were performed. Transabdominal technique was performed for global imaging of the pelvis including uterus, ovaries, adnexal regions, and pelvic cul-de-sac.  It was necessary to proceed with endovaginal exam following the transabdominal exam to visualize the the ovaries and endometrium.  Comparison:  01/24/2013 CT  Findings:  Uterus: The uterus is not visualized compatible with hysterectomy.  Right ovary:  The right ovary is unremarkable measuring 2.3 x 1.9 x 2.4 cm.  Left ovary: The left ovary is unremarkable measuring 3 x 1.7 x 2.1 cm.  A 1.5 cm follicle is present.  Other findings: There is no evidence of adnexal mass or free fluid.  IMPRESSION: Status post hysterectomy, otherwise unremarkable exam. No evidence of pelvic mass or other significant abnormality.   Original Report Authenticated By: Harmon Pier, M.D.    US Pelvis Complete  01/26/2013  *RADIOLOGY REPORT*  Clinical Data: 47 year old female with pelvic pain, nausea and pelvic distention. History of hysterectomy.  TRANSABDOMINAL AND TRANSVAGINAL ULTRASOUND OF PELVIS Technique:  Both transabdominal and transvaginal ultrasound examinations of the pelvis were performed. Transabdominal technique was performed for global imaging of the pelvis including uterus, ovaries, adnexal regions, and  pelvic cul-de-sac.  It was necessary to proceed with endovaginal exam following the transabdominal exam to  visualize the the ovaries and endometrium.  Comparison:  01/24/2013 CT  Findings:  Uterus: The uterus is not visualized compatible with hysterectomy.  Right ovary:  The right ovary is unremarkable measuring 2.3 x 1.9 x 2.4 cm.  Left ovary: The left ovary is unremarkable measuring 3 x 1.7 x 2.1 cm.  A 1.5 cm follicle is present.  Other findings: There is no evidence of adnexal mass or free fluid.  IMPRESSION: Status post hysterectomy, otherwise unremarkable exam. No evidence of pelvic mass or other significant abnormality.   Original Report Authenticated By: Harmon Pier, M.D.    Ct Abdomen Pelvis W Contrast  01/24/2013  *RADIOLOGY REPORT*  Clinical Data: Acute severe lower abdominal pain for 5 days.  Prior hysterectomy.  CT ABDOMEN AND PELVIS WITH CONTRAST  Technique:  Multidetector CT imaging of the abdomen and pelvis was performed following the standard protocol during bolus administration of intravenous contrast.  Contrast: OMNIPAQUE IOHEXOL 300 MG/ML  SOLN, 50mL OMNIPAQUE IOHEXOL 300 MG/ML  SOLN  Comparison: None  Findings: Lung bases are unremarkable.  No focal abnormality identified within the liver, spleen, pancreas, adrenal glands, or kidneys.  Possible intrarenal calculus identified in the mid pole region of the right kidney measures 3 mm in diameter.  The stomach, small bowel loops have a normal appearance. The appendix is well seen and has a normal appearance.  Colonic loops are normal in appearance.  The uterus is absent.  Probable left ovarian follicle is 2.0 cm. No free pelvic fluid or adenopathy. No evidence for aortic aneurysm.  Visualized osseous structures have a normal appearance.  IMPRESSION:  1.  No evidence for acute abnormality of the abdomen or pelvis. 2.  Probable intrarenal calculus in the right kidney, 3 mm in diameter.  No urinary tract obstruction. 3.  Normal appendix. 4.  Probable left ovarian follicle, 2 cm in diameter.   Original Report Authenticated By: Norva Pavlov, M.D.     Nm Hepato W/eject Fract  01/27/2013  *RADIOLOGY REPORT*  Clinical Data:  , pain, nausea diarrhea.  NUCLEAR MEDICINE HEPATOBILIARY IMAGING WITH GALLBLADDER EF  Technique:  Sequential images of the abdomen were obtained out to 60 minutes following intravenous administration of radiopharmaceutical. After oral ingestion of 8 ounces of Half-and- Half cream, gallbladder ejection fraction was determined.  Radiopharmaceutical:  5.5 mCi Tc-103m Choletec  Comparison:  CT abdomen and pelvis 01/24/2013 and abdominal ultrasound 01/25/2013.  Findings: The liver demonstrates homogeneous, normal uptake.  The gallbladder is visualized within 10 minutes.  Radiotracer flows into the common bile duct and small bowel.  The patient did experience some cramping after oral ingestion of 8 ozs of Ensure cream. Gallbladder ejection fraction is calculated at 9.5% at 38 minutes.  IMPRESSION:  1.  Decreased gallbladder ejection fraction. The patient experienced cramping during calculation of gallbladder ejection fraction. 2.  Patent cystic duct.   Original Report Authenticated By: Holley Dexter, M.D.    GI Procedures: None  History/Physical Exam:  See Admission H&P  Admission HPI: Patient is a 47 year old female who was admitted to [facility] hospital directly from our office on 3/14 with severe abdominal pain.  Patient was admitted to GI service and CT scan of the abdomen and pelvis showed no acute abnormalities.  U/S showed small amount of gallbladder sludge was noted but no discrete signs of gallstones or acute cholecystitis.  Pelvis ultrasounds were unremarkable.  She was  seen by urology for her IC while she was here and for possible bladder hydro-distention, which was postponed for outpatient procedure when HIDA scan returned with reduced EF and surgery was consulted for cholecystectomy.  She eventually underwent cholecystectomy on 3/18.  Today she says that she feels better and is just having post-surgical pain.  She tolerated full  liquid diet for breakfast and was advanced to full diet for lunch, which she also tolerated well.  She was ambulating halls.  It was determined that she was ready for discharge on the afternoon on 3/19.  Labs remained relatively unremarkable throughout her stay.   Discharge Vitals:  BP 140/81  Pulse 73  Temp(Src) 99.3 F (37.4 C) (Oral)  Resp 18  Ht 5\' 4"  (1.626 m)  Wt 182 lb 1.6 oz (82.6 kg)  BMI 31.24 kg/m2  SpO2 97%  Discharge Labs: No results found for this or any previous visit (from the past 24 hour(s)).  Disposition and follow-up:   Ms.Samarrah S Hockenbury was discharged from [facility] in stable condition.    Follow-up Appointments:     Discharge Orders   Future Appointments Provider Department Dept Phone   02/11/2013 12:10 PM [facility] [phone]   Patient should wear two piece clothing and wear no powder or deodorant. Patient should arrive 15 minutes early.   Future Orders Complete By Expires     ABDOMINAL PROCEDURE/ANEURYSM REPAIR/AORTO-BIFEMORAL BYPASS:  Call MD for increased abdominal pain; cramping diarrhea; nausea/vomiting  As directed     Call MD for:  redness, tenderness, or signs of infection (pain, swelling, bleeding, redness, odor or green/yellow discharge around incision site)  As directed     Call MD for:  temperature >100.5  As directed     Driving Restrictions  As directed     Comments:      No driving for 3-4 days or as otherwise instructed by surgeons.    Increase activity slowly  As directed     Comments:      Walk with assistance use walker or cane as needed    Resume previous diet  As directed        Discharge Medications:   Medication List    STOP taking these medications       acetaminophen 500 MG tablet  Commonly known as:  TYLENOL     HYDROcodone-acetaminophen 7.5-500 MG per tablet  Commonly known as:  LORTAB     traMADol 50 MG tablet  Commonly known as:  ULTRAM      TAKE these  medications       clindamycin 2 % vaginal cream  Commonly known as:  CLEOCIN  Place 1 Applicatorful vaginally at bedtime.     cyclobenzaprine 10 MG tablet  Commonly known as:  FLEXERIL  Take 1 tablet (10 mg total) by mouth 3 (three) times daily as needed.     losartan 50 MG tablet  Commonly known as:  COZAAR  Take 1 tablet (50 mg total) by mouth 2 (two) times daily.     omeprazole 40 MG capsule  Commonly known as:  PRILOSEC  Take 40 mg by mouth daily as needed (acid reflux).     ONDANSETRON PO  Take 1 tablet by mouth as needed (Nausea and vomiting.).     PHENERGAN PO  Take by mouth as needed.        SignedCristi Loron, Karriem Muench D. 01/29/2013, 3:37 PM

## 2013-01-29 NOTE — Progress Notes (Signed)
Patient seen, examined, and I agree with the above documentation, including the assessment and plan. Overall "feel much better" Plan for discharge this afternoon the patient able to tolerate full diet at lunch.  Discontinue IV pain medication I recommend MiraLAX daily while on narcotics She will followup with Dr. Gerrit Friends for postoperative check; can follow with Dr. Russella Dar when necessary Also to see urology as an outpatient

## 2013-01-29 NOTE — Progress Notes (Signed)
Dr. Juanda Chance returned my call.  She is calling in an order for Phenergan to pt's pharmacy [facility], [address]  I called pt at home to let her know that the Rx is being called in.

## 2013-01-29 NOTE — Progress Notes (Signed)
[facility] Progress Note  Subjective:  Feels better.  Obviously still some pain, but says that it is a "different" pain; pain from surgery.  Some nausea, but eating a full liquid diet this AM and going to have regular diet for lunch.  Not passing flatus yet.  Objective:  Vital signs in last 24 hours: Temp:  [98.2 F (36.8 C)-99.9 F (37.7 C)] 99.4 F (37.4 C) (03/19 0529) Pulse Rate:  [55-102] 74 (03/19 0529) Resp:  [14-20] 18 (03/19 0529) BP: (81-164)/(49-90) 114/63 mmHg (03/19 0529) SpO2:  [93 %-100 %] 93 % (03/19 0529) Last BM Date: 01/27/13 General:   Alert, Well-developed, in NAD Heart:  Regular rate and rhythm; no murmurs Pulm:  CTAB.  No W/R/R. Abdomen:  Soft, nondistended. BS quiet.  Small surgical incisions noted.  Mild upper abdominal TTP. Extremities:  Without edema. Neurologic:  Alert and  oriented x4;  grossly normal neurologically. Psych:  Alert and cooperative. Normal mood and affect.  Intake/Output from previous day: 03/18 0701 - 03/19 0700 In: 900 [I.V.:900] Out: -   Dg Cholangiogram Operative  01/28/2013  *RADIOLOGY REPORT*  Intraoperative cholangiogram  History:  Gallbladder disease  Findings:  Gallbladder is been removed, and cystic duct has been cannulated.  The intrahepatic and extrahepatic ducts are normal in size and contour.  No mass or calculus is seen.  There is apparent free flow of contrast via the common bile duct into the duodenum.  Conclusion:  Study within normal limits.  No obstructing foci seen.   Original Report Authenticated By: Bretta Bang, M.D.    Nm Hepato W/eject Fract  01/27/2013  *RADIOLOGY REPORT*  Clinical Data:  , pain, nausea diarrhea.  NUCLEAR MEDICINE HEPATOBILIARY IMAGING WITH GALLBLADDER EF  Technique:  Sequential images of the abdomen were obtained out to 60 minutes following intravenous administration of radiopharmaceutical. After oral ingestion of 8 ounces of Half-and- Half cream, gallbladder ejection fraction  was determined.  Radiopharmaceutical:  5.5 mCi Tc-49m Choletec  Comparison:  CT abdomen and pelvis 01/24/2013 and abdominal ultrasound 01/25/2013.  Findings: The liver demonstrates homogeneous, normal uptake.  The gallbladder is visualized within 10 minutes.  Radiotracer flows into the common bile duct and small bowel.  The patient did experience some cramping after oral ingestion of 8 ozs of Ensure cream. Gallbladder ejection fraction is calculated at 9.5% at 38 minutes.  IMPRESSION:  1.  Decreased gallbladder ejection fraction. The patient experienced cramping during calculation of gallbladder ejection fraction. 2.  Patent cystic duct.   Original Report Authenticated By: Holley Dexter, M.D.     Assessment / Plan: -RUQ abdominal pain with nausea s/p lap chole 3/18; symptoms improved.  Possible D/C later today. -IC:  To follow-up with Dr. Isabel Caprice as an outpatient for bladder hydro-distention.   LOS: 5 days   Deanna George D.  01/29/2013, 8:43 AM  Pager number [phone]

## 2013-01-29 NOTE — Progress Notes (Signed)
Patient ID: Deanna George, female   DOB: Dec 16, 1965, 47 y.o.   MRN: [id number]  General Surgery - [facility] - Progress Note  POD# 1  Subjective: Patient feels "better".  Mild pain.  Mild nausea.  Ambulated.  Objective: Vital signs in last 24 hours: Temp:  [98.2 F (36.8 C)-99.9 F (37.7 C)] 99.4 F (37.4 C) (03/19 0529) Pulse Rate:  [55-102] 74 (03/19 0529) Resp:  [14-20] 18 (03/19 0529) BP: (81-164)/(49-90) 114/63 mmHg (03/19 0529) SpO2:  [93 %-100 %] 93 % (03/19 0529) Last BM Date: 01/27/13  Intake/Output from previous day: 03/18 0701 - 03/19 0700 In: 900 [I.V.:900] Out: -   Exam: HEENT - clear, not icteric Neck - soft Chest - clear bilaterally Cor - RRR, no murmur Abd - soft without distension; dressings dry and intact Ext - no significant edema Neuro - grossly intact, no focal deficits  Lab Results:  No results found for this basename: WBC, HGB, HCT, PLT,  in the last 72 hours  No results found for this basename: NA, K, CL, CO2, GLUCOSE, BUN, CREATININE, CALCIUM,  in the last 72 hours  Studies/Results: Dg Cholangiogram Operative  01/28/2013  *RADIOLOGY REPORT*  Intraoperative cholangiogram  History:  Gallbladder disease  Findings:  Gallbladder is been removed, and cystic duct has been cannulated.  The intrahepatic and extrahepatic ducts are normal in size and contour.  No mass or calculus is seen.  There is apparent free flow of contrast via the common bile duct into the duodenum.  Conclusion:  Study within normal limits.  No obstructing foci seen.   Original Report Authenticated By: Bretta Bang, M.D.    Nm Hepato W/eject Fract  01/27/2013  *RADIOLOGY REPORT*  Clinical Data:  , pain, nausea diarrhea.  NUCLEAR MEDICINE HEPATOBILIARY IMAGING WITH GALLBLADDER EF  Technique:  Sequential images of the abdomen were obtained out to 60 minutes following intravenous administration of radiopharmaceutical. After oral ingestion of 8 ounces of Half-and- Half  cream, gallbladder ejection fraction was determined.  Radiopharmaceutical:  5.5 mCi Tc-16m Choletec  Comparison:  CT abdomen and pelvis 01/24/2013 and abdominal ultrasound 01/25/2013.  Findings: The liver demonstrates homogeneous, normal uptake.  The gallbladder is visualized within 10 minutes.  Radiotracer flows into the common bile duct and small bowel.  The patient did experience some cramping after oral ingestion of 8 ozs of Ensure cream. Gallbladder ejection fraction is calculated at 9.5% at 38 minutes.  IMPRESSION:  1.  Decreased gallbladder ejection fraction. The patient experienced cramping during calculation of gallbladder ejection fraction. 2.  Patent cystic duct.   Original Report Authenticated By: Holley Dexter, M.D.     Assessment / Plan: 1.  Status post lap chole with IOC  Advance diet as tolerated  Encouraged ambulation  Possibly home later today if tolerates diet  Follow up at [facility] office 2-3 weeks for wound check  Per medical and GI service  Velora Heckler, MD, [facility] Office: [phone]  01/29/2013

## 2013-01-29 NOTE — Progress Notes (Signed)
Pt assessment unchanged from this morning; pt medically stable for DC per MD orders.  Review DC instructions and medications with pt. Pt DC'd ca. 1650.   Pt sent to [facility] to have Percocet and Oxycontin Rx filled b/c her pharmacy at [facility] unable to fill.  Pt states she does not have antiemetics at home.  This RN unable to reach Amy Esterwood or Doug Sou to request phone-in scripts; have called on-call MD Dr Juanda Chance on 2 occasions with same request but no response as of this note.

## 2013-02-03 ENCOUNTER — Other Ambulatory Visit (INDEPENDENT_AMBULATORY_CARE_PROVIDER_SITE_OTHER): Payer: BC Managed Care – PPO

## 2013-02-03 ENCOUNTER — Telehealth: Payer: Self-pay | Admitting: Physician Assistant

## 2013-02-03 DIAGNOSIS — R197 Diarrhea, unspecified: Secondary | ICD-10-CM

## 2013-02-03 LAB — CBC WITH DIFFERENTIAL/PLATELET
Basophils Absolute: 0 10*3/uL (ref 0.0–0.1)
Basophils Relative: 0.3 % (ref 0.0–3.0)
Eosinophils Absolute: 0.1 10*3/uL (ref 0.0–0.7)
Eosinophils Relative: 1.4 % (ref 0.0–5.0)
HCT: 38.2 % (ref 36.0–46.0)
Hemoglobin: 13.5 g/dL (ref 12.0–15.0)
Lymphocytes Relative: 11.6 % — ABNORMAL LOW (ref 12.0–46.0)
Lymphs Abs: 1 10*3/uL (ref 0.7–4.0)
MCHC: 35.5 g/dL (ref 30.0–36.0)
MCV: 86.4 fl (ref 78.0–100.0)
Monocytes Absolute: 0.5 10*3/uL (ref 0.1–1.0)
Monocytes Relative: 5.3 % (ref 3.0–12.0)
Neutro Abs: 7.3 10*3/uL (ref 1.4–7.7)
Neutrophils Relative %: 81.4 % — ABNORMAL HIGH (ref 43.0–77.0)
Platelets: 199 10*3/uL (ref 150.0–400.0)
RBC: 4.42 Mil/uL (ref 3.87–5.11)
RDW: 12.8 % (ref 11.5–14.6)
WBC: 8.9 10*3/uL (ref 4.5–10.5)

## 2013-02-03 LAB — BASIC METABOLIC PANEL
BUN: 6 mg/dL (ref 6–23)
CO2: 23 mEq/L (ref 19–32)
Calcium: 9.4 mg/dL (ref 8.4–10.5)
Chloride: 104 mEq/L (ref 96–112)
Creatinine, Ser: 0.8 mg/dL (ref 0.4–1.2)
GFR: 80.17 mL/min (ref >60.00)
Glucose, Bld: 113 mg/dL — ABNORMAL HIGH (ref 70–99)
Potassium: 3.6 mEq/L (ref 3.5–5.1)
Sodium: 136 mEq/L (ref 135–145)

## 2013-02-03 MED ORDER — CHOLESTYRAMINE 4 G PO PACK: PACK | ORAL | Status: DC

## 2013-02-03 NOTE — Telephone Encounter (Signed)
Spoke with patient and she states she is having massive diarrhea. States every time she eats, within 10-15 minutes she has diarrhea. Reports it smells funny. She also reports cramping and nausea. She is taking Promethazine for nausea without relief. Report temperature of 99. Spoke with Doug Sou, PA- ordered Stool for C.diff pcr, C&S, lactoferrin, CBC with diff, BMet. Questran one packet daily for diarrhea. Labs in [place], Rx sent. Patient aware.

## 2013-02-04 ENCOUNTER — Other Ambulatory Visit: Payer: BC Managed Care – PPO

## 2013-02-04 DIAGNOSIS — R197 Diarrhea, unspecified: Secondary | ICD-10-CM

## 2013-02-05 ENCOUNTER — Telehealth: Payer: Self-pay | Admitting: *Deleted

## 2013-02-05 LAB — CLOSTRIDIUM DIFFICILE BY PCR: Toxigenic C. Difficile by PCR: DETECTED — CR

## 2013-02-05 LAB — FECAL LACTOFERRIN, QUANT: Lactoferrin: NEGATIVE

## 2013-02-05 MED ORDER — METRONIDAZOLE 500 MG PO TABS: ORAL_TABLET | ORAL | Status: DC

## 2013-02-05 NOTE — Telephone Encounter (Signed)
Patient notified of c.diff results. Rx sent to pharmacy for Flagyl 500 mg po TID x 10 days as per Doug Sou, PA.

## 2013-02-07 ENCOUNTER — Other Ambulatory Visit: Payer: Self-pay | Admitting: Cardiology

## 2013-02-07 ENCOUNTER — Telehealth: Payer: Self-pay | Admitting: *Deleted

## 2013-02-07 MED ORDER — LOSARTAN POTASSIUM 50 MG PO TABS: 50.0000 mg | ORAL_TABLET | Freq: Two times a day (BID) | ORAL | Status: DC

## 2013-02-07 NOTE — Telephone Encounter (Signed)
Pt called to follow up with you regarding her pelvic pain. Pt had gallbladder removed and tested positive for C.DIff. All information in Epic system to review. She asked me relay information

## 2013-02-08 LAB — STOOL CULTURE

## 2013-02-10 ENCOUNTER — Telehealth: Payer: Self-pay | Admitting: Gastroenterology

## 2013-02-10 ENCOUNTER — Telehealth: Payer: Self-pay | Admitting: *Deleted

## 2013-02-10 NOTE — Telephone Encounter (Signed)
Pt would like to know if she would still need to come back for annual exam? She was seen on 01/23/13 for pelvic pain. I told pt I would thinks so, but 100 % sure and I would check with you. Please advise

## 2013-02-10 NOTE — Telephone Encounter (Signed)
Patient does need to come in for her annual exam. I would recommend waiting until she is released by the general surgeons from her surgery. Maybe one to 2 months from now for her annual.

## 2013-02-10 NOTE — Telephone Encounter (Signed)
Left the below on pt voicemail. 

## 2013-02-10 NOTE — Telephone Encounter (Signed)
On call note @ 2000. Pt recently diagnosed with C diff and has been on Flagyl for 4-5 days. She notes persistent diarrhea and weakness. She has improved but today had more watery diarrhea than prior 2 days. No fevers, chills. Notes abd cramping with diarrhea but no pain.  Clear liquids only tonight and tomorrow morning. Call office to report progress in the morning. She may need an office visit tomorrow or to change to Vancomycin. If she is doing better tomorrow she should gradually advance to a low residue, lactose free, caffeine free diet.

## 2013-02-10 NOTE — Telephone Encounter (Signed)
All questions answered about c-diff.  She will call back for any additional questions or concerns

## 2013-02-11 ENCOUNTER — Ambulatory Visit: Payer: BC Managed Care – PPO

## 2013-02-11 ENCOUNTER — Telehealth: Payer: Self-pay | Admitting: Gastroenterology

## 2013-02-11 ENCOUNTER — Telehealth: Payer: Self-pay

## 2013-02-11 MED ORDER — ONDANSETRON HCL 8 MG PO TABS: 8.0000 mg | ORAL_TABLET | Freq: Three times a day (TID) | ORAL | Status: DC | PRN

## 2013-02-11 MED ORDER — VANCOMYCIN HCL 125 MG PO CAPS: 125.0000 mg | ORAL_CAPSULE | Freq: Four times a day (QID) | ORAL | Status: DC

## 2013-02-11 NOTE — Telephone Encounter (Signed)
Pt did not want to come in today due to the copay. New script sent to the pharmacy and pt is aware.

## 2013-02-11 NOTE — Telephone Encounter (Signed)
Pt aware and script ordered.  Pt aware.

## 2013-02-11 NOTE — Telephone Encounter (Signed)
Spoke with pt and she states she does not have a fever but she just feels awful. States everything aches. Offered for pt to be seen today but she really does not know what good coming in would do, states she cannot afford another copay. Pt would like another antibiotic called in for her to see if that helps. Dr. Russella Dar please advise.

## 2013-02-11 NOTE — Telephone Encounter (Signed)
Deanna Dare, MD at 02/10/2013 9:19 PM   Status: Signed            On call note @ 2000. Pt recently diagnosed with C diff and has been on Flagyl for 4-5 days. She notes persistent diarrhea and weakness. She has improved but today had more watery diarrhea than prior 2 days. No fevers, chills. Notes abd cramping with diarrhea but no pain.  Clear liquids only tonight and tomorrow morning. Call office to report progress in the morning. She may need an office visit tomorrow or to change to Vancomycin. If she is doing better tomorrow she should gradually advance to a low residue, lactose free, caffeine free diet.

## 2013-02-11 NOTE — Telephone Encounter (Signed)
Zofran 8 mg po tid prn, #30, 1 refill If she continues to feel poorly she may need an office visit later this week

## 2013-02-11 NOTE — Telephone Encounter (Signed)
Pt called back and states she is also having problems with nausea. She is going to pick up the vancomycin but she also wants to know if she can have something to help with the nausea. Please advise.

## 2013-02-11 NOTE — Telephone Encounter (Signed)
I recommend that she sees one of our extenders today to adequately assess her. If she refuses we can DC metronidazole and start vancomycin 125 mg po qid for 14 days. If she is still taking [place] she can use it bid prn for diarrhea but it must be taken hours apart from vanco or any other med.

## 2013-02-13 ENCOUNTER — Ambulatory Visit: Payer: BC Managed Care – PPO | Admitting: Gynecology

## 2013-02-17 ENCOUNTER — Telehealth: Payer: Self-pay | Admitting: Gastroenterology

## 2013-02-17 NOTE — Telephone Encounter (Signed)
Patient's questions about c-diff answered.  She wants to come and leave a stool specimen and be tested again for c-diff.  I explained to her that if she is not having liquid stools and is still on antibiotics she needs to just continue to take antibiotics.  She needs to call back if her symptoms return once her antibiotics are complete

## 2013-02-24 ENCOUNTER — Ambulatory Visit (INDEPENDENT_AMBULATORY_CARE_PROVIDER_SITE_OTHER): Payer: BC Managed Care – PPO | Admitting: Nurse Practitioner

## 2013-02-24 ENCOUNTER — Encounter: Payer: Self-pay | Admitting: Nurse Practitioner

## 2013-02-24 ENCOUNTER — Other Ambulatory Visit: Payer: Self-pay | Admitting: *Deleted

## 2013-02-24 VITALS — BP 130/80 | HR 88 | Ht 64.5 in | Wt 177.8 lb

## 2013-02-24 DIAGNOSIS — I10 Essential (primary) hypertension: Secondary | ICD-10-CM

## 2013-02-24 NOTE — Patient Instructions (Addendum)
I think you are doing well  Stay on your current medicines  Stay active  I will see you in a year  Call the [facility] office at [phone] if you have any questions, problems or concerns.

## 2013-02-24 NOTE — Progress Notes (Signed)
Deanna George Date of Birth: November 26, 1965 Medical Record #[id number]  History of Present Illness: Deanna George is seen back today for a follow up visit. She is seen for Dr. Antoine Poche. She is a former patient of Dr. Ronnald Nian. Has HTN with mild LVH on echo. Other issues are as noted.   Seen last October of 2012. Put back on her medicines.   Comes back today. She is here alone. Doing well. Has been pretty sick over the last month with her gallbladder and a CDiff infection. On her second round of Vancomycin. Doing better now. No chest pain. BP at home has been good. Tolerating her medicine. Has no cardiac complaints.   Current Outpatient Prescriptions on File Prior to Visit  Medication Sig Dispense Refill  . cyclobenzaprine (FLEXERIL) 10 MG tablet Take 1 tablet (10 mg total) by mouth 3 (three) times daily as needed.  30 tablet  0  . losartan (COZAAR) 50 MG tablet Take 1 tablet (50 mg total) by mouth 2 (two) times daily.  180 tablet  0  . omeprazole (PRILOSEC) 40 MG capsule Take 40 mg by mouth daily as needed (acid reflux).      . ondansetron (ZOFRAN) 8 MG tablet Take 1 tablet (8 mg total) by mouth every 8 (eight) hours as needed for nausea.  30 tablet  0  . vancomycin (VANCOCIN) 125 MG capsule Take 1 capsule (125 mg total) by mouth 4 (four) times daily.  56 capsule  0   No current facility-administered medications on file prior to visit.    No Known Allergies  Past Medical History  Diagnosis Date  . Hypertension   . Interstitial cystitis   . Pneumonia   . Urinary tract infection   . GERD (gastroesophageal reflux disease)   . Esophageal stricture   . IBS (irritable bowel syndrome)   . Hemorrhoids   . Anal fissure   . Palpitations   . OA (osteoarthritis) of knee   . Cervical disc disease   . Noncompliance   . Situational stress     Past Surgical History  Procedure Laterality Date  . Laparoscopic vaginal hysterectomy      02/2008  . Laparoscopic surgery  01/2007    uterus and ovary x 2    . Cesarean section  [id number]    x2  . Foot pin post fracture  09/2008    Left foot  . Colonoscopy    . Knee arthroscopy  2011     right  . US echocardiography  07/31/2008    EF 55-60% with mild LVH  . Cholecystectomy N/A 01/28/2013    Procedure: LAPAROSCOPIC CHOLECYSTECTOMY WITH INTRAOPERATIVE CHOLANGIOGRAM;  Surgeon: Velora Heckler, MD;  Location: [facility];  Service: General;  Laterality: N/A;    History  Smoking status  . Never Smoker   Smokeless tobacco  . Never Used    History  Alcohol Use No    Family History  Problem Relation Age of Onset  . Adopted: Yes    Review of Systems: The review of systems is per the HPI.  All other systems were reviewed and are negative.  Physical Exam: BP 130/80  Pulse 88  Ht 5' 4.5" (1.638 m)  Wt 177 lb 12.8 oz (80.65 kg)  BMI 30.06 kg/m2 Patient is very pleasant and in no acute distress. Skin is warm and dry. Color is normal.  HEENT is unremarkable. Normocephalic/atraumatic. PERRL. Sclera are nonicteric. Neck is supple. No masses. No JVD. Lungs are clear. Cardiac exam shows  a regular rate and rhythm. Abdomen is soft. Extremities are without edema. Gait and ROM are intact. No gross neurologic deficits noted.  LABORATORY DATA:  Lab Results  Component Value Date   WBC 8.9 02/03/2013   HGB 13.5 02/03/2013   HCT 38.2 02/03/2013   PLT 199.0 02/03/2013   GLUCOSE 113* 02/03/2013   CHOL 170 09/05/2011   TRIG 104.0 09/05/2011   HDL 46.90 09/05/2011   LDLCALC 102* 09/05/2011   ALT 24 01/26/2013   AST 24 01/26/2013   NA 136 02/03/2013   K 3.6 02/03/2013   CL 104 02/03/2013   CREATININE 0.8 02/03/2013   BUN 6 02/03/2013   CO2 23 02/03/2013   TSH 0.38 09/05/2011   INR 1.01 01/24/2013     Assessment / Plan: 1. HTN - BP looks good. I have refilled her medicine. See her back in a year. Continue with CV risk factor modification. She has had extensive labs recently. I will not repeat this today.  2. Recent Cdiff infection - on second round of  Vancomycin.  Patient is agreeable to this plan and will call if any problems develop in the interim.   Rosalio Macadamia, RN, ANP-C [facility] [address] [place], [place]  [address]

## 2013-02-28 ENCOUNTER — Telehealth: Payer: Self-pay | Admitting: Gastroenterology

## 2013-02-28 NOTE — Telephone Encounter (Signed)
Discontinued vancomycin for pseudomembranous colitis 2 days ago. She has developed some mild abdominal disquietude and had very mild poorly formed stools x3 today. She's having some queasiness. She is concerned that she made be developing another infection.  The patient is not overtly ill but more concerned than anything else. Advised her to continue observation for another 12-24 hours. If she is clearly worsening and developing symptoms consistent with her prior Pseudomonas colitis  I will restart vancomycin. If she does not progress with her symptoms then she will continue to observe.

## 2013-03-01 ENCOUNTER — Telehealth: Payer: Self-pay | Admitting: Gastroenterology

## 2013-03-01 MED ORDER — VANCOMYCIN HCL 125 MG PO CAPS: 125.0000 mg | ORAL_CAPSULE | Freq: Four times a day (QID) | ORAL | Status: DC

## 2013-03-01 NOTE — Telephone Encounter (Signed)
C/o worsening symptoms of nausea, abdominal discomfort, multiple loose stools. 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  Plan restarting vancomycin

## 2013-03-11 ENCOUNTER — Ambulatory Visit: Payer: BC Managed Care – PPO

## 2013-03-18 ENCOUNTER — Ambulatory Visit: Payer: BC Managed Care – PPO

## 2013-08-12 ENCOUNTER — Ambulatory Visit (INDEPENDENT_AMBULATORY_CARE_PROVIDER_SITE_OTHER): Payer: Managed Care, Other (non HMO) | Admitting: Gynecology

## 2013-08-12 ENCOUNTER — Encounter: Payer: Self-pay | Admitting: Gynecology

## 2013-08-12 DIAGNOSIS — N764 Abscess of vulva: Secondary | ICD-10-CM

## 2013-08-12 DIAGNOSIS — Z23 Encounter for immunization: Secondary | ICD-10-CM

## 2013-08-12 NOTE — Progress Notes (Signed)
Patient ID: Deanna George, female   DOB: Jul 05, 1966, 47 y.o.   MRN: [id number] Patient presents with a several day history of worsening cyst on her vulva. Becoming more painful. No history of similar before.  Exam was Administrator, [employer] vagina with cystic feeling 1 cm mass upper outer left labia minora at the junction of the clitoral hood. No overt cellulitis or inguinal adenopathy. Vagina grossly normal. Physical Exam  Genitourinary:        Procedure: Skin overlying area was cleansed with Betadine, infiltrated with 1% lidocaine and a small stab incision was made with return of pus. The boil was emptied. Hemostasis spontaneously achieved with pressure.  Assessment and plan: Small boil upper outer left labia minora. I & D performed. Recommend sitz baths tonight and followup if any persistence or recurrence.

## 2013-08-12 NOTE — Addendum Note (Signed)
Addended by: Dayna Barker on: 08/12/2013 04:02 PM   Modules accepted: Orders

## 2013-08-12 NOTE — Patient Instructions (Signed)
Recommend warm sitz baths. Followup if the labial boil returns or any other issues.

## 2013-09-19 ENCOUNTER — Encounter: Payer: Managed Care, Other (non HMO) | Admitting: Gynecology

## 2013-09-24 ENCOUNTER — Encounter: Payer: Self-pay | Admitting: Nurse Practitioner

## 2013-10-16 ENCOUNTER — Telehealth: Payer: Self-pay

## 2013-10-16 ENCOUNTER — Other Ambulatory Visit: Payer: Self-pay

## 2013-10-16 MED ORDER — LOSARTAN POTASSIUM 50 MG PO TABS: 50.0000 mg | ORAL_TABLET | Freq: Two times a day (BID) | ORAL | Status: DC

## 2013-10-16 NOTE — Telephone Encounter (Signed)
PATIENT CALLED NEED A REFILL ON HER LOSARTAN AND TO UPDATE HER PHARMACY AND HER INSURANCE. I COULD NOT FIND THE PHARMACY IN THE SYSTEM SO I CALLED THE RX IN AND I HAD HIRAA TO CHANGE THE INSURANCE. PHARMACY IS [facility] ON [address] IN [place] [facility]ORE

## 2013-12-17 ENCOUNTER — Other Ambulatory Visit: Payer: Self-pay | Admitting: *Deleted

## 2013-12-17 MED ORDER — LOSARTAN POTASSIUM 50 MG PO TABS: 50.0000 mg | ORAL_TABLET | Freq: Two times a day (BID) | ORAL | Status: DC

## 2013-12-23 ENCOUNTER — Telehealth: Payer: Self-pay | Admitting: Gastroenterology

## 2013-12-23 MED ORDER — OMEPRAZOLE 40 MG PO CPDR: 40.0000 mg | DELAYED_RELEASE_CAPSULE | Freq: Every day | ORAL | Status: DC | PRN

## 2013-12-23 NOTE — Telephone Encounter (Signed)
Prescription sent to patient's mail order pharmacy. Patient notified prescription was sent and also that she is due for an office visit in the next couple of months. Patient states she will call back to schedule an appt.

## 2014-02-19 ENCOUNTER — Ambulatory Visit (INDEPENDENT_AMBULATORY_CARE_PROVIDER_SITE_OTHER): Payer: Managed Care, Other (non HMO) | Admitting: Women's Health

## 2014-02-19 ENCOUNTER — Encounter: Payer: Self-pay | Admitting: Women's Health

## 2014-02-19 DIAGNOSIS — R350 Frequency of micturition: Secondary | ICD-10-CM

## 2014-02-19 DIAGNOSIS — N898 Other specified noninflammatory disorders of vagina: Secondary | ICD-10-CM

## 2014-02-19 LAB — WET PREP FOR TRICH, YEAST, CLUE
Clue Cells Wet Prep HPF POC: NONE SEEN
Trich, Wet Prep: NONE SEEN
Yeast Wet Prep HPF POC: NONE SEEN

## 2014-02-19 LAB — URINALYSIS W MICROSCOPIC + REFLEX CULTURE
Bilirubin Urine: NEGATIVE
Glucose, UA: NEGATIVE mg/dL
Hgb urine dipstick: NEGATIVE
Ketones, ur: NEGATIVE mg/dL
Leukocytes, UA: NEGATIVE
Nitrite: NEGATIVE
Protein, ur: NEGATIVE mg/dL
Specific Gravity, Urine: 1.02 (ref 1.005–1.030)
Urobilinogen, UA: 0.2 mg/dL (ref 0.0–1.0)
pH: 5.5 (ref 5.0–8.0)

## 2014-02-19 MED ORDER — TERCONAZOLE 0.4 % VA CREA: 1.0000 | TOPICAL_CREAM | Freq: Every day | VAGINAL | Status: DC

## 2014-02-19 NOTE — Progress Notes (Signed)
Patient ID: Deanna George, female   DOB: 1966/11/03, 48 y.o.   MRN: [id number] Presents with complaint of vaginal itching and irritation, has used over-the-counter generic Monistat last week with some relief but irritation persists.  Also states has had some increased bladder discomfort questionable IC. Denies vaginal odor, abdominal pain or fever. TVH 2009. Not sexually active.  Exam: Appears well. External genitalia within normal limits, slight erythema at introitus. Speculum exam scant white discharge no erythema noted wet prep negative. Bimanual no adnexal fullness or tenderness. UA: Negative  Vaginal irritation with slight itching.  Plan: Terazol 7 prescription, will use externally as needed. Instructed to call if symptoms do not resolve. Yeast prevention discussed.

## 2014-02-23 ENCOUNTER — Telehealth: Payer: Self-pay | Admitting: *Deleted

## 2014-02-23 DIAGNOSIS — N898 Other specified noninflammatory disorders of vagina: Secondary | ICD-10-CM

## 2014-02-23 MED ORDER — TERCONAZOLE 0.4 % VA CREA: 1.0000 | TOPICAL_CREAM | Freq: Every day | VAGINAL | Status: DC

## 2014-02-23 NOTE — Telephone Encounter (Signed)
Pt terazol rx was sent to incorrect pharmacy

## 2014-03-02 ENCOUNTER — Telehealth: Payer: Self-pay | Admitting: *Deleted

## 2014-03-02 DIAGNOSIS — N898 Other specified noninflammatory disorders of vagina: Secondary | ICD-10-CM

## 2014-03-02 NOTE — Telephone Encounter (Signed)
Pt was prescribed Terazol 7 days cream on 02/19/14 pt still c/o itching and irritation, requesting refill on medication. Pt doesn't not want diflucan due to GI issues. Please advise

## 2014-03-03 ENCOUNTER — Encounter: Payer: Self-pay | Admitting: Nurse Practitioner

## 2014-03-03 ENCOUNTER — Ambulatory Visit (INDEPENDENT_AMBULATORY_CARE_PROVIDER_SITE_OTHER): Payer: Managed Care, Other (non HMO) | Admitting: Nurse Practitioner

## 2014-03-03 ENCOUNTER — Other Ambulatory Visit: Payer: Self-pay | Admitting: *Deleted

## 2014-03-03 VITALS — BP 130/90 | HR 94 | Ht 64.0 in | Wt 175.8 lb

## 2014-03-03 DIAGNOSIS — I10 Essential (primary) hypertension: Secondary | ICD-10-CM

## 2014-03-03 MED ORDER — TERCONAZOLE 0.4 % VA CREA: 1.0000 | TOPICAL_CREAM | Freq: Every day | VAGINAL | Status: DC

## 2014-03-03 MED ORDER — LOSARTAN POTASSIUM 50 MG PO TABS: 50.0000 mg | ORAL_TABLET | Freq: Two times a day (BID) | ORAL | Status: DC

## 2014-03-03 MED ORDER — LOSARTAN POTASSIUM 50 MG PO TABS
50.0000 mg | ORAL_TABLET | Freq: Two times a day (BID) | ORAL | Status: DC
Start: 2014-03-03 — End: 2015-09-15

## 2014-03-03 NOTE — Telephone Encounter (Signed)
Left on voicemail this has been done., rx sent

## 2014-03-03 NOTE — Patient Instructions (Signed)
Stay on your current medicines  I will see you in a year  Call the [facility] office at [phone] if you have any questions, problems or concerns.

## 2014-03-03 NOTE — Progress Notes (Signed)
Deanna George Date of Birth: Mar 18, 1966 Medical Record #[id number]  History of Present Illness: Deanna George is seen back today for a one year check. Seen for Dr. Percival Spanish. She was previously seen by Dr. Doreatha Lew. She has HTN with mild LVH on echo. Other issues as noted below.  Last seen a year ago - was doing well.   Comes back today. Here alone. Doing ok. Slowly losing weight. Notes that she eats better since her gallbladder was taken out. BP has been good. Some stress still. Rare palpitations. Had labs checked with PCP back in the fall.  Current Outpatient Prescriptions  Medication Sig Dispense Refill  . cyclobenzaprine (FLEXERIL) 10 MG tablet Take 1 tablet (10 mg total) by mouth 3 (three) times daily as needed.  30 tablet  0  . HYDROcodone-acetaminophen (NORCO/VICODIN) 5-325 MG per tablet Take 1 tablet by mouth every 6 (six) hours as needed for moderate pain.      Marland Kitchen losartan (COZAAR) 50 MG tablet Take 1 tablet (50 mg total) by mouth 2 (two) times daily.  120 tablet  0  . omeprazole (PRILOSEC) 40 MG capsule Take 1 capsule (40 mg total) by mouth daily as needed (acid reflux).  90 capsule  0  . ondansetron (ZOFRAN) 8 MG tablet Take 1 tablet (8 mg total) by mouth every 8 (eight) hours as needed for nausea.  30 tablet  0  . SUMAtriptan (IMITREX) 25 MG tablet Take 25 mg by mouth every 2 (two) hours as needed for migraine or headache. May repeat in 2 hours if headache persists or recurs.      Marland Kitchen terconazole (TERAZOL 7) 0.4 % vaginal cream Place 1 applicator vaginally at bedtime.  45 g  0  . traMADol (ULTRAM) 50 MG tablet Take 50 mg by mouth every 6 (six) hours as needed.       No current facility-administered medications for this visit.    No Known Allergies  Past Medical History  Diagnosis Date  . Hypertension   . Interstitial cystitis   . Pneumonia   . Urinary tract infection   . GERD (gastroesophageal reflux disease)   . Esophageal stricture   . IBS (irritable bowel syndrome)   .  Hemorrhoids   . Anal fissure   . Palpitations   . OA (osteoarthritis) of knee   . Cervical disc disease   . Noncompliance   . Situational stress     Past Surgical History  Procedure Laterality Date  . Laparoscopic vaginal hysterectomy      02/2008  . Laparoscopic surgery  01/2007    uterus and ovary x 2  . Cesarean section  [id number]    x2  . Foot pin post fracture  09/2008    Left foot  . Colonoscopy    . Knee arthroscopy  2011     right  . US echocardiography  07/31/2008    EF 55-60% with mild LVH  . Cholecystectomy N/A 01/28/2013    Procedure: LAPAROSCOPIC CHOLECYSTECTOMY WITH INTRAOPERATIVE CHOLANGIOGRAM;  Surgeon: Earnstine Regal, MD;  Location: [facility];  Service: General;  Laterality: N/A;    History  Smoking status  . Never Smoker   Smokeless tobacco  . Never Used    History  Alcohol Use No    Family History  Problem Relation Age of Onset  . Adopted: Yes    Review of Systems: The review of systems is per the HPI.  All other systems were reviewed and are negative.  Physical Exam: BP  130/90  Pulse 94  Ht 5\' 4"  (1.626 m)  Wt 175 lb 12.8 oz (79.742 kg)  BMI 30.16 kg/m2  SpO2 100% Patient is very pleasant and in no acute distress. Weight is going down. Skin is warm and dry. Color is normal.  HEENT is unremarkable. Normocephalic/atraumatic. PERRL. Sclera are nonicteric. Neck is supple. No masses. No JVD. Lungs are clear. Cardiac exam shows a regular rate and rhythm. Abdomen is soft. Extremities are without edema. Gait and ROM are intact. No gross neurologic deficits noted.  Wt Readings from Last 3 Encounters:  03/03/14 175 lb 12.8 oz (79.742 kg)  02/24/13 177 lb 12.8 oz (80.65 kg)  01/24/13 182 lb 1.6 oz (82.6 kg)     LABORATORY DATA:  Lab Results  Component Value Date   WBC 8.9 02/03/2013   HGB 13.5 02/03/2013   HCT 38.2 02/03/2013   PLT 199.0 02/03/2013   GLUCOSE 113* 02/03/2013   CHOL 170 09/05/2011   TRIG 104.0 09/05/2011   HDL 46.90 09/05/2011    LDLCALC 102* 09/05/2011   ALT 24 01/26/2013   AST 24 01/26/2013   NA 136 02/03/2013   K 3.6 02/03/2013   CL 104 02/03/2013   CREATININE 0.8 02/03/2013   BUN 6 02/03/2013   CO2 23 02/03/2013   TSH 0.38 09/05/2011   INR 1.01 01/24/2013   Echo Study Conclusions from 2012  - Left ventricle: The cavity size was normal. Wall thickness was increased in a pattern of mild LVH. Systolic function was normal. The estimated ejection fraction was in the range of 55% to 65%. Wall motion was normal; there were no regional wall motion abnormalities. Left ventricular diastolic function parameters were normal. - Mitral valve: Mild regurgitation. - Atrial septum: No defect or patent foramen ovale was identified.    Assessment / Plan: 1. HTN - recheck by me looks good. She has good outpatient control. Losartan refilled today. See again in one year. Had complete labs drawn with PCP back in the fall.  2. Palpitations - pretty quiescent  See back in a year.   Patient is agreeable to this plan and will call if any problems develop in the interim.   Burtis Junes, RN, [facility] [address] [place], [place]  [id number] [phone]

## 2014-03-03 NOTE — Telephone Encounter (Signed)
Ok please refill Terazol 7, hope that helps.

## 2014-03-16 ENCOUNTER — Other Ambulatory Visit: Payer: Self-pay | Admitting: *Deleted

## 2014-03-16 ENCOUNTER — Ambulatory Visit
Admission: RE | Admit: 2014-03-16 | Discharge: 2014-03-16 | Disposition: A | Discharge status: Home / Self Care | Payer: Managed Care, Other (non HMO) | Source: Ambulatory Visit | Attending: *Deleted | Admitting: *Deleted

## 2014-03-16 DIAGNOSIS — M436 Torticollis: Secondary | ICD-10-CM

## 2014-03-16 DIAGNOSIS — M542 Cervicalgia: Secondary | ICD-10-CM

## 2014-03-26 ENCOUNTER — Encounter: Payer: Managed Care, Other (non HMO) | Admitting: Gynecology

## 2014-05-08 ENCOUNTER — Encounter: Payer: Managed Care, Other (non HMO) | Admitting: Gynecology

## 2014-06-18 ENCOUNTER — Encounter: Payer: Managed Care, Other (non HMO) | Admitting: Gynecology

## 2014-07-07 ENCOUNTER — Encounter: Payer: Managed Care, Other (non HMO) | Admitting: Gynecology

## 2014-07-14 ENCOUNTER — Telehealth: Payer: Self-pay | Admitting: Gastroenterology

## 2014-07-14 DIAGNOSIS — R197 Diarrhea, unspecified: Secondary | ICD-10-CM

## 2014-07-14 NOTE — Telephone Encounter (Signed)
OK for GI pathogen panel before appt. Don't know if results will be back by then.

## 2014-07-14 NOTE — Telephone Encounter (Signed)
Patient is scheduled to see Dr. Fuller Plan this Thursday.  She would like to do stool studies prior to the appt.  She has intermittent diarrhea.  She states that it is not daily, but reports a history of c-diff last year and her symptoms were similar.  Dr. Fuller Plan would you like to do stool studies prior to her appt with you on Thursday?

## 2014-07-15 NOTE — Telephone Encounter (Signed)
Patient notified

## 2014-07-16 ENCOUNTER — Other Ambulatory Visit (INDEPENDENT_AMBULATORY_CARE_PROVIDER_SITE_OTHER): Payer: Managed Care, Other (non HMO)

## 2014-07-16 ENCOUNTER — Encounter: Payer: Self-pay | Admitting: Gastroenterology

## 2014-07-16 ENCOUNTER — Ambulatory Visit (INDEPENDENT_AMBULATORY_CARE_PROVIDER_SITE_OTHER): Payer: Managed Care, Other (non HMO) | Admitting: Gastroenterology

## 2014-07-16 VITALS — BP 128/80 | HR 88 | Ht 63.5 in | Wt 176.4 lb

## 2014-07-16 DIAGNOSIS — R110 Nausea: Secondary | ICD-10-CM

## 2014-07-16 DIAGNOSIS — R109 Unspecified abdominal pain: Secondary | ICD-10-CM

## 2014-07-16 DIAGNOSIS — F411 Generalized anxiety disorder: Secondary | ICD-10-CM

## 2014-07-16 LAB — CBC WITH DIFFERENTIAL/PLATELET
Basophils Absolute: 0.1 10*3/uL (ref 0.0–0.1)
Basophils Relative: 0.7 % (ref 0.0–3.0)
Eosinophils Absolute: 0.1 10*3/uL (ref 0.0–0.7)
Eosinophils Relative: 0.8 % (ref 0.0–5.0)
HCT: 38.2 % (ref 36.0–46.0)
Hemoglobin: 12.8 g/dL (ref 12.0–15.0)
Lymphocytes Relative: 19.8 % (ref 12.0–46.0)
Lymphs Abs: 1.6 10*3/uL (ref 0.7–4.0)
MCHC: 33.6 g/dL (ref 30.0–36.0)
MCV: 89.2 fl (ref 78.0–100.0)
Monocytes Absolute: 0.4 10*3/uL (ref 0.1–1.0)
Monocytes Relative: 5.4 % (ref 3.0–12.0)
Neutro Abs: 6.1 10*3/uL (ref 1.4–7.7)
Neutrophils Relative %: 73.3 % (ref 43.0–77.0)
Platelets: 200 10*3/uL (ref 150.0–400.0)
RBC: 4.28 Mil/uL (ref 3.87–5.11)
RDW: 12.6 % (ref 11.5–15.5)
WBC: 8.3 10*3/uL (ref 4.0–10.5)

## 2014-07-16 LAB — BASIC METABOLIC PANEL
BUN: 7 mg/dL (ref 6–23)
CO2: 20 mEq/L (ref 19–32)
Calcium: 9.3 mg/dL (ref 8.4–10.5)
Chloride: 108 mEq/L (ref 96–112)
Creatinine, Ser: 1 mg/dL (ref 0.4–1.2)
GFR: 67.21 mL/min (ref >60.00)
Glucose, Bld: 107 mg/dL — ABNORMAL HIGH (ref 70–99)
Potassium: 3.6 mEq/L (ref 3.5–5.1)
Sodium: 137 mEq/L (ref 135–145)

## 2014-07-16 LAB — HEPATIC FUNCTION PANEL
ALT: 9 U/L (ref 0–35)
AST: 18 U/L (ref 0–37)
Albumin: 4.1 g/dL (ref 3.5–5.2)
Alkaline Phosphatase: 57 U/L (ref 39–117)
Bilirubin, Direct: 0.1 mg/dL (ref 0.0–0.3)
Total Bilirubin: 0.6 mg/dL (ref 0.2–1.2)
Total Protein: 7.2 g/dL (ref 6.0–8.3)

## 2014-07-16 LAB — TSH: TSH: 0.58 u[IU]/mL (ref 0.35–4.50)

## 2014-07-16 MED ORDER — OMEPRAZOLE 40 MG PO CPDR: 40.0000 mg | DELAYED_RELEASE_CAPSULE | Freq: Two times a day (BID) | ORAL | Status: DC

## 2014-07-16 MED ORDER — ONDANSETRON 8 MG PO TBDP: 8.0000 mg | ORAL_TABLET | Freq: Three times a day (TID) | ORAL | Status: DC | PRN

## 2014-07-16 NOTE — Patient Instructions (Addendum)
Your physician has requested that you go to the basement for the following lab work before leaving today:[facility] panel.  Complete stool studies that you have at home and return them to the lab.   We have sent the following medications to your pharmacy for you to pick up at your convenience: Omeprazole and Zofran.  Please call back in 2 weeks with an update on your symptoms.   cc: [name], MD

## 2014-07-16 NOTE — Progress Notes (Signed)
    History of Present Illness: This is a 48 year old female who has noted mild upper abdominal pain and reflux symptoms for the past 2 weeks. She states it is difficult for her to swallow pills however she denies difficulty with foods and liquids. She states her Topamax dose was increased about one week befor her GI complaints started and she has since decreased Topamax back to her prior dose. She is very anxious, vague and it is difficult to follow her history. She had C. difficile last year her is concerned that it has returned although she does not have any diarrhea. She denies any antibiotic use for the past several months. Denies weight loss, abdominal pain, constipation, diarrhea, change in stool caliber, melena, hematochezia, nausea, vomiting, dysphagia, reflux symptoms, chest pain.  Current Medications, Allergies, Past Medical History, Past Surgical History, Family History and Social History were reviewed in [employer] record.  Physical Exam: General: Well developed , well nourished, no acute distress Head: Normocephalic and atraumatic Eyes:  sclerae anicteric, EOMI Ears: Normal auditory acuity Mouth: No deformity or lesions Lungs: Clear throughout to auscultation Heart: Regular rate and rhythm; no murmurs, rubs or bruits Abdomen: Soft, minimal epigastric tenderness without rebound or guarding and non distended. No masses, hepatosplenomegaly or hernias noted. Normal Bowel sounds Musculoskeletal: Symmetrical with no gross deformities  Pulses:  Normal pulses noted Extremities: No clubbing, cyanosis, edema or deformities noted Neurological: Alert oriented x 4, grossly nonfocal Psychological:  Alert and cooperative. Very anxious. Vague and wandering history.  Assessment and Recommendations:  1. Upper abd pain, GERD and nausea. Difficulty swallowing pills. History of erosive esophagitis and esophageal stricture on endoscopy in 2010. Probable flare of GERD. R/O  esophagitis, stricture. Intensify antireflux measures. Increase omeprazole to 40 mg twice daily. Refill Zofran. TUMS and Maalox as needed. She is reassured that this is very unlikely to be C. Difficile. Send GI pathogen panel. If her symptoms do not respond proceed with upper endoscopy.  2. Anxiety is contributing to her current complaints. Follow up with PCP.

## 2014-07-17 ENCOUNTER — Telehealth: Payer: Self-pay | Admitting: Gastroenterology

## 2014-07-17 ENCOUNTER — Other Ambulatory Visit: Payer: Managed Care, Other (non HMO)

## 2014-07-17 DIAGNOSIS — R197 Diarrhea, unspecified: Secondary | ICD-10-CM

## 2014-07-17 NOTE — Telephone Encounter (Signed)
Left message for patient to call back  

## 2014-07-17 NOTE — Telephone Encounter (Signed)
She is going to go to an urgent care for her ear pain and sore throat.

## 2014-07-17 NOTE — Telephone Encounter (Signed)
Patient reports that she has fever today T max 101.   She did turn in the stool studies today.  She did not have fever yesterday at the office visit. She is having ear pain and sore throat and  questions that she may have an infection from that.   She is advised that we will call with the results of the stool studies when we get them.

## 2014-07-21 LAB — GASTROINTESTINAL PATHOGEN PANEL PCR
C. difficile Tox A/B, PCR: NEGATIVE
Campylobacter, PCR: NEGATIVE
Cryptosporidium, PCR: NEGATIVE
E coli (ETEC) LT/ST PCR: NEGATIVE
E coli (STEC) stx1/stx2, PCR: NEGATIVE
E coli 0157, PCR: NEGATIVE
Giardia lamblia, PCR: NEGATIVE
Norovirus, PCR: NEGATIVE
Rotavirus A, PCR: NEGATIVE
Salmonella, PCR: NEGATIVE
Shigella, PCR: NEGATIVE

## 2014-07-22 ENCOUNTER — Telehealth: Payer: Self-pay | Admitting: Gastroenterology

## 2014-07-22 NOTE — Telephone Encounter (Signed)
See results note for additional details 

## 2014-07-27 ENCOUNTER — Telehealth: Payer: Self-pay | Admitting: Gastroenterology

## 2014-07-27 NOTE — Telephone Encounter (Signed)
Patient reports continued nausea and vomiting.  She reports that she is having "dry heaves", she is taking zofran.  Do you want to proceed with EGD as in your last office.

## 2014-07-27 NOTE — Telephone Encounter (Signed)
OK to schedule EGD See my office note about returning to her PCP re: anxiety which probably is contributing

## 2014-07-28 NOTE — Telephone Encounter (Signed)
Patient is scheduled for 07/31/14 for pre-visit and EGD for 08/06/14 11:00.  She would like Dr. 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  Discussed returning to her primary care for anxiety and she says that she has no anxiety and does not feel anxious.

## 2014-07-29 ENCOUNTER — Encounter: Payer: Self-pay | Admitting: Gastroenterology

## 2014-07-31 ENCOUNTER — Ambulatory Visit (AMBULATORY_SURGERY_CENTER): Payer: Self-pay

## 2014-07-31 ENCOUNTER — Telehealth: Payer: Self-pay

## 2014-07-31 ENCOUNTER — Other Ambulatory Visit: Payer: Self-pay | Admitting: Gynecology

## 2014-07-31 VITALS — Ht 63.5 in | Wt 175.0 lb

## 2014-07-31 DIAGNOSIS — R110 Nausea: Secondary | ICD-10-CM

## 2014-07-31 DIAGNOSIS — M25552 Pain in left hip: Secondary | ICD-10-CM

## 2014-07-31 NOTE — Telephone Encounter (Signed)
Pt. C/o x a week to two weeks she has been experiencing lower abd/back pain, cramping and nausea. She said the cramping makes her feel like she has a uterus but she has had hysterectomy.  She said she ruled out anything GI with blood tests and cultures but then speculated if it might be her appendix.  She sid the last day or two the pain feels like it could be ovarian as that area is tender. She wants to schedule office visit for Monday and asked if she might could have an ultrasound as well?

## 2014-07-31 NOTE — Telephone Encounter (Signed)
Left detailed message on her cell voice mail. Told her Dr. Loetta Rough okay with u/s and I have put order in. She just needs to call back and speak with Butch Penny who is aware she will be calling and she will get her scheduled.

## 2014-07-31 NOTE — Progress Notes (Signed)
No allergies to eggs or soy (takes flu shot no problems) No home oxygen No diet/weight loss meds No past problems with anesthesia  Has email  Emmi instructions given for endoscopy

## 2014-07-31 NOTE — Telephone Encounter (Signed)
Okay for office visit with ultrasound

## 2014-07-31 NOTE — Progress Notes (Signed)
Pt feels these symptoms may be related to a ruptured ovarian cyst.  Stated, "nausea is relentless.  I take Zofran with some relief but almost ineffective.  I also take the omeprazole"

## 2014-08-03 ENCOUNTER — Ambulatory Visit (INDEPENDENT_AMBULATORY_CARE_PROVIDER_SITE_OTHER): Payer: Managed Care, Other (non HMO)

## 2014-08-03 ENCOUNTER — Other Ambulatory Visit: Payer: Self-pay | Admitting: Gynecology

## 2014-08-03 ENCOUNTER — Encounter: Payer: Self-pay | Admitting: Gynecology

## 2014-08-03 ENCOUNTER — Ambulatory Visit (INDEPENDENT_AMBULATORY_CARE_PROVIDER_SITE_OTHER): Payer: Managed Care, Other (non HMO) | Admitting: Gynecology

## 2014-08-03 DIAGNOSIS — M25552 Pain in left hip: Secondary | ICD-10-CM

## 2014-08-03 DIAGNOSIS — R102 Pelvic and perineal pain: Secondary | ICD-10-CM

## 2014-08-03 DIAGNOSIS — N8300 Follicular cyst of ovary, unspecified side: Secondary | ICD-10-CM

## 2014-08-03 DIAGNOSIS — N949 Unspecified condition associated with female genital organs and menstrual cycle: Secondary | ICD-10-CM

## 2014-08-03 DIAGNOSIS — N831 Corpus luteum cyst of ovary, unspecified side: Secondary | ICD-10-CM

## 2014-08-03 DIAGNOSIS — M25559 Pain in unspecified hip: Secondary | ICD-10-CM

## 2014-08-03 NOTE — Patient Instructions (Signed)
Follow up with me after the endoscopy if you want to proceed with surgery.

## 2014-08-03 NOTE — Progress Notes (Signed)
Deanna George [birth year] [id number]        48 y.o.  Presents for ultrasound and exam due to history of pelvic pain. Patient has a long history of coming and going abdominal pain for the last several years. She underwent a laparoscopic cholecystectomy 01/2013 and seemed to be doing much better but over the last one to 2 months as been having recurrences of significant nausea and vomiting and now with pelvic pain over the last month or so. Patient said it feels like uterine cramping although she is status post LAVH in the past significant for endometriosis also. She is undergoing an endoscopy this coming Thursday but called with this history I asked her to schedule an ultrasound at the same time as her office visit today.  Past medical history,surgical history, problem list, medications, allergies, family history and social history were all reviewed and documented in the EPIC chart.  Directed ROS with pertinent positives and negatives documented in the history of present illness/assessment and plan.  Exam: [name] assistant General appearance:  Normal Abdomen soft nontender without masses guarding rebound organomegaly. Pelvic external BUS vagina normal. Bimanual without masses or tenderness. Rectovaginal exam is normal.  Ultrasound shows right and left ovaries grossly normal with physiologic changes. No cul-de-sac fluid or other pathology noted.  Assessment/Plan:  47 y.o. with long history of intermittent abdominal pain with history of endometriosis, interstitial cystitis and most recently cholecystectomy last year. Now with significant nausea and vomiting being evaluated by gastroenterology. Over the last month or so been having a lot of pelvic pain reminiscent of when she was having menstrual cramps. She is not currently sexual active but does note that she had bilateral "ovarian" pain when the pelvic ultrasound was done with the vaginal probe. I reviewed with her history of endometriosis possibility of  endometriosis causing her pain as well as adhesions or no pathology found. From my standpoint the next up would be proceeding with diagnostic laparoscopy. I reviewed in general was involved with this to include multiple port sites and the recovery period as well as again emphasizing that we may not find pathology and there is no guarantee this will relieve her pain. Patient is going to undergo the endoscopy and then go from there. She'll call me if she is interested in proceeding with the diagnostic laparoscopy.     Anastasio Auerbach MD, 12:36 PM 08/03/2014    Patient presents

## 2014-08-04 ENCOUNTER — Ambulatory Visit: Payer: Managed Care, Other (non HMO) | Admitting: Neurology

## 2014-08-06 ENCOUNTER — Ambulatory Visit (AMBULATORY_SURGERY_CENTER): Payer: Managed Care, Other (non HMO) | Admitting: Gastroenterology

## 2014-08-06 ENCOUNTER — Encounter: Payer: Self-pay | Admitting: Gastroenterology

## 2014-08-06 VITALS — BP 138/81 | HR 59 | Temp 98.9°F | Resp 25 | Ht 63.5 in | Wt 175.0 lb

## 2014-08-06 DIAGNOSIS — K219 Gastro-esophageal reflux disease without esophagitis: Secondary | ICD-10-CM

## 2014-08-06 DIAGNOSIS — D1330 Benign neoplasm of unspecified part of small intestine: Secondary | ICD-10-CM

## 2014-08-06 DIAGNOSIS — R109 Unspecified abdominal pain: Secondary | ICD-10-CM

## 2014-08-06 DIAGNOSIS — R110 Nausea: Secondary | ICD-10-CM

## 2014-08-06 MED ORDER — ESOMEPRAZOLE MAGNESIUM 40 MG PO CPDR: 40.0000 mg | DELAYED_RELEASE_CAPSULE | Freq: Two times a day (BID) | ORAL | Status: DC

## 2014-08-06 MED ORDER — ONDANSETRON 8 MG PO TBDP: 8.0000 mg | ORAL_TABLET | Freq: Three times a day (TID) | ORAL | Status: DC | PRN

## 2014-08-06 MED ORDER — GLYCOPYRROLATE 1 MG PO TABS: 1.0000 mg | ORAL_TABLET | Freq: Two times a day (BID) | ORAL | Status: DC | PRN

## 2014-08-06 MED ORDER — ONDANSETRON 4 MG PO TBDP
8.0000 mg | ORAL_TABLET | Freq: Once | ORAL | Status: AC
  Administered 2014-08-06: 8 mg via ORAL

## 2014-08-06 MED ORDER — SODIUM CHLORIDE 0.9 % IV SOLN: 500.0000 mL | INTRAVENOUS | Status: DC

## 2014-08-06 NOTE — Patient Instructions (Signed)

## 2014-08-06 NOTE — Progress Notes (Signed)
1127  Zofran 8mg  sl given for nausea.  1156 nausea relieved.

## 2014-08-06 NOTE — Progress Notes (Signed)
Called to room to assist during endoscopic procedure.  Patient ID and intended procedure confirmed with present staff. Received instructions for my participation in the procedure from the performing physician.  

## 2014-08-06 NOTE — Op Note (Addendum)
[facility]  [address] [place], [id number]   ENDOSCOPY PROCEDURE REPORT  PATIENT: Kamayah, Pillay  MR#: [id number] BIRTHDATE: 09-23-1966 , 15  yrs. old GENDER: female ENDOSCOPIST: Ladene Artist, MD, [facility] PROCEDURE DATE:  08/06/2014 PROCEDURE:  EGD w/ biopsy ASA CLASS:     Class II INDICATIONS:  history of esophageal reflux, abdominal pain, and nausea. MEDICATIONS: Monitored anesthesia care and Propofol 150 mg IV TOPICAL ANESTHETIC: none DESCRIPTION OF PROCEDURE: After the risks benefits and alternatives of the procedure were thoroughly explained, informed consent was obtained.  The LB [id number] endoscope was introduced through the mouth and advanced to the second portion of the duodenum , Without limitations.  The instrument was slowly withdrawn as the mucosa was fully examined.  ESOPHAGUS: The z-line was located 35cm from the incisors.  The z line appeared variable and biopsies were taken. There was a 1 cm tongue of mucosa extending proximally from the z-line.  The esophagus was otherwise normal. STOMACH: The mucosa and folds of the stomach appeared normal. DUODENUM: The duodenal mucosa showed no abnormalities in the bulb and 2nd part of the duodenum.  Cold forceps biopsies were taken in the bulb and second portion.  Retroflexed views revealed a 3 cm hiatal hernia.  The scope was then withdrawn from the patient and the procedure completed.  COMPLICATIONS: There were no complications.  ENDOSCOPIC IMPRESSION: 1.   Variable z-line 35cm from the incisors 2.   Small hiatal hernia  RECOMMENDATIONS: 1.  Anti-reflux regimen long term 2.  Await pathology results 3.  Continue PPI bid long term 4.  Robinul 1 mg po bid as needed for abd pain  eSigned:  Ladene Artist, MD, [facility] 08/06/2014 11:21 AM   PT:[id number] Tamala Julian, MD

## 2014-08-06 NOTE — Progress Notes (Signed)
Report to PACU, RN, vss, BBS= Clear.  

## 2014-08-06 NOTE — Progress Notes (Signed)
1120 pt. Complains of ongoing nausea.  No vomiting.  Dr. Fuller Plan notified.

## 2014-08-06 NOTE — Progress Notes (Signed)
Pt was seen in [place] ER 08-05-14

## 2014-08-07 ENCOUNTER — Telehealth: Payer: Self-pay | Admitting: *Deleted

## 2014-08-07 NOTE — Telephone Encounter (Signed)
  Follow up Call-  Call back number 08/06/2014  Post procedure Call Back phone  # cell [phone]  Permission to leave phone message Yes     Patient questions:  Do you have a fever, pain , or abdominal swelling? No. Pain Score  0 *  Have you tolerated food without any problems? Yes.    Have you been able to return to your normal activities? Yes.    Do you have any questions about your discharge instructions: Diet   No. Medications  No. Follow up visit  No.  Do you have questions or concerns about your Care? No.  Actions: * If pain score is 4 or above: No action needed, pain <4.

## 2014-08-12 ENCOUNTER — Encounter: Payer: Self-pay | Admitting: Gastroenterology

## 2014-08-13 ENCOUNTER — Encounter: Payer: Self-pay | Admitting: Neurology

## 2014-08-13 ENCOUNTER — Ambulatory Visit (INDEPENDENT_AMBULATORY_CARE_PROVIDER_SITE_OTHER): Payer: Managed Care, Other (non HMO) | Admitting: Neurology

## 2014-08-13 VITALS — BP 130/87 | HR 86 | Ht 64.0 in | Wt 174.0 lb

## 2014-08-13 DIAGNOSIS — M542 Cervicalgia: Secondary | ICD-10-CM

## 2014-08-13 DIAGNOSIS — G43909 Migraine, unspecified, not intractable, without status migrainosus: Secondary | ICD-10-CM

## 2014-08-13 DIAGNOSIS — G43109 Migraine with aura, not intractable, without status migrainosus: Secondary | ICD-10-CM

## 2014-08-13 MED ORDER — RIZATRIPTAN BENZOATE 10 MG PO TBDP: 10.0000 mg | ORAL_TABLET | ORAL | Status: DC | PRN

## 2014-08-13 MED ORDER — NORTRIPTYLINE HCL 10 MG PO CAPS: ORAL_CAPSULE | ORAL | Status: DC
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
## 2014-08-18 ENCOUNTER — Encounter: Payer: Self-pay | Admitting: Gynecology

## 2014-08-18 ENCOUNTER — Ambulatory Visit (INDEPENDENT_AMBULATORY_CARE_PROVIDER_SITE_OTHER): Payer: Managed Care, Other (non HMO) | Admitting: Gynecology

## 2014-08-18 ENCOUNTER — Other Ambulatory Visit (HOSPITAL_COMMUNITY)
Admission: RE | Admit: 2014-08-18 | Discharge: 2014-08-18 | Disposition: A | Discharge status: Home / Self Care | Payer: Managed Care, Other (non HMO) | Source: Ambulatory Visit | Attending: Gynecology | Admitting: Gynecology

## 2014-08-18 VITALS — BP 116/70 | Ht 64.0 in | Wt 173.0 lb

## 2014-08-18 DIAGNOSIS — Z01419 Encounter for gynecological examination (general) (routine) without abnormal findings: Secondary | ICD-10-CM

## 2014-08-18 DIAGNOSIS — Z23 Encounter for immunization: Secondary | ICD-10-CM

## 2014-08-18 DIAGNOSIS — N951 Menopausal and female climacteric states: Secondary | ICD-10-CM

## 2014-08-18 DIAGNOSIS — Z1272 Encounter for screening for malignant neoplasm of vagina: Secondary | ICD-10-CM

## 2014-08-18 LAB — LIPID PANEL
Cholesterol: 173 mg/dL (ref 0–200)
HDL: 47 mg/dL (ref >39)
LDL Cholesterol: 110 mg/dL — ABNORMAL HIGH (ref 0–99)
Total CHOL/HDL Ratio: 3.7 Ratio
Triglycerides: 81 mg/dL (ref <150)
VLDL: 16 mg/dL (ref 0–40)

## 2014-08-18 NOTE — Patient Instructions (Addendum)
Office will follow up with you about the Depo-Lupron shot.  Call to Schedule your mammogram  Facilities in [place]: 1)  [facility], [address] Phone: [phone] 2)  [facility]. [address] [place] Phone: [phone] 3)  Dr. Isaiah Blakes at [facility] [address] Phone: [phone]     Mammogram A mammogram is an X-ray test to find changes in a woman's breast. You should get a mammogram if:  You are 10 years of age or older  You have risk factors.   Your doctor recommends that you have one.  BEFORE THE TEST  Do not schedule the test the week before your period, especially if your breasts are sore during this time.  On the day of your mammogram:  Wash your breasts and armpits well. After washing, do not put on any deodorant or talcum powder on until after your test.   Eat and drink as you usually do.   Take your medicines as usual.   If you are diabetic and take insulin, make sure you:   Eat before coming for your test.   Take your insulin as usual.   If you cannot keep your appointment, call before the appointment to cancel. Schedule another appointment.  TEST  You will need to undress from the waist up. You will put on a hospital gown.   Your breast will be put on the mammogram machine, and it will press firmly on your breast with a piece of plastic called a compression paddle. This will make your breast flatter so that the machine can X-ray all parts of your breast.   Both breasts will be X-rayed. Each breast will be X-rayed from above and from the side. An X-ray might need to be taken again if the picture is not good enough.   The mammogram will last about 15 to 30 minutes.  AFTER THE TEST Finding out the results of your test Ask when your test results will be ready. Make sure you get your test results.  Document Released: 01/26/2009 Document Revised:  10/19/2011 Document Reviewed: 01/26/2009 [facility] Patient Information 2012 [place].    You may obtain a copy of any labs that were done today by logging onto MyChart as outlined in the instructions provided with your AVS (after visit summary). The office will not call with normal lab results but certainly if there are any significant abnormalities then we will contact you.   Health Maintenance, Female A healthy lifestyle and preventative care can promote health and wellness.  Maintain regular health, dental, and eye exams.  Eat a healthy diet. Foods like vegetables, fruits, whole grains, low-fat dairy products, and lean protein foods contain the nutrients you need without too many calories. Decrease your intake of foods high in solid fats, added sugars, and salt. Get information about a proper diet from your caregiver, if necessary.  Regular physical exercise is one of the most important things you can do for your health. Most adults should get at least 150 minutes of moderate-intensity exercise (any activity that increases your heart rate and causes you to sweat) each week. In addition, most adults need muscle-strengthening exercises on 2 or more days a week.   Maintain a healthy weight. The body mass index (BMI) is a screening tool to identify possible weight problems. It provides an estimate of body fat based on height and weight. Your caregiver can help determine your  BMI, and can help you achieve or maintain a healthy weight. For adults 20 years and older:  A BMI below 18.5 is considered underweight.  A BMI of 18.5 to 24.9 is normal.  A BMI of 25 to 29.9 is considered overweight.  A BMI of 30 and above is considered obese.  Maintain normal blood lipids and cholesterol by exercising and minimizing your intake of saturated fat. Eat a balanced diet with plenty of fruits and vegetables. Blood tests for lipids and cholesterol should begin at age 85 and be repeated every 5 years. If  your lipid or cholesterol levels are high, you are over 50, or you are a high risk for heart disease, you may need your cholesterol levels checked more frequently.Ongoing high lipid and cholesterol levels should be treated with medicines if diet and exercise are not effective.  If you smoke, find out from your caregiver how to quit. If you do not use tobacco, do not start.  Lung cancer screening is recommended for adults aged 49 80 years who are at high risk for developing lung cancer because of a history of smoking. Yearly low-dose computed tomography (CT) is recommended for people who have at least a 30-pack-year history of smoking and are a current smoker or have quit within the past 15 years. A pack year of smoking is smoking an average of 1 pack of cigarettes a day for 1 year (for example: 1 pack a day for 30 years or 2 packs a day for 15 years). Yearly screening should continue until the smoker has stopped smoking for at least 15 years. Yearly screening should also be stopped for people who develop a health problem that would prevent them from having lung cancer treatment.  If you are pregnant, do not drink alcohol. If you are breastfeeding, be very cautious about drinking alcohol. If you are not pregnant and choose to drink alcohol, do not exceed 1 drink per day. One drink is considered to be 12 ounces (355 mL) of beer, 5 ounces (148 mL) of wine, or 1.5 ounces (44 mL) of liquor.  Avoid use of street drugs. Do not share needles with anyone. Ask for help if you need support or instructions about stopping the use of drugs.  High blood pressure causes heart disease and increases the risk of stroke. Blood pressure should be checked at least every 1 to 2 years. Ongoing high blood pressure should be treated with medicines, if weight loss and exercise are not effective.  If you are 1 to 46 years old, ask your caregiver if you should take aspirin to prevent strokes.  Diabetes screening involves taking  a blood sample to check your fasting blood sugar level. This should be done once every 3 years, after age 54, if you are within normal weight and without risk factors for diabetes. Testing should be considered at a younger age or be carried out more frequently if you are overweight and have at least 1 risk factor for diabetes.  Breast cancer screening is essential preventative care for women. You should practice "breast self-awareness." This means understanding the normal appearance and feel of your breasts and may include breast self-examination. Any changes detected, no matter how small, should be reported to a caregiver. Women in their 3s and 30s should have a clinical breast exam (CBE) by a caregiver as part of a regular health exam every 1 to 3 years. After age 40, women should have a CBE every year. Starting at age 37, women  should consider having a mammogram (breast X-ray) every year. Women who have a family history of breast cancer should talk to their caregiver about genetic screening. Women at a high risk of breast cancer should talk to their caregiver about having an MRI and a mammogram every year.  Breast cancer gene (BRCA)-related cancer risk assessment is recommended for women who have family members with BRCA-related cancers. BRCA-related cancers include breast, ovarian, tubal, and peritoneal cancers. Having family members with these cancers may be associated with an increased risk for harmful changes (mutations) in the breast cancer genes BRCA1 and BRCA2. Results of the assessment will determine the need for genetic counseling and BRCA1 and BRCA2 testing.  The Pap test is a screening test for cervical cancer. Women should have a Pap test starting at age 21. Between ages 21 and 29, Pap tests should be repeated every 2 years. Beginning at age 30, you should have a Pap test every 3 years as long as the past 3 Pap tests have been normal. If you had a hysterectomy for a problem that was not cancer  or a condition that could lead to cancer, then you no longer need Pap tests. If you are between ages 65 and 70, and you have had normal Pap tests going back 10 years, you no longer need Pap tests. If you have had past treatment for cervical cancer or a condition that could lead to cancer, you need Pap tests and screening for cancer for at least 20 years after your treatment. If Pap tests have been discontinued, risk factors (such as a new sexual partner) need to be reassessed to determine if screening should be resumed. Some women have medical problems that increase the chance of getting cervical cancer. In these cases, your caregiver may recommend more frequent screening and Pap tests.  The human papillomavirus (HPV) test is an additional test that may be used for cervical cancer screening. The HPV test looks for the virus that can cause the cell changes on the cervix. The cells collected during the Pap test can be tested for HPV. The HPV test could be used to screen women aged 30 years and older, and should be used in women of any age who have unclear Pap test results. After the age of 30, women should have HPV testing at the same frequency as a Pap test.  Colorectal cancer can be detected and often prevented. Most routine colorectal cancer screening begins at the age of 50 and continues through age 75. However, your caregiver may recommend screening at an earlier age if you have risk factors for colon cancer. On a yearly basis, your caregiver may provide home test kits to check for hidden blood in the stool. Use of a small camera at the end of a tube, to directly examine the colon (sigmoidoscopy or colonoscopy), can detect the earliest forms of colorectal cancer. Talk to your caregiver about this at age 50, when routine screening begins. Direct examination of the colon should be repeated every 5 to 10 years through age 75, unless early forms of pre-cancerous polyps or small growths are found.  Hepatitis C  blood testing is recommended for all people born from 1945 through 1965 and any individual with known risks for hepatitis C.  Practice safe sex. Use condoms and avoid high-risk sexual practices to reduce the spread of sexually transmitted infections (STIs). Sexually active women aged 25 and younger should be checked for Chlamydia, which is a common sexually transmitted infection. Older women   with new or multiple partners should also be tested for Chlamydia. Testing for other STIs is recommended if you are sexually active and at increased risk.  Osteoporosis is a disease in which the bones lose minerals and strength with aging. This can result in serious bone fractures. The risk of osteoporosis can be identified using a bone density scan. Women ages 52 and over and women at risk for fractures or osteoporosis should discuss screening with their caregivers. Ask your caregiver whether you should be taking a calcium supplement or vitamin D to reduce the rate of osteoporosis.  Menopause can be associated with physical symptoms and risks. Hormone replacement therapy is available to decrease symptoms and risks. You should talk to your caregiver about whether hormone replacement therapy is right for you.  Use sunscreen. Apply sunscreen liberally and repeatedly throughout the day. You should seek shade when your shadow is shorter than you. Protect yourself by wearing long sleeves, pants, a wide-brimmed hat, and sunglasses year round, whenever you are outdoors.  Notify your caregiver of new moles or changes in moles, especially if there is a change in shape or color. Also notify your caregiver if a mole is larger than the size of a pencil eraser.  Stay current with your immunizations. Document Released: 05/15/2011 Document Revised: 02/24/2013 Document Reviewed: 05/15/2011 [facility] Patient Information 2014 [place].

## 2014-08-18 NOTE — Addendum Note (Signed)
Addended by: Nelva Nay on: 08/18/2014 12:21 PM   Modules accepted: Orders

## 2014-08-18 NOTE — Progress Notes (Signed)
Deanna George 01-15-1966 [id number]        48 y.o.  [id number] for annual exam.  Several issues noted below.  Past medical history,surgical history, problem list, medications, allergies, family history and social history were all reviewed and documented as reviewed in the EPIC chart.  ROS:  12 system ROS performed with pertinent positives and negatives included in the history, assessment and plan.   Additional significant findings :  none   Exam: [name] Vitals:   08/18/14 1111  BP: 116/70  Height: 5\' 4"  (1.626 m)  Weight: 173 lb (78.472 kg)   General appearance:  Normal affect, orientation and appearance. Skin: Grossly normal HEENT: Without gross lesions.  No cervical or supraclavicular adenopathy. Thyroid normal.  Lungs:  Clear without wheezing, rales or rhonchi Cardiac: RR, without RMG Abdominal:  Soft, nontender, without masses, guarding, rebound, organomegaly or hernia Breasts:  Examined lying and sitting without masses, retractions, discharge or axillary adenopathy. Pelvic:  Ext/BUS/vagina normal. Pap of cuff done  Adnexa  Without masses or tenderness    Anus and perineum  Normal   Rectovaginal  Normal sphincter tone without palpated masses or tenderness.    Assessment/Plan:  48 y.o. [id number] female for annual exam .   1. Abdominal/pelvic pain. History of laparoscopy x2  2005/2008 with endometriosis and ultimate LAVH 2009.  Patient doing somewhat better since last time I saw her but still having pelvic pain. Had a lot of exacerbation after her pelvic ultrasound leading to an ER evaluation. She reports having a negative CT scan done at that visit. Nausea also seems to be somewhat better. Had recent endoscopy. Actively being followed by gastroenterology.  I again reviewed with her the differential to include endometriosis. No evidence of endometriomas or other pelvic pathology on ultrasound/CT scan. Options for management reviewed to include observation, laparoscopy with  or without oophorectomy or trial of Depo-Lupron for ovarian suppression. If pain improves/results with Depo-Lupron certainly would point towards a GYN etiology and then whether we would proceed with a prolonged treatment course of Depo-Lupron versus considering oophorectomy was discussed. Patient leaning towards trial of Depo-Lupron. Options for one month versus three-month initial injection discussed and she would prefer to one month injection to avoid prolong side effects if she decides against continuing it. Side effect profile to include menopausal symptoms such as significant hot flushes night sweats as well as acceleration of loss of calcium from the bones discussed. Will check with her insurance and then follow up with her as she requests.  Will go ahead and check [place] now as she does note some hot flashes and sweats to make sure that she is not already entering menopause.  2. Mammography 2012. Patient knows she is way overdue and agrees to schedule. SBE monthly reviewed. 3. Pap smear 2010. Pap smear of vaginal cuff done. No history of significant abnormal Pap smears previously. 4. Health maintenance. Lipid profile urinalysis ordered. She's already had recent comprehensive metabolic panels and CBCs at other workups.  Patient will follow up for the Depo-Lupron decision.     Anastasio Auerbach MD, 11:37 AM 08/18/2014

## 2014-08-19 LAB — FOLLICLE STIMULATING HORMONE: FSH: 5.9 m[IU]/mL

## 2014-08-20 LAB — CYTOLOGY - PAP

## 2014-09-14 ENCOUNTER — Encounter: Payer: Self-pay | Admitting: Gynecology

## 2014-09-21 ENCOUNTER — Telehealth: Payer: Self-pay

## 2014-09-21 NOTE — Telephone Encounter (Signed)
I called patient to follow up in regards to Lupron that I sent an order to [facility] for a while back.  I handled the prior auth as well but we still have not received the Lupron.  I left message asking patient if she had received a call from [facility] yet.

## 2014-10-01 ENCOUNTER — Telehealth: Payer: Self-pay

## 2014-10-01 NOTE — Telephone Encounter (Signed)
I contacted patient again because we still have not received her Lupron.  Patient said that she had received word that both doses authorized however she instructed pharmacy to hold the Rx on file for now. She said she financially cannot proceed at this time. She said her discomfort has been manageable to the point she is not sure she even wants to proceed with Lupron.  She said if she decides she wants to she will call and let me know and let her pharmacy know.

## 2014-10-01 NOTE — Telephone Encounter (Signed)
Quality of life decision for the patient.

## 2014-10-13 ENCOUNTER — Ambulatory Visit: Payer: Managed Care, Other (non HMO) | Admitting: Neurology

## 2014-12-03 ENCOUNTER — Ambulatory Visit: Payer: Managed Care, Other (non HMO) | Admitting: Neurology

## 2015-01-05 ENCOUNTER — Telehealth: Payer: Self-pay

## 2015-01-05 NOTE — Telephone Encounter (Signed)
Patient said she was in back in the Fall and you gave her some options.  One was "exploratory surgery" to go in and look around. Has done it before.  She has a week long break from school in 3 weeks and wants to get scheduled for that time frame.

## 2015-01-05 NOTE — Telephone Encounter (Signed)
I will send surgical slip through

## 2015-01-06 ENCOUNTER — Telehealth: Payer: Self-pay | Admitting: *Deleted

## 2015-01-06 NOTE — Telephone Encounter (Signed)
I spoke with patient. She is unsure of dates she is out of school and when she can schedule. She will check tomorrow while at school and call me back as soon as possible.

## 2015-01-06 NOTE — Telephone Encounter (Signed)
Pt called regarding scheduling surgery I informed patient that Juliann Pulse did receive her voicemail and she will call her regarding this. Pt then mention to me that she thinks she may have a UTI infection with some lower pelvic discomfort. I offered to schedule OV, pt declined and said she will call back because she is in class.

## 2015-01-07 ENCOUNTER — Ambulatory Visit: Payer: Managed Care, Other (non HMO) | Admitting: Neurology

## 2015-01-12 ENCOUNTER — Telehealth: Payer: Self-pay

## 2015-01-12 NOTE — Telephone Encounter (Signed)
Patient called today to schedule surgery.  We discussed dates and she wants 02/02/15.  She scheduled  Pre op consult appt and will expect to hear from [facility].

## 2015-01-21 ENCOUNTER — Encounter (HOSPITAL_COMMUNITY): Payer: Self-pay

## 2015-01-21 ENCOUNTER — Other Ambulatory Visit: Payer: Self-pay

## 2015-01-21 ENCOUNTER — Encounter (HOSPITAL_COMMUNITY)
Admission: RE | Admit: 2015-01-21 | Discharge: 2015-01-21 | Disposition: A | Discharge status: Home / Self Care | Payer: Managed Care, Other (non HMO) | Source: Ambulatory Visit | Attending: Gynecology | Admitting: Gynecology

## 2015-01-21 DIAGNOSIS — R102 Pelvic and perineal pain: Secondary | ICD-10-CM | POA: Diagnosis not present

## 2015-01-21 DIAGNOSIS — Z01810 Encounter for preprocedural cardiovascular examination: Secondary | ICD-10-CM | POA: Diagnosis not present

## 2015-01-21 DIAGNOSIS — Z01812 Encounter for preprocedural laboratory examination: Secondary | ICD-10-CM | POA: Insufficient documentation

## 2015-01-21 HISTORY — DX: Cardiac arrhythmia, unspecified: I49.9

## 2015-01-21 HISTORY — DX: Headache: R51

## 2015-01-21 HISTORY — DX: Headache, unspecified: R51.9

## 2015-01-21 LAB — CBC
HCT: 35.7 % — ABNORMAL LOW (ref 36.0–46.0)
Hemoglobin: 12.2 g/dL (ref 12.0–15.0)
MCH: 30 pg (ref 26.0–34.0)
MCHC: 34.2 g/dL (ref 30.0–36.0)
MCV: 87.7 fL (ref 78.0–100.0)
Platelets: 180 10*3/uL (ref 150–400)
RBC: 4.07 MIL/uL (ref 3.87–5.11)
RDW: 12.3 % (ref 11.5–15.5)
WBC: 8 10*3/uL (ref 4.0–10.5)

## 2015-01-21 LAB — BASIC METABOLIC PANEL
Anion gap: 6 (ref 5–15)
BUN: 10 mg/dL (ref 6–23)
CO2: 26 mmol/L (ref 19–32)
Calcium: 9.1 mg/dL (ref 8.4–10.5)
Chloride: 105 mmol/L (ref 96–112)
Creatinine, Ser: 0.73 mg/dL (ref 0.50–1.10)
GFR calc Af Amer: >90 mL/min (ref >90)
GFR calc non Af Amer: >90 mL/min (ref >90)
Glucose, Bld: 102 mg/dL — ABNORMAL HIGH (ref 70–99)
Potassium: 3.5 mmol/L (ref 3.5–5.1)
Sodium: 137 mmol/L (ref 135–145)

## 2015-01-21 NOTE — Patient Instructions (Signed)
Your procedure is scheduled on:02/02/15  Enter through the Main Entrance at :8am Pick up desk phone and dial [phone] and inform us of your arrival.  Please call [phone] if you have any problems the morning of surgery.  Remember: Do not eat food or drink liquids, including water, after midnight:Monday   You may brush your teeth the morning of surgery.  Take these meds the morning of surgery with a sip of water: Losartan  DO NOT wear jewelry, eye make-up, lipstick,body lotion, or dark fingernail polish.  (Polished toes are ok) You may wear deodorant.  If you are to be admitted after surgery, leave suitcase in car until your room has been assigned. Patients discharged on the day of surgery will not be allowed to drive home. Wear loose fitting, comfortable clothes for your ride home.

## 2015-01-28 ENCOUNTER — Encounter: Payer: Self-pay | Admitting: Gynecology

## 2015-01-28 ENCOUNTER — Ambulatory Visit (INDEPENDENT_AMBULATORY_CARE_PROVIDER_SITE_OTHER): Payer: Managed Care, Other (non HMO) | Admitting: Gynecology

## 2015-01-28 VITALS — BP 120/80

## 2015-01-28 DIAGNOSIS — R102 Pelvic and perineal pain: Secondary | ICD-10-CM

## 2015-01-28 NOTE — Patient Instructions (Signed)
Followup for surgery as scheduled. 

## 2015-01-28 NOTE — H&P (Addendum)
Deanna George Aug 28, 1966 [id number]   History and Physical  Chief complaint: pelvic pain, history of endometriosis  History of present illness: 49 y.o. [id number] with history of intermittent abdominal/pelvic pain over the past several years. She's undergone laparoscopic cholecystectomy 2014 which seemed to improve some of her pain but since the fall of 2015 patient has been having recurrent bouts of nausea/vomiting and pelvic pain. Patient states it feels like uterine cramping although she is status post LAVH in the past due to endometriosis.  Patient also has a history of interstitial cystitis but does not feel that this is interstitial cystitis that she is familiar with the symptoms. She underwent an ultrasound which showed normal ovaries bilaterally. Following the ultrasound she had intense pelvic pain that ultimately led to an emergency room evaluation where a CT scan was reportedly negative. She's been evaluated by gastroenterology to include endoscopy and from a nausea vomiting standpoint has done much better. The patient has continued to have almost daily pelvic cramping with bouts of exacerbation with pain. She has no urinary symptoms such as frequency dysuria urgency no constipation diarrhea. Recent FSH normal and negative urinalysis in the past. Options for management to include urologic evaluation now versus laparoscopy rule out endometriosis/pelvic adhesions or other sources of her pain discussed and the patient prefers laparoscopy now is admitted for diagnostic laparoscopy. Patient also has 2 small vaginal cysts that she also requested that I remove it the same time as described below. Lastly we discussed prophylactic salpingectomy per SGO recommendations as discussed below.  Past medical history,surgical history, medications, allergies, family history and social history were all reviewed and documented in the EPIC chart.  ROS:  Was performed and pertinent positives and negatives are included in  the history of present illness.  Exam:  [name] assistant General: well developed, well nourished female, no acute distress HEENT: normal  Lungs: clear to auscultation without wheezing, rales or rhonchi  Cardiac: regular rate without rubs, murmurs or gallops  Abdomen: soft, nontender without masses, guarding, rebound, organomegaly  Pelvic: external bus vagina: with small several millimeter classic sebaceous cyst in her mid right labia majora. Small vaginal cyst posterior fourchette vaginal opening   Adnexa: without masses or tenderness  Rectovaginal exam within normal limits    Assessment/Plan:  49 y.o. [id number] history of daily pelvic cramping with exacerbations of pain. Recently had intense exacerbation following vaginal probe ultrasound. Currently not sexually active. History of endometriosis status post laparoscopy 2 and ultimate LAVH. Admitted for diagnostic laparoscopy rule out endometriosis/pelvic adhesions. I reviewed with the patient particularly given her history of interstitial cystitis that we may find no pathology and she understands and accepts this. If we do not find pathology and/or if we do treated in her pain persist that we will refer to urology for evaluation at that time. I offered urologic evaluation now but she feels that these symptoms do not mimic her interstitial cystitis as before and would prefer to rule out pelvic endometriosis first. I also reviewed the SGO recommendations to offer salpingectomies as a risk reductive surgery for "ovarian" cancer and the patient is interested in doing so. The issues of bilateral oophorectomy was discussed and the options of keeping both ovaries for continued hormone production and a more smooth transition through menopause versus removing her ovaries now is a risk reductive surgery for ovarian cancer was also discussed. The patient prefers to keep both ovaries accepting the risk of ovarian cancer in the future but she gives me permission to  remove  one or both ovaries at the time of surgery if it is my best judgment to do so intraoperatively. The expected intraoperative and postoperative courses as well as the recovery period was reviewed with her. Multiple port sites, insufflation use of sharp blunt dissection, electrocautery harmonic scalpel was all discussed. The risks of infection/hematoma or major requiring reoperation and drainage and prolonged antibiotics was discussed. The risk of hemorrhage necessitating transfusion and the risks of transfusion reviewed to include transfusion reaction, hepatitis, HIV, mad cow disease and other unknown entities. The risk of inadvertent injury to internal organs including bowel, bladder, ureters, vessels, nerves either immediately recognized or delay recognized necessitating major exploratory reparative surgeries or future reparative surgeries including ostomy formation, bowel resection, bladder and ureteral damage repair was all discussed with her. No guarantees as far as pain relief were made in the patient clearly understands that her pain may persist, worsen or change following the procedure and may ultimately be found to be non-gynecologic in nature and she clearly understands and accepts this. Patient's questions were answered to her satisfaction and she is ready to proceed with surgery. The patient also has 2 small benign appearing vaginal cyst that she requests that I removed at the time of surgery.    Anastasio Auerbach MD, 4:05 PM 01/28/2015

## 2015-01-28 NOTE — Progress Notes (Addendum)
Deanna George [birth year] [id number]   Preoperative consult  Chief complaint: pelvic pain, history of endometriosis  History of present illness: 49 y.o. [id number] with history of intermittent abdominal/pelvic pain over the past several years. She's undergone laparoscopic cholecystectomy 2014 which seemed to improve some of her pain but since the fall of 2015 patient has been having recurrent bouts of nausea/vomiting and pelvic pain. Patient states it feels like uterine cramping although she is status post LAVH in the past due to endometriosis.  Patient also has a history of interstitial cystitis but does not feel that this is interstitial cystitis that she is familiar with the symptoms. She underwent an ultrasound which showed normal ovaries bilaterally. Following the ultrasound she had intense pelvic pain that ultimately led to an emergency room evaluation where a CT scan was reportedly negative. She's been evaluated by gastroenterology to include endoscopy and from a nausea vomiting standpoint has done much better. The patient has continued to have almost daily pelvic cramping with bouts of exacerbation with pain. She has no urinary symptoms such as frequency dysuria urgency no constipation diarrhea. Recent FSH normal and negative urinalysis in the past. Options for management to include urologic evaluation now versus laparoscopy rule out endometriosis/pelvic adhesions or other sources of her pain discussed and the patient prefers laparoscopy now is admitted for diagnostic laparoscopy. Patient also has 2 small vaginal cysts that she also requested that I remove it the same time as described below. Lastly we discussed prophylactic salpingectomy per SGO recommendations as discussed below.  Past medical history,surgical history, medications, allergies, family history and social history were all reviewed and documented in the EPIC chart.  ROS:  Was performed and pertinent positives and negatives are included in  the history of present illness.  Exam:  [name] assistant General: well developed, well nourished female, no acute distress HEENT: normal  Lungs: clear to auscultation without wheezing, rales or rhonchi  Cardiac: regular rate without rubs, murmurs or gallops  Abdomen: soft, nontender without masses, guarding, rebound, organomegaly  Pelvic: external bus vagina: with small several millimeter classic sebaceous cyst in her mid right labia majora. Small vaginal cyst posterior fourchette vaginal opening  Adnexa: without masses or tenderness  Rectovaginal exam within normal limits    Assessment/Plan:  49 y.o. [id number] history of daily pelvic cramping with exacerbations of pain. Recently had intense exacerbation following vaginal probe ultrasound. Currently not sexually active. History of endometriosis status post laparoscopy 2 and ultimate LAVH. Admitted for diagnostic laparoscopy rule out endometriosis/pelvic adhesions. I reviewed with the patient particularly given her history of interstitial cystitis that we may find no pathology and she understands and accepts this. If we do not find pathology and/or if we do treated in her pain persist that we will refer to urology for evaluation at that time. I offered urologic evaluation now but she feels that these symptoms do not mimic her interstitial cystitis as before and would prefer to rule out pelvic endometriosis first. I also reviewed the SGO recommendations to offer salpingectomies as a risk reductive surgery for "ovarian" cancer and the patient is interested in doing so. The issues of bilateral oophorectomy was discussed and the options of keeping both ovaries for continued hormone production and a more smooth transition through menopause versus removing her ovaries now is a risk reductive surgery for ovarian cancer was also discussed. The patient prefers to keep both ovaries accepting the risk of ovarian cancer in the future but she gives me permission to  remove one or  both ovaries at the time of surgery if it is my best judgment to do so intraoperatively. The expected intraoperative and postoperative courses as well as the recovery period was reviewed with her. Multiple port sites, insufflation use of sharp blunt dissection, electrocautery harmonic scalpel was all discussed. The risks of infection/hematoma or major requiring reoperation and drainage and prolonged antibiotics was discussed. The risk of hemorrhage necessitating transfusion and the risks of transfusion reviewed to include transfusion reaction, hepatitis, HIV, mad cow disease and other unknown entities. The risk of inadvertent injury to internal organs including bowel, bladder, ureters, vessels, nerves either immediately recognized or delay recognized necessitating major exploratory reparative surgeries or future reparative surgeries including ostomy formation, bowel resection, bladder and ureteral damage repair was all discussed with her. No guarantees as far as pain relief were made in the patient clearly understands that her pain may persist, worsen or change following the procedure and may ultimately be found to be non-gynecologic in nature and she clearly understands and accepts this. Patient's questions were answered to her satisfaction and she is ready to proceed with surgery. The patient also has 2 small benign appearing vaginal cyst that she requests that I removed at the time of surgery.    Anastasio Auerbach MD, 3:50 PM 01/28/2015

## 2015-02-01 MED ORDER — DEXTROSE 5 % IV SOLN
2.0000 g | INTRAVENOUS | Status: AC
  Administered 2015-02-02: 2 g via INTRAVENOUS
  Filled 2015-02-01: qty 2

## 2015-02-02 ENCOUNTER — Encounter (HOSPITAL_COMMUNITY): Payer: Self-pay | Admitting: Certified Registered Nurse Anesthetist

## 2015-02-02 ENCOUNTER — Encounter (HOSPITAL_COMMUNITY)
Admission: RE | Disposition: A | Discharge status: Home / Self Care | Payer: Self-pay | Source: Ambulatory Visit | Attending: Gynecology

## 2015-02-02 ENCOUNTER — Ambulatory Visit (HOSPITAL_COMMUNITY): Payer: Managed Care, Other (non HMO) | Admitting: Anesthesiology

## 2015-02-02 ENCOUNTER — Ambulatory Visit (HOSPITAL_COMMUNITY)
Admission: RE | Admit: 2015-02-02 | Discharge: 2015-02-02 | Disposition: A | Discharge status: Home / Self Care | Payer: Managed Care, Other (non HMO) | Source: Ambulatory Visit | Attending: Gynecology | Admitting: Gynecology

## 2015-02-02 DIAGNOSIS — I10 Essential (primary) hypertension: Secondary | ICD-10-CM | POA: Insufficient documentation

## 2015-02-02 DIAGNOSIS — N803 Endometriosis of pelvic peritoneum: Secondary | ICD-10-CM | POA: Diagnosis not present

## 2015-02-02 DIAGNOSIS — N8320 Unspecified ovarian cysts: Secondary | ICD-10-CM | POA: Diagnosis not present

## 2015-02-02 DIAGNOSIS — K219 Gastro-esophageal reflux disease without esophagitis: Secondary | ICD-10-CM | POA: Insufficient documentation

## 2015-02-02 DIAGNOSIS — A630 Anogenital (venereal) warts: Secondary | ICD-10-CM | POA: Diagnosis not present

## 2015-02-02 DIAGNOSIS — N898 Other specified noninflammatory disorders of vagina: Secondary | ICD-10-CM | POA: Insufficient documentation

## 2015-02-02 DIAGNOSIS — N838 Other noninflammatory disorders of ovary, fallopian tube and broad ligament: Secondary | ICD-10-CM | POA: Insufficient documentation

## 2015-02-02 DIAGNOSIS — M1990 Unspecified osteoarthritis, unspecified site: Secondary | ICD-10-CM | POA: Diagnosis not present

## 2015-02-02 DIAGNOSIS — K589 Irritable bowel syndrome without diarrhea: Secondary | ICD-10-CM | POA: Insufficient documentation

## 2015-02-02 DIAGNOSIS — N736 Female pelvic peritoneal adhesions (postinfective): Secondary | ICD-10-CM | POA: Insufficient documentation

## 2015-02-02 DIAGNOSIS — Z9049 Acquired absence of other specified parts of digestive tract: Secondary | ICD-10-CM | POA: Insufficient documentation

## 2015-02-02 DIAGNOSIS — N809 Endometriosis, unspecified: Secondary | ICD-10-CM | POA: Diagnosis not present

## 2015-02-02 DIAGNOSIS — R102 Pelvic and perineal pain: Secondary | ICD-10-CM | POA: Diagnosis not present

## 2015-02-02 HISTORY — PX: LAPAROSCOPY: SHX197

## 2015-02-02 HISTORY — PX: LAPAROSCOPIC BILATERAL SALPINGECTOMY: SHX5889

## 2015-02-02 SURGERY — LAPAROSCOPY, DIAGNOSTIC
Anesthesia: General | Site: Abdomen

## 2015-02-02 MED ORDER — FENTANYL CITRATE 0.05 MG/ML IJ SOLN
INTRAMUSCULAR | Status: AC
  Filled 2015-02-02: qty 5

## 2015-02-02 MED ORDER — PROMETHAZINE HCL 25 MG/ML IJ SOLN
INTRAMUSCULAR | Status: AC
  Administered 2015-02-02: 12.5 mg via INTRAVENOUS
  Filled 2015-02-02: qty 1

## 2015-02-02 MED ORDER — MEPERIDINE HCL 25 MG/ML IJ SOLN: 6.2500 mg | INTRAMUSCULAR | Status: DC | PRN

## 2015-02-02 MED ORDER — KETOROLAC TROMETHAMINE 30 MG/ML IJ SOLN
INTRAMUSCULAR | Status: AC
  Filled 2015-02-02: qty 1

## 2015-02-02 MED ORDER — ONDANSETRON HCL 4 MG/2ML IJ SOLN
INTRAMUSCULAR | Status: AC
  Filled 2015-02-02: qty 2

## 2015-02-02 MED ORDER — DEXAMETHASONE SODIUM PHOSPHATE 10 MG/ML IJ SOLN
INTRAMUSCULAR | Status: DC | PRN
  Administered 2015-02-02: 4 mg via INTRAVENOUS

## 2015-02-02 MED ORDER — PROMETHAZINE HCL 25 MG/ML IJ SOLN
6.2500 mg | INTRAMUSCULAR | Status: DC | PRN
Start: 2015-02-02 — End: 2015-02-04
  Administered 2015-02-02: 12.5 mg via INTRAVENOUS

## 2015-02-02 MED ORDER — MIDAZOLAM HCL 2 MG/2ML IJ SOLN
INTRAMUSCULAR | Status: DC | PRN
  Administered 2015-02-02: 2 mg via INTRAVENOUS

## 2015-02-02 MED ORDER — ACETAMINOPHEN 325 MG PO TABS: 325.0000 mg | ORAL_TABLET | ORAL | Status: DC | PRN

## 2015-02-02 MED ORDER — SUCCINYLCHOLINE CHLORIDE 20 MG/ML IJ SOLN
INTRAMUSCULAR | Status: DC | PRN
  Administered 2015-02-02: 100 mg via INTRAVENOUS

## 2015-02-02 MED ORDER — 0.9 % SODIUM CHLORIDE (POUR BTL) OPTIME
TOPICAL | Status: DC | PRN
  Administered 2015-02-02: 1000 mL

## 2015-02-02 MED ORDER — OXYCODONE-ACETAMINOPHEN 5-325 MG PO TABS: 1.0000 | ORAL_TABLET | ORAL | Status: DC | PRN

## 2015-02-02 MED ORDER — PROMETHAZINE HCL 25 MG/ML IJ SOLN
12.5000 mg | Freq: Once | INTRAMUSCULAR | Status: AC
  Administered 2015-02-02: 12.5 mg via INTRAVENOUS

## 2015-02-02 MED ORDER — ROCURONIUM BROMIDE 100 MG/10ML IV SOLN
INTRAVENOUS | Status: AC
  Filled 2015-02-02: qty 1

## 2015-02-02 MED ORDER — GLYCOPYRROLATE 0.2 MG/ML IJ SOLN
INTRAMUSCULAR | Status: AC
  Filled 2015-02-02: qty 3

## 2015-02-02 MED ORDER — ACETAMINOPHEN 160 MG/5ML PO SOLN: 325.0000 mg | ORAL | Status: DC | PRN

## 2015-02-02 MED ORDER — PROPOFOL 10 MG/ML IV BOLUS
INTRAVENOUS | Status: AC
  Filled 2015-02-02: qty 20

## 2015-02-02 MED ORDER — HYDROMORPHONE HCL 1 MG/ML IJ SOLN
INTRAMUSCULAR | Status: AC
  Administered 2015-02-02: 0.5 mg via INTRAVENOUS
  Filled 2015-02-02: qty 1

## 2015-02-02 MED ORDER — DEXAMETHASONE SODIUM PHOSPHATE 4 MG/ML IJ SOLN
INTRAMUSCULAR | Status: AC
  Filled 2015-02-02: qty 1

## 2015-02-02 MED ORDER — HYDROMORPHONE HCL 1 MG/ML IJ SOLN
0.2500 mg | INTRAMUSCULAR | Status: DC | PRN
  Administered 2015-02-02 (×2): 0.25 mg via INTRAVENOUS
  Administered 2015-02-02 (×3): 0.5 mg via INTRAVENOUS

## 2015-02-02 MED ORDER — HYDROMORPHONE HCL 1 MG/ML IJ SOLN
INTRAMUSCULAR | Status: AC
  Filled 2015-02-02: qty 1

## 2015-02-02 MED ORDER — HEPARIN SODIUM (PORCINE) 5000 UNIT/ML IJ SOLN
INTRAMUSCULAR | Status: AC
  Filled 2015-02-02: qty 1

## 2015-02-02 MED ORDER — PROPOFOL 10 MG/ML IV BOLUS
INTRAVENOUS | Status: DC | PRN
  Administered 2015-02-02: 160 mg via INTRAVENOUS

## 2015-02-02 MED ORDER — NEOSTIGMINE METHYLSULFATE 10 MG/10ML IV SOLN
INTRAVENOUS | Status: DC | PRN
Start: 2015-02-02 — End: 2015-02-02
  Administered 2015-02-02: 4 mg via INTRAVENOUS

## 2015-02-02 MED ORDER — OXYCODONE-ACETAMINOPHEN 5-325 MG PO TABS
ORAL_TABLET | ORAL | Status: AC
  Filled 2015-02-02: qty 1

## 2015-02-02 MED ORDER — HYDROMORPHONE HCL 1 MG/ML IJ SOLN
INTRAMUSCULAR | Status: DC | PRN
  Administered 2015-02-02 (×2): 0.5 mg via INTRAVENOUS

## 2015-02-02 MED ORDER — SCOPOLAMINE 1 MG/3DAYS TD PT72
1.0000 | MEDICATED_PATCH | Freq: Once | TRANSDERMAL | Status: DC
  Administered 2015-02-02: 1.5 mg via TRANSDERMAL

## 2015-02-02 MED ORDER — GLYCOPYRROLATE 0.2 MG/ML IJ SOLN
INTRAMUSCULAR | Status: DC | PRN
  Administered 2015-02-02: 0.6 mg via INTRAVENOUS

## 2015-02-02 MED ORDER — LIDOCAINE HCL (CARDIAC) 20 MG/ML IV SOLN
INTRAVENOUS | Status: AC
  Filled 2015-02-02: qty 5

## 2015-02-02 MED ORDER — MIDAZOLAM HCL 2 MG/2ML IJ SOLN
0.5000 mg | Freq: Once | INTRAMUSCULAR | Status: AC | PRN
Start: 2015-02-02 — End: 2015-02-02

## 2015-02-02 MED ORDER — ROCURONIUM BROMIDE 100 MG/10ML IV SOLN
INTRAVENOUS | Status: DC | PRN
  Administered 2015-02-02: 30 mg via INTRAVENOUS
  Administered 2015-02-02: 5 mg via INTRAVENOUS

## 2015-02-02 MED ORDER — KETOROLAC TROMETHAMINE 30 MG/ML IJ SOLN
INTRAMUSCULAR | Status: DC | PRN
  Administered 2015-02-02: 30 mg via INTRAVENOUS

## 2015-02-02 MED ORDER — BUPIVACAINE HCL (PF) 0.25 % IJ SOLN
INTRAMUSCULAR | Status: DC | PRN
  Administered 2015-02-02: 6 mL

## 2015-02-02 MED ORDER — OXYCODONE-ACETAMINOPHEN 5-325 MG PO TABS
1.0000 | ORAL_TABLET | Freq: Once | ORAL | Status: AC
  Administered 2015-02-02: 1 via ORAL

## 2015-02-02 MED ORDER — HYDROMORPHONE HCL 1 MG/ML IJ SOLN
INTRAMUSCULAR | Status: AC
  Administered 2015-02-02: 0.25 mg via INTRAVENOUS
  Filled 2015-02-02: qty 1

## 2015-02-02 MED ORDER — KETOROLAC TROMETHAMINE 30 MG/ML IJ SOLN: 30.0000 mg | Freq: Once | INTRAMUSCULAR | Status: AC | PRN

## 2015-02-02 MED ORDER — BUPIVACAINE HCL (PF) 0.25 % IJ SOLN
INTRAMUSCULAR | Status: AC
  Filled 2015-02-02: qty 60

## 2015-02-02 MED ORDER — NEOSTIGMINE METHYLSULFATE 10 MG/10ML IV SOLN
INTRAVENOUS | Status: AC
  Filled 2015-02-02: qty 1

## 2015-02-02 MED ORDER — MIDAZOLAM HCL 2 MG/2ML IJ SOLN
INTRAMUSCULAR | Status: AC
  Filled 2015-02-02: qty 2

## 2015-02-02 MED ORDER — LIDOCAINE HCL (CARDIAC) 20 MG/ML IV SOLN
INTRAVENOUS | Status: DC | PRN
  Administered 2015-02-02: 80 mg via INTRAVENOUS

## 2015-02-02 MED ORDER — SCOPOLAMINE 1 MG/3DAYS TD PT72
MEDICATED_PATCH | TRANSDERMAL | Status: AC
  Administered 2015-02-02: 1.5 mg via TRANSDERMAL
  Filled 2015-02-02: qty 1

## 2015-02-02 MED ORDER — ONDANSETRON HCL 4 MG/2ML IJ SOLN
INTRAMUSCULAR | Status: DC | PRN
  Administered 2015-02-02: 4 mg via INTRAVENOUS

## 2015-02-02 MED ORDER — PROMETHAZINE HCL 25 MG/ML IJ SOLN
INTRAMUSCULAR | Status: AC
  Filled 2015-02-02: qty 1

## 2015-02-02 MED ORDER — SUCCINYLCHOLINE CHLORIDE 20 MG/ML IJ SOLN
INTRAMUSCULAR | Status: AC
  Filled 2015-02-02: qty 10

## 2015-02-02 MED ORDER — LACTATED RINGERS IV SOLN
INTRAVENOUS | Status: DC
  Administered 2015-02-02 (×2): via INTRAVENOUS

## 2015-02-02 MED ORDER — FENTANYL CITRATE 0.05 MG/ML IJ SOLN
INTRAMUSCULAR | Status: DC | PRN
  Administered 2015-02-02: 50 ug via INTRAVENOUS
  Administered 2015-02-02 (×2): 100 ug via INTRAVENOUS

## 2015-02-02 SURGICAL SUPPLY — 39 items
BAG SPEC RTRVL LRG 6X4 10 (ENDOMECHANICALS)
BARRIER ADHS 3X4 INTERCEED (GAUZE/BANDAGES/DRESSINGS) IMPLANT
BLADE SURG 15 STRL LF C SS BP (BLADE) ×3 IMPLANT
BLADE SURG 15 STRL SS (BLADE) ×4
BRR ADH 4X3 ABS CNTRL BYND (GAUZE/BANDAGES/DRESSINGS)
CABLE HIGH FREQUENCY MONO STRZ (ELECTRODE) IMPLANT
CATH ROBINSON RED A/P 16FR (CATHETERS) ×4 IMPLANT
CLOTH BEACON ORANGE TIMEOUT ST (SAFETY) ×4 IMPLANT
COVER MAYO STAND STRL (DRAPES) ×4 IMPLANT
DRSG COVADERM PLUS 2X2 (GAUZE/BANDAGES/DRESSINGS) ×8 IMPLANT
DRSG OPSITE POSTOP 3X4 (GAUZE/BANDAGES/DRESSINGS) IMPLANT
DRSG TELFA 3X8 NADH (GAUZE/BANDAGES/DRESSINGS) ×4 IMPLANT
GLOVE BIO SURGEON STRL SZ7.5 (GLOVE) ×4 IMPLANT
GOWN STRL REUS W/TWL LRG LVL3 (GOWN DISPOSABLE) ×8 IMPLANT
LIQUID BAND (GAUZE/BANDAGES/DRESSINGS) IMPLANT
NS IRRIG 1000ML POUR BTL (IV SOLUTION) ×4 IMPLANT
PACK LAPAROSCOPY BASIN (CUSTOM PROCEDURE TRAY) ×4 IMPLANT
PAD DRESSING TELFA 3X8 NADH (GAUZE/BANDAGES/DRESSINGS) IMPLANT
PAD POSITIONER PINK NONSTERILE (MISCELLANEOUS) ×4 IMPLANT
POUCH SPECIMEN RETRIEVAL 10MM (ENDOMECHANICALS) IMPLANT
PROTECTOR NERVE ULNAR (MISCELLANEOUS) ×4 IMPLANT
SET IRRIG TUBING LAPAROSCOPIC (IRRIGATION / IRRIGATOR) IMPLANT
SHEARS HARMONIC ACE PLUS 36CM (ENDOMECHANICALS) ×2 IMPLANT
SLEEVE XCEL OPT CAN 5 100 (ENDOMECHANICALS) ×2 IMPLANT
SOLUTION ELECTROLUBE (MISCELLANEOUS) IMPLANT
SUT CHROMIC 4 0 PS 2 18 (SUTURE) IMPLANT
SUT PLAIN 4 0 FS 2 27 (SUTURE) ×4 IMPLANT
SUT VIC AB 2-0 CT1 27 (SUTURE)
SUT VIC AB 2-0 CT1 TAPERPNT 27 (SUTURE) IMPLANT
SUT VIC AB 3-0 SH 27 (SUTURE) ×4
SUT VIC AB 3-0 SH 27X BRD (SUTURE) ×1 IMPLANT
SUT VICRYL 0 ENDOLOOP (SUTURE) IMPLANT
SUT VICRYL 0 UR6 27IN ABS (SUTURE) ×4 IMPLANT
SUT VICRYL 4-0 PS2 18IN ABS (SUTURE) IMPLANT
TOWEL OR 17X24 6PK STRL BLUE (TOWEL DISPOSABLE) ×8 IMPLANT
TROCAR XCEL NON-BLD 11X100MML (ENDOMECHANICALS) ×4 IMPLANT
TROCAR XCEL NON-BLD 5MMX100MML (ENDOMECHANICALS) ×4 IMPLANT
WARMER LAPAROSCOPE (MISCELLANEOUS) ×4 IMPLANT
WATER STERILE IRR 1000ML POUR (IV SOLUTION) ×4 IMPLANT

## 2015-02-02 NOTE — Discharge Instructions (Signed)
Postoperative Instructions Laparoscopy  Dr. Phineas Real and the nursing staff have discussed postoperative instructions with you.  If you have any questions please ask them before you leave the hospital, or call Dr Elisabeth Most office at [phone].    MAY TAKE IBUPROFEN (MOTRIN, ADVIL) OR ALEVE AFTER 4:30 PM FOR CRAMPS!! MAY TAKE OFF THE PATCH BEHIND YOUR EAR IN 48 HOURS!! KEEP DRESSINGS DRY FOR 24 HOURS, DRESSINGS MAY COME OFF IN 48 HOURS!!  We would like to emphasize the following instructions:   ? Call the office to make your follow-up appointment as recommended by Dr Phineas Real (usually 1-2 weeks).  ? You were given a prescription, or one was ordered for you at the pharmacy you designated.  Get that prescription filled and take the medication according to instructions.  ? You may eat a regular diet, but slowly until you start having bowel movements.  ? Drink plenty of water daily.  ? Nothing in the vagina (intercourse, douching, objects of any kind) for 2 weeks.  When reinitiating intercourse, if it is uncomfortable, stop and make an appointment with Dr Phineas Real to be evaluated.  ? No driving for several days until the anesthesia has worn off and you are not having significant pain.  Car rides (short) are ok, as long as you are not having significant pain, but no traveling out of town until your postoperative appointment.  ? You may shower, but no baths for two weeks.  Walking up and down stairs is ok.  No heavy lifting, prolonged standing, repeated bending or any working out until your first  postoperative appointment.  ? Rest frequently, listen to your body and do not push yourself and overdo it.  ? Call if:  o Your pain medication does not seem strong enough. o Worsening pain or abdominal bloating o Persistent nausea or vomiting o Difficulty with urination or bowel movements. o Temperature of 101 degrees or higher. o Heavy vaginal bleeding.  If your period is due, you may use  tampons.   o Incisions become red, tender or begin to drain. o You have any questions or concerns

## 2015-02-02 NOTE — Transfer of Care (Signed)
Immediate Anesthesia Transfer of Care Note  Patient: Deanna George  Procedure(s) Performed: Procedure(s): LAPAROSCOPY DIAGNOSTIC, FULGERATION ENDOMETREOSIS, EXCISION RIGHT OVARIAN CYST, LYSIS OF ADHESIONS, EXCISION VULVAR AND VAGINAL CYST  (N/A) BILATERAL SALPINGECTOMY,  (Bilateral)  Patient Location: PACU  Anesthesia Type:General  Level of Consciousness: awake, alert , oriented and patient cooperative  Airway & Oxygen Therapy: Patient Spontanous Breathing and Patient connected to nasal cannula oxygen  Post-op Assessment: Report given to RN and Post -op Vital signs reviewed and stable  Post vital signs: Reviewed and stable  Last Vitals:  Filed Vitals:   02/02/15 0830  BP: 148/90  Pulse: 95  Temp: 36.8 C  Resp: 18    Complications: No apparent anesthesia complications

## 2015-02-02 NOTE — Anesthesia Procedure Notes (Signed)
Procedure Name: Intubation Date/Time: 02/02/2015 9:21 AM Performed by: Raenette Rover Pre-anesthesia Checklist: Patient identified, Patient being monitored, Emergency Drugs available and Suction available Patient Re-evaluated:Patient Re-evaluated prior to inductionOxygen Delivery Method: Circle system utilized Preoxygenation: Pre-oxygenation with 100% oxygen Intubation Type: IV induction, Rapid sequence and Cricoid Pressure applied Laryngoscope Size: Mac and 3 Grade View: Grade I Tube type: Oral Tube size: 7.0 mm Number of attempts: 1 Airway Equipment and Method: Stylet Placement Confirmation: ETT inserted through vocal cords under direct vision,  positive ETCO2,  CO2 detector and breath sounds checked- equal and bilateral Secured at: 22 cm Tube secured with: Tape Dental Injury: Teeth and Oropharynx as per pre-operative assessment

## 2015-02-02 NOTE — Anesthesia Preprocedure Evaluation (Addendum)
Anesthesia Evaluation  Patient identified by MRN, date of birth, ID band Patient awake    Reviewed: Allergy & Precautions, H&P , Patient's Chart, lab work & pertinent test results, reviewed documented beta blocker date and time   History of Anesthesia Complications Negative for: history of anesthetic complications  Airway Mallampati: II  TM Distance: >3 FB Neck ROM: full    Dental   Pulmonary pneumonia -, resolved,  breath sounds clear to auscultation        Cardiovascular Exercise Tolerance: Good hypertension, + dysrhythmias Rhythm:regular Rate:Normal  Sinus tach   Neuro/Psych  Headaches,    GI/Hepatic GERD-  Controlled,  Endo/Other    Renal/GU      Musculoskeletal  (+) Arthritis -,   Abdominal   Peds  Hematology   Anesthesia Other Findings Interstitial cystitis IBS Esophageal stricture Non-compliance Palpitations tachycardia  Reproductive/Obstetrics                            Anesthesia Physical Anesthesia Plan  ASA: III  Anesthesia Plan: General ETT   Post-op Pain Management:    Induction:   Airway Management Planned:   Additional Equipment:   Intra-op Plan:   Post-operative Plan:   Informed Consent: I have reviewed the patients History and Physical, chart, labs and discussed the procedure including the risks, benefits and alternatives for the proposed anesthesia with the patient or authorized representative who has indicated his/her understanding and acceptance.   Dental Advisory Given  Plan Discussed with: CRNA and Surgeon  Anesthesia Plan Comments:         Anesthesia Quick Evaluation RSI if patient nauseous at the time of surgery

## 2015-02-02 NOTE — Op Note (Signed)
ARLIN SAVONA 09-May-1966 [id number]   Post Operative Note   Date of surgery:  02/02/2015  Pre Op Dx:  Pelvic pain, history of endometriosis, vulvar cyst, vaginal cyst.  Post Op Dx:  Pelvic pain, endometriosis, pelvic adhesions, right ovarian cyst, vulvar cyst, vaginal cyst.  Procedure:  Laparoscopic bilateral salpingectomy, lysis of adhesions, fulguration endometriosis, excision of right ovarian cyst, excisional vulvar cyst, excision of vaginal cyst.  Surgeon:  Anastasio Auerbach  Assistant:  Gaylord Shih, RN  Anesthesia:  General  EBL:  minimal  Complications:  None  Specimen:  #1 Right and Left fallopian tube segments #2 Right ovarian cyst #3 Vulvar cyst #4 Vaginal cyst to pathology  Findings: EUA:  External BUS vagina with small 3-4 mm mid right labia majora sebaceous cyst. 10 mm mid posterior fourchette vaginal cyst. Bimanual exam without masses   Operative:  Anterior cul-de-sac normal. Posterior cul-de-sac normal. Small classic powder burn endometriotic implant upper left pelvic brim medial to the course of the external iliac artery, fulgurated. Adhesive band from epiploica to left flank above pelvic brim. Adhesive band from epiploica to left lower anterior abdominal wall. Adhesive band from loops of bowel to right lower anterior abdominal wall all lysed. Left ovary grossly normal free and mobile. Left fallopian tube segment grossly normal. Right ovary with 1-2 cm dark capsular cyst excised intact. Ovary otherwise free and mobile. Right fallopian tube segment grossly normal.  Upper abdominal exam shows appendix normal free and mobile. Liver smooth. Gallbladder not visualized. No upper abdominal adhesions noted.  Procedure:  The patient was taken to the operating room, underwent general anesthesia, was placed in the low dorsal lithotomy position, received a perineal/vaginal preparation with Betadine solution, bladder emptied with in and out Foley catheterization per nursing personnel.  The abdomen was cleansed with DuraPrep skin prep per nursing personnel. The timeout was performed by the surgical team. An EUA was performed and a sponge stick was placed in the vagina for manipulation. The patient was draped in the usual fashion after waiting the appropriate time after her prep application and a repeat vertical infraumbilical incision was made, the 10 mm direct entry trocar was placed under direct visualization without difficulty and the abdomen was insufflated. Right and left 5 mm ports were then placed under direct visualization after transillumination for the vessels without difficulty. Examination of the pelvic organs and upper abdominal exam was carried out with findings noted above. The epiploica and intestinal adhesions as described above were lysed at the junction with the normal peritoneum using the harmonic scalpel without difficulty. The left fallopian tube segment was then elevated, the ureter identified away from the surgical site and taking care to avoid the infundibulopelvic ligament and vessels the fallopian tube segment was excised in its entirety along the mesosalpinx and removed through the infraumbilical port. A similar procedure was carried out on the right. The right ovary was then elevated and the right ovarian cyst was excised from the ovary using the harmonic scalpel noting it to be intact and extracting it through the infraumbilical port. The left pelvic sidewall endometriotic implant was then bipolar cauterized identifying the vessels and ureter away from the site. Reinspection of the ovaries at the end of the procedure showed good vascularization.  The instruments were then removed, the suprapubic ports were removed under direct visualization showing adequate hemostasis and the gas was allowed to escape from the abdomen and the infraumbilical port was backed out under direct visualization showing adequate hemostasis and no evidence of hernia  formation. All skin incisions  were injected using 0.25% Marcaine and the left suprapubic port was closed using an interrupted cutaneous stitches due to slight bleeding to achieve hemostasis. The right suprapubic port was closed with Liquiban and the infraumbilical port was closed using a 0 Vicryl suture in an interrupted subcutaneous fascial stitch and the skin reapproximated using Liquiban.  Sterile dressings were applied, the patient received intraoperative Toradol and attention was then turned to the vaginal portion of the procedure. The patient was placed in the dorsal lithotomy position and the small right mid labia majora sebaceous cyst was identified and excised with a scalpel. The midline posterior fourchette vaginal cyst was elevated and likewise excised with a scalpel and the specimens were sent separately.  Using 3-0 Vicryl the vulvar defect was closed with a single interrupted stitch and the vaginal defect was closed with a running mucosal stitch. Hemostasis was visualized, the patient was awakened without difficulty and was taken to recovery room in good condition having tolerated procedure well.     Anastasio Auerbach MD, 11:09 AM 02/02/2015

## 2015-02-02 NOTE — H&P (Signed)
  The patient was examined.  I reviewed the proposed surgery and consent form with the patient.  The dictated history and physical is current and accurate and all questions were answered. The patient is ready to proceed with surgery and has a realistic understanding and expectation for the outcome.   Anastasio Auerbach MD, 8:59 AM 02/02/2015

## 2015-02-03 ENCOUNTER — Telehealth: Payer: Self-pay | Admitting: *Deleted

## 2015-02-03 NOTE — Telephone Encounter (Signed)
She can take the Advil in between her narcotic. As to her neck is hard to determine without seen patient if she has any question she can come by tomorrow and Dr. Phineas Real can take a look at her

## 2015-02-03 NOTE — Telephone Encounter (Signed)
Pt informed with the below note. 

## 2015-02-03 NOTE — Telephone Encounter (Signed)
(  Deanna George) PT HAD SURGERY ON 02/02/15 LAPAROSCOPIC BILATERAL SALPINGECTOMY SAID HER PAIN MEDICATION IS NOT WORKING FULLY, BUT ASKED COULD SHE TAKE ADVIL IN BETWEEN TAKING PAIN MEDICATION? PT ALSO MENTIONED ON HER FACE AND NECK WHAT APPEARS TO BE SMALL VESSELS THERE NO PAIN IN THIS AREA, SHE THOUGHT THIS COULD JUST BE FROM THE ANESTHESIA. PLEASE ADVISE

## 2015-02-04 ENCOUNTER — Telehealth: Payer: Self-pay | Admitting: Gynecology

## 2015-02-04 ENCOUNTER — Telehealth: Payer: Self-pay | Admitting: *Deleted

## 2015-02-04 ENCOUNTER — Telehealth: Payer: Self-pay | Admitting: Obstetrics and Gynecology

## 2015-02-04 NOTE — Telephone Encounter (Signed)
Call patient for postoperative check. Left message on machine as far as benign pathology without description. Patient to call if she has any issues otherwise will follow up in 2 weeks at her postoperative appointment.

## 2015-02-04 NOTE — Telephone Encounter (Signed)
  Called patient for postoperative check. Left message on machine as far as benign pathology without description. Patient to call if she has any issues otherwise will follow up in 2 weeks at her postoperative appointment.

## 2015-02-04 NOTE — Telephone Encounter (Signed)
#  1 stitches will dissolve. I did not plan on using them originally but the excision area was a little larger than I anticipated. #2 I found both endometriosis and significant intestinal to pelvic adhesions that I lysed all of them.

## 2015-02-04 NOTE — Telephone Encounter (Signed)
Pt informed KW CMA 

## 2015-02-04 NOTE — Telephone Encounter (Signed)
Pt is sorry she missed your call. She was in the shower.  She asks if the stitches in her labial area are dissolveable or not.  She also stated that you mentioned some small minute things in the pelvis that could be causing the pain she asked if it was endometriosis. Pls Advise. KW CMA

## 2015-02-04 NOTE — Telephone Encounter (Signed)
Phone call returned to patient after hours.  Phone number [phone].  Patient is status post laparoscopic bilateral salpingectomy, lysis of adhesions, fulguration of endometriosis, excision of right ovarian cyst, and excision of vaginal cyst 02/02/15.  Dr. Phineas Real.  Calling in to report that she is not recovering from this surgery as she has recovered from other surgeries. Patient states she is running a fever of 100.5 - 101 degrees F. Reports her pain is not managed well.  Last took Percocet and Motrin this morning.  Trying to wean off. Umbilical area feels tight.  Passing flatus.  Had a bowel movement today.  Denies nausea and vomiting.  Denies dysuria.  Denies shortness of breath.  Denies swelling or redness of lower extremities. Ambulating well.   States she is not able to go to the hospital for evaluation tonight despite my recommendation for this.    I have suggested the patient take both the Percocet and Motrin Rx to try to improve her pain tonight.  If fever persists, pain increases, or develops nausea/vomiting/abdominal distention, needs to go to [facility], even if by EMS. Patient will call in to Dr. Phineas Real tomorrow to give a progress report.  She was thankful for the help tonight.

## 2015-02-05 ENCOUNTER — Encounter (HOSPITAL_COMMUNITY): Payer: Self-pay | Admitting: Gynecology

## 2015-02-05 ENCOUNTER — Telehealth: Payer: Self-pay | Admitting: Gynecology

## 2015-02-05 MED ORDER — IBUPROFEN 800 MG PO TABS: 800.0000 mg | ORAL_TABLET | Freq: Three times a day (TID) | ORAL | Status: DC | PRN

## 2015-02-05 NOTE — Telephone Encounter (Signed)
On Call Note.  Follow up from last night call to Dr Quincy Simmonds.  Pt reports feeling better today.  Temp 99.  Eating, voiding, flatus & BM without difficulty.  Without nausea or vomiting. Incision sites ok. Does not feel she is getting good pain relief with percocet as with past surgeries and has avoided taking.  Discussed ER evaluation now.  Patient does not feel anything serious is going on but frustrated she is not feeling better by now.  Recommend Motrin 800 mg Q 8 hrs #30 refill 1, continue percocet, fluids, ambulate with ASAP call precautions reviewed for call back/ER evaluation.

## 2015-02-07 NOTE — Anesthesia Postprocedure Evaluation (Signed)
  Anesthesia Post-op Note  Patient: Deanna George  Procedure(s) Performed: Procedure(s): LAPAROSCOPY DIAGNOSTIC, FULGERATION ENDOMETREOSIS, EXCISION RIGHT OVARIAN CYST, LYSIS OF ADHESIONS, EXCISION VULVAR AND VAGINAL CYST  (N/A) BILATERAL SALPINGECTOMY,  (Bilateral)  Patient Location: PACU  Anesthesia Type:General  Level of Consciousness: awake, alert  and oriented  Airway and Oxygen Therapy: Patient Spontanous Breathing  Post-op Pain: mild  Post-op Assessment: Post-op Vital signs reviewed, Patient's Cardiovascular Status Stable, Respiratory Function Stable, Patent Airway, No signs of Nausea or vomiting and Pain level controlled  Post-op Vital Signs: Reviewed and stable  Complications: No apparent anesthesia complications

## 2015-02-09 ENCOUNTER — Encounter: Payer: Self-pay | Admitting: Gynecology

## 2015-02-09 ENCOUNTER — Ambulatory Visit (INDEPENDENT_AMBULATORY_CARE_PROVIDER_SITE_OTHER): Payer: Managed Care, Other (non HMO) | Admitting: Gynecology

## 2015-02-09 VITALS — BP 136/86 | Temp 98.6°F

## 2015-02-09 DIAGNOSIS — Z9889 Other specified postprocedural states: Secondary | ICD-10-CM

## 2015-02-09 DIAGNOSIS — R102 Pelvic and perineal pain: Secondary | ICD-10-CM

## 2015-02-09 LAB — CBC WITH DIFFERENTIAL/PLATELET
Basophils Absolute: 0 10*3/uL (ref 0.0–0.1)
Basophils Relative: 0 % (ref 0–1)
Eosinophils Absolute: 0.2 10*3/uL (ref 0.0–0.7)
Eosinophils Relative: 2 % (ref 0–5)
HCT: 39.3 % (ref 36.0–46.0)
Hemoglobin: 13.1 g/dL (ref 12.0–15.0)
Lymphocytes Relative: 12 % (ref 12–46)
Lymphs Abs: 1.1 10*3/uL (ref 0.7–4.0)
MCH: 29.4 pg (ref 26.0–34.0)
MCHC: 33.3 g/dL (ref 30.0–36.0)
MCV: 88.1 fL (ref 78.0–100.0)
MPV: 10.6 fL (ref 8.6–12.4)
Monocytes Absolute: 0.3 10*3/uL (ref 0.1–1.0)
Monocytes Relative: 3 % (ref 3–12)
Neutro Abs: 7.8 10*3/uL — ABNORMAL HIGH (ref 1.7–7.7)
Neutrophils Relative %: 83 % — ABNORMAL HIGH (ref 43–77)
Platelets: 249 10*3/uL (ref 150–400)
RBC: 4.46 MIL/uL (ref 3.87–5.11)
RDW: 13 % (ref 11.5–15.5)
WBC: 9.4 10*3/uL (ref 4.0–10.5)

## 2015-02-09 LAB — URINALYSIS W MICROSCOPIC + REFLEX CULTURE
Bilirubin Urine: NEGATIVE
Casts: NONE SEEN
Crystals: NONE SEEN
Glucose, UA: NEGATIVE mg/dL
Ketones, ur: NEGATIVE mg/dL
Leukocytes, UA: NEGATIVE
Nitrite: NEGATIVE
Protein, ur: NEGATIVE mg/dL
Specific Gravity, Urine: <1.005 — ABNORMAL LOW (ref 1.005–1.030)
Urobilinogen, UA: 0.2 mg/dL (ref 0.0–1.0)
WBC, UA: NONE SEEN WBC/hpf (ref <3)
pH: 5.5 (ref 5.0–8.0)

## 2015-02-09 MED ORDER — OXYCODONE-ACETAMINOPHEN 10-325 MG PO TABS: 1.0000 | ORAL_TABLET | ORAL | Status: DC | PRN

## 2015-02-09 NOTE — Progress Notes (Signed)
Deanna George [birth year] [id number]        49 y.o.  [id number] presents postoperatively status post laparoscopic lysis of adhesions, fulguration of endometriosis, excision of right ovarian cyst and excision of vulvar lesions 02/02/2015. Patient reports still feeling a lot of discomfort and not rebounding as quickly as she has with past surgeries. Had temperature to 101 02/04/2015. Has been normal since then. Has not taken any pain medication since yesterday because she was afraid she could not drive to see me today if she could not get a ride due to sedating from the pain medication. She does note though that she does not feel like she is getting pain relief from the current oxycodone 5/325. I added Motrin 800 mg 3 times a day 02/05/2015 and she feels is getting some relief from this. She is drinking fluids and eating. Passing gas, with her last bowel movement yesterday. Does note a lot of bloating and pelvic discomfort.  No vomiting.  Past medical history,surgical history, problem list, medications, allergies, family history and social history were all reviewed and documented in the EPIC chart.  Directed ROS with pertinent positives and negatives documented in the history of present illness/assessment and plan.  Exam:  [name] assistant Filed Vitals:   02/09/15 1322  BP: 136/86  Temp: 98.6 F (37 C)  TempSrc: Oral   General appearance:  Normal Lungs clear bilaterally Cardiac regular rate no RMG Abdomen with bowel sounds throughout. Tender to deep palpation in the left and right lower quadrants. No rebound guarding or masses. Umbilical dressing removed. Umbilical incision and right and left suprapubic incisions intact without evidence of erythema or infection. Left suprapubic suture removed. Pelvic external BUS vagina normal. Small suture right upper labia majora removed. Incision line healing nicely. Bimanual without gross masses and mild tenderness to palpation.  Assessment/Plan:  49 y.o. G3P0012 7  days postop status post laparoscopic lysis of adhesions right ovarian cystectomy and fulguration endometriosis excision of vulvar lesions. Complaining of bloating and discomfort and not feeling as well as she anticipated. She is not using her Percocet 5/325 frequently stating it doesn't seem to help. I wonder if she is not getting adequate pain relief which is limiting her recovery. No overt evidence of significant issues to suggest injury such as bowel/bladder with eating, voiding, flatus and bowel movements currently afebrile. Urinalysis today negative. Check baseline CBC. Increase pain medication with Percocet 10/325 #30.  Encourage ambulation with ASAP call precautions reviewed as far as increasing temperature, nausea/vomiting, increasing pain all reviewed. Patient has routine appointment scheduled next week and will keep this regardless call sooner if any issues. I reviewed her benign pathology results and pictures from the surgery with her.    Anastasio Auerbach MD, 3:26 PM 02/09/2015    \

## 2015-02-09 NOTE — Patient Instructions (Signed)
Call if fevers, nausea/vomiting or feeling worse.  Follow up at your scheduled appointment next week.

## 2015-02-10 ENCOUNTER — Telehealth: Payer: Self-pay | Admitting: *Deleted

## 2015-02-10 MED ORDER — HYDROMORPHONE HCL 2 MG PO TABS: 2.0000 mg | ORAL_TABLET | Freq: Four times a day (QID) | ORAL | Status: DC | PRN

## 2015-02-10 NOTE — Telephone Encounter (Signed)
Yes she can have Dilaudid 2 mg tablet #20 one by mouth every 6 hours when necessary pain

## 2015-02-10 NOTE — Telephone Encounter (Signed)
Pt is post laparoscopic lysis of adhesions right ovarian cystectomy and fulguration endometriosis excision of vulvar lesions. Was seen in office for abdominal discomfort yesterday given Rx for Percocet 10/325 #30 pt said medication is making her feel awful, c/o being nauseated,and lightheaded with discomfort somewhat. Pt said after speaking with her mother patient was informed that she had not done well with acetaminophen. Pt is not having chills, fever, vomiting, doing all you recommended at [place]. She asked if something else could be prescribed without acetaminophen? Please advise

## 2015-02-10 NOTE — Telephone Encounter (Signed)
Pt informed with the below note, Rx will be up front for pick.

## 2015-02-11 ENCOUNTER — Telehealth: Payer: Self-pay | Admitting: *Deleted

## 2015-02-11 ENCOUNTER — Observation Stay (HOSPITAL_COMMUNITY)
Admission: AD | Admit: 2015-02-11 | Discharge: 2015-02-12 | Disposition: A | Discharge status: Home / Self Care | Payer: Managed Care, Other (non HMO) | Source: Ambulatory Visit | Attending: Gynecology | Admitting: Gynecology

## 2015-02-11 ENCOUNTER — Telehealth: Payer: Self-pay | Admitting: Gynecology

## 2015-02-11 ENCOUNTER — Observation Stay (HOSPITAL_COMMUNITY): Payer: Managed Care, Other (non HMO)

## 2015-02-11 ENCOUNTER — Encounter (HOSPITAL_COMMUNITY): Payer: Self-pay | Admitting: *Deleted

## 2015-02-11 DIAGNOSIS — G8918 Other acute postprocedural pain: Secondary | ICD-10-CM | POA: Diagnosis not present

## 2015-02-11 DIAGNOSIS — R109 Unspecified abdominal pain: Secondary | ICD-10-CM

## 2015-02-11 LAB — COMPREHENSIVE METABOLIC PANEL
ALT: 29 U/L (ref 0–35)
AST: 28 U/L (ref 0–37)
Albumin: 4.2 g/dL (ref 3.5–5.2)
Alkaline Phosphatase: 57 U/L (ref 39–117)
Anion gap: 7 (ref 5–15)
BUN: 5 mg/dL — ABNORMAL LOW (ref 6–23)
CO2: 27 mmol/L (ref 19–32)
Calcium: 9 mg/dL (ref 8.4–10.5)
Chloride: 102 mmol/L (ref 96–112)
Creatinine, Ser: 0.77 mg/dL (ref 0.50–1.10)
GFR calc Af Amer: >90 mL/min (ref >90)
GFR calc non Af Amer: >90 mL/min (ref >90)
Glucose, Bld: 106 mg/dL — ABNORMAL HIGH (ref 70–99)
Potassium: 3.9 mmol/L (ref 3.5–5.1)
Sodium: 136 mmol/L (ref 135–145)
Total Bilirubin: 0.2 mg/dL — ABNORMAL LOW (ref 0.3–1.2)
Total Protein: 7.2 g/dL (ref 6.0–8.3)

## 2015-02-11 LAB — URINALYSIS, ROUTINE W REFLEX MICROSCOPIC
Bilirubin Urine: NEGATIVE
Glucose, UA: NEGATIVE mg/dL
Hgb urine dipstick: NEGATIVE
Ketones, ur: NEGATIVE mg/dL
Leukocytes, UA: NEGATIVE
Nitrite: NEGATIVE
Protein, ur: NEGATIVE mg/dL
Specific Gravity, Urine: 1.015 (ref 1.005–1.030)
Urobilinogen, UA: 0.2 mg/dL (ref 0.0–1.0)
pH: 6.5 (ref 5.0–8.0)

## 2015-02-11 LAB — CBC
HCT: 37 % (ref 36.0–46.0)
Hemoglobin: 12.4 g/dL (ref 12.0–15.0)
MCH: 29.5 pg (ref 26.0–34.0)
MCHC: 33.5 g/dL (ref 30.0–36.0)
MCV: 88.1 fL (ref 78.0–100.0)
Platelets: 201 10*3/uL (ref 150–400)
RBC: 4.2 MIL/uL (ref 3.87–5.11)
RDW: 12.5 % (ref 11.5–15.5)
WBC: 8.8 10*3/uL (ref 4.0–10.5)

## 2015-02-11 MED ORDER — ONDANSETRON HCL 4 MG/2ML IJ SOLN
4.0000 mg | Freq: Four times a day (QID) | INTRAMUSCULAR | Status: DC | PRN
  Administered 2015-02-11: 4 mg via INTRAVENOUS
  Filled 2015-02-11: qty 2

## 2015-02-11 MED ORDER — DEXTROSE-NACL 5-0.9 % IV SOLN
INTRAVENOUS | Status: DC
  Administered 2015-02-11 – 2015-02-12 (×2): via INTRAVENOUS

## 2015-02-11 MED ORDER — IOHEXOL 300 MG/ML  SOLN
100.0000 mL | Freq: Once | INTRAMUSCULAR | Status: AC | PRN
  Administered 2015-02-11: 100 mL via INTRAVENOUS

## 2015-02-11 MED ORDER — OXYCODONE-ACETAMINOPHEN 5-325 MG PO TABS
1.0000 | ORAL_TABLET | ORAL | Status: DC | PRN
  Administered 2015-02-11 – 2015-02-12 (×2): 2 via ORAL
  Filled 2015-02-11 (×2): qty 2

## 2015-02-11 MED ORDER — ONDANSETRON HCL 4 MG PO TABS
4.0000 mg | ORAL_TABLET | Freq: Four times a day (QID) | ORAL | Status: DC | PRN
  Administered 2015-02-12 (×2): 4 mg via ORAL
  Filled 2015-02-11 (×2): qty 1

## 2015-02-11 MED ORDER — IOHEXOL 300 MG/ML  SOLN: 50.0000 mL | INTRAMUSCULAR | Status: AC

## 2015-02-11 MED ORDER — MORPHINE SULFATE 4 MG/ML IJ SOLN
1.0000 mg | INTRAMUSCULAR | Status: DC | PRN
  Administered 2015-02-11 (×2): 1 mg via INTRAVENOUS
  Administered 2015-02-12: 2 mg via INTRAVENOUS
  Filled 2015-02-11 (×3): qty 1

## 2015-02-11 NOTE — MAU Note (Signed)
Laparascopic surgery by Dr. Phineas Real 9 days ago.  Pt C/O pain ever since surgery, was seen in office on Tuesday.  Also had started leaking from incision.  Was prescribed percocet but is not really helping.  C/O intermittent fever.  Prescription changed to dilaudid yesterday - not helping much either.  Pt states her incision is oozing more discharge, also nauseated.  Temp today was 100.

## 2015-02-11 NOTE — Telephone Encounter (Signed)
Pt post status post laparoscopic lysis of adhesions, fulguration of endometriosis, excision of right ovarian cyst and excision of vulvar lesions 02/02/2015. She noticed this am at belly button some small amount of bleeding, not large amount. She has a bandage on area blood has leaked though once, if she removes bandage blood does come out. Other wise she keep area covered and does note blood.  No other symptoms such as vomiting, fever chills. I did offer OV to be seen, but said she was her tuesday and mention this to you. Asked me to relay. Please advise

## 2015-02-11 NOTE — Telephone Encounter (Signed)
Patient calls with leakage from her umbilical incision. Also complaining of continued abdominal pain. Recommended emergency room evaluation now and patient agrees to go to the hospital.

## 2015-02-11 NOTE — H&P (Addendum)
Deanna George July 02, 1966 [id number]   History and Physical  Chief complaint: Generalized abdominal pain, 9 days postoperative, oozing infraumbilical incision  History of present illness: 49 y.o. G3P0012 9 days postop status post laparoscopic lysis of adhesions, fulguration of endometriosis, resection right ovarian cyst and bilateral salpingectomy. Patient relates never feeling quite right after surgery having had numerous surgeries in the past. She has had issues with pain control noting more abdominal pain than she anticipated. She does relate though that she did not take her pain medication regularly trying to wean herself off early. She was eating, taking fluids, voiding and having bowel movements. Episode of one fever 101 2 days postop but has remained afebrile since then. Was evaluated in the office 2 days ago and overall was found to be doing well. WBC 9.4, hemoglobin 13 and a urine analysis unremarkable excepting rare bacteria. Had adjustment in her pain medication from the Percocet to Dilaudid 2 mg but still is not achieving adequate pain relief with this. Today she noticed that her umbilical incision has started to drain with some vaginal blood noted on a pad. She is status post hysterectomy in the past.  She also notes some discomfort with urination. Last bowel movement reported on Monday although is continuing to eat and drink and void. Also notes passage of flatus today.  Patient has had some nausea today but no vomiting.  Past medical history,surgical history, medications, allergies, family history and social history were all reviewed and documented in the EPIC chart.  ROS:  Was performed and pertinent positives and negatives are included in the history of present illness.  Exam:  Triage nurse assistant General: somewhat distressed in appearance complaining of abdominal discomfort HEENT: normal  Lungs: clear to auscultation without wheezing, rales or rhonchi  Cardiac: regular rate without  rubs, murmurs or gallops  Abdomen: soft with decreased bowel sounds, although present. No rebound guarding or masses. Minimal tenderness throughout. Infraumbilical incision Dermabond removed. Incision slightly open. Pressure relieved several cc of dark old blood. No palpable hematoma or mass to suggest hernia. No evidence of cellulitis or infection. Suprapubic skin incisions intact without evidence of infection. Pelvic: external bus vagina: normal with area of prior biopsy right mid labia healed. Speculum exam without evidence of bleeding.  Bimanual exam without gross masses or significant tenderness.   Assessment/Plan:  49 y.o. G3P0012 9 days postoperative status post laparoscopic lysis of adhesions, fulguration of endometriosis, bilateral salpingectomies and right ovarian cystectomy. Complaining of continued pain since surgery. Intermittent low-grade fevers at home in the 99-100 range.  Had been eating, drinking, voiding and having bowel movements.  Has drank and had something to eat today with reported flatus.  Exam shows mild generalized abdominal tenderness and a small umbilical hematoma which was opened and drained. Given overall picture will admit tonight for observation, IV hydration, CBC, comprehensive metabolic panel urinalysis, pain management and CT scan to rule out intra-abdominal process. Reviewed differential with the patient to include prolonged but normal postoperative recovery, urinary tract infection, more serious complications such as bowel injury or abscess.   Anastasio Auerbach MD, 7:30 PM 02/11/2015

## 2015-02-11 NOTE — Telephone Encounter (Signed)
I would just watch for now and put a slight pressure dressing on it. Sometimes the incisions can drain a little bit but when I saw her 2 days ago everything was intact.  Is the Dilaudid better for her pain?

## 2015-02-12 ENCOUNTER — Encounter (HOSPITAL_COMMUNITY): Payer: Self-pay | Admitting: *Deleted

## 2015-02-12 MED ORDER — ONDANSETRON HCL 4 MG PO TABS: 4.0000 mg | ORAL_TABLET | Freq: Four times a day (QID) | ORAL | Status: DC | PRN

## 2015-02-12 MED ORDER — BISACODYL 10 MG RE SUPP
10.0000 mg | Freq: Once | RECTAL | Status: AC
  Administered 2015-02-12: 10 mg via RECTAL
  Filled 2015-02-12: qty 1

## 2015-02-12 NOTE — Progress Notes (Signed)
Pt is discharged in the care of [facility]. No equipment neede for home use. Denies pain or discomfort. States pain was already feeling better. Abdominal incision was clean and dry. Understands alldischarge instructions well. Questions asked and answered.

## 2015-02-12 NOTE — Progress Notes (Signed)
CT report returns showing "mild to moderate right-sided hydronephrosis, with distention of the right renal pelvis. 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 The right ureter is decompressed".  I reviewed the patient's clinical history and CT report with Dr Arletha Pili, urologist on call.  He feels this is a chronic issue and not related to the surgery and not the source of her pain.  I called the floor and nursing reports the patient is feeling better after moving around.  CT shows some fluid tracking along laparoscopy tract c/w her small hematoma.  I suspect her abdominal pain may be secondary to sluggish bowels possibly related to narcotics/surgery.  Will dulcolax tonight, IV hydrate and reevaluate in the AM.  Dr Karsten Ro recommended the patient be evaluated in urology outpatient non emergently.

## 2015-02-12 NOTE — Progress Notes (Signed)
Deanna George 1966-09-13 [id number]     s/p   Subjective: Patient reports no acute distress, pain severity reported mild, Yes.   taking PO, Yes.   voiding, Yes.   ambulating, Yes.   passing flatus  Objective: Vital signs in last 24 hours: Temp:  [98.2 F (36.8 C)-99 F (37.2 C)] 98.2 F (36.8 C) (04/01 0519) Pulse Rate:  [80-99] 80 (04/01 0519) Resp:  [18-20] 18 (04/01 0519) BP: (129-157)/(71-90) 129/71 mmHg (04/01 0519) SpO2:  [98 %-100 %] 98 % (04/01 0519) Last BM Date: 02/08/15  EXAM General: awake, no distress Resp: clear to auscultation bilaterally Cardio: regular rate and rhythm, S1, S2 normal, no murmur, click, rub or gallop GI: soft, minimal tender, bowel sounds active, incisions infraumbilical scabbed over without drainage. Bilateral suprapubic incisions healed nicely Lower Extremities: Without swelling or tenderness  Lab Results:   Recent Labs  02/09/15 1352 02/11/15 1940  WBC 9.4 8.8  HGB 13.1 12.4  HCT 39.3 37.0  PLT 249 201    Assessment: s/p : Doing better this morning. Had large bowel movement last evening after suppository and now actively passing gas with active bowel sounds. Still with some mild diffuse abdominal tenderness but much better than last evening. Taking by mouth well requesting regular diet. Voiding without difficulty. Pain control. With oral medication.  Reviewed findings of the CT with the patient to include the right hydronephrosis. Need to follow up with urology as an outpatient was discussed and we will arrange this for her.  Discussed with her, at least at this point I think her pain is due to sluggish bowels as her pain seems much better now that she is having her bowel movement.  She has remained afebrile since admission with a normal white count and CT did not show any fluid collections, evidence of abscess or other abnormalities.  Plan:  Will discharge home today.  ASAP call precautions were reviewed and I encouraged her to call me if  she has any questions. Recommended light liquid diet over the next day or 2 until her bowels are moving on a regular basis. Patient already has an appointment to see me next week and will follow up with this appointment. She has prescriptions for pain medication already at home.      Anastasio Auerbach MD, 6:50 AM 02/12/2015

## 2015-02-12 NOTE — Discharge Instructions (Signed)
Postoperative Instructions Laparoscopy ° °Dr. Aijalon Demuro and the nursing staff have discussed postoperative instructions with you.  If you have any questions please ask them before you leave the hospital, or call Dr Billie Trager’s office at [phone].   ° °We would like to emphasize the following instructions: ° ° °? Call the office to make your follow-up appointment as recommended by Dr Joyleen Haselton (usually 1-2 weeks). ° °? You were given a prescription, or one was ordered for you at the pharmacy you designated.  Get that prescription filled and take the medication according to instructions. ° °? You may eat a regular diet, but slowly until you start having bowel movements. ° °? Drink plenty of water daily. ° °? Nothing in the vagina (intercourse, douching, objects of any kind) for 2 weeks.  When reinitiating intercourse, if it is uncomfortable, stop and make an appointment with Dr Ariel Dimitri to be evaluated. ° °? No driving for several days until the anesthesia has worn off and you are not having significant pain.  Car rides (short) are ok, as long as you are not having significant pain, but no traveling out of town until your postoperative appointment. ° °? You may shower, but no baths for two weeks.  Walking up and down stairs is ok.  No heavy lifting, prolonged standing, repeated bending or any “working out” until your first  postoperative appointment. ° °? Rest frequently, listen to your body and do not push yourself and overdo it. ° °? Call if: ° °o Your pain medication does not seem strong enough. °o Worsening pain or abdominal bloating °o Persistent nausea or vomiting °o Difficulty with urination or bowel movements. °o Temperature of 101 degrees or higher. °o Heavy vaginal bleeding.  If your period is due, you may use tampons.   °o Incisions become red, tender or begin to drain. °o You have any questions or concerns °

## 2015-02-12 NOTE — Discharge Summary (Signed)
Deanna George 07-27-1966 [id number]   Discharge Summary  Date of Admission:  02/11/2015  Date of Discharge:  02/12/2015  Discharge Diagnosis:  Abdominal bloating, postoperative state, small infraumbilical incision hematoma   Hospital Course:  The patient was admitted 9 days postoperative from laparoscopic bilateral salpingectomy, lysis of adhesions, excision right ovarian cyst. She was having increasing abdominal discomfort and bloating and also had some drainage from her infraumbilical incision. She was eating drinking and passing flatus but notes last bowel movement was 3 days before admission.  The Dermabond was removed from the infraumbilical incision and a small amount of dark old blood was spontaneously extruded consistent with a small hematoma. The incision was left open to granulate by secondary intention. The patient also was having a lot of abdominal bloating and discomfort. She was afebrile throughout her hospital course with a normal white count of 8.8 and normal hemoglobin of 12.4. Her urinalysis was negative. A CT scan was obtained which showed mild to moderate right hydronephrosis consistent with ureteropelvic obstruction with a normal decompressed right ureter. Also a small right nonobstructing renal stone noted. No evidence of abdominal fluid collection or abscess with normal bowel shadowing. Consultation with Dr. Kathie Rhodes felt that this was a chronic change and not secondary to surgery or a cause of her discomfort and he recommended she follow up routinely with urology as an outpatient. The patient received a suppository which resulted in a large bowel movement and passage of gas and she subsequently improved with her abdominal discomfort and was discharged the morning following admission ambulating well, voiding, passing flatus and tolerating oral diet without difficulty. She received precautions and instructions with planned follow up appointment one week after discharge. She has pain  medication at home and was given a prescription for Zofran per AVS.     Anastasio Auerbach MD, 11:48 AM 02/12/2015

## 2015-02-12 NOTE — Telephone Encounter (Signed)
Spoke with patient this am and she went to ER yesterday per patient Dr.Fontaine told her to go.

## 2015-02-15 ENCOUNTER — Telehealth: Payer: Self-pay | Admitting: *Deleted

## 2015-02-15 NOTE — Telephone Encounter (Signed)
-----   Message from Anastasio Auerbach, MD sent at 02/12/2015 11:56 AM EDT ----- The patient needs appointment with urology next week reference recent admission for abdominal pain now resolving. CT scan showed mild to moderate right hydronephrosis with normal right ureteral diameter. Consultation with Dr. Leonidas Romberg recommended follow up as an outpatient.  She is postop status post laparoscopic lysis of adhesions and he did not feel that this was secondary to the surgery but a more chronic issue.

## 2015-02-15 NOTE — Telephone Encounter (Signed)
Appointment with Dr.Grapey on 02/17/15 @ 10:45am, notes faxed to [facility] left message on pt voicemail time and date of appointment. I told pt to call me to confirm she received my message.

## 2015-02-16 NOTE — Telephone Encounter (Signed)
Pt called me back to let me know she received my message

## 2015-02-17 ENCOUNTER — Other Ambulatory Visit (HOSPITAL_COMMUNITY): Payer: Self-pay | Admitting: Urology

## 2015-02-17 ENCOUNTER — Ambulatory Visit (INDEPENDENT_AMBULATORY_CARE_PROVIDER_SITE_OTHER): Payer: Managed Care, Other (non HMO) | Admitting: Gynecology

## 2015-02-17 ENCOUNTER — Encounter: Payer: Self-pay | Admitting: Gynecology

## 2015-02-17 VITALS — BP 136/84

## 2015-02-17 DIAGNOSIS — Z9889 Other specified postprocedural states: Secondary | ICD-10-CM

## 2015-02-17 DIAGNOSIS — N135 Crossing vessel and stricture of ureter without hydronephrosis: Secondary | ICD-10-CM

## 2015-02-17 DIAGNOSIS — Q6239 Other obstructive defects of renal pelvis and ureter: Secondary | ICD-10-CM

## 2015-02-17 DIAGNOSIS — Q6212 Congenital occlusion of ureterovesical orifice: Secondary | ICD-10-CM

## 2015-02-17 DIAGNOSIS — Q6211 Congenital occlusion of ureteropelvic junction: Principal | ICD-10-CM

## 2015-02-17 MED ORDER — TRAMADOL HCL 50 MG PO TABS: 50.0000 mg | ORAL_TABLET | Freq: Four times a day (QID) | ORAL | Status: DC | PRN

## 2015-02-17 NOTE — Patient Instructions (Signed)
Follow up in one month for recheck.

## 2015-02-17 NOTE — Progress Notes (Signed)
Patient ID: Deanna George, female   DOB: August 15, 1966, 49 y.o.   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 He wrote in a napkin out of school from March 22 to April 12.  If you have any questions please call patient at [phone]. You can mail letter to her.  I sent patient a letter of excuse for school and included above dates.

## 2015-02-17 NOTE — Progress Notes (Signed)
Deanna George Jul 24, 1966 [id number]        49 y.o.  [id number] Presents for postoperative checkup at 2 weeks status post laparoscopic lysis of adhesions, right ovarian cystectomy and bilateral salpingectomies. Postop course was complicated by worsening pain which led to a readmission 02/11/2015 for overnight observation. A CT scan showed right mild to moderate hydronephrosis without evidence of ureteral dilatation consistent with a ureteropelvic constriction. Patient subsequently had a large bowel movement which improved her pain during the hospitalization she was discharged the morning following admission.  Patient saw Dr. Risa Grill today in follow up who on reviewing a prior CT scan felt that there was some evidence of hydronephrosis previously. He plans on following for now and she has a follow up appointment with him. Patient states she's feeling a lot better now having regular bowel movements and voiding without difficulty. Does have some fleeting lower pelvic pain but overall much better. She finds the Percocet is too sedating and causing some GI upset.  Past medical history,surgical history, problem list, medications, allergies, family history and social history were all reviewed and documented in the EPIC chart.  Directed ROS with pertinent positives and negatives documented in the history of present illness/assessment and plan.  Exam: [name] assistant Filed Vitals:   02/17/15 1459  BP: 136/84   General appearance:  Normal Abdomen soft with active bowel sounds. Incision sites are all healing nicely. No significant tenderness on palpation or masses. Pelvic bimanual without masses or tenderness  Assessment/Plan:  49 y.o. [id number] with normal postoperative check up 2 weeks status post above surgery. Tramadol 50 mg #30 provided for pain relief. She is on nortriptyline and I reviewed the potential cross reactivity with tramadol and possible increased seizure risk as alerted through Epic. Patient states that  she has taken tramadol in the past with her nortriptyline without issues and is comfortable using it. I did asked patient to return in one month for follow up. She'll continue to follow up with Dr. Risa Grill as scheduled.    Anastasio Auerbach MD, 3:18 PM 02/17/2015

## 2015-02-24 ENCOUNTER — Telehealth: Payer: Self-pay | Admitting: *Deleted

## 2015-02-24 ENCOUNTER — Ambulatory Visit (HOSPITAL_COMMUNITY)
Admission: RE | Admit: 2015-02-24 | Discharge: 2015-02-24 | Disposition: A | Discharge status: Home / Self Care | Payer: Managed Care, Other (non HMO) | Source: Ambulatory Visit | Attending: Urology | Admitting: Urology

## 2015-02-24 DIAGNOSIS — N135 Crossing vessel and stricture of ureter without hydronephrosis: Secondary | ICD-10-CM | POA: Insufficient documentation

## 2015-02-24 MED ORDER — TECHNETIUM TC 99M MERTIATIDE
16.0000 | Freq: Once | INTRAVENOUS | Status: AC | PRN
  Administered 2015-02-24: 16 via INTRAVENOUS

## 2015-02-24 MED ORDER — FUROSEMIDE 10 MG/ML IJ SOLN
38.0000 mg | Freq: Once | INTRAMUSCULAR | Status: AC
  Administered 2015-02-24: 38 mg via INTRAVENOUS
  Filled 2015-02-24: qty 4

## 2015-02-24 NOTE — Telephone Encounter (Signed)
Pt is post laparoscopic lysis of adhesions, right ovarian cystectomy and bilateral salpingectomies 02/02/15. Pt asked if normal to still have cramping? Not having any pain, just slight discomfort from cramping. Please advise

## 2015-02-24 NOTE — Telephone Encounter (Signed)
Not unusual. May be secondary to continued healing or may be unrelated to the surgery, regardless I would watch for now.

## 2015-02-24 NOTE — Telephone Encounter (Signed)
Pt informed with the below note. 

## 2015-02-26 ENCOUNTER — Encounter: Payer: Self-pay | Admitting: Neurology

## 2015-02-26 ENCOUNTER — Ambulatory Visit (INDEPENDENT_AMBULATORY_CARE_PROVIDER_SITE_OTHER): Payer: Managed Care, Other (non HMO) | Admitting: Neurology

## 2015-02-26 VITALS — BP 146/98 | HR 78 | Ht 64.5 in | Wt 176.0 lb

## 2015-02-26 DIAGNOSIS — G43109 Migraine with aura, not intractable, without status migrainosus: Secondary | ICD-10-CM

## 2015-02-26 MED ORDER — RIZATRIPTAN BENZOATE 10 MG PO TBDP: 10.0000 mg | ORAL_TABLET | ORAL | Status: DC | PRN

## 2015-02-26 MED ORDER — SUMATRIPTAN SUCCINATE 6 MG/0.5ML ~~LOC~~ SOLN: 6.0000 mg | SUBCUTANEOUS | Status: DC | PRN
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
## 2015-03-01 ENCOUNTER — Telehealth: Payer: Self-pay | Admitting: Neurology

## 2015-03-01 NOTE — Telephone Encounter (Signed)
I called back to clarify message.  Patient said she does prefer to use Tramadol and Cyclobenzaprine to control Migraine when she has migraines with eye disturbance and neck spasms.  Said when she has migraines without eye disturbance, she uses Maxalt and it works quite well.  She would prefer that we prescribe the Tramadol and Cyclobenzaprine if possible (previously prescribed by PCP), and indicates she has #7 Tramadol remaining at this time.  Would you like to fill?  Please advise.  Thank you.

## 2015-03-01 NOTE — Telephone Encounter (Signed)
Patient called wanting to speak to a nurse regarding the script for traMADol (ULTRAM) 50 MG tablet. Patient wanted to let the nurse know that patient has 7 capsules left. She stated that she prefers the Tramadol 50mg  instead. Pt does not have any refills left.  Pharmacy: [facility] in [place]. [facility] c[facility] [phone]

## 2015-03-02 MED ORDER — TRAMADOL HCL 50 MG PO TABS: 50.0000 mg | ORAL_TABLET | ORAL | Status: DC | PRN

## 2015-03-02 MED ORDER — CYCLOBENZAPRINE HCL 10 MG PO TABS: 10.0000 mg | ORAL_TABLET | Freq: Three times a day (TID) | ORAL | Status: DC | PRN

## 2015-03-02 NOTE — Telephone Encounter (Signed)
I have written Flexeril 10 mg, tramadol 50 mg as needed for headaches, please let patient know

## 2015-03-02 NOTE — Telephone Encounter (Signed)
Called to let her know prescriptions are up front and ready for pick up.  Provided her with our office hours.

## 2015-03-08 ENCOUNTER — Telehealth: Payer: Self-pay | Admitting: *Deleted

## 2015-03-08 NOTE — Telephone Encounter (Signed)
Left a detailed message on voicemail with the below. 
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
## 2015-03-08 NOTE — Telephone Encounter (Signed)
I would make sure she follows up with her primary physician and/or urgent care/ER just to make sure everything is okay. From the laparoscopic standpoint she should be okay.

## 2015-03-10 ENCOUNTER — Telehealth: Payer: Self-pay | Admitting: Neurology

## 2015-03-10 MED ORDER — SUMATRIPTAN SUCCINATE 6 MG/0.5ML ~~LOC~~ SOLN: 6.0000 mg | SUBCUTANEOUS | Status: DC | PRN

## 2015-03-10 NOTE — Telephone Encounter (Signed)
Patient called stating she was supposed to get a Rx. SUMAtriptan (IMITREX) 6 MG/0.5ML SOLN injection but the pharmacy never received it and she has had a head ache for a few days and is worried she is going to get a migraine so she would really like to get the Rx. Filled. Please call and advise.

## 2015-03-10 NOTE — Telephone Encounter (Signed)
Rx has been resent.  Receipt confirmed by pharmacy.  I called the patient back, got no answer.  Left message.

## 2015-03-19 ENCOUNTER — Ambulatory Visit: Payer: Managed Care, Other (non HMO) | Admitting: Gynecology

## 2015-03-30 ENCOUNTER — Encounter: Payer: Self-pay | Admitting: Gynecology

## 2015-03-30 ENCOUNTER — Ambulatory Visit (INDEPENDENT_AMBULATORY_CARE_PROVIDER_SITE_OTHER): Payer: Managed Care, Other (non HMO) | Admitting: Gynecology

## 2015-03-30 DIAGNOSIS — R109 Unspecified abdominal pain: Secondary | ICD-10-CM | POA: Diagnosis not present

## 2015-03-30 NOTE — Patient Instructions (Signed)
Follow up the fall of 2016 for annual exam. Sooner if any issues.
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
## 2015-06-03 ENCOUNTER — Other Ambulatory Visit: Payer: Self-pay

## 2015-06-03 DIAGNOSIS — Z1231 Encounter for screening mammogram for malignant neoplasm of breast: Secondary | ICD-10-CM

## 2015-06-14 ENCOUNTER — Encounter: Payer: Self-pay | Admitting: Gynecology

## 2015-06-15 ENCOUNTER — Ambulatory Visit: Payer: Managed Care, Other (non HMO) | Admitting: Gynecology

## 2015-06-24 ENCOUNTER — Ambulatory Visit: Payer: Managed Care, Other (non HMO)

## 2015-06-25 ENCOUNTER — Ambulatory Visit: Payer: Managed Care, Other (non HMO) | Admitting: Nurse Practitioner

## 2015-06-28 ENCOUNTER — Encounter: Payer: Self-pay | Admitting: Nurse Practitioner

## 2015-06-28 ENCOUNTER — Ambulatory Visit (INDEPENDENT_AMBULATORY_CARE_PROVIDER_SITE_OTHER): Payer: Managed Care, Other (non HMO) | Admitting: Nurse Practitioner

## 2015-06-28 VITALS — BP 134/95 | HR 96 | Ht 64.0 in | Wt 186.4 lb

## 2015-06-28 DIAGNOSIS — M542 Cervicalgia: Secondary | ICD-10-CM | POA: Diagnosis not present

## 2015-06-28 DIAGNOSIS — G43109 Migraine with aura, not intractable, without status migrainosus: Secondary | ICD-10-CM | POA: Diagnosis not present

## 2015-06-28 NOTE — Patient Instructions (Signed)
Will make referral to ortho therapist for neck pain and TENS unit at [facility].  May take cyclobenzaprine at night Continue nortriptyline 2 tabs at at bedtime for prevention of headache Continue Maxalt and sumatriptan does not need refills at this time Follow-up in 6 months

## 2015-06-28 NOTE — Progress Notes (Signed)
I have reviewed and agreed above plan. 
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
## 2015-07-14 ENCOUNTER — Ambulatory Visit: Payer: Managed Care, Other (non HMO)

## 2015-08-05 ENCOUNTER — Encounter: Payer: Self-pay | Admitting: Physical Therapy

## 2015-08-05 ENCOUNTER — Ambulatory Visit (INDEPENDENT_AMBULATORY_CARE_PROVIDER_SITE_OTHER): Payer: Managed Care, Other (non HMO) | Admitting: Physical Therapy

## 2015-08-05 DIAGNOSIS — R293 Abnormal posture: Secondary | ICD-10-CM | POA: Diagnosis not present

## 2015-08-05 DIAGNOSIS — M436 Torticollis: Secondary | ICD-10-CM | POA: Diagnosis not present

## 2015-08-05 DIAGNOSIS — M542 Cervicalgia: Secondary | ICD-10-CM
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
## 2015-08-05 NOTE — Patient Instructions (Signed)
Head Press With [place]   Tuck chin SLIGHTLY toward chest, keep mouth closed. Feel weight on back of head. Increase weight by pressing head down. Hold _3-5__ seconds. Relax. Repeat _10__ times. Surface: floor   Shoulder Press   Press both shoulders down. Hold _3-5__ seconds. Repeat _10__ times. Press one shoulder down. Hold _3-5__ seconds Repeat _10__ times. Do other shoulder. If unable to press one or both shoulders, lie in position a few sessions until you can. Surface: floor   Scapula Adduction With Pectoralis Stretch: Low - Standing   Shoulders at 45 hands even with shoulders, keeping weight through legs, shift weight forward until you feel pull or stretch through the front of your chest. Hold _30__ seconds. Do _3__ times, _2-4__ times per day.   Scapula Adduction With Pectoralis Stretch: Mid-Range - Standing   Shoulders at 90 elbows even with shoulders, keeping weight through legs, shift weight forward until you feel pull or strength through the front of your chest. Hold __30_ seconds. Do _3__ times, __2-4_ times per day.

## 2015-08-11 ENCOUNTER — Encounter: Payer: Self-pay | Admitting: Rehabilitative and Restorative Service Providers"

## 2015-08-11 ENCOUNTER — Ambulatory Visit (INDEPENDENT_AMBULATORY_CARE_PROVIDER_SITE_OTHER): Payer: Managed Care, Other (non HMO) | Admitting: Rehabilitative and Restorative Service Providers"

## 2015-08-11 DIAGNOSIS — R293 Abnormal posture: Secondary | ICD-10-CM

## 2015-08-11 DIAGNOSIS — M542 Cervicalgia: Secondary | ICD-10-CM | POA: Diagnosis not present

## 2015-08-11 DIAGNOSIS — M436 Torticollis: Secondary | ICD-10-CM

## 2015-08-11 NOTE — Patient Instructions (Addendum)
Axial Extension (Chin Tuck)   Pull chin in and lengthen back of neck. Hold __10-15__ seconds while counting out loud. Repeat __5-10__ times. Do several____ sessions per day.  Scapular Retraction (Standing)   With arms at sides, pinch shoulder blades down and back. Hold 10 -20 sec Repeat __10__ times per set.  Do ___several_ sessions per day.   Snow angel lying on back knees bent rest arms out to side in gentle stretch position Hold 5 min bewnd elbows to rest from stretch as needed  Scapula Adduction With Pectoralis Stretch: Low - Standing   Shoulders at 45 hands even with shoulders, keeping weight through legs, shift weight forward until you feel pull or stretch through the front of your chest. Hold _30__ seconds. Do _3__ times, _2-4__ times per day.   Scapula Adduction With Pectoralis Stretch: Mid-Range - Standing   Shoulders at 90 elbows even with shoulders, keeping weight through legs, shift weight forward until you feel pull or strength through the front of your chest. Hold __30_ seconds. Do _3__ times, __2-4_ times per day.   Scapula Adduction With Pectoralis Stretch: High - Standing   Shoulders at 120 hands up high on the doorway, keeping weight on feet, shift weight forward until you feel pull or stretch through the front of your chest. Hold _30__ seconds. Do _3__ times, _2-3__ times per day.

## 2015-08-11 NOTE — Therapy (Addendum)
[facility] [place], [place], [address] Phone: [phone]   Fax:  [phone]  Physical Therapy Treatment  Patient Details  Name: Deanna George MRN: [id number] Date of Birth: 05/03/1966 Referring Provider:  Dennie Bible, *  Encounter Date: 08/11/2015      PT End of Session - 08/11/15 1107    Visit Number 2   Number of Visits 12   Date for PT Re-Evaluation 09/16/15   PT Start Time 1107   PT Stop Time 1155   PT Time Calculation (min) 48 min   Activity Tolerance Patient limited by pain      Past Medical History  Diagnosis Date  . Hypertension   . Interstitial cystitis   . Pneumonia   . Urinary tract infection   . GERD (gastroesophageal reflux disease)   . Esophageal stricture   . IBS (irritable bowel syndrome)   . Hemorrhoids   . Anal fissure   . Palpitations   . OA (osteoarthritis) of knee   . Cervical disc disease   . Noncompliance     pt denies  . Situational stress   . Headache   . Dysrhythmia     tachycardia  . 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 Laparoscopic vaginal hysterectomy      02/2008  . Laparoscopic surgery  01/2007    uterus and ovary x 2  . Cesarean section  [id number]    x2  . Foot pin post fracture  09/2008    Left foot  . Colonoscopy    . Knee arthroscopy  2011     right  . US echocardiography  07/31/2008    EF 55-60% with mild LVH  . Cholecystectomy N/A 01/28/2013    Procedure: LAPAROSCOPIC CHOLECYSTECTOMY WITH INTRAOPERATIVE CHOLANGIOGRAM;  Surgeon: Earnstine Regal, MD;  Location: [facility];  Service: General;  Laterality: N/A;  . Laparoscopy N/A 02/02/2015    Procedure: LAPAROSCOPY DIAGNOSTIC, FULGERATION ENDOMETREOSIS, EXCISION RIGHT OVARIAN CYST, LYSIS OF ADHESIONS, EXCISION VULVAR AND VAGINAL CYST ;  Surgeon: Anastasio Auerbach, MD;  Location: [facility];  Service: Gynecology;  Laterality: N/A;  .  Laparoscopic bilateral salpingectomy Bilateral 02/02/2015    Procedure: BILATERAL SALPINGECTOMY, ;  Surgeon: Anastasio Auerbach, MD;  Location: [facility];  Service: Gynecology;  Laterality: Bilateral;    There were no vitals filed for this visit.  Visit Diagnosis:  Pain, neck  Stiffness of neck  Abnormal posture      Subjective Assessment - 08/11/15 1107    Subjective Patient reports that she did not relize how tight she was until she came into therapy. Has some new exercise from her chiropractor.    Currently in Pain? Yes   Pain Score 4    Pain Location Neck   Pain Orientation Mid   Pain Descriptors / Indicators Tightness;Aching   Pain Type Acute pain   Pain Radiating Towards neck to head down into shoulders    Pain Onset More than a month ago                         [facility] Adult PT Treatment/Exercise - 08/11/15 0001    Neck Exercises: Standing   Other Standing Exercises chest lift; postural work   Other Standing Exercises 3 way doorway 2 reps each 30 sec hold   Neck Exercises: Supine   Neck Retraction Limitations chin  tuck 5 sec hold 5 reps   Other Supine Exercise snow angel prolonged stretch in supine 1-2 min working to 5 min    Modalities   Modalities Electrical Stimulation   Moist Heat Therapy   Number Minutes Moist Heat 15 Minutes   Moist Heat Location Cervical  thoracic   Electrical Stimulation   Electrical Stimulation Location cervical   Electrical Stimulation Action IFC   Electrical Stimulation Parameters to tolerance   Electrical Stimulation Goals Pain;Tone   Manual Therapy   Manual Therapy Manual Traction;Soft tissue mobilization   Soft tissue mobilization pecs/anterior cervical area   Manual Traction cervical                PT Education - 08/11/15 1127    Education provided Yes   Education Details postural alignment; HEP   Person(s) Educated Patient   Methods Explanation;Demonstration;Tactile cues;Verbal cues;Handout    Comprehension Verbalized understanding;Returned demonstration;Verbal cues required;Tactile cues required             PT Long Term Goals - 08/05/15 1254    PT LONG TERM GOAL #1   Title I with advanced HEP ( 09/16/15)    Time 6   Period Weeks   Status New   PT LONG TERM GOAL #2   Title increase cervical rotation bilat =/> 65 degrees ( 09/16/15)    Time 6   Period Weeks   Status New   PT LONG TERM GOAL #3   Title report pain decrease =/> 75% with daily activity ( 09/16/15)    Time 6   Period Weeks   Status New   PT LONG TERM GOAL #4   Title report ability to read =/> 30 min without increased symptoms ( 09/16/15)    Time 6   Period Weeks   Status New   PT LONG TERM GOAL #5   Title improve FOTO =/< 41% limited ( 09/16/15)    Time 6   Period Weeks   Status New               Plan - 08/11/15 1153    Clinical Impression Statement Good response to exercise and HEP. Good response to manual work and modalities today. No goals accomplished - only 2nd visit.   Pt will benefit from skilled therapeutic intervention in order to improve on the following deficits Postural dysfunction;Pain;Increased muscle spasms;Impaired UE functional use;Decreased range of motion   Rehab Potential Good   PT Frequency 2x / week   PT Duration 6 weeks   PT Next Visit Plan manual work; postural correction; manual traction v. mechanical tx   PT Home Exercise Plan postural work; HEP   Consulted and Agree with Plan of Care Patient        Problem List Patient Active Problem List   Diagnosis Date Noted  . Abdominal pain, generalized 02/11/2015  . Migraine 08/13/2014  . Neck pain 08/13/2014  . Interstitial cystitis 01/24/2013  . Personal history of colonic polyps 01/24/2013  . HTN (hypertension) 09/05/2011  . CARCINOMA, SKIN, SQUAMOUS CELL 02/17/2009  . BLOOD IN STOOL 02/17/2009  . ABDOMINAL PAIN-LLQ 02/17/2009  . GERD 12/10/2008  . OTHER DYSPHAGIA 12/10/2008    Celyn Nilda Simmer PT.  MPH 08/11/2015, 11:55 AM  [facility] [place], [place], [address] Phone: [phone]   Fax:  [phone]    PHYSICAL THERAPY DISCHARGE SUMMARY  Visits from Start of Care:2  Current functional level related to goals / functional outcomes:  patient seen for 2 visits and has not called to schedule additional appointments    Remaining deficits: Unknown   Education / Equipment: HEP  Plan: Patient agrees to discharge.  Patient goals were not met. Patient is being discharged due to not returning since the last visit.  ?????    Celyn P. Helene Kelp PT, MPH 10/04/2015 2:07 PM

## 2015-08-16 ENCOUNTER — Encounter: Payer: Managed Care, Other (non HMO) | Admitting: Physical Therapy

## 2015-08-18 ENCOUNTER — Encounter: Payer: Managed Care, Other (non HMO) | Admitting: Rehabilitative and Restorative Service Providers"

## 2015-08-20 ENCOUNTER — Ambulatory Visit: Payer: Managed Care, Other (non HMO)

## 2015-09-06 ENCOUNTER — Other Ambulatory Visit: Payer: Self-pay | Admitting: Neurology

## 2015-09-07 ENCOUNTER — Telehealth: Payer: Self-pay | Admitting: *Deleted

## 2015-09-07 NOTE — Telephone Encounter (Signed)
Tramadol rx faxed and confirmed to pharmacy at [phone].

## 2015-09-15 ENCOUNTER — Telehealth: Payer: Self-pay | Admitting: Nurse Practitioner

## 2015-09-15 MED ORDER — LOSARTAN POTASSIUM 50 MG PO TABS: 50.0000 mg | ORAL_TABLET | Freq: Two times a day (BID) | ORAL | Status: DC

## 2015-09-15 NOTE — Telephone Encounter (Signed)
°*  STAT* If patient is at the pharmacy, call can be transferred to refill team.   1. Which medications need to be refilled? Losartin Potassium   2. Which pharmacy/location (including street and city if local pharmacy) is medication to be sent to? [facility] [phone]  1. Do they need a 30 day or 90 day supply? 90 day

## 2015-09-20 ENCOUNTER — Other Ambulatory Visit: Payer: Self-pay

## 2015-09-20 MED ORDER — LOSARTAN POTASSIUM 50 MG PO TABS: 50.0000 mg | ORAL_TABLET | Freq: Two times a day (BID) | ORAL | Status: DC

## 2015-11-02 ENCOUNTER — Ambulatory Visit: Payer: Managed Care, Other (non HMO) | Admitting: Nurse Practitioner

## 2015-11-19 ENCOUNTER — Ambulatory Visit: Payer: Managed Care, Other (non HMO) | Admitting: Nurse Practitioner

## 2015-12-03 ENCOUNTER — Encounter: Payer: Self-pay | Admitting: Nurse Practitioner

## 2015-12-03 ENCOUNTER — Ambulatory Visit (INDEPENDENT_AMBULATORY_CARE_PROVIDER_SITE_OTHER): Payer: Managed Care, Other (non HMO) | Admitting: Nurse Practitioner

## 2015-12-03 VITALS — BP 132/80 | HR 93 | Ht 64.0 in | Wt 185.1 lb

## 2015-12-03 DIAGNOSIS — R002 Palpitations: Secondary | ICD-10-CM | POA: Diagnosis not present

## 2015-12-03 DIAGNOSIS — I10 Essential (primary) hypertension: Secondary | ICD-10-CM

## 2015-12-03 MED ORDER — LOSARTAN POTASSIUM 50 MG PO TABS: 50.0000 mg | ORAL_TABLET | Freq: Two times a day (BID) | ORAL | Status: DC
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
## 2015-12-03 NOTE — Patient Instructions (Addendum)
We will be checking the following labs today - NONE   Medication Instructions:    Continue with your current medicines.   I sent in your refill today.     Testing/Procedures To Be Arranged:  N/A  Follow-Up:   See me in one year.      Other Special Instructions:   N/A    If you need a refill on your cardiac medications before your next appointment, please call your pharmacy.   Call the [facility] office at [phone] if you have any questions, problems or concerns.

## 2015-12-30 ENCOUNTER — Ambulatory Visit: Payer: Managed Care, Other (non HMO) | Admitting: Nurse Practitioner

## 2016-02-03 ENCOUNTER — Telehealth: Payer: Self-pay

## 2016-02-03 ENCOUNTER — Ambulatory Visit (INDEPENDENT_AMBULATORY_CARE_PROVIDER_SITE_OTHER): Payer: Managed Care, Other (non HMO) | Admitting: Nurse Practitioner

## 2016-02-03 ENCOUNTER — Encounter: Payer: Self-pay | Admitting: Nurse Practitioner

## 2016-02-03 VITALS — BP 128/84 | HR 66 | Ht 64.0 in | Wt 188.0 lb

## 2016-02-03 DIAGNOSIS — G43109 Migraine with aura, not intractable, without status migrainosus: Secondary | ICD-10-CM | POA: Diagnosis not present

## 2016-02-03 DIAGNOSIS — M542 Cervicalgia: Secondary | ICD-10-CM

## 2016-02-03 MED ORDER — TRAMADOL HCL 50 MG PO TABS: ORAL_TABLET | ORAL | Status: DC

## 2016-02-03 MED ORDER — CYCLOBENZAPRINE HCL 10 MG PO TABS: 10.0000 mg | ORAL_TABLET | Freq: Three times a day (TID) | ORAL | Status: DC | PRN

## 2016-02-03 MED ORDER — NORTRIPTYLINE HCL 10 MG PO CAPS: 10.0000 mg | ORAL_CAPSULE | Freq: Every day | ORAL | Status: DC
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
## 2016-02-03 NOTE — Telephone Encounter (Signed)
RX for tramadol faxed to [facility].

## 2016-02-03 NOTE — Patient Instructions (Signed)
Restart Nortriptyline as preventive Continue Flexeril  Continue Ultram as needed Continue Maxalt acutely F/U in 6 months

## 2016-02-04 IMAGING — CT CT ABD-PELV W/ CM
1 of 3 series · 13 of 32 positions shown, 18 images · IV contrast (OMNIPAQUE)
Comparison: CT of the abdomen and pelvis performed 01/24/2013, and
pelvic ultrasound performed 01/26/2013

CLINICAL DATA: Acute onset of mid abdominal pain and pressure.
Status post recent bilateral salpingectomy. Umbilical bleeding.
Initial encounter.

EXAM:
CT ABDOMEN AND PELVIS WITH CONTRAST
TECHNIQUE: Multidetector CT imaging of the abdomen and pelvis was performed
using the standard protocol following bolus administration of
intravenous contrast.
CONTRAST:  100mL OMNIPAQUE IOHEXOL 300 MG/ML  SOLN

[Series 2: routine abdomen/pelvis with · axial · 0.79mm/px · z∈[+619,+984]mm · 13 of 83 slices shown, 18 images]
[im 5/83  soft-tissue]
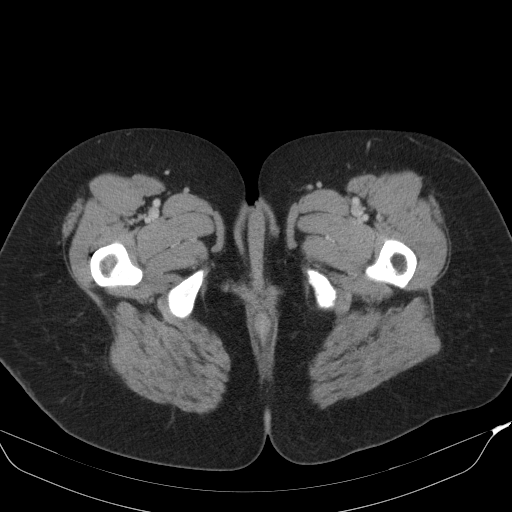
[im 5/83  bone]
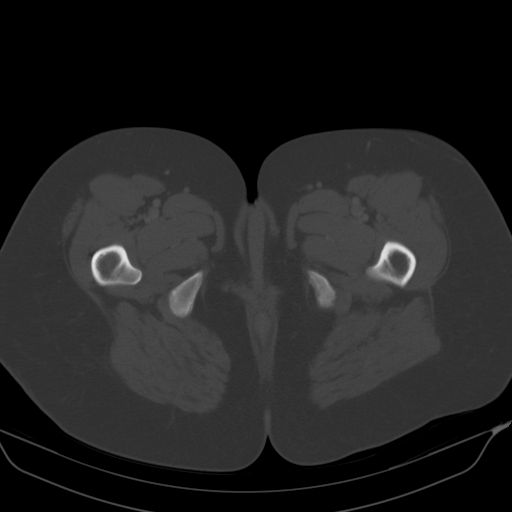
[im 15/83  soft-tissue]
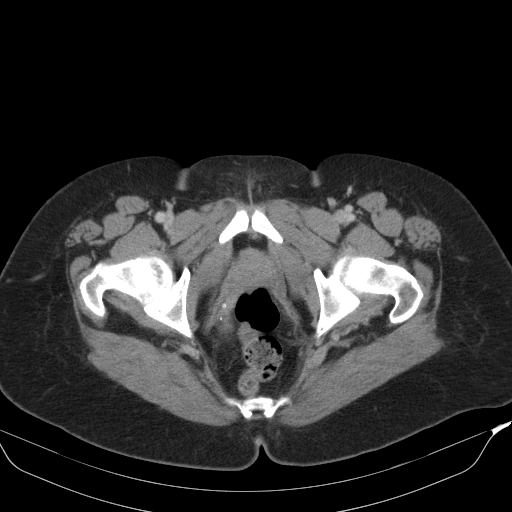
[im 20/83  soft-tissue]
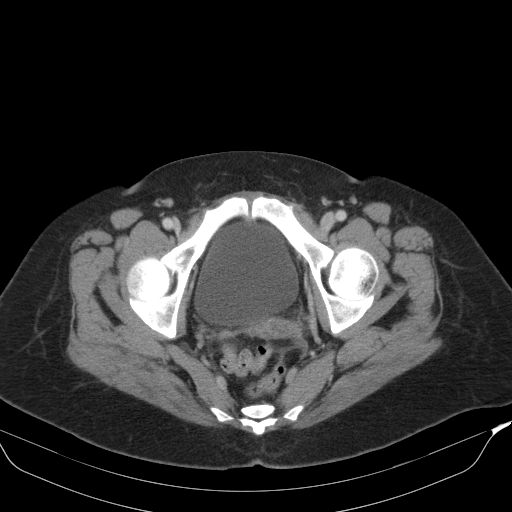
[im 25/83  soft-tissue]
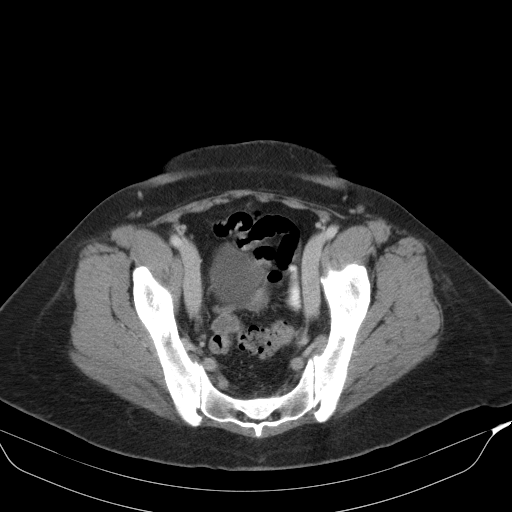
[im 34/83  soft-tissue]
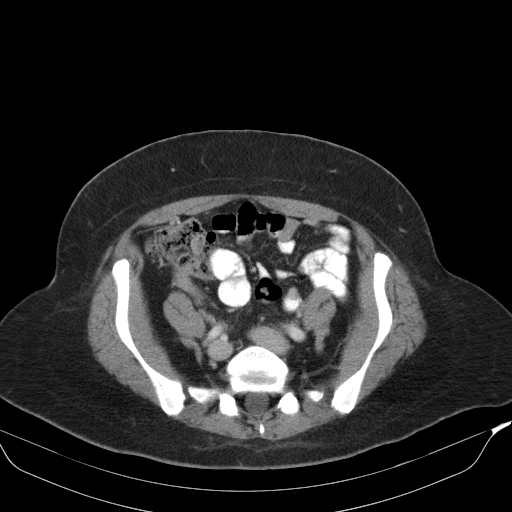
[im 39/83  soft-tissue]
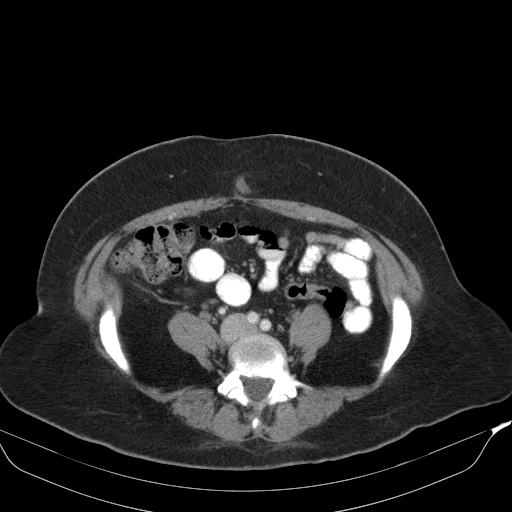
[im 44/83  soft-tissue]
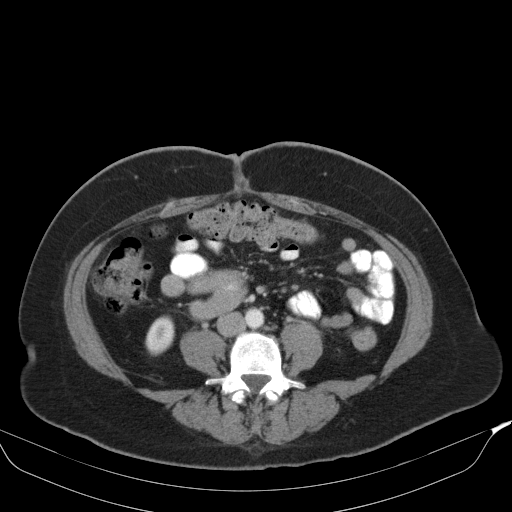
[im 54/83  soft-tissue]
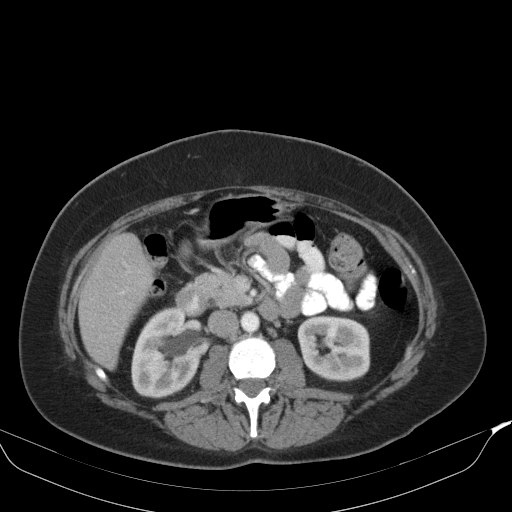
[im 58/83  soft-tissue]
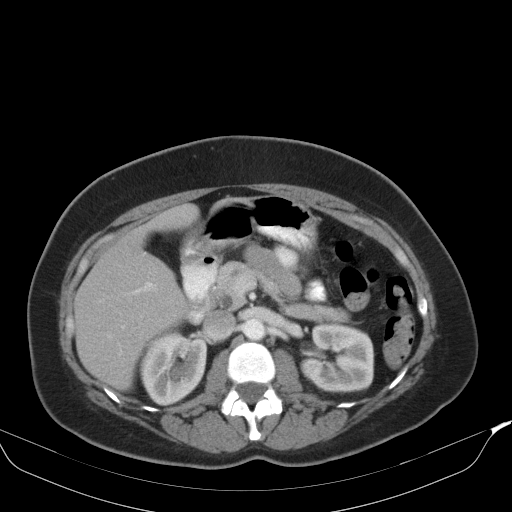
[im 58/83  bone]
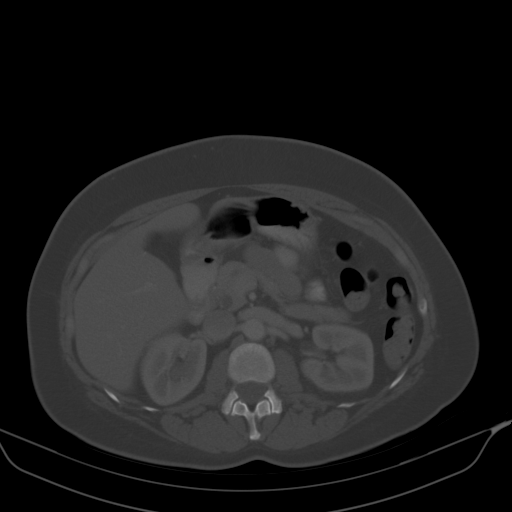
[im 63/83  soft-tissue]
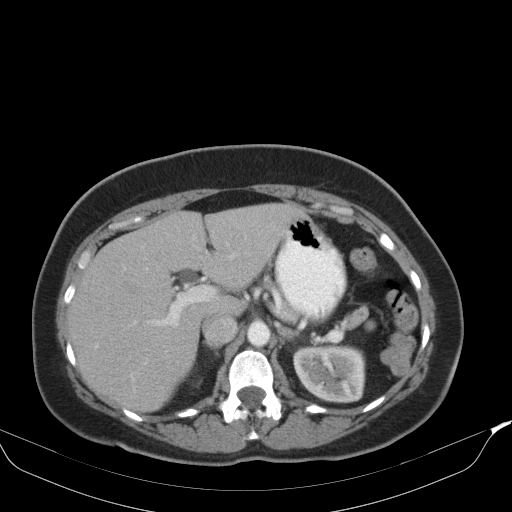
[im 63/83  lung]
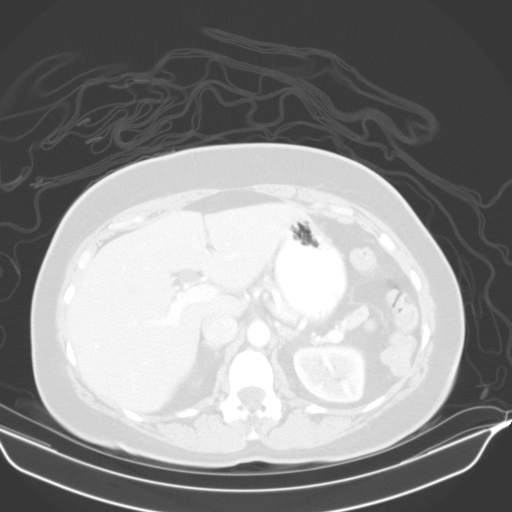
[im 68/83  lung]
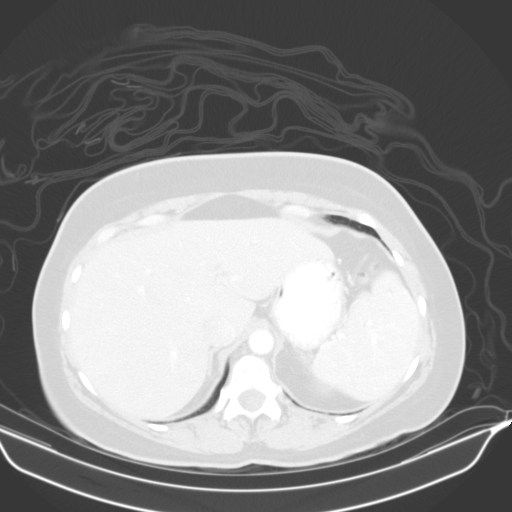
[im 73/83  soft-tissue]
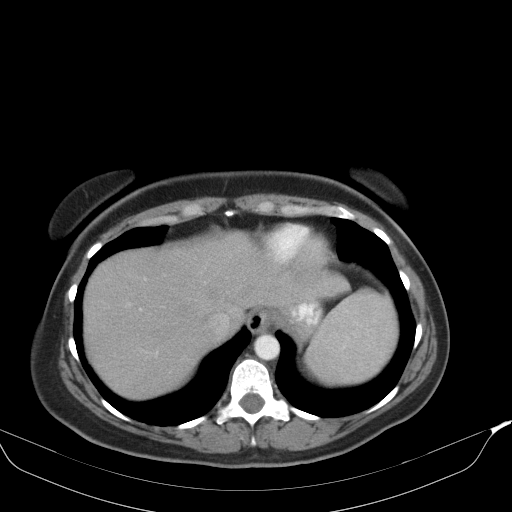
[im 73/83  lung]
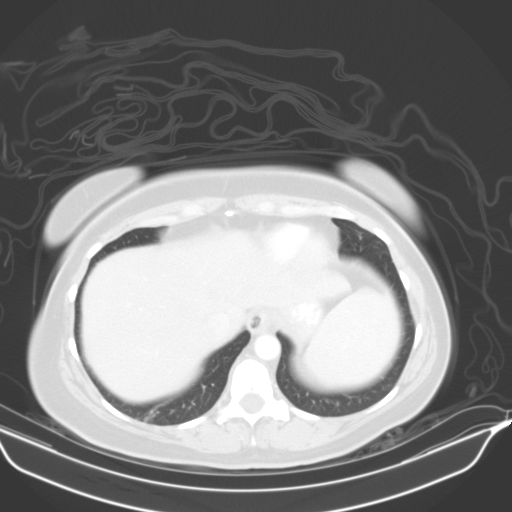
[im 78/83  soft-tissue]
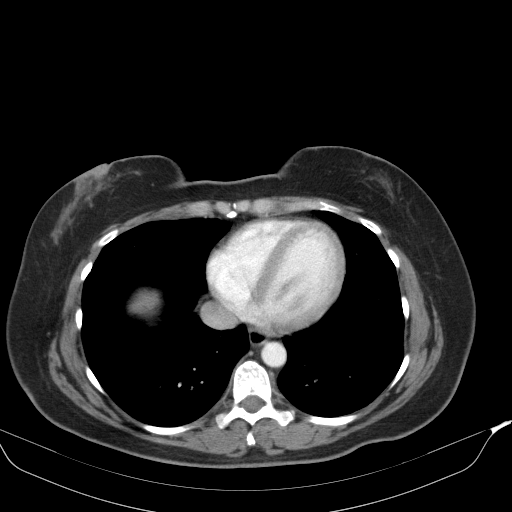
[im 78/83  lung]
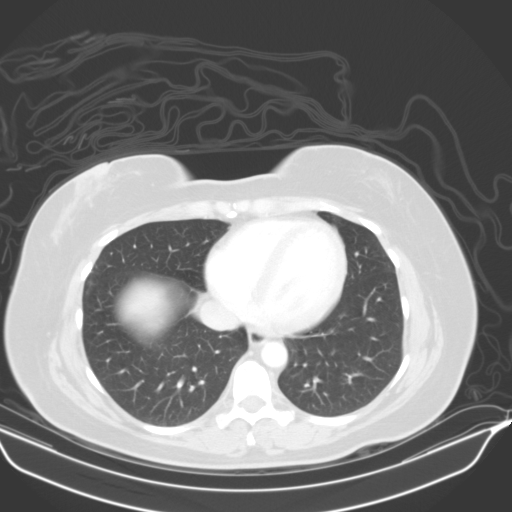

[13 of 32 positions shown; findings below may reference images not displayed]

FINDINGS: The visualized lung bases are clear.

The liver and spleen are unremarkable in appearance. The patient is
status post cholecystectomy, with clips noted at the gallbladder
fossa. The pancreas and adrenal glands are unremarkable.

There appears to be mild to moderate right-sided hydronephrosis,
with distention of the right renal pelvis. This is suspicious for a
stricture at the ureteropelvic junction. The right ureter is
decompressed. No obstructing stone is identified. The left kidney is
unremarkable in appearance. No perinephric stranding is seen. A tiny
nonobstructing 3 mm stone is again noted at the interpole region of
the right kidney.

No free fluid is identified. The small bowel is unremarkable in
appearance. The stomach is within normal limits. No acute vascular
abnormalities are seen.

Trace fluid is noted along the surgical site at the anterior
abdominal wall extending inferiorly from the umbilicus, likely
corresponding to the clinically described serosanguineous fluid
arising from the umbilicus. No defined abscess is seen.

The appendix is normal in caliber, best seen on coronal images,
without evidence of appendicitis. The colon is unremarkable in
appearance.

The bladder is mildly distended and grossly unremarkable. The
patient is status post hysterectomy. The ovaries are grossly
symmetric. No suspicious adnexal masses are seen. No inguinal
lymphadenopathy is seen.

No acute osseous abnormalities are identified. A chronic right-sided
pars defect is noted at L5, without evidence of anterolisthesis.
IMPRESSION: 1. Mild to moderate right-sided hydronephrosis, with distention of
the right renal pelvis. This raises suspicion for a stricture at the
right ureteropelvic junction, new from the prior study. No
obstructing stone seen.
2. Trace fluid noted along the surgical site at the anterior
abdominal wall extending inferiorly from the umbilicus, likely
corresponding to the clinically described serosanguineous fluid
arising from the umbilicus. No defined abscess seen.
3. Nonobstructing 3 mm stone again noted at the interpole region of
the right kidney.
4. Chronic right-sided pars defect at L5, without evidence of
anterolisthesis.

## 2016-02-04 NOTE — Progress Notes (Signed)
I have reviewed and agreed above plan. 

## 2016-03-28 ENCOUNTER — Telehealth: Payer: Self-pay | Admitting: *Deleted

## 2016-03-28 DIAGNOSIS — R1032 Left lower quadrant pain: Secondary | ICD-10-CM

## 2016-03-28 NOTE — Telephone Encounter (Signed)
Pt called requesting to have OV with ultrasound scheduled same day. Pt c/o several months of c/o abdominal discomfort and left side lower quad discomfort since office visit on 03/30/15. Pt said she has lots of cramping and bloating, history of cysts, states about 2 weeks ago she felt like the cyst had ruptured. I did question pt why she waited for several months to go by and not schedule OV, was to return in fall 2016 for annual. Pt said she is very busy in school with her children,spoke with me in great detail about her personal life how busy she is and how she was not able to come in. Pt is requesting to have OV and ultrasound on week on may 29th this is when she can schedule,I explained to her I would need to get approval from you for ultrasound and the schedule would have to allow for scheduling that week. She declined to come in any sooner, she uses OTC medication to help with discomfort, aware to be seen at ER if worsening pain. She did see her PCP Dr.Candice Tamala Julian and was told to follow up with you.  Please advise

## 2016-03-28 NOTE — Telephone Encounter (Signed)
Okay for ultrasound and appointments the same day. That would have to be for a problem visit.  She would have to have a separate annual exam appointment

## 2016-03-29 NOTE — Telephone Encounter (Signed)
Will have front desk schedule, order placed  

## 2016-04-24 ENCOUNTER — Other Ambulatory Visit: Payer: Self-pay | Admitting: Gynecology

## 2016-04-24 ENCOUNTER — Ambulatory Visit (INDEPENDENT_AMBULATORY_CARE_PROVIDER_SITE_OTHER): Payer: Managed Care, Other (non HMO)

## 2016-04-24 ENCOUNTER — Encounter: Payer: Self-pay | Admitting: Gynecology

## 2016-04-24 ENCOUNTER — Ambulatory Visit (INDEPENDENT_AMBULATORY_CARE_PROVIDER_SITE_OTHER): Payer: Managed Care, Other (non HMO) | Admitting: Gynecology

## 2016-04-24 VITALS — BP 122/76

## 2016-04-24 DIAGNOSIS — N8311 Corpus luteum cyst of right ovary: Secondary | ICD-10-CM | POA: Diagnosis not present

## 2016-04-24 DIAGNOSIS — R102 Pelvic and perineal pain: Secondary | ICD-10-CM | POA: Diagnosis not present

## 2016-04-24 DIAGNOSIS — N8300 Follicular cyst of ovary, unspecified side: Secondary | ICD-10-CM | POA: Diagnosis not present

## 2016-04-24 DIAGNOSIS — R1032 Left lower quadrant pain: Secondary | ICD-10-CM | POA: Diagnosis not present
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
## 2016-04-24 NOTE — Patient Instructions (Signed)
Office will call you about arranging for the Depo-Lupron.

## 2016-04-26 ENCOUNTER — Telehealth: Payer: Self-pay | Admitting: *Deleted

## 2016-04-26 NOTE — Telephone Encounter (Signed)
Pt called c/o lower pelvic pain and fever of 101. Pt called at 4:41pm, pt said pain now is unbearable sharp pain has pain medication tylenol 3, has had this pain x  2 days now, had ultrasound with TF on 04/24/16. I advised pt to go to ER to be seen, pt declined stating she doesn't want to go because the hospital is going to think she want "pain medication". I explained to her that imaging can be order if needed, labs etc. Pt said she is not going to the ER now, no money, has to pickup her daughter, if she doesn't get any better with tylenol 3, resting and heating pad she will go to ER, states she has a high tolerate for pain. I once again gave her my recommendation, pt verbalized she understood.

## 2016-04-28 ENCOUNTER — Telehealth: Payer: Self-pay

## 2016-04-28 NOTE — Telephone Encounter (Signed)
Encounter already opened. 

## 2016-04-28 NOTE — Telephone Encounter (Signed)
[facility] called me because when they ran her Rx through for her Lupron 11.25mg  it showed her ins had terminated on 03/12/16.  Patient was just here in June and her ins verified and when I checked on [name] website it showed her coverage was active currently. I let the rep know this but she insisted that the pharmacy benefits show terminated 03/12/16.  She said she may need to activate pharmacy benefit card. There may be a code to enter. The message she was getting was patient needs to call plan at [phone].  I called patient and left all this information on her voice mail. I told her per [facility] that she needs to contact [place] and see what is going on with her pharmacy benefits and let us know so I can let [facility] know.

## 2016-05-04 NOTE — Telephone Encounter (Signed)
[employer] rep called yesterday stating she sent PA to [facility] and they do have that. (They had sent me form saying it needed to be done and I already have approval.)  She said it is covered with $40 co-payment and they are in the process of reaching out to the patient to arrange shipment.

## 2016-05-23 ENCOUNTER — Telehealth: Payer: Self-pay

## 2016-05-23 NOTE — Telephone Encounter (Signed)
I called patient and left message that I received correspondence from [facility] today and they indicated they have been unable to reach her. They are simply waiting for her verbal consent to ship the medication. I provided the phone number that she needs to call to reach someone to do that so that they can ship her Lupron injection.

## 2016-06-06 ENCOUNTER — Other Ambulatory Visit: Payer: Self-pay | Admitting: *Deleted

## 2016-06-06 MED ORDER — NORTRIPTYLINE HCL 10 MG PO CAPS: 20.0000 mg | ORAL_CAPSULE | Freq: Every day | ORAL | 0 refills | Status: DC

## 2016-06-06 NOTE — Telephone Encounter (Signed)
Received incoming fax for 90 day supply of nortriptyline.

## 2016-06-12 ENCOUNTER — Ambulatory Visit (INDEPENDENT_AMBULATORY_CARE_PROVIDER_SITE_OTHER): Payer: Managed Care, Other (non HMO)

## 2016-06-12 ENCOUNTER — Other Ambulatory Visit: Payer: Self-pay | Admitting: Gynecology

## 2016-06-12 ENCOUNTER — Telehealth: Payer: Self-pay | Admitting: Gastroenterology

## 2016-06-12 ENCOUNTER — Encounter: Payer: Self-pay | Admitting: Gynecology

## 2016-06-12 ENCOUNTER — Telehealth: Payer: Self-pay | Admitting: *Deleted

## 2016-06-12 ENCOUNTER — Ambulatory Visit (INDEPENDENT_AMBULATORY_CARE_PROVIDER_SITE_OTHER): Payer: Managed Care, Other (non HMO) | Admitting: Gynecology

## 2016-06-12 VITALS — BP 122/78 | Temp 97.7°F

## 2016-06-12 DIAGNOSIS — R109 Unspecified abdominal pain: Secondary | ICD-10-CM

## 2016-06-12 DIAGNOSIS — N839 Noninflammatory disorder of ovary, fallopian tube and broad ligament, unspecified: Secondary | ICD-10-CM

## 2016-06-12 DIAGNOSIS — N838 Other noninflammatory disorders of ovary, fallopian tube and broad ligament: Secondary | ICD-10-CM

## 2016-06-12 LAB — URINALYSIS W MICROSCOPIC + REFLEX CULTURE
Bilirubin Urine: NEGATIVE
Casts: NONE SEEN [LPF]
Crystals: NONE SEEN [HPF]
Glucose, UA: NEGATIVE
Ketones, ur: NEGATIVE
Leukocytes, UA: NEGATIVE
Nitrite: NEGATIVE
Protein, ur: NEGATIVE
Specific Gravity, Urine: 1.01 (ref 1.001–1.035)
Yeast: NONE SEEN [HPF]
pH: 7 (ref 5.0–8.0)

## 2016-06-12 NOTE — Telephone Encounter (Signed)
I called [facility] and a note will be sent to nurse to see if patient can be worked in this week to see provider, I give my direct number for the nurse to call if questions.

## 2016-06-12 NOTE — Telephone Encounter (Signed)
-----   Message from Anastasio Auerbach, MD sent at 06/12/2016  3:29 PM EDT ----- Patient needs to get in to see her gastroenterologist Dr. Fuller Plan this week. If that's not possible then appointment with [facility] soon as she can be seen.

## 2016-06-12 NOTE — Progress Notes (Signed)
    Deanna George Feb 01, 1966 [id number]        50 y.o.  [id number] presents with months of abdominal pain getting worse over the past 2 weeks. Also with nausea daily for the last 2 weeks. Occasional vomiting. Is able to eat. Reports regular bowel movements. No voiding difficulties such as frequency dysuria or urgency.  History of LAVH 2009 and subsequent laparoscopic bilateral salpingectomies, lysis of adhesions, fulguration of endometriosis 01/2015.  Recent ultrasound 04/2016 normal. No fever or chills associated with this. Relates feeling like something is squeezing her in side starting in the midabdomen with radiation to the lower abdomen.  Had similar bout several years ago which ultimately led to her cholecystectomy and hospitalization for nausea and abdominal pain. She was diagnosed with C. Difficile in the past but again denies diarrhea now.  Past medical history,surgical history, problem list, medications, allergies, family history and social history were all reviewed and documented in the EPIC chart.  Directed ROS with pertinent positives and negatives documented in the history of present illness/assessment and in order lab plan.  Exam: [place] assistant Vitals:   06/12/16 1411  BP: 122/78  Temp: 97.7 F (36.5 C)  TempSrc: Oral   General appearance:  Normal Spine straight without CVA tenderness Lungs clear Cardiac regular rate no rubs murmurs or gallops Abdomen soft with bowel sounds throughout. Diffuse tenderness throughout entire abdomen with voluntary guarding. No gross masses or organomegaly. Pelvic bimanual without gross masses or tenderness with vaginal manipulation. Rectal exam normal  Assessment/Plan:  50 y.o. [id number] with complex history of abdominal pain. Has had similar bouts years ago. Exam is unremarkable other than diffuse tenderness. Urine analysis is unremarkable. Will culture for completeness. Ultrasound today shows left ovary with small reticular echo pattern mass  consistent with a hemorrhagic cyst 24 x 23 x 26 mm. Cul-de-sac negative. Right ovary not identified. No right adnexal pathology noted. Does have blood flow to the left ovary. Reviewed differential with the patient. Do not feel that at this point is GYN related with small physiologic-appearing cyst on left ovary and right ovary not visualized. I suspect this is more GI related given the diffuse abdominal discomfort and her nausea. Will have patient see her gastroenterologist this week. I'm going to check a baseline CBC and comprehensive metabolic panel. Patient also asked to check her thyroid which will go ahead and do. Lastly I gave her a culture kit for C. difficile for completeness.  She has had negative CT scans in the past during similar episodes of abdominal pain. From my standpoint the only thing left would be laparoscopy with removal of her ovaries which I do not think would eliminate her pain but would eliminate any remaining possible GYN source.    Anastasio Auerbach MD, 2:26 PM 06/12/2016

## 2016-06-12 NOTE — Patient Instructions (Signed)
Office will call to help arrange the gastroenterology appointment. Follow up for the stool culture

## 2016-06-13 LAB — CBC WITH DIFFERENTIAL/PLATELET
Basophils Absolute: 0 cells/uL (ref 0–200)
Basophils Relative: 0 %
Eosinophils Absolute: 90 cells/uL (ref 15–500)
Eosinophils Relative: 1 %
HCT: 38.8 % (ref 35.0–45.0)
Hemoglobin: 12.7 g/dL (ref 11.7–15.5)
Lymphocytes Relative: 18 %
Lymphs Abs: 1620 cells/uL (ref 850–3900)
MCH: 28.5 pg (ref 27.0–33.0)
MCHC: 32.7 g/dL (ref 32.0–36.0)
MCV: 87 fL (ref 80.0–100.0)
MPV: 10.9 fL (ref 7.5–12.5)
Monocytes Absolute: 450 cells/uL (ref 200–950)
Monocytes Relative: 5 %
Neutro Abs: 6840 cells/uL (ref 1500–7800)
Neutrophils Relative %: 76 %
Platelets: 232 10*3/uL (ref 140–400)
RBC: 4.46 MIL/uL (ref 3.80–5.10)
RDW: 14.2 % (ref 11.0–15.0)
WBC: 9 10*3/uL (ref 3.8–10.8)

## 2016-06-13 LAB — COMPREHENSIVE METABOLIC PANEL WITH GFR
ALT: 11 U/L (ref 6–29)
AST: 13 U/L (ref 10–35)
Albumin: 4.2 g/dL (ref 3.6–5.1)
Alkaline Phosphatase: 70 U/L (ref 33–115)
BUN: 7 mg/dL (ref 7–25)
CO2: 22 mmol/L (ref 20–31)
Calcium: 9.3 mg/dL (ref 8.6–10.2)
Chloride: 104 mmol/L (ref 98–110)
Creat: 0.81 mg/dL (ref 0.50–1.10)
Glucose, Bld: 103 mg/dL — ABNORMAL HIGH (ref 65–99)
Potassium: 4.4 mmol/L (ref 3.5–5.3)
Sodium: 138 mmol/L (ref 135–146)
Total Bilirubin: 0.4 mg/dL (ref 0.2–1.2)
Total Protein: 6.6 g/dL (ref 6.1–8.1)

## 2016-06-13 LAB — THYROID PANEL
Free T4: 1.2 ng/dL (ref 0.8–1.8)
T3 Uptake: 27 % (ref 22–35)
T4, Total: 10.4 ug/dL (ref 4.5–12.0)
TSH: 0.62 mIU/L

## 2016-06-13 NOTE — Telephone Encounter (Signed)
Appointment has been scheduled.

## 2016-06-13 NOTE — Telephone Encounter (Signed)
Can schedule with Anderson Malta for any opening

## 2016-06-13 NOTE — Telephone Encounter (Signed)
Pt scheduled on 06/20/16 @ 9:45pm with PA. Anderson Malta, pt aware.

## 2016-06-14 LAB — URINE CULTURE: Organism ID, Bacteria: NO GROWTH

## 2016-06-20 ENCOUNTER — Encounter: Payer: Self-pay | Admitting: Physician Assistant

## 2016-06-20 ENCOUNTER — Ambulatory Visit (INDEPENDENT_AMBULATORY_CARE_PROVIDER_SITE_OTHER): Payer: Managed Care, Other (non HMO) | Admitting: Physician Assistant

## 2016-06-20 VITALS — BP 140/80 | HR 78 | Wt 188.0 lb

## 2016-06-20 DIAGNOSIS — R194 Change in bowel habit: Secondary | ICD-10-CM | POA: Diagnosis not present

## 2016-06-20 DIAGNOSIS — K219 Gastro-esophageal reflux disease without esophagitis: Secondary | ICD-10-CM | POA: Diagnosis not present

## 2016-06-20 DIAGNOSIS — R110 Nausea: Secondary | ICD-10-CM

## 2016-06-20 DIAGNOSIS — R1084 Generalized abdominal pain: Secondary | ICD-10-CM

## 2016-06-20 DIAGNOSIS — G8929 Other chronic pain: Secondary | ICD-10-CM

## 2016-06-20 MED ORDER — OMEPRAZOLE 40 MG PO CPDR: 40.0000 mg | DELAYED_RELEASE_CAPSULE | Freq: Two times a day (BID) | ORAL | 3 refills | Status: DC

## 2016-06-20 MED ORDER — HYOSCYAMINE SULFATE 0.125 MG SL SUBL: 0.1250 mg | SUBLINGUAL_TABLET | Freq: Four times a day (QID) | SUBLINGUAL | 2 refills | Status: DC | PRN

## 2016-06-20 MED ORDER — ONDANSETRON 8 MG PO TBDP: 8.0000 mg | ORAL_TABLET | Freq: Four times a day (QID) | ORAL | 1 refills | Status: DC | PRN

## 2016-06-20 MED ORDER — NA SULFATE-K SULFATE-MG SULF 17.5-3.13-1.6 GM/177ML PO SOLN: 1.0000 | Freq: Once | ORAL | 0 refills | Status: AC

## 2016-06-20 NOTE — Patient Instructions (Signed)
We have sent the following medications to your pharmacy for you to pick up at your convenience:omeprazole 90 day supply, zofran, and levsin.  You have been scheduled for an endoscopy and colonoscopy. Please follow the written instructions given to you at your visit today. Please pick up your prep supplies at the pharmacy within the next 1-3 days. If you use inhalers (even only as needed), please bring them with you on the day of your procedure. Your physician has requested that you go to www.startemmi.com and enter the access code given to you at your visit today. This web site gives a general overview about your procedure. However, you should still follow specific instructions given to you by our office regarding your preparation for the procedure.
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
## 2016-06-26 NOTE — Progress Notes (Signed)
Reviewed and agree with initial management plan.  Jaeven Wanzer T. Angelita Harnack, MD FACG 

## 2016-06-30 ENCOUNTER — Telehealth: Payer: Self-pay

## 2016-06-30 NOTE — Telephone Encounter (Signed)
Patient said she was just calling with update for Dr. Loetta Rough and no question or need to call her back.  She wanted to let Dr. Loetta Rough know that on 06/20/16 she saw GI, Dr. Fuller Plan and her PCP same day.  Dr. Fuller Plan has scheduled her for colonoscopy and endoscopy for 07/21/16 at 2:30pm.  She said this is in regard to her abd pain.

## 2016-07-07 ENCOUNTER — Encounter: Payer: Self-pay | Admitting: Gastroenterology

## 2016-07-11 ENCOUNTER — Telehealth: Payer: Self-pay | Admitting: Neurology

## 2016-07-11 NOTE — Telephone Encounter (Signed)
Pt called in requesting a new referral for PT rehab to [facility] rehab specialist phone: [phone]. He daughter has had some major health issues and she has not been able to go to rehab as requested during last office visit. She would like to start back but would like to go to [facility] where her daughter is going as well. [phone]

## 2016-07-12 ENCOUNTER — Encounter: Payer: Self-pay | Admitting: *Deleted

## 2016-07-12 NOTE — Telephone Encounter (Signed)
Mention of PT in 02-03-16 note.  Next appt with [place] 08-04-16.  Ok to send?

## 2016-07-12 NOTE — Telephone Encounter (Signed)
LMVM for pt to return call move up appt with CMNP.

## 2016-07-12 NOTE — Telephone Encounter (Signed)
It has almost been 6 months. Move up her appt please for eval first

## 2016-07-13 NOTE — Telephone Encounter (Signed)
Spoke to pt and rescheduled to 07-19-16 at 1130,  Would like new PT order (see previous phone note).

## 2016-07-19 ENCOUNTER — Other Ambulatory Visit: Payer: Self-pay | Admitting: *Deleted

## 2016-07-19 ENCOUNTER — Ambulatory Visit (INDEPENDENT_AMBULATORY_CARE_PROVIDER_SITE_OTHER): Payer: Managed Care, Other (non HMO) | Admitting: Nurse Practitioner

## 2016-07-19 ENCOUNTER — Encounter: Payer: Self-pay | Admitting: Nurse Practitioner

## 2016-07-19 VITALS — BP 147/96 | HR 121 | Ht 64.0 in | Wt 197.3 lb

## 2016-07-19 DIAGNOSIS — M542 Cervicalgia: Secondary | ICD-10-CM

## 2016-07-19 DIAGNOSIS — G43109 Migraine with aura, not intractable, without status migrainosus: Secondary | ICD-10-CM

## 2016-07-19 MED ORDER — NORTRIPTYLINE HCL 10 MG PO CAPS: 20.0000 mg | ORAL_CAPSULE | Freq: Every day | ORAL | 1 refills | Status: DC

## 2016-07-19 MED ORDER — CYCLOBENZAPRINE HCL 10 MG PO TABS: 10.0000 mg | ORAL_TABLET | Freq: Three times a day (TID) | ORAL | 6 refills | Status: DC | PRN

## 2016-07-19 MED ORDER — RIZATRIPTAN BENZOATE 10 MG PO TBDP: 10.0000 mg | ORAL_TABLET | ORAL | 11 refills | Status: DC | PRN
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
## 2016-07-19 NOTE — Patient Instructions (Signed)
Continue  Nortriptyline as preventive Continue Flexeril  Continue Ultram as needed Continue Maxalt acutely Will set up for PT in [place] F/U in 6 months

## 2016-07-21 ENCOUNTER — Encounter: Payer: Self-pay | Admitting: Gastroenterology

## 2016-07-21 ENCOUNTER — Ambulatory Visit (AMBULATORY_SURGERY_CENTER): Payer: Managed Care, Other (non HMO) | Admitting: Gastroenterology

## 2016-07-21 VITALS — BP 110/71 | HR 91 | Temp 98.7°F | Resp 16 | Ht 64.0 in | Wt 188.0 lb

## 2016-07-21 DIAGNOSIS — R194 Change in bowel habit: Secondary | ICD-10-CM | POA: Diagnosis not present

## 2016-07-21 DIAGNOSIS — K229 Disease of esophagus, unspecified: Secondary | ICD-10-CM | POA: Diagnosis not present

## 2016-07-21 DIAGNOSIS — K208 Other esophagitis: Secondary | ICD-10-CM

## 2016-07-21 DIAGNOSIS — R1084 Generalized abdominal pain: Secondary | ICD-10-CM | POA: Diagnosis present

## 2016-07-21 DIAGNOSIS — K219 Gastro-esophageal reflux disease without esophagitis: Secondary | ICD-10-CM | POA: Diagnosis not present

## 2016-07-21 MED ORDER — SODIUM CHLORIDE 0.9 % IV SOLN: 500.0000 mL | INTRAVENOUS | Status: DC

## 2016-07-21 NOTE — Progress Notes (Signed)
I have reviewed and agreed above plan. 

## 2016-07-21 NOTE — Op Note (Signed)
[place] Patient Name: Deanna George Procedure Date: 07/21/2016 2:03 PM MRN: [id number] Endoscopist: Ladene Artist , MD Age: 50 Referring MD:  Date of Birth: [birth year] Gender: Female Account #: [id number] Procedure:                Colonoscopy Indications:              Generalized abdominal pain, Change in bowel habits Medicines:                Monitored Anesthesia Care Procedure:                Pre-Anesthesia Assessment:                           - Prior to the procedure, a History and Physical                            was performed, and patient medications and                            allergies were reviewed. The patient's tolerance of                            previous anesthesia was also reviewed. The risks                            and benefits of the procedure and the sedation                            options and risks were discussed with the patient.                            All questions were answered, and informed consent                            was obtained. Prior Anticoagulants: The patient has                            taken no previous anticoagulant or antiplatelet                            agents. ASA Grade Assessment: II - A patient with                            mild systemic disease. After reviewing the risks                            and benefits, the patient was deemed in                            satisfactory condition to undergo the procedure.                           After obtaining informed consent, the colonoscope  was passed under direct vision. Throughout the                            procedure, the patient's blood pressure, pulse, and                            oxygen saturations were monitored continuously. The                            Model PCF-H190L [id number]) scope was introduced                            through the anus and advanced to the the cecum,                            identified by  appendiceal orifice and ileocecal                            valve. The ileocecal valve, appendiceal orifice,                            and rectum were photographed. The quality of the                            bowel preparation was excellent. The colonoscopy                            was performed without difficulty. The patient                            tolerated the procedure well. Scope In: 2:13:17 PM Scope Out: 2:26:19 PM Scope Withdrawal Time: 0 hours 10 minutes 57 seconds  Total Procedure Duration: 0 hours 13 minutes 2 seconds  Findings:                 The entire examined colon appeared normal on direct                            and retroflexion views. Complications:            No immediate complications. Estimated blood loss:                            None. Estimated Blood Loss:     Estimated blood loss: none. Impression:               - The entire examined colon is normal on direct and                            retroflexion views.                           - No specimens collected. Recommendation:           - Repeat colonoscopy in 10 years for screening  purposes.                           - Patient has a contact number available for                            emergencies. The signs and symptoms of potential                            delayed complications were discussed with the                            patient. Return to normal activities tomorrow.                            Written discharge instructions were provided to the                            patient.                           - Resume previous diet.                           - Continue present medications. Ladene Artist, MD 07/21/2016 2:28:47 PM This report has been signed electronically.

## 2016-07-21 NOTE — Progress Notes (Signed)
Report to PACU, RN, vss, BBS= Clear.  

## 2016-07-21 NOTE — Progress Notes (Signed)
Called to room to assist during endoscopic procedure.  Patient ID and intended procedure confirmed with present staff. Received instructions for my participation in the procedure from the performing physician.  

## 2016-07-21 NOTE — Op Note (Signed)
[place] Patient Name: Deanna George Procedure Date: 07/21/2016 2:03 PM MRN: [id number] Endoscopist: Ladene Artist , MD Age: 50 Referring MD:  Date of Birth: 11/30/1965 Gender: Female Account #: [id number] Procedure:                Upper GI endoscopy Indications:              Generalized abdominal pain, Gastro-esophageal                            reflux disease Medicines:                Monitored Anesthesia Care Procedure:                Pre-Anesthesia Assessment:                           - Prior to the procedure, a History and Physical                            was performed, and patient medications and                            allergies were reviewed. The patient's tolerance of                            previous anesthesia was also reviewed. The risks                            and benefits of the procedure and the sedation                            options and risks were discussed with the patient.                            All questions were answered, and informed consent                            was obtained. Prior Anticoagulants: The patient has                            taken no previous anticoagulant or antiplatelet                            agents. ASA Grade Assessment: II - A patient with                            mild systemic disease. After reviewing the risks                            and benefits, the patient was deemed in                            satisfactory condition to undergo the procedure.  After obtaining informed consent, the endoscope was                            passed under direct vision. Throughout the                            procedure, the patient's blood pressure, pulse, and                            oxygen saturations were monitored continuously. The                            Model GIF-HQ190 [id number]) scope was introduced                            through the mouth, and advanced to the second  part                            of duodenum. The upper GI endoscopy was                            accomplished without difficulty. The patient                            tolerated the procedure well. Scope In: Scope Out: Findings:                 The Z-line was variable and was found 35 cm from                            the incisors. Biopsies were taken with a cold                            forceps for histology.                           A small hiatal hernia was present on retroflexed                            view.                           The exam of the stomach was otherwise normal.                           The duodenal bulb and second portion of the                            duodenum were normal.                           The exam of the esophagus was otherwise normal. Complications:            No immediate complications. Estimated Blood Loss:     Estimated blood loss was minimal. Impression:               -  Z-line variable, 35 cm from the incisors.                            Biopsied.                           - Small hiatal hernia.                           - Normal duodenal bulb and second portion of the                            duodenum. Recommendation:           - Patient has a contact number available for                            emergencies. The signs and symptoms of potential                            delayed complications were discussed with the                            patient. Return to normal activities tomorrow.                            Written discharge instructions were provided to the                            patient.                           - Resume previous diet.                           - Continue present medications.                           - Antireflux measures                           - Await pathology results. Ladene Artist, MD 07/21/2016 2:43:30 PM This report has been signed electronically.

## 2016-07-21 NOTE — Patient Instructions (Signed)
YOU HAD AN ENDOSCOPIC PROCEDURE TODAY AT [facility]:   Refer to the procedure report that was given to you for any specific questions about what was found during the examination.  If the procedure report does not answer your questions, please call your gastroenterologist to clarify.  If you requested that your care partner not be given the details of your procedure findings, then the procedure report has been included in a sealed envelope for you to review at your convenience later.  YOU SHOULD EXPECT: Some feelings of bloating in the abdomen. Passage of more gas than usual.  Walking can help get rid of the air that was put into your GI tract during the procedure and reduce the bloating. If you had a lower endoscopy (such as a colonoscopy or flexible sigmoidoscopy) you may notice spotting of blood in your stool or on the toilet paper. If you underwent a bowel prep for your procedure, you may not have a normal bowel movement for a few days.  Please Note:  You might notice some irritation and congestion in your nose or some drainage.  This is from the oxygen used during your procedure.  There is no need for concern and it should clear up in a day or so.  SYMPTOMS TO REPORT IMMEDIATELY:   Following lower endoscopy (colonoscopy or flexible sigmoidoscopy):  Excessive amounts of blood in the stool  Significant tenderness or worsening of abdominal pains  Swelling of the abdomen that is new, acute  Fever of 100F or higher   Following upper endoscopy (EGD)  Vomiting of blood or coffee ground material  New chest pain or pain under the shoulder blades  Painful or persistently difficult swallowing  New shortness of breath  Fever of 100F or higher  Black, tarry-looking stools  For urgent or emergent issues, a gastroenterologist can be reached at any hour by calling [phone].   DIET:  We do recommend a small meal at first, but then you may proceed to your regular diet.  Drink  plenty of fluids but you should avoid alcoholic beverages for 24 hours.  ACTIVITY:  You should plan to take it easy for the rest of today and you should NOT DRIVE or use heavy machinery until tomorrow (because of the sedation medicines used during the test).    FOLLOW UP: Our staff will call the number listed on your records the next business day following your procedure to check on you and address any questions or concerns that you may have regarding the information given to you following your procedure. If we do not reach you, we will leave a message.  However, if you are feeling well and you are not experiencing any problems, there is no need to return our call.  We will assume that you have returned to your regular daily activities without incident.  If any biopsies were taken you will be contacted by phone or by letter within the next 1-3 weeks.  Please call us at [phone] if you have not heard about the biopsies in 3 weeks.    SIGNATURES/CONFIDENTIALITY: You and/or your care partner have signed paperwork which will be entered into your electronic medical record.  These signatures attest to the fact that that the information above on your After Visit Summary has been reviewed and is understood.  Full responsibility of the confidentiality of this discharge information lies with you and/or your care-partner.    Resume medications. Information given on GERD/HIATAL HERNIA.

## 2016-07-24 ENCOUNTER — Telehealth: Payer: Self-pay | Admitting: *Deleted

## 2016-07-24 NOTE — Telephone Encounter (Signed)
  Follow up Call-  Call back number 07/21/2016 08/06/2014  Post procedure Call Back phone  # [phone] cell [phone]  Permission to leave phone message Yes Yes  Some recent data might be hidden     Patient questions:  Do you have a fever, pain , or abdominal swelling? No. Pain Score  0 *  Have you tolerated food without any problems? Yes.    Have you been able to return to your normal activities? Yes.    Do you have any questions about your discharge instructions: Diet   No. Medications  No. Follow up visit  No.  Do you have questions or concerns about your Care? No.  Actions: * If pain score is 4 or above: No action needed, pain <4.

## 2016-07-25 ENCOUNTER — Telehealth: Payer: Self-pay | Admitting: Nurse Practitioner

## 2016-07-25 NOTE — Telephone Encounter (Signed)
LMVM for pt on her mobile to return call re: PT.

## 2016-07-25 NOTE — Telephone Encounter (Signed)
LMVM for pt to return call about the facility for her rehab.

## 2016-07-25 NOTE — Telephone Encounter (Signed)
Called Patient and left her a message relaying I re faxed her order .  Sent referral to [facility]. # fax [phone] # [phone] . Thanks Hinton Dyer

## 2016-07-25 NOTE — Telephone Encounter (Signed)
Deanna George with [facility] [phone] called said they have rec'd orders and pt notes but they are strictly a fitness ctr and does not do any PT or rehab.

## 2016-07-26 NOTE — Telephone Encounter (Signed)
Sandy see note pt phone call.

## 2016-07-26 NOTE — Telephone Encounter (Signed)
Spoke to pt.   She has been to [facility] outppt rehab in [place].  [phone],  [phone].  She will go there.   I called and spoke to receptionist and referral is in there workque.  I LMVM for pt to call and get herself scheduled.

## 2016-07-26 NOTE — Telephone Encounter (Signed)
I spoke to pt.  She stated she will go to [facility] outpt rehab in [place], [place] I called and they do have in there workque.  I called and LMVM for pt to call them to schedule.  (gave #). She is to call back if questions.

## 2016-07-27 ENCOUNTER — Encounter: Payer: Self-pay | Admitting: Gastroenterology

## 2016-08-01 ENCOUNTER — Encounter: Payer: Self-pay | Admitting: *Deleted

## 2016-08-04 ENCOUNTER — Ambulatory Visit: Payer: Managed Care, Other (non HMO) | Admitting: Nurse Practitioner

## 2016-08-23 DIAGNOSIS — H903 Sensorineural hearing loss, bilateral: Secondary | ICD-10-CM | POA: Insufficient documentation

## 2016-08-23 DIAGNOSIS — H722X1 Other marginal perforations of tympanic membrane, right ear: Secondary | ICD-10-CM | POA: Insufficient documentation

## 2016-08-23 DIAGNOSIS — R42 Dizziness and giddiness: Secondary | ICD-10-CM | POA: Insufficient documentation

## 2016-08-24 NOTE — Telephone Encounter (Signed)
Pt called in stating she has talked with the receptionist at the [facility] out pt rehab and they do not have referral. It seems they do not know how to work from [employer]. Can the referral be faxed. Also please call the pt to update.

## 2016-08-25 NOTE — Telephone Encounter (Signed)
LMVM for [facility] [phone] that faxed over referral, checking on this.  (is also on que per [place]).  Left telephone # to pt and mine as well if needed.

## 2016-09-01 ENCOUNTER — Encounter: Payer: Self-pay | Admitting: Rehabilitative and Restorative Service Providers"

## 2016-09-01 ENCOUNTER — Ambulatory Visit (INDEPENDENT_AMBULATORY_CARE_PROVIDER_SITE_OTHER): Payer: Managed Care, Other (non HMO) | Admitting: Rehabilitative and Restorative Service Providers"

## 2016-09-01 DIAGNOSIS — M542 Cervicalgia: Secondary | ICD-10-CM | POA: Diagnosis not present

## 2016-09-01 DIAGNOSIS — R293 Abnormal posture: Secondary | ICD-10-CM | POA: Diagnosis not present

## 2016-09-01 DIAGNOSIS — R29898 Other symptoms and signs involving the musculoskeletal system: Secondary | ICD-10-CM | POA: Diagnosis not present

## 2016-09-01 NOTE — Patient Instructions (Addendum)
Flexors, Supine    Lie on back, head on small, rolled towel. Tip chin down. Tighten muscles in back of throat. Hold _10__ seconds. Repeat _5-10__ times per session. Do _2-3__ sessions per day.   SUPINE Tips A    Being in the supine position means to be lying on the back. Lying on the back is the position of least compression on the bones and discs of the spine, and helps to re-align the natural curves of the back. Lying on back with arms out and supported  Working toward 5 min  Bend arms at elbows for a few seconds if you need a break from the stretch then return arms to stretch position    Shoulder Blade Squeeze    Rotate shoulders back, then squeeze shoulder blades down and back Hold 10 sec Repeat __10__ times. Do __2-3__ sessions per day.   TENS UNIT: This is helpful for muscle pain and spasm.   Search and Purchase a TENS 7000 2nd edition at www.tenspros.com. It should be less than $30.     TENS unit instructions: Do not shower or bathe with the unit on Turn the unit off before removing electrodes or batteries If the electrodes lose stickiness add a drop of water to the electrodes after they are disconnected from the unit and place on plastic sheet. If you continued to have difficulty, call the TENS unit company to purchase more electrodes. Do not apply lotion on the skin area prior to use. Make sure the skin is clean and dry as this will help prolong the life of the electrodes. After use, always check skin for unusual red areas, rash or other skin difficulties. If there are any skin problems, does not apply electrodes to the same area. Never remove the electrodes from the unit by pulling the wires. Do not use the TENS unit or electrodes other than as directed. Do not change electrode placement without consultating your therapist or physician. Keep 2 fingers with between each electrode.  Sleeping on Back  Place pillow under knees. A pillow with cervical support and a  roll around waist are also helpful. Copyright  VHI. All rights reserved.  Sleeping on Side Place pillow between knees. Use cervical support under neck and a roll around waist as needed. Copyright  VHI. All rights reserved.   Sleeping on Stomach   If this is the only desirable sleeping position, place pillow under lower legs, and under stomach or chest as needed.  Posture - Sitting   Sit upright, head facing forward. Try using a roll to support lower back. Keep shoulders relaxed, and avoid rounded back. Keep hips level with knees. Avoid crossing legs for long periods. Stand to Sit / Sit to Stand   To sit: Bend knees to lower self onto front edge of chair, then scoot back on seat. To stand: Reverse sequence by placing one foot forward, and scoot to front of seat. Use rocking motion to stand up.   Work Height and Reach  Ideal work height is no more than 2 to 4 inches below elbow level when standing, and at elbow level when sitting. Reaching should be limited to arm's length, with elbows slightly bent.  Bending  Bend at hips and knees, not back. Keep feet shoulder-width apart.    Posture - Standing   Good posture is important. Avoid slouching and forward head thrust. Maintain curve in low back and align ears over shoul- ders, hips over ankles.  Alternating Positions   Alternate  tasks and change positions frequently to reduce fatigue and muscle tension. Take rest breaks. Computer Work   Position work to Programmer, multimedia. Use proper work and seat height. Keep shoulders back and down, wrists straight, and elbows at right angles. Use chair that provides full back support. Add footrest and lumbar roll as needed.  Getting Into / Out of Car  Lower self onto seat, scoot back, then bring in one leg at a time. Reverse sequence to get out.  Dressing  Lie on back to pull socks or slacks over feet, or sit and bend leg while keeping back straight.    Housework - Sink  Place one foot  on ledge of cabinet under sink when standing at sink for prolonged periods.   Pushing / Pulling  Pushing is preferable to pulling. Keep back in proper alignment, and use leg muscles to do the work.  Deep Squat   Squat and lift with both arms held against upper trunk. Tighten stomach muscles without holding breath. Use smooth movements to avoid jerking.  Avoid Twisting   Avoid twisting or bending back. Pivot around using foot movements, and bend at knees if needed when reaching for articles.  Carrying Luggage   Distribute weight evenly on both sides. Use a cart whenever possible. Do not twist trunk. Move body as a unit.   Lifting Principles .Maintain proper posture and head alignment. .Slide object as close as possible before lifting. .Move obstacles out of the way. .Test before lifting; ask for help if too heavy. .Tighten stomach muscles without holding breath. .Use smooth movements; do not jerk. .Use legs to do the work, and pivot with feet. .Distribute the work load symmetrically and close to the center of trunk. .Push instead of pull whenever possible.   Ask For Help   Ask for help and delegate to others when possible. Coordinate your movements when lifting together, and maintain the low back curve.  Log Roll   Lying on back, bend left knee and place left arm across chest. Roll all in one movement to the right. Reverse to roll to the left. Always move as one unit. Housework - Sweeping  Use long-handled equipment to avoid stooping.   Housework - Wiping  Position yourself as close as possible to reach work surface. Avoid straining your back.  Laundry - Unloading Wash   To unload small items at bottom of washer, lift leg opposite to arm being used to reach.  [place] close to area to be raked. Use arm movements to do the work. Keep back straight and avoid twisting.     Cart  When reaching into cart with one arm, lift opposite leg to keep  back straight.   Getting Into / Out of Bed  Lower self to lie down on one side by raising legs and lowering head at the same time. Use arms to assist moving without twisting. Bend both knees to roll onto back if desired. To sit up, start from lying on side, and use same move-ments in reverse. Housework - Vacuuming  Hold the vacuum with arm held at side. Step back and forth to move it, keeping head up. Avoid twisting.   Laundry - IT consultant so that bending and twisting can be avoided.   Laundry - Unloading Dryer  Squat down to reach into clothes dryer or use a reacher.  Gardening - Weeding / Probation officer or Kneel. Knee pads may be helpful.
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
## 2016-09-08 ENCOUNTER — Ambulatory Visit (INDEPENDENT_AMBULATORY_CARE_PROVIDER_SITE_OTHER): Payer: Managed Care, Other (non HMO) | Admitting: Physical Therapy

## 2016-09-08 DIAGNOSIS — M542 Cervicalgia: Secondary | ICD-10-CM

## 2016-09-08 DIAGNOSIS — R29898 Other symptoms and signs involving the musculoskeletal system: Secondary | ICD-10-CM | POA: Diagnosis not present

## 2016-09-08 DIAGNOSIS — R293 Abnormal posture: Secondary | ICD-10-CM | POA: Diagnosis not present

## 2016-09-08 NOTE — Therapy (Addendum)
[place], [place], [address] Phone: [phone]   Fax:  [phone]  Physical Therapy Treatment  Patient Details  Name: Deanna George MRN: [id number] Date of Birth: 12-10-1965 Referring Provider: Jerline Pain, NP   Encounter Date: 09/08/2016      PT End of Session - 09/08/16 1337    Visit Number 2   Number of Visits 12   Date for PT Re-Evaluation 10/13/16   PT Start Time 5035   PT Stop Time 1439   PT Time Calculation (min) 66 min   Activity Tolerance Patient tolerated treatment well;No increased pain   Behavior During Therapy WFL for tasks assessed/performed      Past Medical History:  Diagnosis Date  . Anal fissure   . Cervical disc disorder   . Clostridium difficile infection   . Dysrhythmia    tachycardia  . Esophageal stricture   . GERD (gastroesophageal reflux disease)   . Headache   . Hemorrhoids   . Hypertension   . IBS (irritable bowel syndrome)   . Interstitial cystitis   . Noncompliance    pt denies  . OA (osteoarthritis) of knee   . Palpitations   . Pneumonia   . 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 Situational stress   . Urinary tract infection     Past Surgical History:  Procedure Laterality Date  . CESAREAN SECTION  1998,2000   x2  . CHOLECYSTECTOMY N/A 01/28/2013   Procedure: LAPAROSCOPIC CHOLECYSTECTOMY WITH INTRAOPERATIVE CHOLANGIOGRAM;  Surgeon: Earnstine Regal, MD;  Location: [facility];  Service: General;  Laterality: N/A;  . COLONOSCOPY    . Foot pin post fracture  09/2008   Left foot  . KNEE ARTHROSCOPY  2011    right  . LAPAROSCOPIC BILATERAL SALPINGECTOMY Bilateral 02/02/2015   Procedure: BILATERAL SALPINGECTOMY, ;  Surgeon: Anastasio Auerbach, MD;  Location: [facility];  Service: Gynecology;  Laterality: Bilateral;  . Laparoscopic surgery  01/2007   uterus and ovary x 2  . LAPAROSCOPIC VAGINAL HYSTERECTOMY     02/2008  . LAPAROSCOPY N/A  02/02/2015   Procedure: LAPAROSCOPY DIAGNOSTIC, FULGERATION ENDOMETREOSIS, EXCISION RIGHT OVARIAN CYST, LYSIS OF ADHESIONS, EXCISION VULVAR AND VAGINAL CYST ;  Surgeon: Anastasio Auerbach, MD;  Location: [facility];  Service: Gynecology;  Laterality: N/A;  . US ECHOCARDIOGRAPHY  07/31/2008   EF 55-60% with mild LVH    There were no vitals filed for this visit.      Subjective Assessment - 09/08/16 1344    Subjective Pt reports her pain is manageable.  She was sore after last session.  She has done exercises 2x since last visit.     Currently in Pain? Yes   Pain Score 3    Pain Location Neck   Pain Orientation Right   Pain Descriptors / Indicators Burning;Aching   Aggravating Factors  bending forward for studying   Pain Relieving Factors chiropractic care, OTC meds             [facility] PT Assessment - 09/08/16 0001      Assessment   Medical Diagnosis Migraine; neck pain    Onset Date/Surgical Date 03/08/15   Hand Dominance Right   Next MD Visit 11/18/2016   Prior Therapy yes here last year x 2 and currently receives chiropractic care      AROM - measurements taken at end of session.    Cervical - Right Side Bend 32  Cervical - Left Side Bend 25   Cervical - Right Rotation 32   Cervical - Left Rotation 58          [facility] Adult PT Treatment/Exercise - 09/08/16 0001      Self-Care   Self-Care Other Self-Care Comments   Other Self-Care Comments  Pt educated on self MFR to ant/ lat neck; ball release to posterior shoulder and post neck structures.  Pt verbalized understanding and returned demo with VC and tactile cues. Pt instructed in supine to/from sit via log roll.  Pt returned demo with VC x 4 reps      Neck Exercises: Standing   Other Standing Exercises scap squeeze with noodle 10 sec x 10, head press x 5 sec x 10 reps     Neck Exercises: Seated   Other Seated Exercise thoracic extension with hands supporting head (in chair)      Neck Exercises: Supine   Other Supine  Exercise thoracic extension over bolster at various segments      Moist Heat Therapy   Number Minutes Moist Heat 10 Minutes   Moist Heat Location Cervical     Electrical Stimulation   Electrical Stimulation Location bilat cervical/upper traps   Electrical Stimulation Action IFC   Electrical Stimulation Parameters to tolerance    Electrical Stimulation Goals Tone;Pain     Manual Therapy   Manual Therapy Myofascial release;Soft tissue mobilization;Passive ROM;Manual Traction   Manual therapy comments pt in supported supine   Soft tissue mobilization pin and stretch to bilat trap, levator, scalene.      Myofascial Release to Rt/Lt platysma, SCM, upper trap.  Suboccipital release.     Passive ROM cervical rotation, lateral flexion    Manual Traction gentle cervical traction holding 30 sec x 5 reps                      PT Long Term Goals - 09/08/16 1434      PT LONG TERM GOAL #1   Title Improve posture and alignment with patient to demo upright posture with head in good alignment 10/13/16   Time 6   Period Weeks   Status On-going     PT LONG TERM GOAL #2   Title increase cervical rotation bilat =/> 65 degrees 10/13/16   Time 6   Period Weeks   Status On-going     PT LONG TERM GOAL #3   Title report pain decrease =/> 75% with daily activity 10/13/16   Time 6   Period Weeks   Status On-going     PT LONG TERM GOAL #4   Title Independent in HEP 10/13/16   Time 6   Period Weeks   Status On-going     PT LONG TERM GOAL #5   Title improve FOTO =/< 41% limitation 10/13/16   Time 6   Period Weeks   Status On-going               Plan - 09/08/16 1432    Clinical Impression Statement Pt had decreased tissue tightness after manual therapy. Pt reported decreased pain at end of session.   Improved cervical ROM today.  Progressing towards goals.      Patient will benefit from skilled therapeutic intervention in order to improve the following deficits and  impairments:     Visit Diagnosis: Cervicalgia  Other symptoms and signs involving the musculoskeletal system  Abnormal posture     Problem List Patient Active Problem List  Diagnosis Date Noted  . Migraine 08/13/2014  . Neck pain 08/13/2014  . Interstitial cystitis 01/24/2013  . Personal history of colonic polyps 01/24/2013  . HTN (hypertension) 09/05/2011  . CARCINOMA, SKIN, SQUAMOUS CELL 02/17/2009  . BLOOD IN STOOL 02/17/2009  . ABDOMINAL PAIN-LLQ 02/17/2009  . GERD 12/10/2008  . OTHER DYSPHAGIA 12/10/2008   Kerin Perna, PTA 09/08/16 2:46 PM  [place], [place], [address] Phone: [phone]   Fax:  [phone]  Name: Deanna George MRN: [id number] Date of Birth: 1965-12-15  PHYSICAL THERAPY DISCHARGE SUMMARY  Visits from Start of Care: 2  Current functional level related to goals / functional outcomes: Some improvement with PT treatment. See note from visits.   Remaining deficits: Continued muscular tightness through cervical spine   Education / Equipment: HEP Plan: Patient agrees to discharge.  Patient goals were partially met. Patient is being discharged due to not returning since the last visit.  ?????    Celyn P. Helene Kelp PT, MPH 10/23/16 7:51 AM

## 2016-10-04 ENCOUNTER — Other Ambulatory Visit: Payer: Self-pay | Admitting: Nurse Practitioner

## 2016-10-12 ENCOUNTER — Telehealth: Payer: Self-pay | Admitting: *Deleted

## 2016-10-12 ENCOUNTER — Other Ambulatory Visit: Payer: Self-pay | Admitting: Nurse Practitioner

## 2016-10-12 NOTE — Telephone Encounter (Signed)
Pt called asking to be referred [facility] with Dr.Erin Jeani Hawking, due to constant pelvic pain/ surgery , pt had appointment with Dr.Stark she had colonoscopy and endoscopy as directed. Pt said she had heard great things about this provider from several people, pt said you she thought you mentioned to her if pain should continue referral for [facility] would be next step. Please advise

## 2016-10-12 NOTE — Telephone Encounter (Signed)
Okay to refer? 

## 2016-10-17 NOTE — Telephone Encounter (Signed)
I called Dr.Cary office and was informed notes will need to be faxed to [phone] and the referral coordinator will triage the referral and contact the patient to schedule. I patient and left the above on her voicemail.

## 2016-10-23 ENCOUNTER — Other Ambulatory Visit: Payer: Self-pay | Admitting: Neurology

## 2016-11-21 NOTE — Telephone Encounter (Signed)
Pt scheduled on 12/06/16 @ 11:20am with Dr.Carey.

## 2016-11-29 ENCOUNTER — Ambulatory Visit: Payer: Managed Care, Other (non HMO) | Admitting: Nurse Practitioner

## 2016-12-01 ENCOUNTER — Ambulatory Visit: Payer: Managed Care, Other (non HMO) | Admitting: Nurse Practitioner

## 2016-12-08 ENCOUNTER — Ambulatory Visit: Payer: Managed Care, Other (non HMO) | Admitting: Nurse Practitioner

## 2016-12-11 DIAGNOSIS — G8918 Other acute postprocedural pain: Secondary | ICD-10-CM | POA: Insufficient documentation

## 2016-12-11 DIAGNOSIS — R102 Pelvic and perineal pain: Secondary | ICD-10-CM | POA: Insufficient documentation

## 2016-12-19 ENCOUNTER — Other Ambulatory Visit: Payer: Self-pay | Admitting: *Deleted

## 2016-12-19 MED ORDER — TRAMADOL HCL 50 MG PO TABS: ORAL_TABLET | ORAL | 5 refills | Status: DC

## 2016-12-19 NOTE — Telephone Encounter (Signed)
Refill request for tramadol.  Has appt 01-19-17 with CM/NP

## 2016-12-19 NOTE — Telephone Encounter (Signed)
Fax confirmation received [phone]. sy

## 2016-12-21 ENCOUNTER — Other Ambulatory Visit: Payer: Self-pay | Admitting: Family Medicine

## 2016-12-21 DIAGNOSIS — Z1231 Encounter for screening mammogram for malignant neoplasm of breast: Secondary | ICD-10-CM

## 2017-01-11 HISTORY — PX: BILATERAL OOPHORECTOMY: SHX1221

## 2017-01-19 ENCOUNTER — Ambulatory Visit: Payer: Managed Care, Other (non HMO) | Admitting: Nurse Practitioner

## 2017-01-22 ENCOUNTER — Ambulatory Visit: Payer: Managed Care, Other (non HMO)

## 2017-02-01 ENCOUNTER — Encounter: Payer: Self-pay | Admitting: Gynecology

## 2017-02-12 ENCOUNTER — Telehealth: Payer: Self-pay | Admitting: Nurse Practitioner

## 2017-02-12 NOTE — Telephone Encounter (Signed)
Patient called office in reference to shaking, all over body pain, concerns with nervous system over years of surgeries and most recent surgery 01/05/17.  Daily task is a struggle unable to hold a pencil feels that she is losing sensation in extremities. Patient continues to have migraines with the result of stress.  Patient describes it as aching and body hurts.

## 2017-02-12 NOTE — Telephone Encounter (Signed)
Spoke with patient and informed her that she has a FU on 02/23/17 but there is availability prior to that date. She stated she had to clear with her daughter before rescheduling since she is transportation for her daughter.  This RN then advised that if any of her issues may be a result of her recent surgery, she needs to contact her surgeon., patient stated she has a FU with surgeon and none of her issues she wants to discuss with Hoyle Sauer are related to her recent surgery.  She stated she would call back if her FU can be moved sooner. She verbalized appreciation for call.

## 2017-02-19 ENCOUNTER — Ambulatory Visit: Payer: Managed Care, Other (non HMO)

## 2017-02-23 ENCOUNTER — Encounter (INDEPENDENT_AMBULATORY_CARE_PROVIDER_SITE_OTHER): Payer: Self-pay

## 2017-02-23 ENCOUNTER — Ambulatory Visit (INDEPENDENT_AMBULATORY_CARE_PROVIDER_SITE_OTHER): Payer: Managed Care, Other (non HMO) | Admitting: Nurse Practitioner

## 2017-02-23 ENCOUNTER — Encounter: Payer: Self-pay | Admitting: Nurse Practitioner

## 2017-02-23 ENCOUNTER — Telehealth: Payer: Self-pay | Admitting: Neurology

## 2017-02-23 VITALS — BP 144/96 | HR 113 | Wt 196.2 lb

## 2017-02-23 DIAGNOSIS — R202 Paresthesia of skin: Secondary | ICD-10-CM

## 2017-02-23 DIAGNOSIS — M542 Cervicalgia: Secondary | ICD-10-CM

## 2017-02-23 DIAGNOSIS — G43109 Migraine with aura, not intractable, without status migrainosus: Secondary | ICD-10-CM | POA: Diagnosis not present

## 2017-02-23 DIAGNOSIS — R251 Tremor, unspecified: Secondary | ICD-10-CM | POA: Diagnosis not present

## 2017-02-23 DIAGNOSIS — R200 Anesthesia of skin: Secondary | ICD-10-CM | POA: Diagnosis not present

## 2017-02-23 MED ORDER — NORTRIPTYLINE HCL 25 MG PO CAPS: 25.0000 mg | ORAL_CAPSULE | Freq: Every day | ORAL | 2 refills | Status: DC
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
## 2017-02-23 NOTE — Patient Instructions (Signed)
Increase  Nortriptyline  To 25mg  at hs as preventive Continue Flexeril  Continue Ultram as needed Continue Maxalt acutely Will order EMG, [place] F/U with Dr. Krista Blue

## 2017-02-26 ENCOUNTER — Other Ambulatory Visit: Payer: Self-pay | Admitting: Physician Assistant

## 2017-02-28 ENCOUNTER — Ambulatory Visit: Payer: Managed Care, Other (non HMO)

## 2017-02-28 ENCOUNTER — Telehealth: Payer: Self-pay | Admitting: Neurology

## 2017-02-28 NOTE — Telephone Encounter (Signed)
Attempted to reach patient again prior to leaving the office.  Left another message requesting a call back.

## 2017-02-28 NOTE — Telephone Encounter (Signed)
Just wanted to let you know the spots available for [name] are 4/25 @ 2:45 with [name] or 4/30 @ 10:15 or 2:45 w/ Deanna George. Let me know if you need me to do anything to schedule. Thanks!

## 2017-02-28 NOTE — Telephone Encounter (Signed)
Left message on voicemail for a return call.

## 2017-03-01 NOTE — Telephone Encounter (Addendum)
Left another message for her this morning requesting a return call as soon as possible.  We are trying to get her worked in for her [place] before the end of April.

## 2017-03-01 NOTE — Telephone Encounter (Signed)
Left another message in effort to get her worked into our schedule.  When she calls back, we will do our best to accommodate her request.

## 2017-03-06 NOTE — Telephone Encounter (Signed)
Spoke with patient after returning offices call in reference to scheduling her a NCV/EMG.  Patient states she apologizes for the failed return calls, but had a personal family matter occurring over the weekend.  Patient states she is in such severe pain she would like to know if she is able to do the NCV and not the EMG study.  If unable to do patient would like to know what other options she has to help her bare the pain.  Please call

## 2017-03-06 NOTE — Telephone Encounter (Signed)
Called several patients and was able to get an opening for the patient to have NCV/EMG on 03/16/17.  The patient initially accepted the appt but later declined.  She would like to see Dr. Krista Blue for an office visit first.  An appt has been scheduled for her.

## 2017-03-07 NOTE — Telephone Encounter (Signed)
She has been scheduled for a follow up appt rather than NCV/EMG, per pt's request.  The time has been extended to 60 minutes to allow Dr. Krista Blue more time with her.

## 2017-03-16 ENCOUNTER — Encounter: Payer: Self-pay | Admitting: Neurology

## 2017-03-27 ENCOUNTER — Ambulatory Visit (INDEPENDENT_AMBULATORY_CARE_PROVIDER_SITE_OTHER): Payer: Managed Care, Other (non HMO) | Admitting: Neurology

## 2017-03-27 ENCOUNTER — Encounter: Payer: Self-pay | Admitting: Neurology

## 2017-03-27 VITALS — BP 131/81 | HR 110 | Ht 64.0 in | Wt 203.0 lb

## 2017-03-27 DIAGNOSIS — R200 Anesthesia of skin: Secondary | ICD-10-CM | POA: Diagnosis not present

## 2017-03-27 DIAGNOSIS — G43109 Migraine with aura, not intractable, without status migrainosus: Secondary | ICD-10-CM | POA: Diagnosis not present

## 2017-03-27 DIAGNOSIS — M542 Cervicalgia: Secondary | ICD-10-CM | POA: Diagnosis not present

## 2017-03-27 DIAGNOSIS — R251 Tremor, unspecified: Secondary | ICD-10-CM | POA: Diagnosis not present

## 2017-03-27 DIAGNOSIS — R202 Paresthesia of skin: Secondary | ICD-10-CM

## 2017-03-27 MED ORDER — ONDANSETRON 4 MG PO TBDP: 8.0000 mg | ORAL_TABLET | Freq: Four times a day (QID) | ORAL | 11 refills | Status: DC | PRN

## 2017-03-27 MED ORDER — SUMATRIPTAN SUCCINATE 6 MG/0.5ML ~~LOC~~ SOLN: 6.0000 mg | SUBCUTANEOUS | 6 refills | Status: DC | PRN

## 2017-03-27 MED ORDER — NORTRIPTYLINE HCL 25 MG PO CAPS: 50.0000 mg | ORAL_CAPSULE | Freq: Every day | ORAL | 11 refills | Status: DC

## 2017-03-27 MED ORDER — PROMETHAZINE HCL 25 MG PO TABS: 25.0000 mg | ORAL_TABLET | Freq: Four times a day (QID) | ORAL | 4 refills | Status: DC | PRN

## 2017-03-27 MED ORDER — DULOXETINE HCL 60 MG PO CPEP: 60.0000 mg | ORAL_CAPSULE | Freq: Every day | ORAL | 11 refills | Status: DC
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
## 2017-03-28 ENCOUNTER — Other Ambulatory Visit: Payer: Self-pay | Admitting: Neurology

## 2017-03-28 ENCOUNTER — Telehealth: Payer: Self-pay | Admitting: Neurology

## 2017-03-28 ENCOUNTER — Ambulatory Visit
Admission: RE | Admit: 2017-03-28 | Discharge: 2017-03-28 | Disposition: A | Discharge status: Home / Self Care | Payer: Managed Care, Other (non HMO) | Source: Ambulatory Visit | Attending: Family Medicine | Admitting: Family Medicine

## 2017-03-28 ENCOUNTER — Encounter: Payer: Self-pay | Admitting: *Deleted

## 2017-03-28 DIAGNOSIS — R899 Unspecified abnormal finding in specimens from other organs, systems and tissues: Secondary | ICD-10-CM

## 2017-03-28 DIAGNOSIS — Z1231 Encounter for screening mammogram for malignant neoplasm of breast: Secondary | ICD-10-CM

## 2017-03-28 LAB — FOLATE: Folate: 5.2 ng/mL (ref >3.0)

## 2017-03-28 LAB — COMPREHENSIVE METABOLIC PANEL
ALT: 11 IU/L (ref 0–32)
AST: 14 IU/L (ref 0–40)
Albumin/Globulin Ratio: 1.7 (ref 1.2–2.2)
Albumin: 4.5 g/dL (ref 3.5–5.5)
Alkaline Phosphatase: 101 IU/L (ref 39–117)
BUN/Creatinine Ratio: 7 — ABNORMAL LOW (ref 9–23)
BUN: 6 mg/dL (ref 6–24)
Bilirubin Total: 0.2 mg/dL (ref 0.0–1.2)
CO2: 26 mmol/L (ref 18–29)
Calcium: 9.8 mg/dL (ref 8.7–10.2)
Chloride: 100 mmol/L (ref 96–106)
Creatinine, Ser: 0.92 mg/dL (ref 0.57–1.00)
GFR calc Af Amer: 85 mL/min/{1.73_m2} (ref >59)
GFR calc non Af Amer: 73 mL/min/{1.73_m2} (ref >59)
Globulin, Total: 2.7 g/dL (ref 1.5–4.5)
Glucose: 119 mg/dL — ABNORMAL HIGH (ref 65–99)
Potassium: 4.8 mmol/L (ref 3.5–5.2)
Sodium: 140 mmol/L (ref 134–144)
Total Protein: 7.2 g/dL (ref 6.0–8.5)

## 2017-03-28 LAB — RPR: RPR Ser Ql: NONREACTIVE

## 2017-03-28 LAB — CBC WITH DIFFERENTIAL/PLATELET
Basophils Absolute: 0 10*3/uL (ref 0.0–0.2)
Basos: 1 %
EOS (ABSOLUTE): 0.1 10*3/uL (ref 0.0–0.4)
Eos: 2 %
Hematocrit: 39 % (ref 34.0–46.6)
Hemoglobin: 12.5 g/dL (ref 11.1–15.9)
Immature Grans (Abs): 0 10*3/uL (ref 0.0–0.1)
Immature Granulocytes: 0 %
Lymphocytes Absolute: 1.7 10*3/uL (ref 0.7–3.1)
Lymphs: 22 %
MCH: 28 pg (ref 26.6–33.0)
MCHC: 32.1 g/dL (ref 31.5–35.7)
MCV: 87 fL (ref 79–97)
Monocytes Absolute: 0.3 10*3/uL (ref 0.1–0.9)
Monocytes: 5 %
Neutrophils Absolute: 5.4 10*3/uL (ref 1.4–7.0)
Neutrophils: 70 %
Platelets: 248 10*3/uL (ref 150–379)
RBC: 4.46 x10E6/uL (ref 3.77–5.28)
RDW: 13.6 % (ref 12.3–15.4)
WBC: 7.6 10*3/uL (ref 3.4–10.8)

## 2017-03-28 LAB — VITAMIN D 25 HYDROXY (VIT D DEFICIENCY, FRACTURES): Vit D, 25-Hydroxy: 11.3 ng/mL — ABNORMAL LOW (ref 30.0–100.0)

## 2017-03-28 LAB — VITAMIN B12: Vitamin B-12: 432 pg/mL (ref 232–1245)

## 2017-03-28 LAB — HIV ANTIBODY (ROUTINE TESTING W REFLEX): HIV Screen 4th Generation wRfx: NONREACTIVE

## 2017-03-28 LAB — ANA W/REFLEX IF POSITIVE: Anti Nuclear Antibody(ANA): NEGATIVE

## 2017-03-28 LAB — CK: Total CK: 56 U/L (ref 24–173)

## 2017-03-28 LAB — TSH: TSH: 0.83 u[IU]/mL (ref 0.450–4.500)

## 2017-03-28 LAB — C-REACTIVE PROTEIN: CRP: 13.3 mg/L — ABNORMAL HIGH (ref 0.0–4.9)

## 2017-03-28 NOTE — Telephone Encounter (Signed)
Please call patient laboratory evaluation showed decreased vitamin D 11, she should take over-the-counter vitamin D3 2000 units daily  Mild elevated C reactive protein 13 point 3, have ordered repeat ESR, C-reactive protein, she may come lab without appointment

## 2017-03-28 NOTE — Telephone Encounter (Signed)
Left message for a return call

## 2017-03-28 NOTE — Telephone Encounter (Signed)
Spoke to patient - she is aware of results.  She will start the recommended supplement and come in for repeat labs.

## 2017-04-13 ENCOUNTER — Ambulatory Visit (INDEPENDENT_AMBULATORY_CARE_PROVIDER_SITE_OTHER): Payer: Managed Care, Other (non HMO) | Admitting: Neurology

## 2017-04-13 DIAGNOSIS — R251 Tremor, unspecified: Secondary | ICD-10-CM

## 2017-04-13 DIAGNOSIS — R202 Paresthesia of skin: Secondary | ICD-10-CM

## 2017-04-13 DIAGNOSIS — M542 Cervicalgia: Secondary | ICD-10-CM

## 2017-04-13 DIAGNOSIS — G43109 Migraine with aura, not intractable, without status migrainosus: Secondary | ICD-10-CM

## 2017-04-13 DIAGNOSIS — R899 Unspecified abnormal finding in specimens from other organs, systems and tissues: Secondary | ICD-10-CM | POA: Diagnosis not present

## 2017-04-13 DIAGNOSIS — R200 Anesthesia of skin: Secondary | ICD-10-CM
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
## 2017-04-13 NOTE — Procedures (Signed)
Full Name: Deanna George Gender: Female MRN #: [id number] Date of Birth: Jan 24, 2066    Visit Date: 04/13/17 08:19 Age: 51 Years 0 Months Old Examining Physician: Marcial Pacas, MD  Referring Physician: Krista Blue, MD History: 51 years old female complains of diffuse body achy pain, pelvic pain  Summary of the test: Nerve conduction study: Left lower extremity sensory and motor studies were normal. Right upper extremity sensory and motor studies were normal.  Electromyography: Selected needle examination of left lower extremity muscles left lumbosacral paraspinals were normal;  selected needle examination of right upper extremity muscles and right cervical paraspinals were normal.  Conclusion:  This is a normal study. There is no electrodiagnostic evidence of large fiber peripheral neuropathy, left lumbosacral radiculopathy or right cervical radiculopathy.    ------------------------------- Marcial Pacas, M.D.  [facility] [place], [place] [address] Tel: [phone] Fax: [phone]        [facility]    Nerve / Sites Muscle Latency Ref. Amplitude Ref. Rel Amp Segments Distance Velocity Ref. Area    ms ms mV mV %  cm m/s m/s mVms  R Median - APB     Wrist APB 3.2 ?4.4 6.2 ?4.0 100 Wrist - APB 7   21.2     Upper arm APB 7.2  5.7  92.4 Upper arm - Wrist 22 56 ?49 17.6  R Ulnar - ADM     Wrist ADM 2.4 ?3.3 10.1 ?6.0 100 Wrist - ADM 7   38.5     B.Elbow ADM 5.7  10.1  99.5 B.Elbow - Wrist 17 52 ?49 38.5     A.Elbow ADM 7.7  9.7  95.8 A.Elbow - B.Elbow 10 51 ?49 37.4         A.Elbow - Wrist      L Peroneal - EDB     Ankle EDB 5.2 ?6.5 5.3 ?2.0 100 Ankle - EDB 9   20.1     Fib head EDB 12.0  4.3  80.9 Fib head - Ankle 31 45 ?44 19.1     Pop fossa EDB 22.3  0.1  3.09 Pop fossa - Fib head   ?44 0.3         Pop fossa - Ankle      L Tibial - AH     Ankle AH 4.3 ?5.8 10.8 ?4.0 100 Ankle - AH 9   38.4     Pop fossa AH 12.5  8.8  81.1 Pop fossa - Ankle 38 46 ?41  39.2             SNC    Nerve / Sites Rec. Site Peak Lat Ref.  Amp Ref. Segments Distance    ms ms V V  cm  L Sural - Ankle (Calf)     Calf Ankle 2.8 ?4.4 11 ?6 Calf - Ankle 14  L Superficial peroneal - Ankle     Lat leg Ankle 2.4 ?4.4 12 ?6 Lat leg - Ankle 14  R Median - Orthodromic (Dig II, Mid palm)     Dig II Wrist 2.7 ?3.4 35 ?10 Dig II - Wrist 13  R Ulnar - Orthodromic, (Dig V, Mid palm)     Dig V Wrist 2.3 ?3.1 11 ?5 Dig V - Wrist 48             F  Wave    Nerve F Lat Ref.   ms ms  L Tibial - AH 48.5 ?56.0  R Ulnar - ADM 28.3 ?32.0         EMG full       EMG Summary Table    Spontaneous MUAP Recruitment  Muscle IA Fib PSW Fasc Other Amp Dur. Poly Pattern  L. Tibialis anterior Normal None None None _______ Normal Normal Normal Normal  L. Gastrocnemius (Medial head) Normal None None None _______ Normal Normal Normal Normal  L. Peroneus longus Normal None None None _______ Normal Normal Normal Normal  L. Vastus lateralis Normal None None None _______ Normal Normal Normal Normal  L. Biceps femoris (short head) Normal None None None _______ Normal Normal Normal Normal  L. Gluteus medius Normal None None None _______ Normal Normal Normal Normal  L. Lumbar paraspinals (mid) Normal None None None _______ Normal Normal Normal Normal  L. Lumbar paraspinals (low) Normal None None None _______ Normal Normal Normal Normal  R. First dorsal interosseous Normal None None None _______ Normal Normal Normal Normal  R. Pronator teres Normal None None None _______ Normal Normal Normal Normal  R. Deltoid Normal None None None _______ Normal Normal Normal Normal  R. Biceps brachii Normal None None None _______ Normal Normal Normal Normal  R. Extensor digitorum communis Normal None None None _______ Normal Normal Normal Normal  R. Cervical paraspinals Normal None None None _______ Normal Normal Normal Normal

## 2017-04-14 LAB — SEDIMENTATION RATE: Sed Rate: 9 mm/hr (ref 0–32)

## 2017-04-14 LAB — IRON AND TIBC
Iron Saturation: 11 % — ABNORMAL LOW (ref 15–55)
Iron: 41 ug/dL (ref 27–159)
Total Iron Binding Capacity: 360 ug/dL (ref 250–450)
UIBC: 319 ug/dL (ref 131–425)

## 2017-04-14 LAB — FERRITIN: Ferritin: 31 ng/mL (ref 15–150)

## 2017-04-14 LAB — C-REACTIVE PROTEIN: CRP: 10.1 mg/L — ABNORMAL HIGH (ref 0.0–4.9)

## 2017-04-18 ENCOUNTER — Telehealth: Payer: Self-pay | Admitting: Neurology

## 2017-04-18 ENCOUNTER — Encounter: Payer: Self-pay | Admitting: *Deleted

## 2017-04-18 NOTE — Telephone Encounter (Signed)
Spoke to patient - letter has been written.  She would like it sent to her home address.  Placed in mail today.

## 2017-04-18 NOTE — Telephone Encounter (Signed)
Patient called office to see if she is able to get a letter for her graduation day on June 23rd.  Patient states she would like the letter to state she is unable to attend graduation due to on going medical care.  Patient has been chosen to be a speaker at graduation and is not comfortable speaking due to tremors also graduation is outside and patient will not be able to do outside weakness.  Please call

## 2017-04-22 ENCOUNTER — Other Ambulatory Visit: Payer: Self-pay | Admitting: Nurse Practitioner

## 2017-06-04 ENCOUNTER — Other Ambulatory Visit: Payer: Self-pay | Admitting: *Deleted

## 2017-06-04 MED ORDER — NORTRIPTYLINE HCL 25 MG PO CAPS: 50.0000 mg | ORAL_CAPSULE | Freq: Every day | ORAL | 3 refills | Status: DC

## 2017-06-04 NOTE — Telephone Encounter (Signed)
Received fax request to change nortriptyline to 90 day supply. Reordered

## 2017-06-11 ENCOUNTER — Telehealth: Payer: Self-pay | Admitting: Nurse Practitioner

## 2017-06-11 NOTE — Telephone Encounter (Signed)
New message   Pt states she was recently diagnosed with pneumonia and also states her blood pressure is harder to control as it fluctuates all the time. She states she would like to be seen sooner than September 4th by Cecille Rubin.

## 2017-06-11 NOTE — Telephone Encounter (Signed)
Lm to call back ./cy 

## 2017-06-12 NOTE — Telephone Encounter (Signed)
Spoke with patient who thanked me for calling her back because she tried to call back several times and could not get through. I apologized for the difficulty. Patient states she would like to be seen sooner than September for elevated BP. She states she feels fatigued a great deal of the time and she thinks this is related to her heart working harder than it should. She states she is currently being evaluated for possible lupus and/or Parkinson's. I asked what kind of BP readings she is getting and she reports readings consistently >599-774 mmHg systolic and 90 mmHg diastolic. I asked if she is currently on any steroids for her other symptoms and she denies. She states she has an appointment at 3 that she needs to go in for and asked if she could call me back later. I advised that I will review potential open appointments before 9/4, preferably with Truitt Merle, NP and I advised that I will call her back with that information before the end of the day. She verbalized understanding and agreement with plan.

## 2017-06-12 NOTE — Telephone Encounter (Signed)
Left message on machine for pt to contact the office Cecille Rubin will see the 3 rd week of august.  Will wait for pt to call back to move appt.

## 2017-06-13 ENCOUNTER — Other Ambulatory Visit: Payer: Self-pay | Admitting: *Deleted

## 2017-06-13 MED ORDER — NORTRIPTYLINE HCL 25 MG PO CAPS: 50.0000 mg | ORAL_CAPSULE | Freq: Every day | ORAL | 3 refills | Status: DC

## 2017-06-13 NOTE — Telephone Encounter (Signed)
Refill had been sent to [facility] on 06/04/17. Corrected and sent to [facility] per faxed request received.

## 2017-06-27 ENCOUNTER — Encounter: Payer: Self-pay | Admitting: Nurse Practitioner

## 2017-07-02 ENCOUNTER — Other Ambulatory Visit: Payer: Self-pay | Admitting: Physician Assistant

## 2017-07-12 ENCOUNTER — Encounter: Payer: Self-pay | Admitting: Neurology

## 2017-07-17 ENCOUNTER — Encounter: Payer: Self-pay | Admitting: Nurse Practitioner

## 2017-07-17 ENCOUNTER — Ambulatory Visit (INDEPENDENT_AMBULATORY_CARE_PROVIDER_SITE_OTHER): Payer: Managed Care, Other (non HMO) | Admitting: Nurse Practitioner

## 2017-07-17 ENCOUNTER — Ambulatory Visit: Payer: Managed Care, Other (non HMO) | Admitting: Neurology

## 2017-07-17 ENCOUNTER — Encounter (INDEPENDENT_AMBULATORY_CARE_PROVIDER_SITE_OTHER): Payer: Self-pay

## 2017-07-17 VITALS — BP 132/90 | HR 117 | Ht 64.0 in | Wt 211.1 lb

## 2017-07-17 DIAGNOSIS — I10 Essential (primary) hypertension: Secondary | ICD-10-CM | POA: Diagnosis not present

## 2017-07-17 DIAGNOSIS — R002 Palpitations: Secondary | ICD-10-CM

## 2017-07-17 MED ORDER — METOPROLOL TARTRATE 25 MG PO TABS: 25.0000 mg | ORAL_TABLET | Freq: Two times a day (BID) | ORAL | 6 refills | Status: DC

## 2017-07-17 NOTE — Patient Instructions (Addendum)
We will be checking the following labs today - NONE   Medication Instructions:    Continue with your current medicines. BUT  I am adding Lopressor 25 mg to take twice a day - this should help with your BP and your elevated HR.     Testing/Procedures To Be Arranged:  N/A  Follow-Up:   See me in about 6 to 8 weeks.     Other Special Instructions:   N/A    If you need a refill on your cardiac medications before your next appointment, please call your pharmacy.   Call the [place] office at [phone] if you have any questions, problems or concerns.
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
## 2017-07-21 ENCOUNTER — Other Ambulatory Visit: Payer: Self-pay | Admitting: Nurse Practitioner

## 2017-08-09 ENCOUNTER — Other Ambulatory Visit: Payer: Self-pay | Admitting: Nurse Practitioner

## 2017-08-10 ENCOUNTER — Emergency Department (INDEPENDENT_AMBULATORY_CARE_PROVIDER_SITE_OTHER)
Admission: EM | Admit: 2017-08-10 | Discharge: 2017-08-10 | Disposition: A | Discharge status: Home / Self Care | Payer: Managed Care, Other (non HMO) | Source: Home / Self Care | Attending: Family Medicine | Admitting: Family Medicine

## 2017-08-10 ENCOUNTER — Encounter: Payer: Self-pay | Admitting: Emergency Medicine

## 2017-08-10 ENCOUNTER — Emergency Department (INDEPENDENT_AMBULATORY_CARE_PROVIDER_SITE_OTHER): Payer: Managed Care, Other (non HMO)

## 2017-08-10 DIAGNOSIS — S90212A Contusion of left great toe with damage to nail, initial encounter: Secondary | ICD-10-CM

## 2017-08-10 DIAGNOSIS — Z23 Encounter for immunization: Secondary | ICD-10-CM

## 2017-08-10 DIAGNOSIS — S91202A Unspecified open wound of left great toe with damage to nail, initial encounter: Secondary | ICD-10-CM

## 2017-08-10 DIAGNOSIS — W2203XA Walked into furniture, initial encounter: Secondary | ICD-10-CM

## 2017-08-10 DIAGNOSIS — S91209A Unspecified open wound of unspecified toe(s) with damage to nail, initial encounter: Secondary | ICD-10-CM

## 2017-08-10 MED ORDER — KETOROLAC TROMETHAMINE 60 MG/2ML IM SOLN
60.0000 mg | Freq: Once | INTRAMUSCULAR | Status: AC
  Administered 2017-08-10: 60 mg via INTRAMUSCULAR

## 2017-08-10 MED ORDER — ONDANSETRON 4 MG PO TBDP
4.0000 mg | ORAL_TABLET | Freq: Once | ORAL | Status: AC
  Administered 2017-08-10: 4 mg via ORAL

## 2017-08-10 MED ORDER — HYDROCODONE-ACETAMINOPHEN 5-325 MG PO TABS: 1.0000 | ORAL_TABLET | Freq: Four times a day (QID) | ORAL | 0 refills | Status: DC | PRN

## 2017-08-10 MED ORDER — TETANUS-DIPHTH-ACELL PERTUSSIS 5-2.5-18.5 LF-MCG/0.5 IM SUSP
0.5000 mL | Freq: Once | INTRAMUSCULAR | Status: AC
  Administered 2017-08-10: 0.5 mL via INTRAMUSCULAR

## 2017-08-10 NOTE — Discharge Instructions (Signed)
Change dressing daily and apply Bacitracin ointment to toenail and toe.  Keep clean and dry.  Return for any signs of infection (or follow-up with family doctor):  Increasing redness, swelling, pain, heat, drainage, etc. Return in 10 days for suture removal.

## 2017-08-10 NOTE — ED Provider Notes (Signed)
[name]    CSN: [id number] Arrival date & time: 08/10/17  1442     History   Chief Complaint Chief Complaint  Patient presents with  . Toe Injury    large left    HPI Deanna George is a 51 y.o. female.   Patient bumped her left great toe while moving furniture, resulting in toenail avulsion.  She does not recall her last Tdap.   The history is provided by the patient.  Toe Pain  This is a new problem. The current episode started less than 1 hour ago. The problem occurs constantly. The problem has not changed since onset.The symptoms are aggravated by walking. Nothing relieves the symptoms. She has tried nothing for the symptoms.    Past Medical History:  Diagnosis Date  . Anal fissure   . Cervical disc disorder   . Clostridium difficile infection   . Dysrhythmia    tachycardia  . Esophageal stricture   . GERD (gastroesophageal reflux disease)   . Headache   . Hemorrhoids   . Hypertension   . IBS (irritable bowel syndrome)   . Interstitial cystitis   . Noncompliance    pt denies  . OA (osteoarthritis) of knee   . Palpitations   . Pneumonia   . 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 Situational stress   . Urinary tract infection     Patient Active Problem List   Diagnosis Date Noted  . Tremor 03/27/2017  . Numbness and tingling 02/23/2017  . 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 Migraine 08/13/2014  . Neck pain 08/13/2014  . Interstitial cystitis 01/24/2013  . Personal history of colonic polyps 01/24/2013  . HTN (hypertension) 09/05/2011  . CARCINOMA, SKIN, SQUAMOUS CELL 02/17/2009  . BLOOD IN STOOL 02/17/2009  . ABDOMINAL PAIN-LLQ 02/17/2009  . GERD 12/10/2008  . OTHER DYSPHAGIA 12/10/2008    Past Surgical History:  Procedure Laterality Date  . BILATERAL OOPHORECTOMY  01/2017   Laparoscopic at [facility] minimally invasive surgery department  . BREAST EXCISIONAL BIOPSY Right 1995  . CESAREAN SECTION  1998,2000   x2  .  CHOLECYSTECTOMY N/A 01/28/2013   Procedure: LAPAROSCOPIC CHOLECYSTECTOMY WITH INTRAOPERATIVE CHOLANGIOGRAM;  Surgeon: Earnstine Regal, MD;  Location: [facility];  Service: General;  Laterality: N/A;  . COLONOSCOPY    . Foot pin post fracture  09/2008   Left foot  . KNEE ARTHROSCOPY  2011    right  . LAPAROSCOPIC BILATERAL SALPINGECTOMY Bilateral 02/02/2015   Procedure: BILATERAL SALPINGECTOMY, ;  Surgeon: Anastasio Auerbach, MD;  Location: [facility];  Service: Gynecology;  Laterality: Bilateral;  . Laparoscopic surgery  01/2007   uterus and ovary x 2  . LAPAROSCOPIC VAGINAL HYSTERECTOMY     02/2008  . LAPAROSCOPY N/A 02/02/2015   Procedure: LAPAROSCOPY DIAGNOSTIC, FULGERATION ENDOMETREOSIS, EXCISION RIGHT OVARIAN CYST, LYSIS OF ADHESIONS, EXCISION VULVAR AND VAGINAL CYST ;  Surgeon: Anastasio Auerbach, MD;  Location: [facility];  Service: Gynecology;  Laterality: N/A;  . MOLE REMOVAL Right 2010  . US ECHOCARDIOGRAPHY  07/31/2008   EF 55-60% with mild LVH    OB History    Gravida Para Term Preterm AB Living   3 2     1 2    SAB TAB Ectopic Multiple Live Births     1             Home Medications    Prior to Admission medications   Medication Sig Start Date End Date Taking? Authorizing  Provider  Cholecalciferol (VITAMIN D3) 2000 units TABS Take 2,000 Units by mouth daily.    [provider]  cyclobenzaprine (FLEXERIL) 10 MG tablet Take 1 tablet (10 mg total) by mouth 3 (three) times daily as needed. 07/19/16   Dennie Bible, NP  diazepam (VALIUM) 5 MG tablet 1/2-1 TABLET AS NEEDED EVERY 6 HOURS AS NEEDED FOR MUSCLE SPASM ORALLY 30 DAYS 06/23/16   [provider]  DULoxetine (CYMBALTA) 60 MG capsule Take 1 capsule (60 mg total) by mouth daily. 03/27/17 03/27/18  Marcial Pacas, MD  HYDROcodone-acetaminophen (NORCO/VICODIN) 5-325 MG tablet Take 1 tablet by mouth every 6 (six) hours as needed for moderate pain. 08/10/17   Kandra Nicolas, MD  hyoscyamine (LEVSIN/SL) 0.125 MG SL tablet  Place 1 tablet (0.125 mg total) under the tongue every 6 (six) hours as needed. 06/20/16   Levin Erp, PA  ibuprofen (ADVIL,MOTRIN) 800 MG tablet Take 1 tablet (800 mg total) by mouth every 8 (eight) hours as needed. 02/05/15   Fontaine, Belinda Block, MD  losartan (COZAAR) 50 MG tablet TAKE 1 TABLET BY MOUTH TWICE A DAY 07/23/17   Burtis Junes, NP  metoprolol tartrate (LOPRESSOR) 25 MG tablet Take 1 tablet (25 mg total) by mouth 2 (two) times daily. 07/17/17 10/15/17  Burtis Junes, NP  nortriptyline (PAMELOR) 25 MG capsule Take 2 capsules (50 mg total) by mouth at bedtime. 06/13/17   Marcial Pacas, MD  omeprazole (PRILOSEC) 40 MG capsule TAKE 1 CAPSULE (40 MG TOTAL) BY MOUTH 2 (TWO) TIMES DAILY. 07/02/17   Levin Erp, PA  ondansetron (ZOFRAN-ODT) 4 MG disintegrating tablet Take 2 tablets (8 mg total) by mouth every 6 (six) hours as needed. for nausea 03/27/17   Marcial Pacas, MD  promethazine (PHENERGAN) 25 MG tablet Take 1 tablet (25 mg total) by mouth every 6 (six) hours as needed for nausea or vomiting. 03/27/17   Marcial Pacas, MD  rizatriptan (MAXALT-MLT) 10 MG disintegrating tablet TAKE 1 TABLET (10 MG TOTAL) BY MOUTH AS NEEDED. MAY REPEAT IN 2 HOURS IF NEEDED 08/09/17   Dennie Bible, NP  SUMAtriptan (IMITREX) 6 MG/0.5ML SOLN injection Inject 0.5 mLs (6 mg total) into the skin as needed for migraine. May repeat in 2 hours if headache persists or recurs. 03/27/17   Marcial Pacas, MD  traMADol Veatrice Bourbon) 50 MG tablet TAKE ONE TABLET BY MOUTH AS NEEDED FOR HEADACHE 12/19/16   Dennie Bible, NP    Family History Family History  Problem Relation Age of Onset  . Adopted: Yes  . Colon cancer Neg Hx   . Pancreatic cancer Neg Hx   . Stomach cancer Neg Hx   . Rectal cancer Neg Hx     Social History Social History  Substance Use Topics  . Smoking status: Never Smoker  . Smokeless tobacco: Never Used  . Alcohol use Yes     Comment: Rare     Allergies   Patient has no known  allergies.   Review of Systems Review of Systems  All other systems reviewed and are negative.    Physical Exam Triage Vital Signs ED Triage Vitals  Enc Vitals Group     BP 08/10/17 1500 136/84     Pulse Rate 08/10/17 1500 (!) 101     Resp 08/10/17 1500 18     Temp 08/10/17 1500 98.2 F (36.8 C)     Temp Source 08/10/17 1500 Oral     SpO2 08/10/17 1500 98 %  Weight 08/10/17 1526 198 lb (89.8 kg)     Height 08/10/17 1526 5\' 4"  (1.626 m)     Head Circumference --      Peak Flow --      Pain Score 08/10/17 1526 9     Pain Loc --      Pain Edu? --      Excl. in [place]? --    No data found.   Updated Vital Signs BP 136/84 (BP Location: Right Arm)   Pulse (!) 101   Temp 98.2 F (36.8 C) (Oral)   Resp 18   Ht 5\' 4"  (1.626 m)   Wt 198 lb (89.8 kg)   SpO2 98%   BMI 33.99 kg/m   Visual Acuity Right Eye Distance:   Left Eye Distance:   Bilateral Distance:    Right Eye Near:   Left Eye Near:    Bilateral Near:     Physical Exam  Constitutional: She appears well-developed and well-nourished. No distress.  HENT:  Head: Normocephalic.  Right Ear: External ear normal.  Left Ear: External ear normal.  Nose: Nose normal.  Eyes: Pupils are equal, round, and reactive to light. Conjunctivae are normal.  Cardiovascular:  Heart rate 101  Pulmonary/Chest: Effort normal.  Musculoskeletal:       Left foot: There is decreased range of motion, tenderness and bony tenderness. There is no swelling and normal capillary refill.       Feet:  Left great toenail partly avulsed from nailbed.  Toe tender to palpation with decreased range of motion.  No deformity.  Cap refill intact.  Neurological: She is alert.  Skin: Skin is warm and dry.  Nursing note and vitals reviewed.    UC Treatments / Results  Labs (all labs ordered are listed, but only abnormal results are displayed) Labs Reviewed - No data to display  EKG  EKG Interpretation None       Radiology Dg Toe  Great Left  Result Date: 08/10/2017 CLINICAL DATA:  Great toe injury, nail avulsion. EXAM: LEFT GREAT TOE COMPARISON:  None. FINDINGS: There is no evidence of fracture or dislocation. Metatarsus screw seen on single view, not within first metatarsus. There is no evidence of arthropathy or other focal bone abnormality. Subungual gas compatible with nail avulsion. IMPRESSION: No acute osseous process. Electronically Signed   By: Elon Alas M.D.   On: 08/10/2017 16:10    Procedures Procedures Reinsert avulsed toenail: Discussed benefits and risks of procedure and verbal consent obtained. Using sterile technique and digital 0.5% bupivacaine without epinephrine, cleansed distal toe with Betadine followed by copious lavage with normal saline.  Wound carefully inspected for debris and foreign bodies; none found.  No nailbed laceration present.  Carefully re-inserted base of avulsed toenail into its proper anatomic position, and secured it with #2, 5-0 interrupted nylon sutures at its base.  Bacitracin and non-stick sterile dressing applied.  Wound precautions explained to patient.  Return for suture removal in 10 days.   Medications Ordered in UC Medications  Tdap (BOOSTRIX) injection 0.5 mL (0.5 mLs Intramuscular Given 08/10/17 1529)  ketorolac (TORADOL) injection 60 mg (60 mg Intramuscular Given 08/10/17 1500)  ondansetron (ZOFRAN-ODT) disintegrating tablet 4 mg (4 mg Oral Given 08/10/17 1500)     Initial Impression / Assessment and Plan / UC Course  I have reviewed the triage vital signs and the nursing notes.  Pertinent labs & imaging results that were available during my care of the patient were reviewed by me  and considered in my medical decision making (see chart for details).    Administered Tdap  Administered Zofran ODT 4mg  PO  Administered Toradol 60mg  IM  Rx for Lortab #12, no refill Controlled Substance Prescriptions I have consulted the [place] Controlled Substances Registry for  this patient, and feel the risk/benefit ratio today is favorable for proceeding with this prescription for a controlled substance.  Dispensed post-op shoe.  Elevate foot.  May apply ice pack for 1 to 2 days. Change dressing daily and apply Bacitracin ointment to toenail and toe.  Keep clean and dry.  Return for any signs of infection (or follow-up with family doctor):  Increasing redness, swelling, pain, heat, drainage, etc. Return in 10 days for suture removal.      Final Clinical Impressions(s) / UC Diagnoses   Final diagnoses:  Contusion of left great toe with damage to nail, initial encounter  Avulsion of toenail of left foot    New Prescriptions Current Discharge Medication List           Kandra Nicolas, MD 08/10/17 1649

## 2017-08-10 NOTE — ED Triage Notes (Signed)
Patient just stubbed left large toe while moving furniture and nail is almost off. She denies Tdap within past 6-10 years. Bleeding is stable.

## 2017-08-12 ENCOUNTER — Telehealth: Payer: Self-pay | Admitting: Emergency Medicine

## 2017-08-12 NOTE — Telephone Encounter (Signed)
Courtesy call to patient. LM and advised to CB with questions or concerns.  

## 2017-08-13 ENCOUNTER — Telehealth: Payer: Self-pay | Admitting: Emergency Medicine

## 2017-08-13 NOTE — Telephone Encounter (Signed)
Patient called to question normalcy of her injured toe/toenail, which was repaired with toenail re-attached on 08/10/17. It is tender and turning colors; she is taking her oral antibiotics; does not feel that it is infected but wonders if it is doomed to slough off. I encouraged her to come this evening for an evaluation and she states she probably will come tomorrow since she does not have transportation tonight. I advised E.Gerarda Fraction of the conversation.

## 2017-08-15 ENCOUNTER — Emergency Department (INDEPENDENT_AMBULATORY_CARE_PROVIDER_SITE_OTHER)
Admission: EM | Admit: 2017-08-15 | Discharge: 2017-08-15 | Disposition: A | Discharge status: Home / Self Care | Payer: Managed Care, Other (non HMO) | Source: Home / Self Care | Attending: Emergency Medicine | Admitting: Emergency Medicine

## 2017-08-15 ENCOUNTER — Encounter: Payer: Self-pay | Admitting: *Deleted

## 2017-08-15 DIAGNOSIS — M79675 Pain in left toe(s): Secondary | ICD-10-CM | POA: Diagnosis not present

## 2017-08-15 DIAGNOSIS — L03032 Cellulitis of left toe: Secondary | ICD-10-CM

## 2017-08-15 MED ORDER — HYDROMORPHONE HCL 2 MG PO TABS: 2.0000 mg | ORAL_TABLET | Freq: Four times a day (QID) | ORAL | 0 refills | Status: DC | PRN

## 2017-08-15 MED ORDER — CEPHALEXIN 500 MG PO CAPS: 500.0000 mg | ORAL_CAPSULE | Freq: Three times a day (TID) | ORAL | 0 refills | Status: DC

## 2017-08-15 NOTE — ED Triage Notes (Signed)
Pt is here today for recheck of her LT great toe. She reports having green/yellow and clear drainage.

## 2017-08-15 NOTE — Discharge Instructions (Signed)
Cleaned toe wound every day with antibiotic soap and water. Redress with Polysporin ointment and nonstick dressing. Return in 5 days to recheck toe and likely will remove 2 sutures. Return sooner if significant drainage or worsening swelling or fever. For your other chronic problems, and chronic pain problems, follow-up with your doctors within one week.

## 2017-08-19 NOTE — ED Provider Notes (Signed)
[name]    CSN: [id number] Arrival date & time: 08/15/17  1619      History   Chief Complaint Chief Complaint  Patient presents with  . Toe Pain    HPI Deanna George is a 51 y.o. female.   HPI Here for recheck of injury left great toe, because she feels she has a mild infection left great toe, and she states she is also here requesting small amount of pain med for this acute pain. Left great toe pain is throbbing, is 5 out of 10 at rest, 8 or 9 out of 10 when touching or moving it or trying to walk.  To review, on 08/10/2017, was seen here in urgent care by Dr Assunta Found for acute injury,contusion left great toe, avulsion of left great toenail, and Dr. Assunta Found placed 2 sutures to secure the toenail. She states there may have been scant light yellow drainage, but no current drainage.No bleeding. She denies fever or chills or nausea or vomiting or abdominal or chest pain.  X-ray report from last visit: Result Date: 08/10/2017 CLINICAL DATA:  Great toe injury, nail avulsion. EXAM: LEFT GREAT TOE COMPARISON:  None. FINDINGS: There is no evidence of fracture or dislocation. Metatarsus screw seen on single view, not within first metatarsus. There is no evidence of arthropathy or other focal bone abnormality. Subungual gas compatible with nail avulsion. IMPRESSION: No acute osseous process. Electronically Signed   By: Elon Alas M.D.   On: 08/10/2017 16:1  Past Medical History:  Diagnosis Date  . Anal fissure   . Cervical disc disorder   . Clostridium difficile infection   . Dysrhythmia    tachycardia  . Esophageal stricture   . GERD (gastroesophageal reflux disease)   . Headache   . Hemorrhoids   . Hypertension   . IBS (irritable bowel syndrome)   . Interstitial cystitis   . Noncompliance    pt denies  . OA (osteoarthritis) of knee   . Palpitations   . Pneumonia   . 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 Situational stress   . Urinary tract infection      Patient Active Problem List   Diagnosis Date Noted  . Tremor 03/27/2017  . Numbness and tingling 02/23/2017  . 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 Migraine 08/13/2014  . Neck pain 08/13/2014  . Interstitial cystitis 01/24/2013  . Personal history of colonic polyps 01/24/2013  . HTN (hypertension) 09/05/2011  . CARCINOMA, SKIN, SQUAMOUS CELL 02/17/2009  . BLOOD IN STOOL 02/17/2009  . ABDOMINAL PAIN-LLQ 02/17/2009  . GERD 12/10/2008  . OTHER DYSPHAGIA 12/10/2008    Past Surgical History:  Procedure Laterality Date  . BILATERAL OOPHORECTOMY  01/2017   Laparoscopic at [facility] minimally invasive surgery department  . BREAST EXCISIONAL BIOPSY Right 1995  . CESAREAN SECTION  1998,2000   x2  . CHOLECYSTECTOMY N/A 01/28/2013   Procedure: LAPAROSCOPIC CHOLECYSTECTOMY WITH INTRAOPERATIVE CHOLANGIOGRAM;  Surgeon: Earnstine Regal, MD;  Location: [facility];  Service: General;  Laterality: N/A;  . COLONOSCOPY    . Foot pin post fracture  09/2008   Left foot  . KNEE ARTHROSCOPY  2011    right  . LAPAROSCOPIC BILATERAL SALPINGECTOMY Bilateral 02/02/2015   Procedure: BILATERAL SALPINGECTOMY, ;  Surgeon: Anastasio Auerbach, MD;  Location: [facility];  Service: Gynecology;  Laterality: Bilateral;  . Laparoscopic surgery  01/2007   uterus and ovary x 2  . LAPAROSCOPIC VAGINAL HYSTERECTOMY     02/2008  .  LAPAROSCOPY N/A 02/02/2015   Procedure: LAPAROSCOPY DIAGNOSTIC, FULGERATION ENDOMETREOSIS, EXCISION RIGHT OVARIAN CYST, LYSIS OF ADHESIONS, EXCISION VULVAR AND VAGINAL CYST ;  Surgeon: Anastasio Auerbach, MD;  Location: [facility];  Service: Gynecology;  Laterality: N/A;  . MOLE REMOVAL Right 2010  . US ECHOCARDIOGRAPHY  07/31/2008   EF 55-60% with mild LVH    OB History    Gravida Para Term Preterm AB Living   3 2     1 2    SAB TAB Ectopic Multiple Live Births     1             Home Medications    Prior to Admission medications   Medication Sig Start Date End Date Taking? Authorizing Provider    cephALEXin (KEFLEX) 500 MG capsule Take 1 capsule (500 mg total) by mouth 3 (three) times daily. 08/15/17   Jacqulyn Cane, MD  Cholecalciferol (VITAMIN D3) 2000 units TABS Take 2,000 Units by mouth daily.    [provider]  cyclobenzaprine (FLEXERIL) 10 MG tablet Take 1 tablet (10 mg total) by mouth 3 (three) times daily as needed. 07/19/16   Dennie Bible, NP  diazepam (VALIUM) 5 MG tablet 1/2-1 TABLET AS NEEDED EVERY 6 HOURS AS NEEDED FOR MUSCLE SPASM ORALLY 30 DAYS 06/23/16   [provider]  DULoxetine (CYMBALTA) 60 MG capsule Take 1 capsule (60 mg total) by mouth daily. 03/27/17 03/27/18  Marcial Pacas, MD  HYDROcodone-acetaminophen (NORCO/VICODIN) 5-325 MG tablet Take 1 tablet by mouth every 6 (six) hours as needed for moderate pain. 08/10/17   Kandra Nicolas, MD  HYDROmorphone (DILAUDID) 2 MG tablet Take 1 tablet (2 mg total) by mouth every 6 (six) hours as needed for severe pain. 08/15/17   Jacqulyn Cane, MD  hyoscyamine (LEVSIN/SL) 0.125 MG SL tablet Place 1 tablet (0.125 mg total) under the tongue every 6 (six) hours as needed. 06/20/16   Levin Erp, PA  ibuprofen (ADVIL,MOTRIN) 800 MG tablet Take 1 tablet (800 mg total) by mouth every 8 (eight) hours as needed. 02/05/15   Fontaine, Belinda Block, MD  losartan (COZAAR) 50 MG tablet TAKE 1 TABLET BY MOUTH TWICE A DAY 07/23/17   Burtis Junes, NP  metoprolol tartrate (LOPRESSOR) 25 MG tablet Take 1 tablet (25 mg total) by mouth 2 (two) times daily. 07/17/17 10/15/17  Burtis Junes, NP  nortriptyline (PAMELOR) 25 MG capsule Take 2 capsules (50 mg total) by mouth at bedtime. 06/13/17   Marcial Pacas, MD  omeprazole (PRILOSEC) 40 MG capsule TAKE 1 CAPSULE (40 MG TOTAL) BY MOUTH 2 (TWO) TIMES DAILY. 07/02/17   Levin Erp, PA  ondansetron (ZOFRAN-ODT) 4 MG disintegrating tablet Take 2 tablets (8 mg total) by mouth every 6 (six) hours as needed. for nausea 03/27/17   Marcial Pacas, MD  promethazine (PHENERGAN) 25 MG  tablet Take 1 tablet (25 mg total) by mouth every 6 (six) hours as needed for nausea or vomiting. 03/27/17   Marcial Pacas, MD  rizatriptan (MAXALT-MLT) 10 MG disintegrating tablet TAKE 1 TABLET (10 MG TOTAL) BY MOUTH AS NEEDED. MAY REPEAT IN 2 HOURS IF NEEDED 08/09/17   Dennie Bible, NP  SUMAtriptan (IMITREX) 6 MG/0.5ML SOLN injection Inject 0.5 mLs (6 mg total) into the skin as needed for migraine. May repeat in 2 hours if headache persists or recurs. 03/27/17   Marcial Pacas, MD  traMADol Veatrice Bourbon) 50 MG tablet TAKE ONE TABLET BY MOUTH AS NEEDED FOR HEADACHE 12/19/16   Evlyn Courier  Hoyle Sauer, NP    Family History Family History  Problem Relation Age of Onset  . Adopted: Yes  . Colon cancer Neg Hx   . Pancreatic cancer Neg Hx   . Stomach cancer Neg Hx   . Rectal cancer Neg Hx     Social History Social History  Substance Use Topics  . Smoking status: Never Smoker  . Smokeless tobacco: Never Used  . Alcohol use Yes     Comment: Rare     Allergies   Patient has no known allergies.   Review of Systems Review of Systems  Constitutional: Negative for fever.  HENT: Negative.   Respiratory: Negative for chest tightness and shortness of breath.   Cardiovascular: Negative for chest pain.  Gastrointestinal: Negative for abdominal pain, nausea and vomiting.  Genitourinary: Negative for difficulty urinating.  Neurological: Negative for seizures, syncope and light-headedness.  Psychiatric/Behavioral: Negative for confusion.     Physical Exam Triage Vital Signs ED Triage Vitals [08/15/17 1631]  Enc Vitals Group     BP (!) 146/89     Pulse Rate (!) 118     Resp 18     Temp 98.2 F (36.8 C)     Temp Source Oral     SpO2 98 %     Weight 200 lb (90.7 kg)     Height 5\' 4"  (1.626 m)     Head Circumference      Peak Flow      Pain Score 8     Pain Loc      Pain Edu?      Excl. in [place]?    No data found.   Updated Vital Signs BP (!) 146/89 (BP Location: Left Arm)   Pulse (!)  118   Temp 98.2 F (36.8 C) (Oral)   Resp 18   Ht 5\' 4"  (1.626 m)   Wt 200 lb (90.7 kg)   SpO2 98%   BMI 34.33 kg/m  Pulse rechecked, 92, regular.   Physical Exam  Constitutional: She appears well-developed and well-nourished. No distress.  Cardiovascular: Normal rate.   Pulse 92 by me, regular  Pulmonary/Chest: Effort normal.  Abdominal: She exhibits no distension.  Nursing note and vitals reviewed.  Left great toe: Nail intact, secured by 2 sutures. No sign of infection around the 2 sutures. Soft tissue of cuticle, mild redness, minimal warmth, but exquisitely tender to palpation. No fluctuance or drainage or red streaks. Neurovascular distally intact. Capillary refill normal.  UC Treatments / Results  Labs (all labs ordered are listed, but only abnormal results are displayed) Labs Reviewed - No data to display  EKG  EKG Interpretation None       Radiology No results found.  Procedures Procedures (including critical care time)  Medications Ordered in UC Medications - No data to display   Initial Impression / Assessment and Plan / UC Course  I have reviewed the triage vital signs and the nursing notes.  Pertinent labs & imaging results that were available during my care of the patient were reviewed by me and considered in my medical decision making (see chart for details).    Final Clinical Impressions(s) / UC Diagnoses   Final diagnoses:  Pain of toe of left foot  Cellulitis of great toe of left foot   1) For acute pain with this injury, we had a long discussion regarding risks benefits alternatives of pain meds. She volunteered that she has had some strong pain med prescriptions for some chronic  and recurrent pain, but she states she does not abuse prescriptions. She is taking chronic tramadol 50 mg daily at bedtime, and also hydrocodone/acetaminophen prn. She states that she is still having pain for this acute toe problem, and she requests small  prescription for Dilauded 2 mg.-I consulted the [place] controlled substance registry, and there are some risks with prescribing more Dilauded, but I feel in this acute situation, the benefits of this small prescription outweigh the risks. PRECAUTIONS DISCUSSED AT LENGTH WITH PATIENT.-advised not to take the Dilauded with tramadol or hydrocodone.  WE WILL FAX THIS NOTE TO DR. CANDACE SMITH, HER PCP AT [facility]), As Dr. Tamala Julian can coordinate her chronic pain med use or refer to chronic pain specialist.   2) Clinically, she has mild cellulitis left great toe but no sign of abscess or osteomyelitis. For now, I'm leaving the 2 sutures in which are securing the avulsed nail. There is no drainage to send for culture. I reviewed daily wound care and dressing. Today, we cleansed left great toe with Hibiclens and applied nonstick dressing. Cephalexin prescribed. Precautions and red flags discussed. Return here to urgent care in 3-4 days to remove sutures. If any red flags, advised to go to emergency room or refer to orthopedist or podiatrist. She voiced understanding.  Over 25 minutes spent, greater than 50% of the time spent for counseling and coordination of care.   New Prescriptions Discharge Medication List as of 08/15/2017  5:24 PM    START taking these medications   Details  cephALEXin (KEFLEX) 500 MG capsule Take 1 capsule (500 mg total) by mouth 3 (three) times daily., Starting Wed 08/15/2017, Print    HYDROmorphone (DILAUDID) 2 MG tablet Take 1 tablet (2 mg total) by mouth every 6 (six) hours as needed for severe pain., Starting Wed 08/15/2017, Print      #8 (eight) tablets of 2 mg Dilauded prescribed- risks and precautions discussed at length with patient.   Controlled Substance Prescriptions [place] Controlled Substance Registry consulted? Yes, I have consulted the [place] Controlled Substances Registry for this patient, and feel the risk/benefit ratio today  is favorable for proceeding with this prescription for a controlled substance.   Jacqulyn Cane, MD 08/19/17 [phone]

## 2017-08-21 NOTE — ED Notes (Signed)
Office notes faxed to [name], patient's PCP per Dr. Carolyne Fiscal request and concern.

## 2017-08-24 ENCOUNTER — Emergency Department (INDEPENDENT_AMBULATORY_CARE_PROVIDER_SITE_OTHER)
Admission: EM | Admit: 2017-08-24 | Discharge: 2017-08-24 | Disposition: A | Discharge status: Home / Self Care | Payer: Managed Care, Other (non HMO) | Source: Home / Self Care | Attending: Emergency Medicine | Admitting: Emergency Medicine

## 2017-08-24 ENCOUNTER — Encounter: Payer: Self-pay | Admitting: Emergency Medicine

## 2017-08-24 DIAGNOSIS — Z4802 Encounter for removal of sutures: Secondary | ICD-10-CM

## 2017-08-24 NOTE — ED Triage Notes (Signed)
Pt here for suture removal of left big toe. No concerns

## 2017-08-24 NOTE — ED Provider Notes (Signed)
[name]    CSN: [id number] Arrival date & time: 08/24/17  1217     History   Chief Complaint Chief Complaint  Patient presents with  . Suture / Staple Removal    HPI Deanna George is a 51 y.o. female.   HPI Here for suture removal of left great toenail. Had 2 sutures placed by Dr. Assunta Found 14 days ago. She was seen once in the interim for cellulitis and pain left great toenail, but she feels that's completely resolved. Denies left toe pain or drainage or swelling or redness. No fever or chills   Past Medical History:  Diagnosis Date  . Anal fissure   . Cervical disc disorder   . Clostridium difficile infection   . Dysrhythmia    tachycardia  . Esophageal stricture   . GERD (gastroesophageal reflux disease)   . Headache   . Hemorrhoids   . Hypertension   . IBS (irritable bowel syndrome)   . Interstitial cystitis   . Noncompliance    pt denies  . OA (osteoarthritis) of knee   . Palpitations   . Pneumonia   . 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 Situational stress   . Urinary tract infection     Patient Active Problem List   Diagnosis Date Noted  . Tremor 03/27/2017  . Numbness and tingling 02/23/2017  . 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 Migraine 08/13/2014  . Neck pain 08/13/2014  . Interstitial cystitis 01/24/2013  . Personal history of colonic polyps 01/24/2013  . HTN (hypertension) 09/05/2011  . CARCINOMA, SKIN, SQUAMOUS CELL 02/17/2009  . BLOOD IN STOOL 02/17/2009  . ABDOMINAL PAIN-LLQ 02/17/2009  . GERD 12/10/2008  . OTHER DYSPHAGIA 12/10/2008    Past Surgical History:  Procedure Laterality Date  . BILATERAL OOPHORECTOMY  01/2017   Laparoscopic at [facility] minimally invasive surgery department  . BREAST EXCISIONAL BIOPSY Right 1995  . CESAREAN SECTION  1998,2000   x2  . CHOLECYSTECTOMY N/A 01/28/2013   Procedure: LAPAROSCOPIC CHOLECYSTECTOMY WITH INTRAOPERATIVE CHOLANGIOGRAM;  Surgeon: Earnstine Regal, MD;  Location: [facility];  Service: General;  Laterality: N/A;  . COLONOSCOPY    . Foot pin post fracture  09/2008   Left foot  . KNEE ARTHROSCOPY  2011    right  . LAPAROSCOPIC BILATERAL SALPINGECTOMY Bilateral 02/02/2015   Procedure: BILATERAL SALPINGECTOMY, ;  Surgeon: Anastasio Auerbach, MD;  Location: [facility];  Service: Gynecology;  Laterality: Bilateral;  . Laparoscopic surgery  01/2007   uterus and ovary x 2  . LAPAROSCOPIC VAGINAL HYSTERECTOMY     02/2008  . LAPAROSCOPY N/A 02/02/2015   Procedure: LAPAROSCOPY DIAGNOSTIC, FULGERATION ENDOMETREOSIS, EXCISION RIGHT OVARIAN CYST, LYSIS OF ADHESIONS, EXCISION VULVAR AND VAGINAL CYST ;  Surgeon: Anastasio Auerbach, MD;  Location: [facility];  Service: Gynecology;  Laterality: N/A;  . MOLE REMOVAL Right 2010  . US ECHOCARDIOGRAPHY  07/31/2008   EF 55-60% with mild LVH    OB History    Gravida Para Term Preterm AB Living   3 2     1 2    SAB TAB Ectopic Multiple Live Births     1             Home Medications    Prior to Admission medications   Medication Sig Start Date End Date Taking? Authorizing Provider  cephALEXin (KEFLEX) 500 MG capsule Take 1 capsule (500 mg total) by mouth 3 (three) times daily. 08/15/17   Jacqulyn Cane, MD  Cholecalciferol (VITAMIN D3) 2000 units TABS Take 2,000 Units by mouth daily.    [provider]  cyclobenzaprine (FLEXERIL) 10 MG tablet Take 1 tablet (10 mg total) by mouth 3 (three) times daily as needed. 07/19/16   Dennie Bible, NP  diazepam (VALIUM) 5 MG tablet 1/2-1 TABLET AS NEEDED EVERY 6 HOURS AS NEEDED FOR MUSCLE SPASM ORALLY 30 DAYS 06/23/16   [provider]  DULoxetine (CYMBALTA) 60 MG capsule Take 1 capsule (60 mg total) by mouth daily. 03/27/17 03/27/18  Marcial Pacas, MD  HYDROcodone-acetaminophen (NORCO/VICODIN) 5-325 MG tablet Take 1 tablet by mouth every 6 (six) hours as needed for moderate pain. 08/10/17   Kandra Nicolas, MD  HYDROmorphone (DILAUDID) 2 MG tablet Take 1 tablet (2 mg total) by  mouth every 6 (six) hours as needed for severe pain. 08/15/17   Jacqulyn Cane, MD  hyoscyamine (LEVSIN/SL) 0.125 MG SL tablet Place 1 tablet (0.125 mg total) under the tongue every 6 (six) hours as needed. 06/20/16   Levin Erp, PA  ibuprofen (ADVIL,MOTRIN) 800 MG tablet Take 1 tablet (800 mg total) by mouth every 8 (eight) hours as needed. 02/05/15   Fontaine, Belinda Block, MD  losartan (COZAAR) 50 MG tablet TAKE 1 TABLET BY MOUTH TWICE A DAY 07/23/17   Burtis Junes, NP  metoprolol tartrate (LOPRESSOR) 25 MG tablet Take 1 tablet (25 mg total) by mouth 2 (two) times daily. 07/17/17 10/15/17  Burtis Junes, NP  nortriptyline (PAMELOR) 25 MG capsule Take 2 capsules (50 mg total) by mouth at bedtime. 06/13/17   Marcial Pacas, MD  omeprazole (PRILOSEC) 40 MG capsule TAKE 1 CAPSULE (40 MG TOTAL) BY MOUTH 2 (TWO) TIMES DAILY. 07/02/17   Levin Erp, PA  ondansetron (ZOFRAN-ODT) 4 MG disintegrating tablet Take 2 tablets (8 mg total) by mouth every 6 (six) hours as needed. for nausea 03/27/17   Marcial Pacas, MD  promethazine (PHENERGAN) 25 MG tablet Take 1 tablet (25 mg total) by mouth every 6 (six) hours as needed for nausea or vomiting. 03/27/17   Marcial Pacas, MD  rizatriptan (MAXALT-MLT) 10 MG disintegrating tablet TAKE 1 TABLET (10 MG TOTAL) BY MOUTH AS NEEDED. MAY REPEAT IN 2 HOURS IF NEEDED 08/09/17   Dennie Bible, NP  SUMAtriptan (IMITREX) 6 MG/0.5ML SOLN injection Inject 0.5 mLs (6 mg total) into the skin as needed for migraine. May repeat in 2 hours if headache persists or recurs. 03/27/17   Marcial Pacas, MD  traMADol Veatrice Bourbon) 50 MG tablet TAKE ONE TABLET BY MOUTH AS NEEDED FOR HEADACHE 12/19/16   Dennie Bible, NP    Family History Family History  Problem Relation Age of Onset  . Adopted: Yes  . Colon cancer Neg Hx   . Pancreatic cancer Neg Hx   . Stomach cancer Neg Hx   . Rectal cancer Neg Hx     Social History Social History  Substance Use Topics  . Smoking status:  Never Smoker  . Smokeless tobacco: Never Used  . Alcohol use Yes     Comment: Rare     Allergies   Patient has no known allergies.   Review of Systems Review of Systems   Physical Exam Triage Vital Signs ED Triage Vitals  Enc Vitals Group     BP --      Pulse --      Resp --      Temp 08/24/17 1237 98.6 F (37 C)     Temp Source  08/24/17 1237 Oral     SpO2 --      Weight --      Height --      Head Circumference --      Peak Flow --      Pain Score 08/24/17 1238 0     Pain Loc --      Pain Edu? --      Excl. in [place]? --    No data found.   Updated Vital Signs Temp 98.6 F (37 C) (Oral)   Visual Acuity Right Eye Distance:   Left Eye Distance:   Bilateral Distance:    Right Eye Near:   Left Eye Near:    Bilateral Near:     Physical Exam Left great toe: No redness or swelling or tenderness or sign of infection. The toenail is secure, 2 sutures are in place. Toenail bed at the cuticle appears healthy and starting to granulate in and grow normally. Neurovascular distally intact.  UC Treatments / Results  Labs (all labs ordered are listed, but only abnormal results are displayed) Labs Reviewed - No data to display  EKG  EKG Interpretation None       Radiology No results found.  Procedures Procedures (including critical care time)  Medications Ordered in UC Medications - No data to display   Initial Impression / Assessment and Plan / UC Course  I have reviewed the triage vital signs and the nursing notes.  Pertinent labs & imaging results that were available during my care of the patient were reviewed by me and considered in my medical decision making (see chart for details).       Final Clinical Impressions(s) / UC Diagnoses   Final diagnoses:  Visit for suture removal  Left toe and toenail have healed well. 2 sutures removed today without problems. Redressed. Follow-up with PCP when necessary.  New Prescriptions New  Prescriptions   No medications on file     Controlled Substance Prescriptions [place] Controlled Substance Registry consulted? Not Applicable   Jacqulyn Cane, MD 08/24/17 1308

## 2017-08-24 NOTE — ED Triage Notes (Signed)
Removed 2 sutures from great toe. Pt tolerated well

## 2017-09-02 ENCOUNTER — Other Ambulatory Visit: Payer: Self-pay | Admitting: Physician Assistant

## 2017-09-19 ENCOUNTER — Ambulatory Visit: Payer: Managed Care, Other (non HMO) | Admitting: Nurse Practitioner

## 2017-09-20 ENCOUNTER — Other Ambulatory Visit: Payer: Self-pay

## 2017-09-20 ENCOUNTER — Emergency Department
Admission: EM | Admit: 2017-09-20 | Discharge: 2017-09-20 | Disposition: A | Discharge status: Home / Self Care | Payer: Managed Care, Other (non HMO) | Source: Home / Self Care | Attending: Family Medicine | Admitting: Family Medicine

## 2017-09-20 ENCOUNTER — Emergency Department (INDEPENDENT_AMBULATORY_CARE_PROVIDER_SITE_OTHER): Payer: Managed Care, Other (non HMO)

## 2017-09-20 DIAGNOSIS — J209 Acute bronchitis, unspecified: Secondary | ICD-10-CM

## 2017-09-20 DIAGNOSIS — I10 Essential (primary) hypertension: Secondary | ICD-10-CM

## 2017-09-20 DIAGNOSIS — J040 Acute laryngitis: Secondary | ICD-10-CM

## 2017-09-20 DIAGNOSIS — Z87898 Personal history of other specified conditions: Secondary | ICD-10-CM

## 2017-09-20 DIAGNOSIS — R05 Cough: Secondary | ICD-10-CM | POA: Diagnosis not present

## 2017-09-20 LAB — POCT RAPID STREP A (OFFICE): Rapid Strep A Screen: NEGATIVE

## 2017-09-20 MED ORDER — BENZONATATE 100 MG PO CAPS: 100.0000 mg | ORAL_CAPSULE | Freq: Three times a day (TID) | ORAL | 0 refills | Status: DC

## 2017-09-20 MED ORDER — PREDNISONE 20 MG PO TABS: ORAL_TABLET | ORAL | 0 refills | Status: DC

## 2017-09-20 MED ORDER — ALBUTEROL SULFATE HFA 108 (90 BASE) MCG/ACT IN AERS: 1.0000 | INHALATION_SPRAY | Freq: Four times a day (QID) | RESPIRATORY_TRACT | 0 refills | Status: DC | PRN

## 2017-09-20 MED ORDER — AMOXICILLIN 500 MG PO CAPS: 500.0000 mg | ORAL_CAPSULE | Freq: Three times a day (TID) | ORAL | 0 refills | Status: DC

## 2017-09-20 NOTE — Discharge Instructions (Signed)
°  You may take 500mg acetaminophen every 4-6 hours or in combination with ibuprofen 400-600mg every 6-8 hours as needed for pain, inflammation, and fever. ° °Be sure to drink at least eight 8oz glasses of water to stay well hydrated and get at least 8 hours of sleep at night, preferably more while sick.  ° °

## 2017-09-20 NOTE — ED Triage Notes (Signed)
Fever, sore throat, short of breath, difficulty sleeping, coughing x 3 days.

## 2017-09-20 NOTE — ED Provider Notes (Signed)
[name]    CSN: [id number] Arrival date & time: 09/20/17  1500     History   Chief Complaint Chief Complaint  Patient presents with  . Shortness of Breath  . Cough  . Sore Throat  . Fever    HPI KARALYNE NUSSER is a 51 y.o. female.   HPI  KAFI DOTTER is a 51 y.o. female presenting to UC with c/o sore throat, subjective fever, SOB, and difficulty sleeping at night due to cough for the last 3 nights. Associated hoarse voice.  She reports hx of recurrent pneumonia and "lung problems"  She was seen at a [facility] in July, dx with clinical pneumonia based on exam but no CXR. She was prescribed Levaquin, prednisone, and albuterol inhaler.  Pt states she was unsure if she could continue taking her inhaler but states symptoms did resolve at that time.  She has tried to f/u with her PCP but cannot seen her for another 4-6 weeks. She also reports having a referral out for pulmonology but cannot be seen for several months.  Pt plans on establishing care with a new PCP as her daughter recently stated to be seen at [facility].  Pt denies known sick contacts but states she was on an airplane recently and thinks that is how she became sick.   HR elevated in triage- hx of tachycardia.  BP elevated in triage- 173/118. Pt denies HA or dizziness. Hx of HTN. Pt states she has not been taking all her medications as prescribed recently due to her sore throat.   Past Medical History:  Diagnosis Date  . Anal fissure   . Cervical disc disorder   . Clostridium difficile infection   . Dysrhythmia    tachycardia  . Esophageal stricture   . GERD (gastroesophageal reflux disease)   . Headache   . Hemorrhoids   . Hypertension   . IBS (irritable bowel syndrome)   . Interstitial cystitis   . Noncompliance    pt denies  . OA (osteoarthritis) of knee   . Palpitations   . Pneumonia   . 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 Situational stress   . Urinary  tract infection     Patient Active Problem List   Diagnosis Date Noted  . Tremor 03/27/2017  . Numbness and tingling 02/23/2017  . 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 Migraine 08/13/2014  . Neck pain 08/13/2014  . Interstitial cystitis 01/24/2013  . Personal history of colonic polyps 01/24/2013  . HTN (hypertension) 09/05/2011  . CARCINOMA, SKIN, SQUAMOUS CELL 02/17/2009  . BLOOD IN STOOL 02/17/2009  . ABDOMINAL PAIN-LLQ 02/17/2009  . GERD 12/10/2008  . OTHER DYSPHAGIA 12/10/2008    Past Surgical History:  Procedure Laterality Date  . BILATERAL OOPHORECTOMY  01/2017   Laparoscopic at [facility] minimally invasive surgery department  . BREAST EXCISIONAL BIOPSY Right 1995  . CESAREAN SECTION  1998,2000   x2  . COLONOSCOPY    . Foot pin post fracture  09/2008   Left foot  . KNEE ARTHROSCOPY  2011    right  . Laparoscopic surgery  01/2007   uterus and ovary x 2  . LAPAROSCOPIC VAGINAL HYSTERECTOMY     02/2008  . MOLE REMOVAL Right 2010  . US ECHOCARDIOGRAPHY  07/31/2008   EF 55-60% with mild LVH    OB History    Gravida Para Term Preterm AB Living   3 2     1  2  SAB TAB Ectopic Multiple Live Births     1             Home Medications    Prior to Admission medications   Medication Sig Start Date End Date Taking? Authorizing Provider  albuterol (PROVENTIL HFA;VENTOLIN HFA) 108 (90 Base) MCG/ACT inhaler Inhale 1-2 puffs every 6 (six) hours as needed into the lungs for wheezing or shortness of breath. 09/20/17   Noe Gens, PA-C  amoxicillin (AMOXIL) 500 MG capsule Take 1 capsule (500 mg total) 3 (three) times daily by mouth. 09/20/17   Noe Gens, PA-C  benzonatate (TESSALON) 100 MG capsule Take 1-2 capsules (100-200 mg total) every 8 (eight) hours by mouth. 09/20/17   Noe Gens, PA-C  cephALEXin (KEFLEX) 500 MG capsule Take 1 capsule (500 mg total) by mouth 3 (three) times daily. 08/15/17   Jacqulyn Cane, MD  Cholecalciferol (VITAMIN D3) 2000 units TABS Take 2,000  Units by mouth daily.    [provider]  cyclobenzaprine (FLEXERIL) 10 MG tablet Take 1 tablet (10 mg total) by mouth 3 (three) times daily as needed. 07/19/16   Dennie Bible, NP  diazepam (VALIUM) 5 MG tablet 1/2-1 TABLET AS NEEDED EVERY 6 HOURS AS NEEDED FOR MUSCLE SPASM ORALLY 30 DAYS 06/23/16   [provider]  DULoxetine (CYMBALTA) 60 MG capsule Take 1 capsule (60 mg total) by mouth daily. 03/27/17 03/27/18  Marcial Pacas, MD  HYDROcodone-acetaminophen (NORCO/VICODIN) 5-325 MG tablet Take 1 tablet by mouth every 6 (six) hours as needed for moderate pain. 08/10/17   Kandra Nicolas, MD  HYDROmorphone (DILAUDID) 2 MG tablet Take 1 tablet (2 mg total) by mouth every 6 (six) hours as needed for severe pain. 08/15/17   Jacqulyn Cane, MD  hyoscyamine (LEVSIN/SL) 0.125 MG SL tablet Place 1 tablet (0.125 mg total) under the tongue every 6 (six) hours as needed. 06/20/16   Levin Erp, PA  ibuprofen (ADVIL,MOTRIN) 800 MG tablet Take 1 tablet (800 mg total) by mouth every 8 (eight) hours as needed. 02/05/15   Fontaine, Belinda Block, MD  losartan (COZAAR) 50 MG tablet TAKE 1 TABLET BY MOUTH TWICE A DAY 07/23/17   Burtis Junes, NP  metoprolol tartrate (LOPRESSOR) 25 MG tablet Take 1 tablet (25 mg total) by mouth 2 (two) times daily. 07/17/17 10/15/17  Burtis Junes, NP  nortriptyline (PAMELOR) 25 MG capsule Take 2 capsules (50 mg total) by mouth at bedtime. 06/13/17   Marcial Pacas, MD  omeprazole (PRILOSEC) 40 MG capsule TAKE 1 CAPSULE (40 MG TOTAL) BY MOUTH 2 (TWO) TIMES DAILY. 07/02/17   Levin Erp, PA  ondansetron (ZOFRAN-ODT) 4 MG disintegrating tablet Take 2 tablets (8 mg total) by mouth every 6 (six) hours as needed. for nausea 03/27/17   Marcial Pacas, MD  predniSONE (DELTASONE) 20 MG tablet 3 tabs po day one, then 2 po daily x 4 days 09/20/17   Noe Gens, PA-C  promethazine (PHENERGAN) 25 MG tablet Take 1 tablet (25 mg total) by mouth every 6 (six) hours as needed for  nausea or vomiting. 03/27/17   Marcial Pacas, MD  rizatriptan (MAXALT-MLT) 10 MG disintegrating tablet TAKE 1 TABLET (10 MG TOTAL) BY MOUTH AS NEEDED. MAY REPEAT IN 2 HOURS IF NEEDED 08/09/17   Dennie Bible, NP  SUMAtriptan (IMITREX) 6 MG/0.5ML SOLN injection Inject 0.5 mLs (6 mg total) into the skin as needed for migraine. May repeat in 2 hours if headache persists or recurs. 03/27/17  Marcial Pacas, MD  traMADol Veatrice Bourbon) 50 MG tablet TAKE ONE TABLET BY MOUTH AS NEEDED FOR HEADACHE 12/19/16   Dennie Bible, NP    Family History Family History  Adopted: Yes  Problem Relation Age of Onset  . Colon cancer Neg Hx   . Pancreatic cancer Neg Hx   . Stomach cancer Neg Hx   . Rectal cancer Neg Hx     Social History Social History   Tobacco Use  . Smoking status: Never Smoker  . Smokeless tobacco: Never Used  Substance Use Topics  . Alcohol use: Yes    Comment: Rare  . Drug use: No     Allergies   Patient has no known allergies.   Review of Systems Review of Systems  Constitutional: Positive for fever ( subjective). Negative for chills.  HENT: Positive for congestion, rhinorrhea, sore throat and voice change. Negative for ear pain and trouble swallowing.   Respiratory: Positive for cough, chest tightness and shortness of breath.   Cardiovascular: Negative for chest pain and palpitations.  Gastrointestinal: Negative for abdominal pain, diarrhea, nausea and vomiting.  Musculoskeletal: Negative for arthralgias, back pain and myalgias.  Skin: Negative for rash.     Physical Exam Triage Vital Signs ED Triage Vitals [09/20/17 1528]  Enc Vitals Group     BP (!) 173/118     Pulse Rate (!) 126     Resp      Temp 98.2 F (36.8 C)     Temp Source Oral     SpO2 97 %     Weight 213 lb (96.6 kg)     Height 5\' 4"  (1.626 m)     Head Circumference      Peak Flow      Pain Score      Pain Loc      Pain Edu?      Excl. in [place]?    No data found.  Updated Vital Signs BP  (!) 170/116   Pulse (!) 126   Temp 98.2 F (36.8 C) (Oral)   Ht 5\' 4"  (1.626 m)   Wt 213 lb (96.6 kg)   SpO2 97%   BMI 36.56 kg/m   Visual Acuity Right Eye Distance:   Left Eye Distance:   Bilateral Distance:    Right Eye Near:   Left Eye Near:    Bilateral Near:     Physical Exam  Constitutional: She appears well-developed and well-nourished.  Non-toxic appearance. She does not appear ill. No distress.  HENT:  Head: Normocephalic and atraumatic.  Right Ear: Tympanic membrane normal.  Left Ear: Tympanic membrane normal.  Nose: Nose normal.  Mouth/Throat: Uvula is midline, oropharynx is clear and moist and mucous membranes are normal.  Eyes: Conjunctivae are normal. No scleral icterus.  Neck: Normal range of motion.  Cardiovascular: Regular rhythm and normal heart sounds. Tachycardia present.  Pulmonary/Chest: Effort normal and breath sounds normal. No stridor. No tachypnea. No respiratory distress. She has no decreased breath sounds. She has no wheezes. She has no rales. She exhibits no tenderness.  Hoarse voice but no stridor. Lungs: CTAB. No evidence of respiratory distress.  Abdominal: Soft. Bowel sounds are normal. She exhibits no distension and no mass. There is no tenderness. There is no rebound and no guarding.  Musculoskeletal: Normal range of motion.  Neurological: She is alert.  Skin: Skin is warm and dry. She is not diaphoretic.  Nursing note and vitals reviewed.    UC Treatments / Results  Labs (all labs ordered are listed, but only abnormal results are displayed) Labs Reviewed  POCT RAPID STREP A (OFFICE)    EKG  EKG Interpretation None       Radiology Dg Chest 2 View  Result Date: 09/20/2017 CLINICAL DATA:  Nonproductive cough for the past 4 days. EXAM: CHEST  2 VIEW COMPARISON:  08/06/2008. FINDINGS: Normal sized heart. Clear lungs. Mild peribronchial thickening. Minimal thoracic spine degenerative changes. Cholecystectomy clips. IMPRESSION:  Mild bronchitic changes. Electronically Signed   By: Claudie Revering M.D.   On: 09/20/2017 16:09    Procedures Procedures (including critical care time)  Medications Ordered in UC Medications - No data to display   Initial Impression / Assessment and Plan / UC Course  I have reviewed the triage vital signs and the nursing notes.  Pertinent labs & imaging results that were available during my care of the patient were reviewed by me and considered in my medical decision making (see chart for details).     Pt c/o 3 days of worsening URI symptoms including SOB, sore throat and cough that keeps her up at night. Hx of recurrent pneumonia and "lung issues"  CXR: mild bronchitic changes Pt is afebrile, tachycardic (hx of tachycardia, pt was prescribed Metoprolol 2 months ago, unclear if pt is taking routinely, has missed a few days due to sore throat)  Symptoms likely viral in nature causing laryngitis, however, due to hx of lung problems including pneumonia, will start pt on Amoxicillin along with tessalon, prednisone and albuterol inhaler Resource guide provided for pt to establish care with a new PCP  Strongly encouraged to take her medication as prescribed to help prevent risk of renal failure, stroke, and/or worsening cardiac issues.  Discussed symptoms that warrant emergent care in the ED.     Final Clinical Impressions(s) / UC Diagnoses   Final diagnoses:  Acute bronchitis, unspecified organism  Laryngitis, acute  Uncontrolled hypertension  History of tachycardia    ED Discharge Orders        Ordered    benzonatate (TESSALON) 100 MG capsule  Every 8 hours     09/20/17 1630    predniSONE (DELTASONE) 20 MG tablet     09/20/17 1630    albuterol (PROVENTIL HFA;VENTOLIN HFA) 108 (90 Base) MCG/ACT inhaler  Every 6 hours PRN     09/20/17 1630    amoxicillin (AMOXIL) 500 MG capsule  3 times daily     09/20/17 1630       Controlled Substance Prescriptions [place] Controlled  Substance Registry consulted? Not Applicable   Tyrell Antonio 09/20/17 1497

## 2017-09-22 ENCOUNTER — Telehealth: Payer: Self-pay | Admitting: Emergency Medicine

## 2017-09-25 ENCOUNTER — Telehealth: Payer: Self-pay

## 2017-09-25 NOTE — Telephone Encounter (Signed)
Reviewed pt's chart from most recent visit.  Pt had clear CXR and was c/o multiple symptoms and hx of self reported hx of "multiple lung problems."  If patient has only had two doses (2 pills) of amoxicillin, I do not believe her reported symptoms are related to small dose if she has had amoxicillin in the past.  Due to normal CXR but self reported extensive PMH, strongly encourage her to follow up with her primary care provider for new symptoms- possible thrush and GI symptoms.  Hesitant to prescribe another antibiotic for this patient without reevaluating her.  Encourage fluids, rest, bland diet for GI symptoms.

## 2017-09-25 NOTE — Telephone Encounter (Signed)
Spoke with patient, and she will call primary and follow up with them since she has so many health issues.

## 2017-09-25 NOTE — Telephone Encounter (Signed)
Seen last Thursday.  Antibiotic is tearing stomach up, having diarrhea.  Taking two omeprazole's each time.  Has had amoxicillin in pill form and has not had this problem. Thinks may be getting thrush as her tongue is white.  Please advise.

## 2017-09-25 NOTE — Telephone Encounter (Signed)
After further review of chart, thrush could be from prescribed prednisone, still strongly encourage re-evaluation of patient prior to prescribing any new medications for pt.

## 2017-09-27 ENCOUNTER — Telehealth: Payer: Self-pay

## 2017-09-27 NOTE — Telephone Encounter (Signed)
PT called office asking for an appt because the antibiotic she is taking isn't working with her system she was told by urgent care to call and schedule with our office. PT is not currently a patient here and has a PCP Dr. Tamala Julian, that she is not willing to stop seeing and establish a new PCP here. I explained that we would need to make a new PT appt to establish care in our office. PT is going to call urgent care to get more guidance and call her current PCP to see if they have openings.

## 2017-09-28 ENCOUNTER — Emergency Department
Admission: EM | Admit: 2017-09-28 | Discharge: 2017-09-28 | Disposition: A | Discharge status: Home / Self Care | Payer: Managed Care, Other (non HMO) | Source: Home / Self Care | Attending: Family Medicine | Admitting: Family Medicine

## 2017-09-28 ENCOUNTER — Encounter: Payer: Self-pay | Admitting: Emergency Medicine

## 2017-09-28 ENCOUNTER — Other Ambulatory Visit: Payer: Self-pay

## 2017-09-28 ENCOUNTER — Emergency Department (INDEPENDENT_AMBULATORY_CARE_PROVIDER_SITE_OTHER): Payer: Managed Care, Other (non HMO)

## 2017-09-28 DIAGNOSIS — R05 Cough: Secondary | ICD-10-CM | POA: Diagnosis not present

## 2017-09-28 DIAGNOSIS — J4521 Mild intermittent asthma with (acute) exacerbation: Secondary | ICD-10-CM | POA: Diagnosis not present

## 2017-09-28 DIAGNOSIS — R531 Weakness: Secondary | ICD-10-CM

## 2017-09-28 DIAGNOSIS — R079 Chest pain, unspecified: Secondary | ICD-10-CM

## 2017-09-28 DIAGNOSIS — R5383 Other fatigue: Secondary | ICD-10-CM

## 2017-09-28 LAB — POCT CBC W AUTO DIFF (K'VILLE URGENT CARE)

## 2017-09-28 MED ORDER — BENZONATATE 100 MG PO CAPS: 100.0000 mg | ORAL_CAPSULE | Freq: Three times a day (TID) | ORAL | 0 refills | Status: DC

## 2017-09-28 MED ORDER — AZITHROMYCIN 250 MG PO TABS: 250.0000 mg | ORAL_TABLET | Freq: Every day | ORAL | 0 refills | Status: DC

## 2017-09-28 NOTE — ED Triage Notes (Signed)
Patient here for medication consultation; was placed on antibiotics and prednisone on 09/20/17 and had so much GI distress she stopped both 2 days ago.

## 2017-09-28 NOTE — ED Provider Notes (Signed)
[name]    CSN: [id number] Arrival date & time: 09/28/17  1638     History   Chief Complaint Chief Complaint  Patient presents with  . Follow-up    HPI Deanna George is a 51 y.o. female.   HPI Deanna George is a 51 y.o. female presenting to UC with c/o still feeling bad including mild intermittent cough and congestion with weakness and fatigue despite taking several doses of prednisone and amoxicillin prescribed on 09/20/17 for URI symptoms. Pt states she started to have stomach upset and diarrhea within the first few hours of taking the antibiotic but states she kept taking it periodically because she felt she needed to take for her URI symptoms.  She was also taking prednisone, tessalon and uses her albuterol inhaler.  She notes the tessalon helped significantly.  She is wanting a medication consultation today because she feels she still needs and antibiotic but was advised to stop taking the amoxicillin due to the GI upset.   She did call Primary Care at [facility] yesterday to establish care but was advised she would need to "cut ties" with her current PCP. Pt hesitant to do this as she is unsure if she will like the new PCP she would be assigned to.  She would like to have a consult visit with a new PCP first before "cutting ties."  For now, she does not have a f/u with her PCP until mid-December.     Past Medical History:  Diagnosis Date  . Anal fissure   . Cervical disc disorder   . Clostridium difficile infection   . Dysrhythmia    tachycardia  . Esophageal stricture   . GERD (gastroesophageal reflux disease)   . Headache   . Hemorrhoids   . Hypertension   . IBS (irritable bowel syndrome)   . Interstitial cystitis   . Noncompliance    pt denies  . OA (osteoarthritis) of knee   . Palpitations   . Pneumonia   . 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 Situational stress   . Urinary tract infection     Patient Active Problem  List   Diagnosis Date Noted  . Tremor 03/27/2017  . Numbness and tingling 02/23/2017  . 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 Migraine 08/13/2014  . Neck pain 08/13/2014  . Interstitial cystitis 01/24/2013  . Personal history of colonic polyps 01/24/2013  . HTN (hypertension) 09/05/2011  . CARCINOMA, SKIN, SQUAMOUS CELL 02/17/2009  . BLOOD IN STOOL 02/17/2009  . ABDOMINAL PAIN-LLQ 02/17/2009  . GERD 12/10/2008  . OTHER DYSPHAGIA 12/10/2008    Past Surgical History:  Procedure Laterality Date  . BILATERAL OOPHORECTOMY  01/2017   Laparoscopic at [facility] minimally invasive surgery department  . BILATERAL SALPINGECTOMY, Bilateral 02/02/2015   Performed by Anastasio Auerbach, MD at [facility] ORS  . BREAST EXCISIONAL BIOPSY Right 1995  . CESAREAN SECTION  1998,2000   x2  . COLONOSCOPY    . Foot pin post fracture  09/2008   Left foot  . KNEE ARTHROSCOPY  2011    right  . LAPAROSCOPIC CHOLECYSTECTOMY WITH INTRAOPERATIVE CHOLANGIOGRAM N/A 01/28/2013   Performed by Earnstine Regal, MD at [facility] ORS  . Laparoscopic surgery  01/2007   uterus and ovary x 2  . LAPAROSCOPIC VAGINAL HYSTERECTOMY     02/2008  . LAPAROSCOPY DIAGNOSTIC, FULGERATION ENDOMETREOSIS, EXCISION RIGHT OVARIAN CYST, LYSIS OF ADHESIONS, EXCISION VULVAR AND VAGINAL CYST  N/A 02/02/2015   Performed  by Anastasio Auerbach, MD at [facility] ORS  . MOLE REMOVAL Right 2010  . US ECHOCARDIOGRAPHY  07/31/2008   EF 55-60% with mild LVH    OB History    Gravida Para Term Preterm AB Living   3 2     1 2    SAB TAB Ectopic Multiple Live Births     1             Home Medications    Prior to Admission medications   Medication Sig Start Date End Date Taking? Authorizing Provider  albuterol (PROVENTIL HFA;VENTOLIN HFA) 108 (90 Base) MCG/ACT inhaler Inhale 1-2 puffs every 6 (six) hours as needed into the lungs for wheezing or shortness of breath. 09/20/17   Noe Gens, PA-C  amoxicillin (AMOXIL) 500 MG capsule Take 1 capsule (500 mg total) 3 (three)  times daily by mouth. 09/20/17   Noe Gens, PA-C  azithromycin (ZITHROMAX) 250 MG tablet Take 1 tablet (250 mg total) daily by mouth. Take first 2 tablets together, then 1 every day until finished. 09/28/17   Noe Gens, PA-C  benzonatate (TESSALON) 100 MG capsule Take 1-2 capsules (100-200 mg total) every 8 (eight) hours by mouth. 09/20/17   Noe Gens, PA-C  benzonatate (TESSALON) 100 MG capsule Take 1-2 capsules (100-200 mg total) every 8 (eight) hours by mouth. 09/28/17   Camille Dragan, Bronwen Betters, PA-C  cephALEXin (KEFLEX) 500 MG capsule Take 1 capsule (500 mg total) by mouth 3 (three) times daily. 08/15/17   Jacqulyn Cane, MD  Cholecalciferol (VITAMIN D3) 2000 units TABS Take 2,000 Units by mouth daily.    [provider]  cyclobenzaprine (FLEXERIL) 10 MG tablet Take 1 tablet (10 mg total) by mouth 3 (three) times daily as needed. 07/19/16   Dennie Bible, NP  diazepam (VALIUM) 5 MG tablet 1/2-1 TABLET AS NEEDED EVERY 6 HOURS AS NEEDED FOR MUSCLE SPASM ORALLY 30 DAYS 06/23/16   [provider]  DULoxetine (CYMBALTA) 60 MG capsule Take 1 capsule (60 mg total) by mouth daily. 03/27/17 03/27/18  Marcial Pacas, MD  HYDROcodone-acetaminophen (NORCO/VICODIN) 5-325 MG tablet Take 1 tablet by mouth every 6 (six) hours as needed for moderate pain. 08/10/17   Kandra Nicolas, MD  HYDROmorphone (DILAUDID) 2 MG tablet Take 1 tablet (2 mg total) by mouth every 6 (six) hours as needed for severe pain. 08/15/17   Jacqulyn Cane, MD  hyoscyamine (LEVSIN/SL) 0.125 MG SL tablet Place 1 tablet (0.125 mg total) under the tongue every 6 (six) hours as needed. 06/20/16   Levin Erp, PA  ibuprofen (ADVIL,MOTRIN) 800 MG tablet Take 1 tablet (800 mg total) by mouth every 8 (eight) hours as needed. 02/05/15   Fontaine, Belinda Block, MD  losartan (COZAAR) 50 MG tablet TAKE 1 TABLET BY MOUTH TWICE A DAY 07/23/17   Burtis Junes, NP  metoprolol tartrate (LOPRESSOR) 25 MG tablet Take 1 tablet (25 mg  total) by mouth 2 (two) times daily. 07/17/17 10/15/17  Burtis Junes, NP  nortriptyline (PAMELOR) 25 MG capsule Take 2 capsules (50 mg total) by mouth at bedtime. 06/13/17   Marcial Pacas, MD  omeprazole (PRILOSEC) 40 MG capsule TAKE 1 CAPSULE (40 MG TOTAL) BY MOUTH 2 (TWO) TIMES DAILY. 07/02/17   Levin Erp, PA  ondansetron (ZOFRAN-ODT) 4 MG disintegrating tablet Take 2 tablets (8 mg total) by mouth every 6 (six) hours as needed. for nausea 03/27/17   Marcial Pacas, MD  predniSONE (DELTASONE) 20 MG tablet  3 tabs po day one, then 2 po daily x 4 days 09/20/17   Noe Gens, PA-C  promethazine (PHENERGAN) 25 MG tablet Take 1 tablet (25 mg total) by mouth every 6 (six) hours as needed for nausea or vomiting. 03/27/17   Marcial Pacas, MD  rizatriptan (MAXALT-MLT) 10 MG disintegrating tablet TAKE 1 TABLET (10 MG TOTAL) BY MOUTH AS NEEDED. MAY REPEAT IN 2 HOURS IF NEEDED 08/09/17   Dennie Bible, NP  SUMAtriptan (IMITREX) 6 MG/0.5ML SOLN injection Inject 0.5 mLs (6 mg total) into the skin as needed for migraine. May repeat in 2 hours if headache persists or recurs. 03/27/17   Marcial Pacas, MD  traMADol Veatrice Bourbon) 50 MG tablet TAKE ONE TABLET BY MOUTH AS NEEDED FOR HEADACHE 12/19/16   Dennie Bible, NP    Family History Family History  Adopted: Yes  Problem Relation Age of Onset  . Colon cancer Neg Hx   . Pancreatic cancer Neg Hx   . Stomach cancer Neg Hx   . Rectal cancer Neg Hx     Social History Social History   Tobacco Use  . Smoking status: Never Smoker  . Smokeless tobacco: Never Used  Substance Use Topics  . Alcohol use: Yes    Comment: Rare  . Drug use: No     Allergies   Patient has no known allergies.   Review of Systems Review of Systems  Constitutional: Positive for fatigue. Negative for chills and fever.  HENT: Positive for congestion and postnasal drip. Negative for ear pain, sore throat, trouble swallowing and voice change.   Respiratory: Positive for cough  and chest tightness. Negative for shortness of breath.   Cardiovascular: Negative for chest pain and palpitations.  Gastrointestinal: Positive for diarrhea. Negative for abdominal pain, nausea and vomiting.  Genitourinary: Negative for dysuria, frequency and hematuria.  Musculoskeletal: Positive for arthralgias and myalgias. Negative for back pain.       Body aches  Skin: Negative for rash.  Neurological: Positive for weakness. Negative for dizziness, light-headedness and headaches.     Physical Exam Triage Vital Signs ED Triage Vitals  Enc Vitals Group     BP 09/28/17 1737 124/85     Pulse Rate 09/28/17 1737 98     Resp 09/28/17 1737 16     Temp 09/28/17 1737 98.6 F (37 C)     Temp Source 09/28/17 1737 Oral     SpO2 09/28/17 1737 96 %     Weight 09/28/17 1738 212 lb (96.2 kg)     Height 09/28/17 1738 5\' 4"  (1.626 m)     Head Circumference --      Peak Flow --      Pain Score --      Pain Loc --      Pain Edu? --      Excl. in [place]? --    No data found.  Updated Vital Signs BP 124/85 (BP Location: Right Arm)   Pulse 98   Temp 98.6 F (37 C) (Oral)   Resp 16   Ht 5\' 4"  (1.626 m)   Wt 212 lb (96.2 kg)   SpO2 96%   BMI 36.39 kg/m   Visual Acuity Right Eye Distance:   Left Eye Distance:   Bilateral Distance:    Right Eye Near:   Left Eye Near:    Bilateral Near:     Physical Exam  Constitutional: She is oriented to person, place, and time. She appears well-developed and well-nourished.  No distress.  HENT:  Head: Normocephalic and atraumatic.  Right Ear: Tympanic membrane normal.  Left Ear: Tympanic membrane normal.  Nose: Nose normal.  Mouth/Throat: Uvula is midline, oropharynx is clear and moist and mucous membranes are normal.  Eyes: EOM are normal.  Neck: Normal range of motion. Neck supple.  Cardiovascular: Normal rate and regular rhythm.  Pulmonary/Chest: Effort normal and breath sounds normal. No stridor. No respiratory distress. She has no wheezes.  She has no rales.  Abdominal: Soft. She exhibits no distension. There is no tenderness.  Musculoskeletal: Normal range of motion.  Neurological: She is alert and oriented to person, place, and time.  Skin: Skin is warm and dry. No rash noted. She is not diaphoretic.  Psychiatric: She has a normal mood and affect. Her behavior is normal.  Nursing note and vitals reviewed.    UC Treatments / Results  Labs (all labs ordered are listed, but only abnormal results are displayed) Labs Reviewed  COMPLETE METABOLIC PANEL WITH GFR  POCT CBC W AUTO DIFF ([place])    EKG  EKG Interpretation None       Radiology Dg Chest 2 View  Result Date: 09/28/2017 CLINICAL DATA:  Cough.  Central chest pain. EXAM: CHEST  2 VIEW COMPARISON:  09/20/2017. FINDINGS: Normal sized heart. Clear lungs. Minimal peribronchial thickening. Minimal thoracic spine degenerative changes. IMPRESSION: Minimal bronchitic changes. Electronically Signed   By: Claudie Revering M.D.   On: 09/28/2017 18:22    Procedures Procedures (including critical care time)  Medications Ordered in UC Medications - No data to display   Initial Impression / Assessment and Plan / UC Course  I have reviewed the triage vital signs and the nursing notes.  Pertinent labs & imaging results that were available during my care of the patient were reviewed by me and considered in my medical decision making (see chart for details).     Pt c/o still "not feeling well" today after being partially treated with amoxicillin this past week. Medical records reviewed.  Pt's voice has returned back to normal and pt denies sore throat at this time. Diarrhea has gradually resolved after stopping the amoxicillin and penicillin. Vitals have improved significantly since visit on 09/20/17, vitals are now WNL compared to HR of 126 and BP of 170/116 last week  Reassured pt everything appears to be improving since last visit, hesitant to start pt on  new antibiotics "just because." Pt still "doesn't feel well" Agreed to have labs drawn and CXR repeated.  CXR shows mild improvement with minimal bronchitic change  CBC: WBC- 14.1 CMP: Pending  Will start pt on azithromycin and advised stopping nortriptyline for 1 week to help prevent QT prolongation with the Z-pak.  Pt agreeable. Refill for tessalon also provided.  Discussed process of transitioning care with new PCP at [facility]. Encouraged to call Monday 10/01/17 to schedule a new patient/consult visit. Pt would like to see Dr. Sheppard Coil, as this is who her daughter sees.  Advised pt it may be 1-2 weeks before she is able to make this appointment due to Thanksgiving next week.  Pt understanding and willing to schedule consult appointment for a new PCP.    Final Clinical Impressions(s) / UC Diagnoses   Final diagnoses:  Asthmatic bronchitis, mild intermittent, with acute exacerbation  Fatigue, unspecified type  Generalized weakness    ED Discharge Orders        Ordered    azithromycin (ZITHROMAX) 250 MG tablet  Daily  09/28/17 1858    benzonatate (TESSALON) 100 MG capsule  Every 8 hours     09/28/17 1858       Controlled Substance Prescriptions [place] Controlled Substance Registry consulted? Not Applicable   Tyrell Antonio 09/28/17 [id number]

## 2017-09-28 NOTE — Discharge Instructions (Signed)
°  Please stop taking your nortriptyline for at least 7 days while taking azithromycin to help prevent drug interaction.  If you develop chest pain, shortness or breath or palpitations (skipped beat sensation) please stop taking the azithromycin and call our office.  The other antibiotic, doxycycline will not interact with your medications but it has a bigger likelihood of causing more stomach upset including C. Diff.   Please try again on Monday to establish care with Dr. Sheppard Coil. Please let registration/receptionist know you would like to meet for a new patient visit/consultation and NOT for a sick visit as they require you to establish care prior to being seen for sick visits.

## 2017-09-29 LAB — COMPLETE METABOLIC PANEL WITH GFR
AG Ratio: 1.5 (calc) (ref 1.0–2.5)
ALT: 12 U/L (ref 6–29)
AST: 13 U/L (ref 10–35)
Albumin: 4.3 g/dL (ref 3.6–5.1)
Alkaline phosphatase (APISO): 94 U/L (ref 33–115)
BUN: 14 mg/dL (ref 7–25)
CO2: 28 mmol/L (ref 20–32)
Calcium: 9.6 mg/dL (ref 8.6–10.2)
Chloride: 100 mmol/L (ref 98–110)
Creat: 0.95 mg/dL (ref 0.50–1.10)
GFR, Est African American: 82 mL/min/{1.73_m2} (ref >=60)
GFR, Est Non African American: 70 mL/min/{1.73_m2} (ref >=60)
Globulin: 2.8 g/dL (calc) (ref 1.9–3.7)
Glucose, Bld: 101 mg/dL — ABNORMAL HIGH (ref 65–99)
Potassium: 4.6 mmol/L (ref 3.5–5.3)
Sodium: 137 mmol/L (ref 135–146)
Total Bilirubin: 0.4 mg/dL (ref 0.2–1.2)
Total Protein: 7.1 g/dL (ref 6.1–8.1)

## 2017-10-01 ENCOUNTER — Telehealth: Payer: Self-pay | Admitting: Neurology

## 2017-10-01 ENCOUNTER — Telehealth: Payer: Self-pay | Admitting: Emergency Medicine

## 2017-10-01 ENCOUNTER — Other Ambulatory Visit: Payer: Self-pay | Admitting: *Deleted

## 2017-10-01 DIAGNOSIS — G43709 Chronic migraine without aura, not intractable, without status migrainosus: Secondary | ICD-10-CM

## 2017-10-01 DIAGNOSIS — R569 Unspecified convulsions: Secondary | ICD-10-CM

## 2017-10-01 NOTE — Telephone Encounter (Signed)
Attempted to call pt this morning, not able to l/m pt's voicemail is full.

## 2017-10-01 NOTE — Telephone Encounter (Signed)
Attempted to call patient three times - no answer and mailbox is full.  Dr. Krista Blue is agreeable to her request for referral to neurology at [facility] and the order has been put in [place].  If patient calls back, please let her know to expect a call from [facility] neurology to schedule her appointment.

## 2017-10-01 NOTE — Telephone Encounter (Signed)
Pt called said tremors are worse, jerking movements are increased, voiding on herself, sensitivity level is very "off", hard time concentrating, dropping things more, exhausted, biting her tongue while sleeping, overwhelmed when at stores. BP is high, immune system is low, she has been to ED twice recently. She she cardiologist is wanting her to see neurologist at [facility]. Please call to discuss

## 2017-10-01 NOTE — Telephone Encounter (Signed)
Labs normal unable to leave msg, mailbox full

## 2017-10-08 ENCOUNTER — Other Ambulatory Visit: Payer: Self-pay | Admitting: Physician Assistant

## 2017-10-15 ENCOUNTER — Telehealth: Payer: Self-pay | Admitting: Nurse Practitioner

## 2017-10-15 NOTE — Telephone Encounter (Signed)
New message     Patient is going to see neurologist at [facility] does she still need to keep the appt for 10/16/17 ?

## 2017-10-15 NOTE — Telephone Encounter (Signed)
Patient calling, would like to provide the following updates:  Patient was seen by Dr. Linus Mako and was referred to a [place] [facility] ([place] for neurology. Patient states that she was also referred to a pulmonary specialist.  Patient would like to thank Cecille Rubin so much for her attention and persistence with her.

## 2017-10-15 NOTE — Telephone Encounter (Signed)
Noted  

## 2017-10-15 NOTE — Telephone Encounter (Signed)
If her neurologic issues are more pressing - ok to reschedule. Otherwise, I am happy to see her tomorrow.

## 2017-10-15 NOTE — Telephone Encounter (Signed)
Spoke with pt who is aware of Lori's comments.  Pt is concerned about the expense with co-pays etc.  She will decide whether she wants to keep the appt for tomorrow or c/b today to reschedule if necessary.

## 2017-10-15 NOTE — Telephone Encounter (Signed)
Will forward to [facility] for review however pt is scheduled to f/u with her for management of HTN.

## 2017-10-16 ENCOUNTER — Ambulatory Visit: Payer: Managed Care, Other (non HMO) | Admitting: Nurse Practitioner

## 2017-10-22 ENCOUNTER — Other Ambulatory Visit: Payer: Self-pay | Admitting: Physician Assistant

## 2017-11-20 ENCOUNTER — Ambulatory Visit: Payer: Managed Care, Other (non HMO) | Admitting: Nurse Practitioner

## 2017-11-20 ENCOUNTER — Encounter: Payer: Self-pay | Admitting: Nurse Practitioner

## 2017-11-20 ENCOUNTER — Encounter (INDEPENDENT_AMBULATORY_CARE_PROVIDER_SITE_OTHER): Payer: Self-pay

## 2017-11-20 VITALS — BP 140/100 | HR 92 | Ht 64.0 in | Wt 218.8 lb

## 2017-11-20 DIAGNOSIS — I10 Essential (primary) hypertension: Secondary | ICD-10-CM | POA: Diagnosis not present

## 2017-11-20 DIAGNOSIS — R002 Palpitations: Secondary | ICD-10-CM

## 2017-11-20 DIAGNOSIS — G259 Extrapyramidal and movement disorder, unspecified: Secondary | ICD-10-CM | POA: Diagnosis not present

## 2017-11-20 MED ORDER — METOPROLOL TARTRATE 50 MG PO TABS: 50.0000 mg | ORAL_TABLET | Freq: Two times a day (BID) | ORAL | 3 refills | Status: DC
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
## 2017-11-20 NOTE — Patient Instructions (Signed)
We will be checking the following labs today - NONE   Medication Instructions:    Continue with your current medicines. BUT   I am increasing the Lopressor to 50 mg to take twice a day - this has been sent to your drug store.    Testing/Procedures To Be Arranged:  N/A  Follow-Up:   See me in 6 months    Other Special Instructions:   N/A    If you need a refill on your cardiac medications before your next appointment, please call your pharmacy.   Call the [place] office at [phone] if you have any questions, problems or concerns.

## 2017-11-20 NOTE — Progress Notes (Deleted)
CARDIOLOGY OFFICE NOTE  Date:  11/20/2017    Deanna George Date of Birth: [birth year] Medical Record #[id number]  PCP:  [name], MD  Cardiologist:  Servando Snare & ***    No chief complaint on file.   History of Present Illness: Deanna George is a 52 y.o. female who presents today for a ***   Comes in today. Here with   Past Medical History:  Diagnosis Date  . Anal fissure   . Cervical disc disorder   . Clostridium difficile infection   . Dysrhythmia    tachycardia  . Esophageal stricture   . GERD (gastroesophageal reflux disease)   . Headache   . Hemorrhoids   . Hypertension   . IBS (irritable bowel syndrome)   . Interstitial cystitis   . Noncompliance    pt denies  . OA (osteoarthritis) of knee   . Palpitations   . Pneumonia   . 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 Situational stress   . Urinary tract infection     Past Surgical History:  Procedure Laterality Date  . BILATERAL OOPHORECTOMY  01/2017   Laparoscopic at [facility] minimally invasive surgery department  . BREAST EXCISIONAL BIOPSY Right 1995  . CESAREAN SECTION  1998,2000   x2  . CHOLECYSTECTOMY N/A 01/28/2013   Procedure: LAPAROSCOPIC CHOLECYSTECTOMY WITH INTRAOPERATIVE CHOLANGIOGRAM;  Surgeon: Earnstine Regal, MD;  Location: [facility];  Service: General;  Laterality: N/A;  . COLONOSCOPY    . Foot pin post fracture  09/2008   Left foot  . KNEE ARTHROSCOPY  2011    right  . LAPAROSCOPIC BILATERAL SALPINGECTOMY Bilateral 02/02/2015   Procedure: BILATERAL SALPINGECTOMY, ;  Surgeon: Anastasio Auerbach, MD;  Location: [facility];  Service: Gynecology;  Laterality: Bilateral;  . Laparoscopic surgery  01/2007   uterus and ovary x 2  . LAPAROSCOPIC VAGINAL HYSTERECTOMY     02/2008  . LAPAROSCOPY N/A 02/02/2015   Procedure: LAPAROSCOPY DIAGNOSTIC, FULGERATION ENDOMETREOSIS, EXCISION RIGHT OVARIAN CYST, LYSIS OF ADHESIONS, EXCISION VULVAR AND VAGINAL CYST ;  Surgeon: Anastasio Auerbach, MD;  Location:  [facility];  Service: Gynecology;  Laterality: N/A;  . MOLE REMOVAL Right 2010  . US ECHOCARDIOGRAPHY  07/31/2008   EF 55-60% with mild LVH     Medications: No outpatient medications have been marked as taking for the 11/20/17 encounter (Appointment) with Burtis Junes, NP.   Current Facility-Administered Medications for the 11/20/17 encounter (Appointment) with Burtis Junes, NP  Medication  . 0.9 %  sodium chloride infusion     Allergies: No Known Allergies  Social History: The patient  reports that  has never smoked. she has never used smokeless tobacco. She reports that she drinks alcohol. She reports that she does not use drugs.   Family History: The patient's ***family history is not on file. She was adopted.   Review of Systems: Please see the history of present illness.   Otherwise, the review of systems is positive for {NONE DEFAULTED:18576::"none"}.   All other systems are reviewed and negative.   Physical Exam: VS:  There were no vitals taken for this visit. Marland Kitchen  BMI There is no height or weight on file to calculate BMI.  Wt Readings from Last 3 Encounters:  09/28/17 212 lb (96.2 kg)  09/20/17 213 lb (96.6 kg)  08/15/17 200 lb (90.7 kg)    General: Pleasant. Well developed, well nourished and in no acute distress.   HEENT: Normal.  Neck: Supple, no JVD, carotid bruits, or masses noted.  Cardiac: ***Regular rate and rhythm. No murmurs, rubs, or gallops. No edema.  Respiratory:  Lungs are clear to auscultation bilaterally with normal work of breathing.  GI: Soft and nontender.  MS: No deformity or atrophy. Gait and ROM intact.  Skin: Warm and dry. Color is normal.  Neuro:  Strength and sensation are intact and no gross focal deficits noted.  Psych: Alert, appropriate and with normal affect.   LABORATORY DATA:  EKG:  EKG {ACTION; IS/IS BPZ:[id number]} ordered today. This demonstrates ***.  Lab Results  Component Value Date   WBC 7.6 03/27/2017   HGB 12.5  03/27/2017   HCT 39.0 03/27/2017   PLT 248 03/27/2017   GLUCOSE 101 (H) 09/28/2017   CHOL 173 08/18/2014   TRIG 81 08/18/2014   HDL 47 08/18/2014   LDLCALC 110 (H) 08/18/2014   ALT 12 09/28/2017   AST 13 09/28/2017   NA 137 09/28/2017   K 4.6 09/28/2017   CL 100 09/28/2017   CREATININE 0.95 09/28/2017   BUN 14 09/28/2017   CO2 28 09/28/2017   TSH 0.830 03/27/2017   INR 1.01 01/24/2013     BNP (last 3 results) No results for input(s): BNP in the last 8760 hours.  ProBNP (last 3 results) No results for input(s): PROBNP in the last 8760 hours.   Other Studies Reviewed Today:   Assessment/Plan:   Current medicines are reviewed with the patient today.  The patient does not have concerns regarding medicines other than what has been noted above.  The following changes have been made:  See above.  Labs/ tests ordered today include:   No orders of the defined types were placed in this encounter.    Disposition:   FU with *** in {gen number 8-24:235361} {Days to years:10300}.   Patient is agreeable to this plan and will call if any problems develop in the interim.   SignedTruitt Merle, NP  11/20/2017 7:33 AM  [name] [address] [place], [place]  [address] Phone: [phone] Fax: [phone]

## 2017-11-21 ENCOUNTER — Ambulatory Visit: Payer: Managed Care, Other (non HMO) | Admitting: Nurse Practitioner

## 2017-12-16 ENCOUNTER — Other Ambulatory Visit: Payer: Self-pay | Admitting: Nurse Practitioner

## 2017-12-20 ENCOUNTER — Ambulatory Visit: Payer: Managed Care, Other (non HMO) | Admitting: Neurology

## 2017-12-20 ENCOUNTER — Telehealth: Payer: Self-pay | Admitting: *Deleted

## 2017-12-20 NOTE — Telephone Encounter (Signed)
No showed skin biopsy appointment.

## 2017-12-21 ENCOUNTER — Encounter: Payer: Self-pay | Admitting: Neurology

## 2018-01-16 ENCOUNTER — Telehealth: Payer: Self-pay | Admitting: Neurology

## 2018-01-16 ENCOUNTER — Telehealth: Payer: Self-pay | Admitting: Nurse Practitioner

## 2018-01-16 ENCOUNTER — Other Ambulatory Visit: Payer: Self-pay | Admitting: Nurse Practitioner

## 2018-01-16 DIAGNOSIS — R002 Palpitations: Secondary | ICD-10-CM

## 2018-01-16 NOTE — Telephone Encounter (Signed)
Patient is calling to discuss getting an MRI. Her tremors are continuously getting worse and affecting her speech and thinking. She has an appointment with Dr. Krista Blue on 04-22-18.

## 2018-01-16 NOTE — Telephone Encounter (Signed)
Ok to place event monitor.  I can see after that. Not sure that I have much to offer her unfortunately.

## 2018-01-16 NOTE — Telephone Encounter (Signed)
S/w pt is complaining of her heart doing the happy dance and when pt turns upside down seems to stop it.  Was agreeable with event monitor and a f/u with Cecille Rubin after monitor.  appt's made and confirmed.

## 2018-01-16 NOTE — Telephone Encounter (Signed)
Deanna George is calling because she think she needs to have a ultra sound ,feeling uneasy because of her monitor. Feels like she needs to wear a heart monitor . Heart is pounding . Thinks she needs to come in to see  Cecille Rubin. Please call

## 2018-01-17 NOTE — Telephone Encounter (Signed)
I called pt to offer her an appt with Hoyle Sauer, NP. No answer, left a message asking her to call me back.  If pt calls back, please offer her an appt with Hoyle Sauer. This appt is necessary before any further orders are placed, per Dr. Rhea Belton recommendations.

## 2018-01-17 NOTE — Telephone Encounter (Signed)
Reviewed her chart saw patient in 2018, had extensive evaluation,  Documented normal MRI of the brain, cervical spine, EMG nerve conduction study, constellation of complaints, including anxiety depression, tremor, migraine headaches,  If she insisted on further neurological evaluations, let her see nurse practitioner first, before we offer any further testing.

## 2018-01-18 NOTE — Telephone Encounter (Signed)
I called pt again to discuss. No answer, left a message asking her to call me back. 

## 2018-01-21 NOTE — Telephone Encounter (Signed)
I called pt again to discuss. VM is full. Unable to leave a message. Will send pt a letter asking her to call us back since this is my third unsuccessful attempt at reaching pt by phone.  If pt calls back, please offer her an appt with Hoyle Sauer, NP. This is necessary before any further orders are placed, per Dr. Rhea Belton recommendations.

## 2018-01-22 NOTE — Telephone Encounter (Signed)
Patient returned called back and has been scheduled with Hoyle Sauer.

## 2018-01-31 ENCOUNTER — Ambulatory Visit (INDEPENDENT_AMBULATORY_CARE_PROVIDER_SITE_OTHER): Payer: Managed Care, Other (non HMO)

## 2018-01-31 DIAGNOSIS — R002 Palpitations: Secondary | ICD-10-CM | POA: Diagnosis not present
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
## 2018-02-13 ENCOUNTER — Ambulatory Visit: Payer: Managed Care, Other (non HMO) | Admitting: Nurse Practitioner

## 2018-02-13 ENCOUNTER — Encounter: Payer: Self-pay | Admitting: Nurse Practitioner

## 2018-02-13 ENCOUNTER — Telehealth: Payer: Self-pay | Admitting: *Deleted

## 2018-02-13 VITALS — BP 168/101 | HR 101 | Ht 64.0 in | Wt 215.0 lb

## 2018-02-13 DIAGNOSIS — R251 Tremor, unspecified: Secondary | ICD-10-CM

## 2018-02-13 DIAGNOSIS — G43109 Migraine with aura, not intractable, without status migrainosus: Secondary | ICD-10-CM | POA: Diagnosis not present

## 2018-02-13 MED ORDER — SUMATRIPTAN SUCCINATE 6 MG/0.5ML ~~LOC~~ SOLN: 6.0000 mg | SUBCUTANEOUS | 4 refills | Status: AC | PRN

## 2018-02-13 MED ORDER — RIZATRIPTAN BENZOATE 10 MG PO TBDP: 10.0000 mg | ORAL_TABLET | ORAL | 4 refills | Status: DC | PRN

## 2018-02-13 MED ORDER — ONDANSETRON 4 MG PO TBDP: 8.0000 mg | ORAL_TABLET | Freq: Four times a day (QID) | ORAL | 6 refills | Status: DC | PRN

## 2018-02-13 MED ORDER — NORTRIPTYLINE HCL 25 MG PO CAPS: 50.0000 mg | ORAL_CAPSULE | Freq: Every day | ORAL | 3 refills | Status: DC

## 2018-02-13 MED ORDER — PROMETHAZINE HCL 25 MG PO TABS: 25.0000 mg | ORAL_TABLET | Freq: Four times a day (QID) | ORAL | 4 refills | Status: DC | PRN

## 2018-02-13 NOTE — Patient Instructions (Signed)
Continue Pamelor 25 mg at bedtime migraine preventive Continue Zofran for nausea and Phenergan as needed Continue Maxalt and Imitrex as needed Follow-up with Dr. Krista Blue after your appointment in [place] movement center

## 2018-02-13 NOTE — Telephone Encounter (Signed)
Spoke to patient - she has pending appointments with rheumatology, dermatology in addition to a movement specialist in [place].  She would like a copy of her records.  She will be coming to our office to sign a consent forms and would like her records to be ready for pick up.

## 2018-02-14 NOTE — Progress Notes (Signed)
I have reviewed and agreed above plan.  She is to be referred to tertiary center for further evaluations

## 2018-02-22 ENCOUNTER — Telehealth: Payer: Self-pay | Admitting: Nurse Practitioner

## 2018-02-22 NOTE — Telephone Encounter (Signed)
Attempted to call pt.  VM states mailbox is full.  Will try again later.

## 2018-02-22 NOTE — Telephone Encounter (Signed)
Let's go ahead and increase her beta blocker to Lopressor 75 mg BID. Will see back as planned.  Finish wearing the monitor.   Burtis Junes, RN, [place] [address] [place], [place]  [phone]

## 2018-02-22 NOTE — Telephone Encounter (Signed)
New Message   1. Is this related to a heart monitor you are wearing?  (If the patient says no, please ask     if they are caling about ICD/pacemaker.) Yes  2. What is your issue?? (If the patient is calling for results of the heart monitor this     message should be sent to nurse.) Patient is calling in reference to her heart monitor. She states she was advised that she can send the signal when she wants the issue be recorded. She says that she then is suppose to get a call when its abnormal. Based upon that she wants to know what the limits are before she is contacted. Because she honestly believes that she has high heart rates  Please route to covering RN/CMA/RMA for results. Route to monitor technicians or your monitor tech representative for your site for any technical concerns

## 2018-02-22 NOTE — Telephone Encounter (Signed)
Spoke with patient about parameters of the monitor and when they will call her. She explained she is having episodes where her heart races for a few mins to a few hours.  I pulled some strips from the company and she is having tachycardia 130's-150's this does not meet MD notification. I told her to continue to do what she is doing and push the button for symptoms. Routing to [employer] for any recomendations

## 2018-02-27 ENCOUNTER — Other Ambulatory Visit: Payer: Self-pay | Admitting: *Deleted

## 2018-02-27 MED ORDER — METOPROLOL TARTRATE 25 MG PO TABS: 25.0000 mg | ORAL_TABLET | Freq: Two times a day (BID) | ORAL | 3 refills | Status: DC

## 2018-02-27 NOTE — Telephone Encounter (Signed)
S/w pt is agreeable to treatment plan.  Will start (25 mg ) bid of lopressor to go with (50 mg ) bid tablet.  Will continue to monitor bp and will call next week with readings. Pt stated bp has been in the 180's.  Medication list updated and sent in. Will route to Maude Leriche, LPN
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
## 2018-03-05 ENCOUNTER — Ambulatory Visit: Payer: Managed Care, Other (non HMO) | Admitting: Nurse Practitioner

## 2018-03-22 ENCOUNTER — Telehealth: Payer: Self-pay | Admitting: Nurse Practitioner

## 2018-03-22 ENCOUNTER — Telehealth: Payer: Self-pay

## 2018-03-22 NOTE — Telephone Encounter (Signed)
NEW MESSAGE    Pt c/o medication issue:  1. Name of Medication: metoprolol tartrate (LOPRESSOR)   2. How are you currently taking this medication (dosage and times per day)? n/a  3. Are you having a reaction (difficulty breathing--STAT)? No  4. What is your medication issue? Patient states the pharmacy does not have 50mg  prescription. Clarify dosage patient should be taking and send to pharmacy

## 2018-03-22 NOTE — Telephone Encounter (Signed)
Attempted to call patient. No answer and mailbox is full

## 2018-03-25 NOTE — Telephone Encounter (Signed)
Spoke with Suezanne Jacquet, pharmacist at [facility] to clarify patient's prescriptions for Lopressor. I advised that prescribed dose is Lopressor 50 mg tab plus 25 mg tab twice daily for a total of 75 mg BID. Ben verified that their system has the correct information and prescriptions and thanked me for the call.

## 2018-03-25 NOTE — Telephone Encounter (Signed)
Mail box is full will try later  

## 2018-03-26 NOTE — Telephone Encounter (Signed)
vm full, will try later.

## 2018-03-28 ENCOUNTER — Ambulatory Visit: Payer: Self-pay | Admitting: Nurse Practitioner

## 2018-04-09 ENCOUNTER — Encounter: Payer: Self-pay | Admitting: *Deleted

## 2018-04-22 ENCOUNTER — Ambulatory Visit: Payer: Managed Care, Other (non HMO) | Admitting: Neurology

## 2018-05-15 ENCOUNTER — Telehealth: Payer: Self-pay | Admitting: Nurse Practitioner

## 2018-05-15 NOTE — Telephone Encounter (Signed)
Patient is out of town and has had a headache for 2 days and she left her migraine medication at home. Can a Rx for rizatriptan (MAXALT-MLT) 10 MG disintegrating tablet and SUMAtriptan (IMITREX) 6 MG/0.5ML SOLN injection [facility] [place] [place] telephone number [phone]. She will be out of town for another week.

## 2018-05-15 NOTE — Telephone Encounter (Signed)
The [facility].  [facility] [place] [place] [facility] the [facility]ill over to their store.  They may need to call the patient's insurance for a vacation override. I spoke [facility] [place] [place] and she will need the patient's insurance information.  She went ahead a transferred the prescriptions over.  Per the pharmacist, they are allowed to accept out-of-state prescriptions from NPs with the exception of controlled substances.  I called the patient to give her this update and to let her know the pharmacy needs her insurance information.

## 2018-05-16 ENCOUNTER — Other Ambulatory Visit: Payer: Self-pay | Admitting: Nurse Practitioner

## 2018-05-28 ENCOUNTER — Ambulatory Visit: Payer: Managed Care, Other (non HMO) | Admitting: Nurse Practitioner

## 2018-05-28 ENCOUNTER — Encounter (INDEPENDENT_AMBULATORY_CARE_PROVIDER_SITE_OTHER): Payer: Self-pay

## 2018-05-28 ENCOUNTER — Encounter: Payer: Self-pay | Admitting: Nurse Practitioner

## 2018-05-28 VITALS — BP 160/100 | HR 78 | Ht 64.0 in | Wt 215.4 lb

## 2018-05-28 DIAGNOSIS — G259 Extrapyramidal and movement disorder, unspecified: Secondary | ICD-10-CM

## 2018-05-28 DIAGNOSIS — R002 Palpitations: Secondary | ICD-10-CM | POA: Diagnosis not present

## 2018-05-28 DIAGNOSIS — I10 Essential (primary) hypertension: Secondary | ICD-10-CM

## 2018-05-28 NOTE — Patient Instructions (Addendum)
We will be checking the following labs today - NONE   Medication Instructions:    Continue with your current medicines.     Testing/Procedures To Be Arranged:  Echocardiogram  Follow-Up:   Let's see what the echo shows.     Other Special Instructions:   N/A    If you need a refill on your cardiac medications before your next appointment, please call your pharmacy.   Call the [place] office at [phone] if you have any questions, problems or concerns.
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
## 2018-06-06 ENCOUNTER — Ambulatory Visit (HOSPITAL_COMMUNITY): Payer: Managed Care, Other (non HMO) | Attending: Cardiology

## 2018-06-06 ENCOUNTER — Other Ambulatory Visit: Payer: Self-pay

## 2018-06-06 DIAGNOSIS — I340 Nonrheumatic mitral (valve) insufficiency: Secondary | ICD-10-CM | POA: Insufficient documentation

## 2018-06-06 DIAGNOSIS — I10 Essential (primary) hypertension: Secondary | ICD-10-CM | POA: Diagnosis not present

## 2018-06-06 DIAGNOSIS — R002 Palpitations: Secondary | ICD-10-CM

## 2018-09-17 ENCOUNTER — Telehealth: Payer: Self-pay | Admitting: Nurse Practitioner

## 2018-09-17 NOTE — Telephone Encounter (Signed)
New Message:    Pt would like for you to call her. She wants to talk to you about her last office visit, the conversation she had with Cecille Rubin, concerning her all over health condition please.

## 2018-09-19 NOTE — Telephone Encounter (Signed)
lvm to f/u from previous phone call.

## 2018-09-25 ENCOUNTER — Telehealth: Payer: Self-pay | Admitting: Gastroenterology

## 2018-09-25 MED ORDER — OMEPRAZOLE 40 MG PO CPDR: 40.0000 mg | DELAYED_RELEASE_CAPSULE | Freq: Two times a day (BID) | ORAL | 0 refills | Status: DC

## 2018-09-25 NOTE — Telephone Encounter (Signed)
Prescription sent to patient's pharmacy and patient notified to keep appt for further refills. 

## 2018-10-01 ENCOUNTER — Ambulatory Visit: Payer: Managed Care, Other (non HMO) | Admitting: Physician Assistant

## 2018-10-01 ENCOUNTER — Encounter: Payer: Self-pay | Admitting: Physician Assistant

## 2018-10-01 VITALS — BP 160/98 | HR 104 | Ht 64.0 in | Wt 212.0 lb

## 2018-10-01 DIAGNOSIS — R1013 Epigastric pain: Secondary | ICD-10-CM | POA: Diagnosis not present

## 2018-10-01 DIAGNOSIS — R110 Nausea: Secondary | ICD-10-CM | POA: Diagnosis not present

## 2018-10-01 DIAGNOSIS — K219 Gastro-esophageal reflux disease without esophagitis: Secondary | ICD-10-CM

## 2018-10-01 MED ORDER — OMEPRAZOLE 40 MG PO CPDR: 40.0000 mg | DELAYED_RELEASE_CAPSULE | Freq: Two times a day (BID) | ORAL | 3 refills | Status: DC

## 2018-10-01 MED ORDER — ONDANSETRON HCL 4 MG PO TABS: 4.0000 mg | ORAL_TABLET | Freq: Four times a day (QID) | ORAL | 3 refills | Status: DC | PRN

## 2018-10-01 NOTE — Progress Notes (Signed)
Letter printed and will be mailed

## 2018-10-01 NOTE — Progress Notes (Signed)
Chief Complaint: Medication refill  HPI:    Deanna George is a 52 year old Caucasian female, who follows with Dr. 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      06/20/2016 seen in clinic by me.  At that time discussed abdominal pain, change in bowel habits and nausea.  At that time described recently being seen by her OB/GYN with a unremarkable pelvic exam, urinalysis unremarkable.  Transvaginal ultrasound showed left ovary with small reticular echo pattern mass consistent with a hemorrhagic cyst.  Not felt her pain is gone related.  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  Review of EGD report and images 08/06/2014 showed a variable Z line, pathology was consistent with reflux.  Last flex sig was completed 07/14/2011 with an anal fissure and internal hemorrhoids, last colonoscopy in 2010 with hyperplastic polyps.  That time described chronic right-sided abdominal pain and periumbilical pain as well as a new pain in the upper part of her rectum towards her back that felt like cramping.  Describes a change in bowel habits which are softer in consistency and "slimy".  Reflux controlled on omeprazole 40 mg daily.  Also constant nausea controlled on Zofran 8 mg every 6 hours.  Describes that the "contour of her abdomen had changed over the past 2 months".  At that time patient was arrange for EGD and  colonoscopy prior to repeat laparoscopic surgery to try to identify cause of her pain.  Her omeprazole was refilled 40 twice a day as well as ondansetron 8 mg every 6 hours and hyoscyamine sulfate was prescribed.    07/21/2016 EGD and colonoscopy.  EGD showed Z line was variable and 5 at 35 cm from the incisors, small hiatal hernia and otherwise normal to the second portion of the duodenum.  Pathology showed chronic inflammation.  Colonoscopy was normal.    Today, patient brings with her a printed out 5 page sheet of her symptoms since being seen/over the past 3 years.  She has had an increase/worsening of most of her neurological symptoms including spasms and occasionally dropping objects as well as episodes of passing out amongst many others.  She has been following with neurology about this and in fact has been referred to [facility] neurology.  Apparently they were unable to get her in and she is now trying to set up an appointment with [facility] clinic.    Overall GI wise patient tells me she ran out of her Omeprazole 40 mg twice daily and has been using Omeprazole 20 mg daily and is having an increased amount of epigastric discomfort with some heartburn and reflux symptoms.  Also tells me she continues with chronic nausea which she was battling when I first met her, she is currently using Zofran 4 mg every 6 hours when she has it on hand.    Denies other GI complaints or concerns today.  Believes most of her symptoms are related to her neurological symptoms.  She is following with a  rheumatologist to have his labs pending.    Denies fever, chills, weight loss, anorexia or change in bowel habits.  Past Medical History:  Diagnosis Date  . Anal fissure   . Cervical disc disorder   . Clostridium difficile infection   . Dysrhythmia    tachycardia  . Esophageal stricture   . GERD (gastroesophageal reflux disease)   . Headache   . Hemorrhoids   . Hypertension   . IBS (irritable bowel syndrome)   .  Interstitial cystitis   . Noncompliance    pt denies  . OA (osteoarthritis) of knee   . Palpitations   . Pneumonia   . 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 Situational stress   . Urinary tract infection     Past Surgical History:  Procedure Laterality Date  . BILATERAL OOPHORECTOMY  01/2017   Laparoscopic at [facility] minimally invasive surgery department  . BREAST EXCISIONAL BIOPSY Right 1995  . CESAREAN SECTION  1998,2000   x2  . CHOLECYSTECTOMY N/A 01/28/2013   Procedure: LAPAROSCOPIC CHOLECYSTECTOMY WITH INTRAOPERATIVE CHOLANGIOGRAM;  Surgeon: Earnstine Regal, MD;  Location: [facility];  Service: General;  Laterality: N/A;  . COLONOSCOPY    . Foot pin post fracture  09/2008   Left foot  . KNEE ARTHROSCOPY  2011    right  . LAPAROSCOPIC BILATERAL SALPINGECTOMY Bilateral 02/02/2015   Procedure: BILATERAL SALPINGECTOMY, ;  Surgeon: Anastasio Auerbach, MD;  Location: [place] [facility];  Service: Gynecology;  Laterality: Bilateral;  . Laparoscopic surgery  01/2007   uterus and ovary x 2  . LAPAROSCOPIC VAGINAL HYSTERECTOMY     02/2008  . LAPAROSCOPY N/A 02/02/2015   Procedure: LAPAROSCOPY DIAGNOSTIC, FULGERATION ENDOMETREOSIS, EXCISION RIGHT OVARIAN CYST, LYSIS OF ADHESIONS, EXCISION VULVAR AND VAGINAL CYST ;  Surgeon: Anastasio Auerbach, MD;  Location: [place] [facility];  Service: Gynecology;  Laterality: N/A;  . MOLE REMOVAL Right 2010  . US ECHOCARDIOGRAPHY  07/31/2008   EF 55-60% with mild LVH    Current Outpatient Medications  Medication Sig Dispense Refill  . cyclobenzaprine (FLEXERIL) 10 MG tablet Take 5 mg by mouth at bedtime as needed for muscle spasms.    . diazepam (VALIUM) 5 MG tablet 1/2-1 TABLET AS NEEDED EVERY 6 HOURS AS NEEDED FOR MUSCLE SPASM ORALLY 30 DAYS  0  . diphenhydrAMINE (BENADRYL) 25 mg capsule Take 25 mg by mouth every 6 (six) hours as needed for allergies.    . DULoxetine (CYMBALTA) 60 MG capsule Take 1 capsule (60 mg total) by mouth daily. 30 capsule 11  . gabapentin  (NEURONTIN) 300 MG capsule Take 300 mg by mouth at bedtime.    Marland Kitchen HYDROcodone-acetaminophen (NORCO/VICODIN) 5-325 MG tablet Take 1 tablet by mouth every 6 (six) hours as needed for moderate pain. 12 tablet 0  . ketorolac (TORADOL) 10 MG tablet Take 10 mg by mouth every 6 (six) hours as needed for moderate pain.    Marland Kitchen losartan (COZAAR) 50 MG tablet TAKE 1 TABLET BY MOUTH TWICE A DAY 180 tablet 1  . Melatonin 10 MG CAPS Take 10 mg by mouth at bedtime.    . metoprolol tartrate (LOPRESSOR) 50 MG tablet Take 1 tablet (50 mg total) by mouth 2 (two) times daily. 180 tablet 3  . Miconazole Nitrate (MONISTAT 7 VA) Apply 1 application topically as needed (for skin breakouts).    . nortriptyline (PAMELOR) 25 MG capsule Take 2 capsules (50 mg total) by mouth at bedtime. 180 capsule 3  . omeprazole (PRILOSEC)  40 MG capsule Take 1 capsule (40 mg total) by mouth 2 (two) times daily. 60 capsule 0  . ondansetron (ZOFRAN) 4 MG tablet Take 4 mg by mouth every 8 (eight) hours as needed for nausea or vomiting.    . rizatriptan (MAXALT-MLT) 10 MG disintegrating tablet Take 1 tablet (10 mg total) by mouth as needed. May repeat in 2 hours if needed 15 tablet 4  . SUMAtriptan (IMITREX) 6 MG/0.5ML SOLN injection Inject 0.5 mLs (6 mg total) into the skin as needed for migraine. May repeat in 2 hours if headache persists or recurs. 10 vial 4  . traMADol (ULTRAM) 50 MG tablet TAKE ONE TABLET BY MOUTH AS NEEDED FOR HEADACHE 30 tablet 5   No current facility-administered medications for this visit.     Allergies as of 10/01/2018  . (No Known Allergies)    Family History  Adopted: Yes  Problem Relation Age of Onset  . Colon cancer Neg Hx   . Pancreatic cancer Neg Hx   . Stomach cancer Neg Hx   . Rectal cancer Neg Hx     Social History   Socioeconomic History  . Marital status: Legally Separated    Spouse name: Not on file  . Number of children: 2  . Years of education: Not on file  . Highest education level: Not  on file  Occupational History  . Occupation: Ship broker    Comment: graduated 2018  Social Needs  . Financial resource strain: Not on file  . Food insecurity:    Worry: Not on file    Inability: Not on file  . Transportation needs:    Medical: Not on file    Non-medical: Not on file  Tobacco Use  . Smoking status: Never Smoker  . Smokeless tobacco: Never Used  Substance and Sexual Activity  . Alcohol use: Yes    Comment: Rare  . Drug use: No  . Sexual activity: Never  Lifestyle  . Physical activity:    Days per week: Not on file    Minutes per session: Not on file  . Stress: Not on file  Relationships  . Social connections:    Talks on phone: Not on file    Gets together: Not on file    Attends religious service: Not on file    Active member of club or organization: Not on file    Attends meetings of clubs or organizations: Not on file    Relationship status: Not on file  . Intimate partner violence:    Fear of current or ex partner: Not on file    Emotionally abused: Not on file    Physically abused: Not on file    Forced sexual activity: Not on file  Other Topics Concern  . Not on file  Social History Narrative  . Not on file    Review of Systems:    Constitutional: No weight loss, fever or chills Cardiovascular: No chest pain Respiratory: No SOB  Gastrointestinal: See HPI and otherwise negative   Physical Exam:  Vital signs: BP (!) 160/98   Pulse (!) 104   Ht _0  (1.626 m)   Wt 212 lb (96.2 kg)   BMI 36.39 kg/m   Constitutional:   Pleasant Caucasian female appears to be in NAD, Well developed, Well nourished, alert and cooperative Respiratory: Respirations even and unlabored. Lungs clear to auscultation bilaterally.   No wheezes, crackles, or rhonchi.  Cardiovascular: Normal S1, S2. No MRG. Regular rate and rhythm. No  peripheral edema, cyanosis or pallor.  Gastrointestinal:  Soft, nondistended, mild epigastric/periumbilical ttp. No rebound or guarding.  Normal bowel sounds. No appreciable masses or hepatomegaly. Psychiatric: Demonstrates good judgement and reason without abnormal affect or behaviors.  No recent labs or imaging available for my review.  Assessment: 1.  Epigastric pain/GERD: This is typically fairly well controlled on omeprazole 40 mg twice daily, patient has run out, EGD in 2017 with chronic gastritis 2.  Nausea: Chronic for the patient, question relation to neurological symptoms worse above  Plan: 1.  Refilled Zofran 8 mg every 6 hours #60 with 3 refills 2.  Refilled Omeprazole 20 mg daily, 30-60 minutes before breakfast #90 with 3 refills 3.  Discussed with patient I do believe there may be some overlap between her neurological symptoms and her GI symptoms.  I encouraged her to get follow-up with neurology at a tertiary center.  We will provide her with a letter stating this, per her request. 4.  Patient to follow in clinic with [place] as needed in the future.  Deanna Newer, PA-C [facility] 10/01/2018, 3:00 PM  Cc: [name], MD

## 2018-10-01 NOTE — Patient Instructions (Signed)
We have sent the following medications to your pharmacy for you to pick up at your convenience: omeprazole and zofran.

## 2018-10-02 MED ORDER — OMEPRAZOLE 40 MG PO CPDR: 40.0000 mg | DELAYED_RELEASE_CAPSULE | Freq: Every day | ORAL | 11 refills | Status: DC

## 2018-10-02 NOTE — Addendum Note (Signed)
Addended by: Marzella Schlein on: 10/02/2018 12:34 PM   Modules accepted: Orders

## 2018-10-02 NOTE — Telephone Encounter (Signed)
Patient states she was told to increase to twice daily omeprazole 40 mg but her insurance company will not cover that. She states we need to write the script for once daily and she will just take it twice daily. Informed patient that she will run out really quickly but she can take the prescription dose once daily but take OTC omeprazole 20 mg (2 tablets) once daily to make up for the other dose. Patient states she will do that. Prescription sent to patient's pharmacy and patient verbalized understanding.

## 2018-10-02 NOTE — Telephone Encounter (Signed)
Tells me her insurance will not approve omeprazole because it can not say take twice a day? Still needs to take 2 of them but order must say to take once a day. The patient is telling me this, she also tells me she really needs this done as soon as possible because she needs this medication.

## 2018-10-02 NOTE — Progress Notes (Signed)
Reviewed and agree with initial management plan.  Quinterious Walraven T. Tanner Yeley, MD FACG 

## 2018-10-30 ENCOUNTER — Encounter: Payer: Self-pay | Admitting: Nurse Practitioner

## 2018-10-30 ENCOUNTER — Telehealth: Payer: Self-pay | Admitting: Nurse Practitioner

## 2018-10-30 NOTE — Telephone Encounter (Signed)
New message  Pt c/o BP issue: STAT if pt c/o blurred vision, one-sided weakness or slurred speech  1. What are your last 5 BP readings? 170/80 10/29/18    2. Are you having any other symptoms (ex. Dizziness, headache, blurred vision, passed out)? Dizziness, nausea   3. What is your BP issue? Patient's bp is elevated     Patient is asking for a letter to the [facility] in [place].  Patient states that this has been discussed in a previous visit. Please advise.

## 2018-10-30 NOTE — Telephone Encounter (Signed)
S/w pt is aware of letter that is being left upfront for pt Deanna George wrote today to  support pt's neurologic evaluation at the [facility].  Pt stated appreciation and also stated is getting divorce does not have health insurance at this time. Also left with letter is financial paperwork for pt to fill out if needs health assistance.

## 2018-10-31 NOTE — Telephone Encounter (Signed)
Letter routed via epic  to Dr [name] at pt's request .Adonis Housekeeper

## 2018-10-31 NOTE — Telephone Encounter (Signed)
Follow up     Pt said that she is coming by to pick up letter written by Cecille Rubin but she wants to know if it can be faxed because she doesn't have time to get it before her 315 appt with her pcp.

## 2019-01-08 ENCOUNTER — Ambulatory Visit: Payer: Managed Care, Other (non HMO) | Admitting: Neurology

## 2019-01-20 ENCOUNTER — Other Ambulatory Visit: Payer: Self-pay

## 2019-01-20 NOTE — Telephone Encounter (Signed)
Pt's phone number is no longer a working number. Cecille Rubin stated wait till pt's calls in to refill medication.

## 2019-01-20 NOTE — Telephone Encounter (Signed)
S/w Barnabas Lister at [phone] [facility] in [place], Cecille Rubin changed losartan to valsartan one tablet by mouth (160 mg ) daily, stated having a hard time getting this also.  Barnabas Lister stated olmesartan?? This medication isn't reliable either.  Stated will t/w provider and route, will call back pharmacy.

## 2019-01-20 NOTE — Telephone Encounter (Signed)
Try alternate pharmacy please.   Burtis Junes, RN, [place] [address] [place], [place]  [phone]

## 2019-01-20 NOTE — Telephone Encounter (Signed)
Pharmacy states that Losartan is on back ordered with all strengths and wanted to know if a different medication can be sent in for pt. Please advise.

## 2019-01-22 ENCOUNTER — Telehealth: Payer: Self-pay | Admitting: *Deleted

## 2019-01-22 NOTE — Telephone Encounter (Signed)
Refused pts medication in refill pool till pt call office back than can have metoprolol per Cecille Rubin.

## 2019-02-26 ENCOUNTER — Telehealth: Payer: Self-pay | Admitting: Nurse Practitioner

## 2019-02-26 NOTE — Telephone Encounter (Signed)
Pt is asking for a refill on her medications rizatriptan (MAXALT-MLT) 10 MG disintegrating tablet  traMADol (ULTRAM) 50 MG tablet  Promethazine Pt using [facility] on [address] in [place], [place]

## 2019-02-27 ENCOUNTER — Telehealth: Payer: Self-pay | Admitting: Neurology

## 2019-02-27 NOTE — Telephone Encounter (Signed)
Pt has consented to a Virtual Visit. Pt is self pay. An email has been added.

## 2019-02-27 NOTE — Telephone Encounter (Signed)
This patient was last seen 02/13/2018.  Promethazine is not currently an active medication on her list.  Tramadol was last prescribed on 12/19/16 w/ 5 refills.  Rizatriptan last prescribed on 02/13/18 w/ 4 refills.   She has a pending appt with Dr. Krista Blue.  She needs to be seen sooner to discuss her medication regimen.    I attempted to reach the patient this twice this morning.  There was no answer and her voicemail is full.  When she calls back, please offer an appt time for virtual visit with Butler Denmark, NP.  We will need to explain the process and get her email address.  Judson Roch will be able to see her today so that her medications can be reviewed.  Dr. Rhea Belton schedule is full.

## 2019-02-27 NOTE — Telephone Encounter (Addendum)
I tried calling the patient for a third time.  I was unable to reach her or leave a message.  The patient's current appt is not until 04/03/2019.  She needs to be seen sooner to discuss her medications.

## 2019-02-27 NOTE — Telephone Encounter (Signed)
I have attempted to reach patient for the fourth time. No answer and I am still unable to leave a message. She has an appt on 04/03/2019 but needs a sooner date to discuss medication management and refills.  When she calls back, please offer an appt time for virtual visit with Butler Denmark, NP.  We will need to explain the process and get her email address.  Judson Roch will be able to see her quickly, even today, if she calls back soon.

## 2019-03-03 NOTE — Telephone Encounter (Signed)
Spoke with the patient and I was able to schedule her a webex visit with Butler Denmark, NP on 03/06/2019 at 12:45 pm. Verbal consent to file her insurance and e-mail has been sent.

## 2019-03-06 ENCOUNTER — Other Ambulatory Visit: Payer: Self-pay

## 2019-03-06 ENCOUNTER — Encounter: Payer: Self-pay | Admitting: Neurology

## 2019-03-06 ENCOUNTER — Ambulatory Visit (INDEPENDENT_AMBULATORY_CARE_PROVIDER_SITE_OTHER): Payer: Self-pay | Admitting: Neurology

## 2019-03-06 ENCOUNTER — Ambulatory Visit: Payer: Medicaid Other | Admitting: Neurology

## 2019-03-06 DIAGNOSIS — G43109 Migraine with aura, not intractable, without status migrainosus: Secondary | ICD-10-CM

## 2019-03-06 MED ORDER — TRAMADOL HCL 50 MG PO TABS: ORAL_TABLET | ORAL | 0 refills | Status: DC

## 2019-03-06 MED ORDER — PROMETHAZINE HCL 25 MG PO TABS: 25.0000 mg | ORAL_TABLET | Freq: Three times a day (TID) | ORAL | 3 refills | Status: DC | PRN

## 2019-03-06 MED ORDER — RIZATRIPTAN BENZOATE 10 MG PO TBDP: 10.0000 mg | ORAL_TABLET | ORAL | 4 refills | Status: AC | PRN

## 2019-03-06 MED ORDER — NORTRIPTYLINE HCL 25 MG PO CAPS: 50.0000 mg | ORAL_CAPSULE | Freq: Every day | ORAL | 6 refills | Status: DC
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
## 2019-03-07 ENCOUNTER — Telehealth: Payer: Self-pay | Admitting: Neurology

## 2019-03-07 NOTE — Telephone Encounter (Signed)
I talked with Deanna George yesterday via VV. She is requesting a letter from Dr. Krista Blue providing support for her evaluation at the [facility] for a multitude of concerns including, arm tremors, passing out spells, migraine headaches, memory loss, past medical history. She reports she has already received a letter of support form her GI doctor, cardiologist, and was going to plan to have one from here primary care doctor. Dr. Krista Blue has referred Deanna George to Dr. Linus Mako, [facility] Specialist, his findings include, functional arm tremors, as part of functional neurological disorders. He further recommends referral to dermatologist for skin biopsy and rheumatology to rule out connective tissue disorder. Also, psychotherapist and psychiatrist who has experience managing conversion disorders.   I have let Deanna George know that I will discuss with Dr. Krista Blue and determine if we can provide some documentation to assist in her her referral to the [facility].

## 2019-03-27 NOTE — Telephone Encounter (Signed)
Letter generated by Dr. Krista Blue and placed in mail to patient's home address.

## 2019-03-31 NOTE — Progress Notes (Signed)
I have reviewed and agreed above plan. 

## 2019-04-03 ENCOUNTER — Ambulatory Visit: Payer: Medicaid Other | Admitting: Neurology

## 2019-04-03 ENCOUNTER — Encounter

## 2019-07-29 ENCOUNTER — Encounter: Payer: Self-pay | Admitting: Psychiatry

## 2019-07-29 ENCOUNTER — Ambulatory Visit: Payer: Self-pay | Admitting: Psychiatry

## 2019-07-29 NOTE — Progress Notes (Signed)
Admin note for No-show or Short Notice Cancellation  Patient ID: Deanna George  MRN: [id number] DATE: 07/29/2019  No show for 11am initial eval.  Chart shows she prescheduled 3 visits on a self-pay basis, with no effective coverage (Medicaid) and office unable to retrieve her referral information.  Chart review for April neurology visit raises question whether this is an appropriate referral, given her complex medical and trauma history, assessment of functional movement disorder, unfulfilled referrals to specialists in [place] and att he [facility], and actively seeking SSD.    Discussed new Pt NS policy with staff, who will call her to inform prescheduled visits must be cancelled due to NS.  HX of seeing a former colleague [name] [facility]) who is known to operate an out-of-pocket practice nearby.  Familiarity may be an advantage to her case.  Other referrals available if desired.  [name], PhD

## 2019-08-05 ENCOUNTER — Other Ambulatory Visit: Payer: Self-pay

## 2019-08-05 ENCOUNTER — Ambulatory Visit (INDEPENDENT_AMBULATORY_CARE_PROVIDER_SITE_OTHER): Payer: Self-pay | Admitting: Psychiatry

## 2019-08-05 ENCOUNTER — Ambulatory Visit: Payer: Self-pay | Admitting: Psychiatry

## 2019-08-05 DIAGNOSIS — F4001 Agoraphobia with panic disorder: Secondary | ICD-10-CM

## 2019-08-05 DIAGNOSIS — R296 Repeated falls: Secondary | ICD-10-CM

## 2019-08-05 DIAGNOSIS — G43109 Migraine with aura, not intractable, without status migrainosus: Secondary | ICD-10-CM

## 2019-08-05 DIAGNOSIS — F332 Major depressive disorder, recurrent severe without psychotic features: Secondary | ICD-10-CM

## 2019-08-05 DIAGNOSIS — F819 Developmental disorder of scholastic skills, unspecified: Secondary | ICD-10-CM

## 2019-08-05 DIAGNOSIS — F4310 Post-traumatic stress disorder, unspecified: Secondary | ICD-10-CM
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
## 2019-08-06 ENCOUNTER — Encounter: Payer: Self-pay | Admitting: Gynecology

## 2019-08-11 ENCOUNTER — Ambulatory Visit: Payer: Self-pay | Admitting: Psychiatry

## 2019-08-19 ENCOUNTER — Ambulatory Visit: Payer: Medicaid Other | Admitting: Adult Health

## 2019-08-21 ENCOUNTER — Other Ambulatory Visit: Payer: Self-pay

## 2019-08-21 ENCOUNTER — Ambulatory Visit (INDEPENDENT_AMBULATORY_CARE_PROVIDER_SITE_OTHER): Payer: Self-pay | Admitting: Psychiatry

## 2019-08-21 DIAGNOSIS — R296 Repeated falls: Secondary | ICD-10-CM

## 2019-08-21 DIAGNOSIS — G43109 Migraine with aura, not intractable, without status migrainosus: Secondary | ICD-10-CM

## 2019-08-21 DIAGNOSIS — F332 Major depressive disorder, recurrent severe without psychotic features: Secondary | ICD-10-CM

## 2019-08-21 DIAGNOSIS — F4310 Post-traumatic stress disorder, unspecified: Secondary | ICD-10-CM

## 2019-08-21 DIAGNOSIS — F819 Developmental disorder of scholastic skills, unspecified: Secondary | ICD-10-CM

## 2019-08-21 DIAGNOSIS — R251 Tremor, unspecified: Secondary | ICD-10-CM

## 2019-08-21 DIAGNOSIS — F4001 Agoraphobia with panic disorder: Secondary | ICD-10-CM

## 2019-08-21 DIAGNOSIS — R69 Illness, unspecified: Secondary | ICD-10-CM
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
## 2019-08-26 ENCOUNTER — Other Ambulatory Visit: Payer: Self-pay

## 2019-08-26 ENCOUNTER — Ambulatory Visit (INDEPENDENT_AMBULATORY_CARE_PROVIDER_SITE_OTHER): Payer: Medicaid Other | Admitting: Adult Health

## 2019-08-26 ENCOUNTER — Encounter: Payer: Self-pay | Admitting: Adult Health

## 2019-08-26 VITALS — BP 134/87 | HR 94 | Ht 64.0 in | Wt 200.0 lb

## 2019-08-26 DIAGNOSIS — F909 Attention-deficit hyperactivity disorder, unspecified type: Secondary | ICD-10-CM

## 2019-08-26 DIAGNOSIS — G4700 Insomnia, unspecified: Secondary | ICD-10-CM

## 2019-08-26 DIAGNOSIS — F411 Generalized anxiety disorder: Secondary | ICD-10-CM

## 2019-08-26 DIAGNOSIS — F4310 Post-traumatic stress disorder, unspecified: Secondary | ICD-10-CM

## 2019-08-26 DIAGNOSIS — F331 Major depressive disorder, recurrent, moderate: Secondary | ICD-10-CM

## 2019-08-26 NOTE — Progress Notes (Signed)
[facility] MD/PA/NP Initial Note  08/26/2019 1:59 PM Deanna George  MRN:  [id number]  Chief Complaint:  Chief Complaint    Anxiety; Depression; Trauma; Insomnia; Panic Attack; ADHD      HPI:   Describes mood today as "not so good". Pleasant. Nervous. Mood symptoms - reports depression, anxiety, and irritability. Having panic attacks. Stating "sleep is a big problem". Worries "all the time". Stating "I need some type of relief from all this". Dealing with multiple stressors. Concerned about finances. May have to sell home and is looking for a RV. Has a functional disability assessment scheduled for this upcoming Saturday. Relays a long history of "traumatic events". Feels like these events are "always haunting me". Stating I don't want to feel this way anymore". Stable interest and motivation. Taking medications as prescribed.  Energy levels "poor". Active, does not have a regular exercise routine. Disabled. Enjoys some usual interests and activities. Likes to watch TV. Lives alone with cat. Lost dog of 10 years in April of this year. Felt like her dog "sensed things in her". Also has 2 parakeets.  Appetite adequate. Weight gain since last surgery - trying to lose weight - can't do anything physically. Not using "utensils" anymore.  Not sleeping well most nights. Can go 2 to 3 days without sleep. Then goes into a "coma sleep".  Averages 4 to 5 hours. Cat waking her up most mornings. Having flashbacks and nightmares.  Focus and concentration difficulties - diagnosed with ADHD when 53 years old. Difficulties "driving". Took a shower today for the first time in 5 days. Can't do more than one thing at a time. Recently did laundry after 3 weeks. Stating "I'm a Taurus" and like to keep things clean, but I physically don't have the energy. Has days when she can get things done. Completing some tasks. Managing aspects of household "when I can".  Denies SI or HI. Denies AH or VH.  Previous medications:  Ritalin and Thorazine - others  Visit Diagnosis:    ICD-10-CM   1. Insomnia, unspecified type  G47.00   2. Major depressive disorder, recurrent episode, moderate (HCC)  F33.1   3. Generalized anxiety disorder  F41.1   4. PTSD (post-traumatic stress disorder)  F43.10   5. Attention deficit hyperactivity disorder (ADHD), unspecified ADHD type  F90.9     Past Psychiatric History: Previous psychiatric admission.  Past Medical History:  Past Medical History:  Diagnosis Date  . Anal fissure   . Cervical disc disorder   . Clostridium difficile infection   . Dysrhythmia    tachycardia  . Esophageal stricture   . GERD (gastroesophageal reflux disease)   . Headache   . Hemorrhoids   . Hypertension   . IBS (irritable bowel syndrome)   . Interstitial cystitis   . Noncompliance    pt denies  . OA (osteoarthritis) of knee   . Palpitations   . Pneumonia   . 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 Situational stress   . Urinary tract infection     Past Surgical History:  Procedure Laterality Date  . BILATERAL OOPHORECTOMY  01/2017   Laparoscopic at [facility] minimally invasive surgery department  . BREAST EXCISIONAL BIOPSY Right 1995  . CESAREAN SECTION  1998,2000   x2  . CHOLECYSTECTOMY N/A 01/28/2013   Procedure: LAPAROSCOPIC CHOLECYSTECTOMY WITH INTRAOPERATIVE CHOLANGIOGRAM;  Surgeon: Earnstine Regal, MD;  Location: [facility];  Service: General;  Laterality: N/A;  . COLONOSCOPY    . Foot  pin post fracture  09/2008   Left foot  . KNEE ARTHROSCOPY  2011    right  . LAPAROSCOPIC BILATERAL SALPINGECTOMY Bilateral 02/02/2015   Procedure: BILATERAL SALPINGECTOMY, ;  Surgeon: Anastasio Auerbach, MD;  Location: [facility];  Service: Gynecology;  Laterality: Bilateral;  . Laparoscopic surgery  01/2007   uterus and ovary x 2  . LAPAROSCOPIC VAGINAL HYSTERECTOMY     02/2008  . LAPAROSCOPY N/A 02/02/2015   Procedure: LAPAROSCOPY DIAGNOSTIC, FULGERATION ENDOMETREOSIS, EXCISION RIGHT OVARIAN  CYST, LYSIS OF ADHESIONS, EXCISION VULVAR AND VAGINAL CYST ;  Surgeon: Anastasio Auerbach, MD;  Location: [facility];  Service: Gynecology;  Laterality: N/A;  . MOLE REMOVAL Right 2010  . US ECHOCARDIOGRAPHY  07/31/2008   EF 55-60% with mild LVH    Family Psychiatric History: Multiple "issues".   Family History:  Family History  Adopted: Yes  Problem Relation Age of Onset  . Colon cancer Neg Hx   . Pancreatic cancer Neg Hx   . Stomach cancer Neg Hx   . Rectal cancer Neg Hx     Social History:  Social History   Socioeconomic History  . Marital status: Legally Separated    Spouse name: Not on file  . Number of children: 2  . Years of education: Not on file  . Highest education level: Not on file  Occupational History  . Occupation: Ship broker    Comment: graduated 2018  Social Needs  . Financial resource strain: Not on file  . Food insecurity    Worry: Not on file    Inability: Not on file  . Transportation needs    Medical: Not on file    Non-medical: Not on file  Tobacco Use  . Smoking status: Current Every Day Smoker  . Smokeless tobacco: Never Used  Substance and Sexual Activity  . Alcohol use: Not Currently    Comment: Rare  . Drug use: Not Currently  . Sexual activity: Never  Lifestyle  . Physical activity    Days per week: Not on file    Minutes per session: Not on file  . Stress: Not on file  Relationships  . Social Herbalist on phone: Not on file    Gets together: Not on file    Attends religious service: Not on file    Active member of club or organization: Not on file    Attends meetings of clubs or organizations: Not on file    Relationship status: Not on file  Other Topics Concern  . Not on file  Social History Narrative  . Not on file    Allergies: No Known Allergies  Metabolic Disorder Labs: No results found for: HGBA1C, MPG No results found for: PROLACTIN Lab Results  Component Value Date   CHOL 173 08/18/2014   TRIG 81  08/18/2014   HDL 47 08/18/2014   CHOLHDL 3.7 08/18/2014   VLDL 16 08/18/2014   LDLCALC 110 (H) 08/18/2014   LDLCALC 102 (H) 09/05/2011   Lab Results  Component Value Date   TSH 0.830 03/27/2017   TSH 0.62 06/12/2016    Therapeutic Level Labs: No results found for: LITHIUM No results found for: VALPROATE No components found for:  CBMZ  Current Medications: Current Outpatient Medications  Medication Sig Dispense Refill  . albuterol (PROAIR HFA) 108 (90 Base) MCG/ACT inhaler 2 puffs as needed    . calcium carbonate (TUMS EX) 750 MG chewable tablet Chew 2 tablets by mouth 3 (three) times daily.    Marland Kitchen  Cholecalciferol (VITAMIN D3) 50 MCG (2000 UT) capsule 1 capsule    . cyclobenzaprine (FLEXERIL) 10 MG tablet TAKE 1 TABLET BY MOUTH EVERY 8 HOURS AS NEEDED FOR 30 DAYS    . diazepam (VALIUM) 5 MG tablet Take 5 mg by mouth 2 (two) times daily as needed. for anxiety    . diphenhydrAMINE (BENADRYL) 25 mg capsule Take 25 mg by mouth every 6 (six) hours as needed for allergies.    . DULoxetine (CYMBALTA) 30 MG capsule Take 90 mg by mouth daily.    . DULoxetine (CYMBALTA) 60 MG capsule Take 90 mg by mouth daily.    Marland Kitchen HYDROcodone-acetaminophen (NORCO/VICODIN) 5-325 MG tablet Take 1 tablet by mouth every 6 (six) hours as needed for moderate pain. (Patient not taking: Reported on 03/06/2019) 12 tablet 0  . ibuprofen (ADVIL) 800 MG tablet 1 tablet with food or milk as needed    . ketorolac (TORADOL) 10 MG tablet Take 10 mg by mouth every 6 (six) hours as needed for moderate pain.    Marland Kitchen losartan (COZAAR) 50 MG tablet TAKE 1 TABLET BY MOUTH TWICE A DAY 180 tablet 1  . losartan (COZAAR) 50 MG tablet 1 tablet    . Meclizine HCl (BONINE) 25 MG CHEW Chew by mouth 2 (two) times daily as needed.    . Melatonin 10 MG CAPS Take 10 mg by mouth at bedtime.    . metoprolol tartrate (LOPRESSOR) 50 MG tablet Take 1 tablet (50 mg total) by mouth 2 (two) times daily. 180 tablet 3  . nortriptyline (PAMELOR) 25 MG  capsule Take 2 capsules (50 mg total) by mouth at bedtime. 60 capsule 6  . omeprazole (PRILOSEC OTC) 20 MG tablet 1 tablet    . omeprazole (PRILOSEC) 40 MG capsule Take 1 capsule (40 mg total) by mouth daily. 30 capsule 11  . ondansetron (ZOFRAN) 4 MG tablet Take 1 tablet (4 mg total) by mouth every 6 (six) hours as needed for nausea or vomiting. 60 tablet 3  . ondansetron (ZOFRAN-ODT) 4 MG disintegrating tablet 1 tablet on the tongue and allow to dissolve as needed    . Probiotic Product (PROBIOTIC-10 PO) Take by mouth daily.    . promethazine (PHENERGAN) 25 MG tablet Take 1 tablet (25 mg total) by mouth every 8 (eight) hours as needed for nausea or vomiting. 30 tablet 3  . rizatriptan (MAXALT-MLT) 10 MG disintegrating tablet Take 1 tablet (10 mg total) by mouth as needed. May repeat in 2 hours if needed 15 tablet 4  . SUMAtriptan (IMITREX) 6 MG/0.5ML SOLN injection Inject 0.5 mLs (6 mg total) into the skin as needed for migraine. May repeat in 2 hours if headache persists or recurs. 10 vial 4  . traMADol (ULTRAM) 50 MG tablet TAKE ONE TABLET BY MOUTH AS NEEDED FOR HEADACHE 30 tablet 0  . traZODone (DESYREL) 150 MG tablet Take 150 mg by mouth at bedtime.     No current facility-administered medications for this visit.     Medication Side Effects: none  Orders placed this visit:  No orders of the defined types were placed in this encounter.   Psychiatric Specialty Exam:  ROS  Height 5\' 4"  (1.626 m), weight 200 lb (90.7 kg).Body mass index is 34.33 kg/m.  General Appearance: Neat and Well Groomed  Eye Contact:  Good  Speech:  Clear and Coherent  Volume:  Normal  Mood:  Anxious, Depressed, Hopeless and Irritable  Affect:  Congruent  Thought Process:  Coherent  Orientation:  Full (Time, Place, and Person)  Thought Content: Logical   Suicidal Thoughts:  No  Homicidal Thoughts:  No  Memory:  WNL  Judgement:  Fair  Insight:  Fair  Psychomotor Activity:  Restlessness  Concentration:   Concentration: Fair  Recall:  NA  Fund of Knowledge: Good  Language: Good  Assets:  Communication Skills Desire for Improvement Housing Physical Health Resilience Transportation Vocational/Educational  ADL's:  Intact  Cognition: WNL  Prognosis:  Fair   Screenings: None  Receiving Psychotherapy: Yes   Treatment Plan/Recommendations:   Continue: 1. Cymbalta 120mg  daily 2. Trazadone 150mg  at hs 3. Valium 5mg  BID prn  Patient to leave history for review. Will read and present options to patient at next visit.  Continue therapy with [name]  RTC 4 weeks  Patient advised to contact office with any questions, adverse effects, or acute worsening in signs and symptoms.  Discussed potential benefits, risk, and side effects of benzodiazepines to include potential risk of tolerance and dependence, as well as possible drowsiness.  Advised patient not to drive if experiencing drowsiness and to take lowest possible effective dose to minimize risk of dependence and tolerance.    Aloha Gell, NP

## 2019-08-28 ENCOUNTER — Other Ambulatory Visit: Payer: Self-pay

## 2019-08-28 ENCOUNTER — Ambulatory Visit (INDEPENDENT_AMBULATORY_CARE_PROVIDER_SITE_OTHER): Payer: Self-pay | Admitting: Psychiatry

## 2019-08-28 ENCOUNTER — Telehealth: Payer: Self-pay | Admitting: Adult Health

## 2019-08-28 DIAGNOSIS — R296 Repeated falls: Secondary | ICD-10-CM

## 2019-08-28 DIAGNOSIS — F331 Major depressive disorder, recurrent, moderate: Secondary | ICD-10-CM

## 2019-08-28 DIAGNOSIS — F4001 Agoraphobia with panic disorder: Secondary | ICD-10-CM

## 2019-08-28 DIAGNOSIS — F411 Generalized anxiety disorder: Secondary | ICD-10-CM

## 2019-08-28 DIAGNOSIS — G4700 Insomnia, unspecified: Secondary | ICD-10-CM

## 2019-08-28 DIAGNOSIS — R69 Illness, unspecified: Secondary | ICD-10-CM

## 2019-08-28 DIAGNOSIS — F4310 Post-traumatic stress disorder, unspecified: Secondary | ICD-10-CM

## 2019-08-28 DIAGNOSIS — R251 Tremor, unspecified: Secondary | ICD-10-CM

## 2019-08-28 DIAGNOSIS — G43109 Migraine with aura, not intractable, without status migrainosus: Secondary | ICD-10-CM

## 2019-08-28 NOTE — Telephone Encounter (Signed)
Pt requesting a phone call to discuss meds you discussed on her last office visit. She wanted to start this regimen before her next office visit.

## 2019-08-28 NOTE — Telephone Encounter (Signed)
Pt was told to reach out to you per A. Mitchum during her appt on 10/15.

## 2019-08-28 NOTE — Progress Notes (Signed)
Psychotherapy Progress Note [facility] [name], PhD LP  Patient ID: Deanna George     MRN: [id number]     Therapy format: Individual psychotherapy Date: 08/28/2019     Start: 2:10p Stop: 3:00p Time Spent: 50 min Location: in-person   Session narrative (presenting needs, interim history, self-report of stressors and symptoms, applications of prior therapy, status changes, and interventions made in session) Has seen new prescriber, Garwin Brothers, NP of this office, earlier this week for transfer of med mgmt, reports functional assessment for disability coming up Saturday, prominent worry and insomnia.  [place] shared materials with her prior to this visit, but looks like not feasible to take a more integrated history at the time.  Meds continued at Cymbalta 120mg  QPM, trazodone 150mg  qhs, valium 5mg  BID prn, and PT last known to use 60mg  melatonin in vain hope of enforcing sleep, alongside aroma and other OTC therapies.  While high-dose Cymbalta is indicated for pain management, I am concerned that she does not have sufficient antianxiety action and continue to suspect an alpha blocker will be helpful.  Med f/u expected c. 1 mo.  Says they did discuss future possibility of alpha blocker and/or another agent.  Last night took muscle relaxer, trazodone, 30mg  melatonin, etc., and still did not fall asleep until 6am.  Sometimes takes 2 Benadryl.  Briefly discussed risks of polypharmacy, anxious self-medication, and reviewed rationales for which meds at which times.  Current living space -- owns home but may sell out to move into a mobile home to improve savings she can live on awaiting disability application.  Having functional assessment this Saturday -- scares her.  Reviewed lead findings to make sure she represents them: tremors, fatigue, easily exhausted, brain zaps (lost concentration, lost ability to figure out procedures), chronic migraines, and involuntarily falling asleep multiple  times a day.  This week, had a crying jag before seeing prescriber on Monday, had to smoke and use GPS to get home for being blocked remembering how to get home.  Vacuuming at home also fatigued her to the point of not being able to lift her arm.  Knows she has run greater risk of collapse, narcoleptic-like episodes, seizures, when she overspends her energy, advised on pacing and trying to make sure she does not overspend energy.  Much easier said than done.  Psychoeducation provided on hippocampal suppression, ice cube techniques, box breathing for understanding and more control over anxiety and agoraphobia.  Re. former [place], says she dropped by last week to leave more personal disclosure and to sign ROI for former [place].  Last TX seen almost a year ago (est. November, could have been this Jan/Feb).  Was told she needed to get psychiatry started in order to continue counseling, saw [name], then could not get back in, as stated before.    Therapeutic modalities: Cognitive Behavioral Therapy and Solution-Oriented/Positive Psychology  Mental Status/Observations:  Appearance:   Casual     Behavior:  tense  Motor:  tense  Speech/Language:   normal artic, pace  Affect:  anxious, responsive, beginning to relax  Mood:  anxious and depressed  Thought process:  less tangential  Thought content:    WNL, worries  Sensory/Perceptual disturbances:    WNL  Orientation:  grossly intact  Attention:  Fair  Concentration:  Fair  Memory:  grossly intact  Insight:    Good  Judgment:   Fair  Impulse Control:  Fair   Risk Assessment: Danger to Self: No  Self-injurious Behavior: No Danger to Others: No Physical Aggression / Violence: No Duty to Warn: No Access to Firearms a concern: No  Assessment of progress:  stabilized  Diagnosis:   ICD-10-CM   1. Insomnia, unspecified type  G47.00   2. PTSD (post-traumatic stress disorder)  F43.10   3. Generalized anxiety disorder  F41.1   4. Panic disorder  with agoraphobia and moderate panic attacks  F40.01   5. Major depressive disorder, recurrent episode, moderate (HCC)  F33.1   6. Migraine with aura and without status migrainosus, not intractable  G43.109   7. Unexplained falls  R29.6   8. Physiological tremor  R25.1   9. Suspected condition  R69    possible insulin resistance or mitochondrial condition    Plan:  . Use ice -- in hands or mouth -- for focus/wakefulness, antiobsession, and tolerating dissociative anxiety . Inform prescriber she is interested in beginning alpha blocker . Will get in touch with former [place] for coordination of care and to assess loss of contact -- suspect it is a misunderstanding rooted in TX's personal circumstances and PT jumping to conclusions about unavailability . Other recommendations/advice as noted above . Continue to utilize previously learned skills ad lib . Maintain medication as prescribed and work faithfully with relevant prescriber(s) if any changes are desired or seem indicated . Call the clinic on-call service, present to ER, or call 911 if any life-threatening psychiatric crisis Return in about 1 week (around 09/04/2019).  [name], PhD [name], PhD LP Clinical Psychologist, [facility] [address] [place], [place] [address] [phone]

## 2019-08-29 MED ORDER — PRAZOSIN HCL 1 MG PO CAPS: 1.0000 mg | ORAL_CAPSULE | Freq: Every day | ORAL | 1 refills | Status: DC

## 2019-08-29 NOTE — Telephone Encounter (Signed)
Left voicemail Rx submitted and to call back with concerns or questions.

## 2019-08-29 NOTE — Addendum Note (Signed)
Addended by: Aloha Gell on: 08/29/2019 12:42 PM   Modules accepted: Orders

## 2019-09-04 ENCOUNTER — Ambulatory Visit: Payer: Medicaid Other | Admitting: Psychiatry

## 2019-09-10 ENCOUNTER — Telehealth: Payer: Self-pay | Admitting: Nurse Practitioner

## 2019-09-10 ENCOUNTER — Telehealth: Payer: Self-pay | Admitting: Neurology

## 2019-09-10 NOTE — Telephone Encounter (Signed)
S/w pt wanted to share with Cecille Rubin pt is now at [facility] with new physiatrics and counselor.  Pt is officially diagnosed with PTSD.  Pt's bp over the last four days has been in the 170"s.  Pt had a function disability test and bp was 194 over something, this was with a wrist cuff.  Asked pt several time if pt's cuff has ever been checked never got an answer.  Pt does have a arm cuff and while on phone pt's bp was 146/85  HR 85.  Has a long list of symptoms but none mentioned with chest pain, sob or dizziness. Pt is on food stamps and trying to get medicaid.  Stated would send to Cecille Rubin to advise and if pt has a problem getting to office maybe telehealth visit since pt does not drive and daughter takes pt everywhere. Sending via Epic to [place].

## 2019-09-10 NOTE — Telephone Encounter (Signed)
Noted. I will see her at her f/u appointment.

## 2019-09-10 NOTE — Telephone Encounter (Signed)
Pt called in and stated she has a new psychiatrist and a new consuler , she wanted to inform she is now taking Cymbalta 120mg  a day , she is on Prazosin 1mg  for her PTSD . She states she had her functional ability test and her tremors has increased in the last 6-66months. She doesn't drive as much anymore because she cant control her movements.

## 2019-09-10 NOTE — Telephone Encounter (Signed)
New message:     Patient calling concering some medications that she got from another primary. Patient BP is running a little high. Please call patient.

## 2019-09-10 NOTE — Telephone Encounter (Signed)
Do we know exactly what her medicines are?  Deanna George

## 2019-09-10 NOTE — Telephone Encounter (Signed)
Made changes to medlist.  She has appt 10-20-19 with SS/NP.

## 2019-09-11 ENCOUNTER — Other Ambulatory Visit: Payer: Self-pay

## 2019-09-11 ENCOUNTER — Ambulatory Visit (INDEPENDENT_AMBULATORY_CARE_PROVIDER_SITE_OTHER): Payer: Self-pay | Admitting: Psychiatry

## 2019-09-11 DIAGNOSIS — F909 Attention-deficit hyperactivity disorder, unspecified type: Secondary | ICD-10-CM

## 2019-09-11 DIAGNOSIS — F4310 Post-traumatic stress disorder, unspecified: Secondary | ICD-10-CM

## 2019-09-11 DIAGNOSIS — R296 Repeated falls: Secondary | ICD-10-CM

## 2019-09-11 DIAGNOSIS — R69 Illness, unspecified: Secondary | ICD-10-CM

## 2019-09-11 DIAGNOSIS — G4700 Insomnia, unspecified: Secondary | ICD-10-CM

## 2019-09-11 DIAGNOSIS — F411 Generalized anxiety disorder: Secondary | ICD-10-CM

## 2019-09-11 DIAGNOSIS — F332 Major depressive disorder, recurrent severe without psychotic features: Secondary | ICD-10-CM

## 2019-09-11 DIAGNOSIS — F4001 Agoraphobia with panic disorder: Secondary | ICD-10-CM

## 2019-09-11 DIAGNOSIS — R251 Tremor, unspecified: Secondary | ICD-10-CM

## 2019-09-11 NOTE — Telephone Encounter (Signed)
Why don't we see if we can get one of our BP cuffs sent to her and let her check with that.  Then we can decide if she needs medicines adjusted.   Deanna George

## 2019-09-11 NOTE — Progress Notes (Signed)
Psychotherapy Progress Note [facility] [name], PhD LP  Patient ID: Deanna George     MRN: [id number]     Therapy format: Individual psychotherapy Date: 09/11/2019     Start: 2:10p Stop: 3:01p Time Spent: 51 min Location: in-person   Session narrative (presenting needs, interim history, self-report of stressors and symptoms, applications of prior therapy, status changes, and interventions made in session) Got flu shot, felt a little punk.  Started prazosin Sunday night but was awake till 7 or 8am.  Took it earlier , c. 6:30pm Monday, also up all night.  Daytimes involve conking out, up and down.    Functional evaluation 2 Saturdays ago was nerve-wracking, with BP up, tremors, tear-drenched, stuttering.  Daughter Summer refreshingly supportive, PT mortified about her own anxiety, imbalance, falls, muscle weakness, etc., though it did illustrate what she is dealing with.  Very hard for PT to appreciate the point, still self-shaming about being   Informed TX got in touch with her former [place], affirmed that he is entirely willing to hear from her, make good on any missed requests and did not in any way mean to "ghost" her as she has thought for 8-10 months now.  Denies anger but hard to dislodge from rehashing her history of texting and calling, which turns out to have stopped in December, with one more call in 02/07/2023 when sF died.  Supportively challenged the way "trauma brain" processed it, and how   Asks on behalf of her disability case worker that TC write up a brief statement of disability. Called DDS worker in session, took contact and request information and clarified need.  C/o forgetfulness -- brings evidence in three identical hairbands she successively bought from [place] without realizing it.  Educated again about hippocampal suppression in prolonged severe stress as likely basis, assured her it is normal, not "crazy" but requires reducing stress and restoring  sleep.  Cont to use ice for grounding and staying awake behind the wheel.     Therapeutic modalities: Cognitive Behavioral Therapy, Ego-Supportive and Psycho-education/Bibliotherapy  Mental Status/Observations:  Appearance:   Casual     Behavior:  Appropriate  Motor:  Tremor  Speech/Language:   Clear and Coherent  Affect:  Constricted and anxious  Mood:  anxious  Thought process:  generally intact, some rigidity  Thought content:    Rumination  Sensory/Perceptual disturbances:    none stated  Orientation:  grossly intact  Attention:  Fair  Concentration:  Fair  Memory:  grossly intact  Insight:    Fair  Judgment:   Fair  Impulse Control:  Fair   Risk Assessment: Danger to Self: No Self-injurious Behavior: No Danger to Others: No Physical Aggression / Violence: No Duty to Warn: No Access to Firearms a concern: No  Assessment of progress:  stabilized  Diagnosis:   ICD-10-CM   1. PTSD (post-traumatic stress disorder)  F43.10   2. Insomnia, unspecified type  G47.00    severe  3. Generalized anxiety disorder  F41.1    severe  4. Panic disorder with agoraphobia and moderate panic attacks  F40.01   5. Severe episode of recurrent major depressive disorder, without psychotic features ([place])  F33.2   6. Attention deficit hyperactivity disorder (ADHD), unspecified ADHD type  F90.9   7. Unexplained falls  R29.6   8. Physiological tremor  R25.1   9. Suspected condition  R69    stress-induced hippocampal suppression    Plan:  . Continue with prazosin taper as  Rx'd . Try to apply breathing, other self-soothing skills, return to calming/grounding skills next visit . Continue to use ice as needed for grounding and wakefulness while driving . Self-affirm about former [place] and make the call to get necessary, corrective experience vs. believing the worst . Other recommendations/advice as noted above . Continue to utilize previously learned skills ad lib . Maintain medication as  prescribed and work faithfully with relevant prescriber(s) if any changes are desired or seem indicated . Call the clinic on-call service, present to ER, or call 911 if any life-threatening psychiatric crisis Return in about 1 week (around 09/18/2019).  [name], PhD [name], PhD LP Clinical Psychologist, [facility] [address] [place], [place] [address] [phone]

## 2019-09-15 ENCOUNTER — Ambulatory Visit: Payer: Self-pay | Admitting: Neurology

## 2019-09-15 NOTE — Telephone Encounter (Signed)
lvm for pt to call office to see if pt can pick up bp cuff.

## 2019-09-16 ENCOUNTER — Ambulatory Visit (INDEPENDENT_AMBULATORY_CARE_PROVIDER_SITE_OTHER): Payer: Self-pay | Admitting: Psychiatry

## 2019-09-16 ENCOUNTER — Other Ambulatory Visit: Payer: Self-pay

## 2019-09-16 DIAGNOSIS — F4310 Post-traumatic stress disorder, unspecified: Secondary | ICD-10-CM

## 2019-09-16 DIAGNOSIS — F819 Developmental disorder of scholastic skills, unspecified: Secondary | ICD-10-CM

## 2019-09-16 DIAGNOSIS — G4700 Insomnia, unspecified: Secondary | ICD-10-CM

## 2019-09-16 DIAGNOSIS — F331 Major depressive disorder, recurrent, moderate: Secondary | ICD-10-CM

## 2019-09-16 DIAGNOSIS — F4001 Agoraphobia with panic disorder: Secondary | ICD-10-CM

## 2019-09-16 DIAGNOSIS — F909 Attention-deficit hyperactivity disorder, unspecified type: Secondary | ICD-10-CM

## 2019-09-16 DIAGNOSIS — F411 Generalized anxiety disorder: Secondary | ICD-10-CM
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
## 2019-09-18 ENCOUNTER — Telehealth (INDEPENDENT_AMBULATORY_CARE_PROVIDER_SITE_OTHER): Payer: Self-pay | Admitting: Psychiatry

## 2019-09-18 DIAGNOSIS — G4700 Insomnia, unspecified: Secondary | ICD-10-CM

## 2019-09-18 DIAGNOSIS — F4310 Post-traumatic stress disorder, unspecified: Secondary | ICD-10-CM
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
## 2019-09-23 ENCOUNTER — Ambulatory Visit (INDEPENDENT_AMBULATORY_CARE_PROVIDER_SITE_OTHER): Payer: Self-pay | Admitting: Adult Health

## 2019-09-23 ENCOUNTER — Other Ambulatory Visit: Payer: Self-pay

## 2019-09-23 ENCOUNTER — Encounter: Payer: Self-pay | Admitting: Adult Health

## 2019-09-23 DIAGNOSIS — F411 Generalized anxiety disorder: Secondary | ICD-10-CM

## 2019-09-23 DIAGNOSIS — F4001 Agoraphobia with panic disorder: Secondary | ICD-10-CM

## 2019-09-23 DIAGNOSIS — F4310 Post-traumatic stress disorder, unspecified: Secondary | ICD-10-CM

## 2019-09-23 DIAGNOSIS — F909 Attention-deficit hyperactivity disorder, unspecified type: Secondary | ICD-10-CM

## 2019-09-23 DIAGNOSIS — G4700 Insomnia, unspecified: Secondary | ICD-10-CM

## 2019-09-23 MED ORDER — TRAZODONE HCL 100 MG PO TABS: ORAL_TABLET | ORAL | 2 refills | Status: DC

## 2019-09-23 MED ORDER — DIAZEPAM 5 MG PO TABS: 5.0000 mg | ORAL_TABLET | Freq: Two times a day (BID) | ORAL | 2 refills | Status: DC | PRN

## 2019-09-23 NOTE — Progress Notes (Signed)
[facility] MD/PA/NP Initial Note  09/23/2019 3:36 PM Deanna George  MRN:  [id number]  Chief Complaint:    HPI:    Describes mood today as "about the same". Pleasant. Mood symptoms - reports depression, anxiety, and irritability. Ongoing panic attacks. Increased worry and rumination - "at times". Feels like lack of sleep keeping her "down". Continues to have nightmares and restlessness throughout the night. Does not feel like the Prazosin has been helpful. Willing to make "adjustments". Stating "I haven't seen any difference with adding the Prazosin". Does not feel like Trazadone is helpful. Concerned about finances - having to pay "out of pocket for care". Has applied for disability. Continues to work with Dr. Rica Mote. Varying interest and motivation. Taking medications as prescribed.  Energy levels "low". Active, does not have a regular exercise routine. Disabled. Enjoys some usual interests and activities. Lives alone with cat. Has 2 parakeets. Watching TV. Appetite adequate. Weight gain. Trying to lose weight but is unable to exercise. Also lacks the interest and motivation to "do much of anything". Stating "I struggle physically". Continues to have issues with sleep.  Not sleeping well most nights. Averages 4 to 5 hours - flashbacks and nightmares.  Focus and concentration difficulties - diagnosed with ADHD when 53 years old. Not performing self care regularly. Going for days and weeks without bathing. Pushing herself to get laundry done - household tasks. Completing some tasks. Managing aspects of household "a little at a time".  Denies SI or HI. Denies AH or VH.  Previous medications: Ritalin and Thorazine - others  Visit Diagnosis:    ICD-10-CM   1. Insomnia, unspecified type  G47.00 traZODone (DESYREL) 100 MG tablet    diazepam (VALIUM) 5 MG tablet  2. Generalized anxiety disorder  F41.1 diazepam (VALIUM) 5 MG tablet  3. Panic disorder with agoraphobia and moderate panic attacks   F40.01 diazepam (VALIUM) 5 MG tablet    Past Psychiatric History: Previous psychiatric admission.  Past Medical History:  Past Medical History:  Diagnosis Date  . Anal fissure   . Cervical disc disorder   . Clostridium difficile infection   . Dysrhythmia    tachycardia  . Esophageal stricture   . GERD (gastroesophageal reflux disease)   . Headache   . Hemorrhoids   . Hypertension   . IBS (irritable bowel syndrome)   . Interstitial cystitis   . Noncompliance    pt denies  . OA (osteoarthritis) of knee   . Palpitations   . Pneumonia   . 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 Situational stress   . Urinary tract infection     Past Surgical History:  Procedure Laterality Date  . BILATERAL OOPHORECTOMY  01/2017   Laparoscopic at [facility] minimally invasive surgery department  . BREAST EXCISIONAL BIOPSY Right 1995  . CESAREAN SECTION  1998,2000   x2  . CHOLECYSTECTOMY N/A 01/28/2013   Procedure: LAPAROSCOPIC CHOLECYSTECTOMY WITH INTRAOPERATIVE CHOLANGIOGRAM;  Surgeon: Earnstine Regal, MD;  Location: [facility];  Service: General;  Laterality: N/A;  . COLONOSCOPY    . Foot pin post fracture  09/2008   Left foot  . KNEE ARTHROSCOPY  2011    right  . LAPAROSCOPIC BILATERAL SALPINGECTOMY Bilateral 02/02/2015   Procedure: BILATERAL SALPINGECTOMY, ;  Surgeon: Anastasio Auerbach, MD;  Location: [facility];  Service: Gynecology;  Laterality: Bilateral;  . Laparoscopic surgery  01/2007   uterus and ovary x 2  . LAPAROSCOPIC VAGINAL HYSTERECTOMY     02/2008  .  LAPAROSCOPY N/A 02/02/2015   Procedure: LAPAROSCOPY DIAGNOSTIC, FULGERATION ENDOMETREOSIS, EXCISION RIGHT OVARIAN CYST, LYSIS OF ADHESIONS, EXCISION VULVAR AND VAGINAL CYST ;  Surgeon: Anastasio Auerbach, MD;  Location: [facility];  Service: Gynecology;  Laterality: N/A;  . MOLE REMOVAL Right 2010  . US ECHOCARDIOGRAPHY  07/31/2008   EF 55-60% with mild LVH    Family Psychiatric History: Multiple "issues".   Family History:  Family  History  Adopted: Yes  Problem Relation Age of Onset  . Colon cancer Neg Hx   . Pancreatic cancer Neg Hx   . Stomach cancer Neg Hx   . Rectal cancer Neg Hx     Social History:  Social History   Socioeconomic History  . Marital status: Legally Separated    Spouse name: Not on file  . Number of children: 2  . Years of education: Not on file  . Highest education level: Not on file  Occupational History  . Occupation: Ship broker    Comment: graduated 2018  Social Needs  . Financial resource strain: Not on file  . Food insecurity    Worry: Not on file    Inability: Not on file  . Transportation needs    Medical: Not on file    Non-medical: Not on file  Tobacco Use  . Smoking status: Current Every Day Smoker  . Smokeless tobacco: Never Used  Substance and Sexual Activity  . Alcohol use: Not Currently    Comment: Rare  . Drug use: Not Currently  . Sexual activity: Never  Lifestyle  . Physical activity    Days per week: Not on file    Minutes per session: Not on file  . Stress: Not on file  Relationships  . Social Herbalist on phone: Not on file    Gets together: Not on file    Attends religious service: Not on file    Active member of club or organization: Not on file    Attends meetings of clubs or organizations: Not on file    Relationship status: Not on file  Other Topics Concern  . Not on file  Social History Narrative  . Not on file    Allergies: No Known Allergies  Metabolic Disorder Labs: No results found for: HGBA1C, MPG No results found for: PROLACTIN Lab Results  Component Value Date   CHOL 173 08/18/2014   TRIG 81 08/18/2014   HDL 47 08/18/2014   CHOLHDL 3.7 08/18/2014   VLDL 16 08/18/2014   LDLCALC 110 (H) 08/18/2014   LDLCALC 102 (H) 09/05/2011   Lab Results  Component Value Date   TSH 0.830 03/27/2017   TSH 0.62 06/12/2016    Therapeutic Level Labs: No results found for: LITHIUM No results found for: VALPROATE No  components found for:  CBMZ  Current Medications: Current Outpatient Medications  Medication Sig Dispense Refill  . albuterol (PROAIR HFA) 108 (90 Base) MCG/ACT inhaler 2 puffs as needed    . calcium carbonate (TUMS EX) 750 MG chewable tablet Chew 2 tablets by mouth 3 (three) times daily.    . Cholecalciferol (VITAMIN D3) 50 MCG (2000 UT) capsule 1 capsule    . cyclobenzaprine (FLEXERIL) 10 MG tablet TAKE 1 TABLET BY MOUTH EVERY 8 HOURS AS NEEDED FOR 30 DAYS    . diazepam (VALIUM) 5 MG tablet Take 1 tablet (5 mg total) by mouth 2 (two) times daily as needed. for anxiety 60 tablet 2  . diphenhydrAMINE (BENADRYL) 25 mg capsule Take 25  mg by mouth every 6 (six) hours as needed for allergies.    . DULoxetine (CYMBALTA) 30 MG capsule Take 120 mg by mouth daily.     . DULoxetine (CYMBALTA) 60 MG capsule Take 90 mg by mouth daily.    Marland Kitchen HYDROcodone-acetaminophen (NORCO/VICODIN) 5-325 MG tablet Take 1 tablet by mouth every 6 (six) hours as needed for moderate pain. (Patient not taking: Reported on 03/06/2019) 12 tablet 0  . ibuprofen (ADVIL) 800 MG tablet 1 tablet with food or milk as needed    . ketorolac (TORADOL) 10 MG tablet Take 10 mg by mouth every 6 (six) hours as needed for moderate pain.    Marland Kitchen losartan (COZAAR) 50 MG tablet TAKE 1 TABLET BY MOUTH TWICE A DAY 180 tablet 1  . losartan (COZAAR) 50 MG tablet 1 tablet    . Meclizine HCl (BONINE) 25 MG CHEW Chew by mouth 2 (two) times daily as needed.    . Melatonin 10 MG CAPS Take 10 mg by mouth at bedtime.    . metoprolol tartrate (LOPRESSOR) 50 MG tablet Take 1 tablet (50 mg total) by mouth 2 (two) times daily. 180 tablet 3  . nortriptyline (PAMELOR) 25 MG capsule Take 2 capsules (50 mg total) by mouth at bedtime. 60 capsule 6  . omeprazole (PRILOSEC OTC) 20 MG tablet 1 tablet    . omeprazole (PRILOSEC) 40 MG capsule Take 1 capsule (40 mg total) by mouth daily. 30 capsule 11  . ondansetron (ZOFRAN) 4 MG tablet Take 1 tablet (4 mg total) by mouth  every 6 (six) hours as needed for nausea or vomiting. 60 tablet 3  . ondansetron (ZOFRAN-ODT) 4 MG disintegrating tablet 1 tablet on the tongue and allow to dissolve as needed    . prazosin (MINIPRESS) 1 MG capsule Take 1 capsule (1 mg total) by mouth at bedtime. 30 capsule 1  . Probiotic Product (PROBIOTIC-10 PO) Take by mouth daily.    . promethazine (PHENERGAN) 25 MG tablet Take 1 tablet (25 mg total) by mouth every 8 (eight) hours as needed for nausea or vomiting. 30 tablet 3  . rizatriptan (MAXALT-MLT) 10 MG disintegrating tablet Take 1 tablet (10 mg total) by mouth as needed. May repeat in 2 hours if needed 15 tablet 4  . SUMAtriptan (IMITREX) 6 MG/0.5ML SOLN injection Inject 0.5 mLs (6 mg total) into the skin as needed for migraine. May repeat in 2 hours if headache persists or recurs. 10 vial 4  . traMADol (ULTRAM) 50 MG tablet TAKE ONE TABLET BY MOUTH AS NEEDED FOR HEADACHE 30 tablet 0  . traZODone (DESYREL) 100 MG tablet Take two tablets at bedtime. 60 tablet 2   No current facility-administered medications for this visit.     Medication Side Effects: none  Orders placed this visit:  No orders of the defined types were placed in this encounter.   Psychiatric Specialty Exam:  ROS  There were no vitals taken for this visit.There is no height or weight on file to calculate BMI.  General Appearance: Neat and Well Groomed  Eye Contact:  Good  Speech:  Clear and Coherent  Volume:  Normal  Mood:  Anxious, Depressed, Hopeless and Irritable  Affect:  Congruent  Thought Process:  Coherent  Orientation:  Full (Time, Place, and Person)  Thought Content: Logical   Suicidal Thoughts:  No  Homicidal Thoughts:  No  Memory:  WNL  Judgement:  Fair  Insight:  Fair  Psychomotor Activity:  Restlessness  Concentration:  Concentration:  Fair  Recall:  NA  Fund of Knowledge: Good  Language: Good  Assets:  Communication Skills Desire for Improvement Housing Physical  Health Resilience Transportation Vocational/Educational  ADL's:  Intact  Cognition: WNL  Prognosis:  Fair   Screenings: None  Receiving Psychotherapy: Yes   Treatment Plan/Recommendations:   Continue: 1. Cymbalta 120mg  daily 2. Trazadone 150mg  at hs to 200mg  at hs 3. Valium 5mg  BID prn  Continue therapy with Deanna George  RTC 4 weeks  Patient advised to contact office with any questions, adverse effects, or acute worsening in signs and symptoms.  Discussed potential benefits, risk, and side effects of benzodiazepines to include potential risk of tolerance and dependence, as well as possible drowsiness.  Advised patient not to drive if experiencing drowsiness and to take lowest possible effective dose to minimize risk of dependence and tolerance.    Aloha Gell, NP

## 2019-09-25 ENCOUNTER — Other Ambulatory Visit: Payer: Self-pay | Admitting: Adult Health

## 2019-09-30 ENCOUNTER — Ambulatory Visit (INDEPENDENT_AMBULATORY_CARE_PROVIDER_SITE_OTHER): Payer: Self-pay | Admitting: Psychiatry

## 2019-09-30 ENCOUNTER — Other Ambulatory Visit: Payer: Self-pay

## 2019-09-30 DIAGNOSIS — F411 Generalized anxiety disorder: Secondary | ICD-10-CM

## 2019-09-30 DIAGNOSIS — K219 Gastro-esophageal reflux disease without esophagitis: Secondary | ICD-10-CM

## 2019-09-30 DIAGNOSIS — F5104 Psychophysiologic insomnia: Secondary | ICD-10-CM

## 2019-09-30 DIAGNOSIS — F4001 Agoraphobia with panic disorder: Secondary | ICD-10-CM

## 2019-09-30 DIAGNOSIS — F4310 Post-traumatic stress disorder, unspecified: Secondary | ICD-10-CM

## 2019-09-30 DIAGNOSIS — F332 Major depressive disorder, recurrent severe without psychotic features: Secondary | ICD-10-CM
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
## 2019-10-08 ENCOUNTER — Telehealth: Payer: Self-pay | Admitting: Adult Health

## 2019-10-08 ENCOUNTER — Other Ambulatory Visit: Payer: Self-pay | Admitting: Adult Health

## 2019-10-08 NOTE — Telephone Encounter (Signed)
Pt called stating her Trazodone was increased to 200mg  for sleep. She has decreased it back to 100mg  she couldn't sleep with either strength. She decreased the dosage because of horrible night terrors that she's unable to pull herself out of. She would like someone to call her, and also convey this to Dr Rica Mote.

## 2019-10-14 ENCOUNTER — Ambulatory Visit (INDEPENDENT_AMBULATORY_CARE_PROVIDER_SITE_OTHER): Payer: Self-pay | Admitting: Psychiatry

## 2019-10-14 ENCOUNTER — Other Ambulatory Visit: Payer: Self-pay

## 2019-10-14 DIAGNOSIS — F819 Developmental disorder of scholastic skills, unspecified: Secondary | ICD-10-CM

## 2019-10-14 DIAGNOSIS — F4001 Agoraphobia with panic disorder: Secondary | ICD-10-CM

## 2019-10-14 DIAGNOSIS — R69 Illness, unspecified: Secondary | ICD-10-CM

## 2019-10-14 DIAGNOSIS — R296 Repeated falls: Secondary | ICD-10-CM

## 2019-10-14 DIAGNOSIS — R251 Tremor, unspecified: Secondary | ICD-10-CM

## 2019-10-14 DIAGNOSIS — G43109 Migraine with aura, not intractable, without status migrainosus: Secondary | ICD-10-CM

## 2019-10-14 DIAGNOSIS — F4310 Post-traumatic stress disorder, unspecified: Secondary | ICD-10-CM

## 2019-10-14 DIAGNOSIS — F332 Major depressive disorder, recurrent severe without psychotic features: Secondary | ICD-10-CM

## 2019-10-14 DIAGNOSIS — F5104 Psychophysiologic insomnia: Secondary | ICD-10-CM

## 2019-10-14 NOTE — Telephone Encounter (Signed)
Have called pt. No answer. Coming in 12/1 to see AM. LM for her to move appt up
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
## 2019-10-20 ENCOUNTER — Telehealth: Payer: Self-pay | Admitting: Neurology
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
## 2019-10-21 ENCOUNTER — Ambulatory Visit: Payer: Medicaid Other | Admitting: Adult Health

## 2019-10-22 ENCOUNTER — Other Ambulatory Visit: Payer: Self-pay | Admitting: Gastroenterology

## 2019-10-23 ENCOUNTER — Ambulatory Visit: Payer: Self-pay | Admitting: Psychiatry

## 2019-10-28 ENCOUNTER — Encounter: Payer: Self-pay | Admitting: Adult Health

## 2019-10-28 ENCOUNTER — Ambulatory Visit (INDEPENDENT_AMBULATORY_CARE_PROVIDER_SITE_OTHER): Payer: Self-pay | Admitting: Psychiatry

## 2019-10-28 ENCOUNTER — Ambulatory Visit (INDEPENDENT_AMBULATORY_CARE_PROVIDER_SITE_OTHER): Payer: Medicaid Other | Admitting: Adult Health

## 2019-10-28 ENCOUNTER — Other Ambulatory Visit: Payer: Self-pay

## 2019-10-28 DIAGNOSIS — F4001 Agoraphobia with panic disorder: Secondary | ICD-10-CM

## 2019-10-28 DIAGNOSIS — G4700 Insomnia, unspecified: Secondary | ICD-10-CM

## 2019-10-28 DIAGNOSIS — F411 Generalized anxiety disorder: Secondary | ICD-10-CM

## 2019-10-28 DIAGNOSIS — F332 Major depressive disorder, recurrent severe without psychotic features: Secondary | ICD-10-CM

## 2019-10-28 DIAGNOSIS — R251 Tremor, unspecified: Secondary | ICD-10-CM

## 2019-10-28 DIAGNOSIS — F4310 Post-traumatic stress disorder, unspecified: Secondary | ICD-10-CM

## 2019-10-28 DIAGNOSIS — F909 Attention-deficit hyperactivity disorder, unspecified type: Secondary | ICD-10-CM

## 2019-10-28 DIAGNOSIS — F5104 Psychophysiologic insomnia: Secondary | ICD-10-CM

## 2019-10-28 DIAGNOSIS — F331 Major depressive disorder, recurrent, moderate: Secondary | ICD-10-CM

## 2019-10-28 DIAGNOSIS — R296 Repeated falls: Secondary | ICD-10-CM

## 2019-10-28 MED ORDER — PRAZOSIN HCL 2 MG PO CAPS: 2.0000 mg | ORAL_CAPSULE | Freq: Every day | ORAL | 2 refills | Status: DC

## 2019-10-28 MED ORDER — QUETIAPINE FUMARATE 50 MG PO TABS: 50.0000 mg | ORAL_TABLET | Freq: Every day | ORAL | 2 refills | Status: DC
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
## 2019-10-28 NOTE — Progress Notes (Signed)
CAMIAH PETRIE [id number] [birth year] 53 y.o.  Subjective:   Patient ID:  Deanna George is a 53 y.o. (DOB Aug 22, 1966) female.  Chief Complaint: No chief complaint on file.   HPI   Deanna George presents to the office today for follow-up of MDD, PTSD, GAD, panic disorder, insomnia  Describes mood today as "about the same". Pleasant. Mood symptoms - reports depression, anxiety, and irritability. Daily panic attacks. Awaiting a decision about disability. Hoping she is approved. Concerned about being able to take care of herself financially. Has started withdrawing fromher last "retirement" account. Mostly staying at home. Only leaving the house for appointments. Multiple medical issues. Limited self care. May go 9 to 10 days without doing dishes and laundry. Has difficulties driving. Stating "It's hard for me to keep my hands on the steering wheel". Feels overwhelmed when driving. Has to take an anti-nausea medication before driving. Forgetting where she is going when she leaves the house. Doesn't know "who she is from day to day". [place] out of bed recently. Has learned to leave her body when "horrific" things happen to her. Continues to work with Dr. Rica Mote. Varying interest and motivation. Taking medications as prescribed.  Energy levels "low". Active, does not have a regular exercise routine. Disabled. Enjoys some usual interests and activities. Lives alone with cat. Has 2 parakeets. Watching TV. Appetite adequate. Weight gain.  Reports difficulties with sleep. Not able to get to sleep. Averages 3 to 4 hours. Has ability to rejoin dreams. Has a lot of night terrors. Can't control what her brain wants to dream about. Feels like drugs work but nightmares and flashbacks are more intense. Has a lot of "anxiety" with sleeping. Wakes up "nauseas and throwing up". Feels like she relives things. Hard to get up in the mornings.  Focus and concentration difficulties - diagnosed with ADHD when 53 years old. Has  not taken any medications recently. Completing some tasks. Managing very few aspects of household. Denies SI or HI. Denies AH or VH.  Previous medications: Ritalin and Thorazine - others  Review of Systems:  Review of Systems  Musculoskeletal: Negative for gait problem.  Neurological: Negative for tremors.  Psychiatric/Behavioral:       Please refer to HPI    Medications: I have reviewed the patient's current medications.  Current Outpatient Medications  Medication Sig Dispense Refill  . albuterol (PROAIR HFA) 108 (90 Base) MCG/ACT inhaler 2 puffs as needed    . calcium carbonate (TUMS EX) 750 MG chewable tablet Chew 2 tablets by mouth 3 (three) times daily.    . Cholecalciferol (VITAMIN D3) 50 MCG (2000 UT) capsule 1 capsule    . cyclobenzaprine (FLEXERIL) 10 MG tablet TAKE 1 TABLET BY MOUTH EVERY 8 HOURS AS NEEDED FOR 30 DAYS    . diazepam (VALIUM) 5 MG tablet Take 1 tablet (5 mg total) by mouth 2 (two) times daily as needed. for anxiety 60 tablet 2  . diphenhydrAMINE (BENADRYL) 25 mg capsule Take 25 mg by mouth every 6 (six) hours as needed for allergies.    . DULoxetine (CYMBALTA) 30 MG capsule Take 120 mg by mouth daily.     . DULoxetine (CYMBALTA) 60 MG capsule Take 90 mg by mouth daily.    Marland Kitchen HYDROcodone-acetaminophen (NORCO/VICODIN) 5-325 MG tablet Take 1 tablet by mouth every 6 (six) hours as needed for moderate pain. (Patient not taking: Reported on 03/06/2019) 12 tablet 0  . ibuprofen (ADVIL) 800 MG tablet 1 tablet with food or milk  as needed    . ketorolac (TORADOL) 10 MG tablet Take 10 mg by mouth every 6 (six) hours as needed for moderate pain.    Marland Kitchen losartan (COZAAR) 50 MG tablet TAKE 1 TABLET BY MOUTH TWICE A DAY 180 tablet 1  . losartan (COZAAR) 50 MG tablet 1 tablet    . Meclizine HCl (BONINE) 25 MG CHEW Chew by mouth 2 (two) times daily as needed.    . Melatonin 10 MG CAPS Take 10 mg by mouth at bedtime.    . metoprolol tartrate (LOPRESSOR) 50 MG tablet Take 1 tablet  (50 mg total) by mouth 2 (two) times daily. 180 tablet 3  . nortriptyline (PAMELOR) 25 MG capsule Take 2 capsules (50 mg total) by mouth at bedtime. 60 capsule 6  . omeprazole (PRILOSEC OTC) 20 MG tablet 1 tablet    . omeprazole (PRILOSEC) 40 MG capsule Take 1 capsule by mouth once daily 30 capsule 0  . ondansetron (ZOFRAN) 4 MG tablet Take 1 tablet (4 mg total) by mouth every 6 (six) hours as needed for nausea or vomiting. 60 tablet 3  . ondansetron (ZOFRAN-ODT) 4 MG disintegrating tablet 1 tablet on the tongue and allow to dissolve as needed    . prazosin (MINIPRESS) 1 MG capsule TAKE 1 CAPSULE (1 MG TOTAL) BY MOUTH AT BEDTIME. 30 capsule 1  . Probiotic Product (PROBIOTIC-10 PO) Take by mouth daily.    . promethazine (PHENERGAN) 25 MG tablet Take 1 tablet (25 mg total) by mouth every 8 (eight) hours as needed for nausea or vomiting. 30 tablet 3  . rizatriptan (MAXALT-MLT) 10 MG disintegrating tablet Take 1 tablet (10 mg total) by mouth as needed. May repeat in 2 hours if needed 15 tablet 4  . SUMAtriptan (IMITREX) 6 MG/0.5ML SOLN injection Inject 0.5 mLs (6 mg total) into the skin as needed for migraine. May repeat in 2 hours if headache persists or recurs. 10 vial 4  . traMADol (ULTRAM) 50 MG tablet TAKE ONE TABLET BY MOUTH AS NEEDED FOR HEADACHE 30 tablet 0  . traZODone (DESYREL) 100 MG tablet Take two tablets at bedtime. 60 tablet 2   No current facility-administered medications for this visit.    Medication Side Effects: None  Allergies: No Known Allergies  Past Medical History:  Diagnosis Date  . Anal fissure   . Cervical disc disorder   . Clostridium difficile infection   . Dysrhythmia    tachycardia  . Esophageal stricture   . GERD (gastroesophageal reflux disease)   . Headache   . Hemorrhoids   . Hypertension   . IBS (irritable bowel syndrome)   . Interstitial cystitis   . Noncompliance    pt denies  . OA (osteoarthritis) of knee   . Palpitations   . Pneumonia   .  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 Situational stress   . Urinary tract infection     Family History  Adopted: Yes  Problem Relation Age of Onset  . Colon cancer Neg Hx   . Pancreatic cancer Neg Hx   . Stomach cancer Neg Hx   . Rectal cancer Neg Hx     Social History   Socioeconomic History  . Marital status: Legally Separated    Spouse name: Not on file  . Number of children: 2  . Years of education: Not on file  . Highest education level: Not on file  Occupational History  . Occupation: Ship broker    Comment: graduated  2018  Tobacco Use  . Smoking status: Current Every Day Smoker  . Smokeless tobacco: Never Used  Substance and Sexual Activity  . Alcohol use: Not Currently    Comment: Rare  . Drug use: Not Currently  . Sexual activity: Never  Other Topics Concern  . Not on file  Social History Narrative  . Not on file   Social Determinants of Health   Financial Resource Strain:   . Difficulty of Paying Living Expenses: Not on file  Food Insecurity:   . Worried About Charity fundraiser in the Last Year: Not on file  . Ran Out of Food in the Last Year: Not on file  Transportation Needs:   . Lack of Transportation (Medical): Not on file  . Lack of Transportation (Non-Medical): Not on file  Physical Activity:   . Days of Exercise per Week: Not on file  . Minutes of Exercise per Session: Not on file  Stress:   . Feeling of Stress : Not on file  Social Connections:   . Frequency of Communication with Friends and Family: Not on file  . Frequency of Social Gatherings with Friends and Family: Not on file  . Attends Religious Services: Not on file  . Active Member of Clubs or Organizations: Not on file  . Attends Archivist Meetings: Not on file  . Marital Status: Not on file  Intimate Partner Violence:   . Fear of Current or Ex-Partner: Not on file  . Emotionally Abused: Not on file  . Physically Abused: Not on file  . Sexually Abused: Not  on file    Past Medical History, Surgical history, Social history, and Family history were reviewed and updated as appropriate.   Please see review of systems for further details on the patient's review from today.   Objective:   Physical Exam:  There were no vitals taken for this visit.  Physical Exam Constitutional:      General: She is not in acute distress.    Appearance: She is well-developed.  Musculoskeletal:        General: No deformity.  Neurological:     Mental Status: She is alert and oriented to person, place, and time.     Coordination: Coordination normal.  Psychiatric:        Attention and Perception: Attention and perception normal. She does not perceive auditory or visual hallucinations.        Mood and Affect: Mood is anxious and depressed. Affect is tearful. Affect is not labile, blunt, angry or inappropriate.        Speech: Speech is tangential.        Behavior: Behavior is withdrawn.        Thought Content: Thought content normal. Thought content is not paranoid or delusional. Thought content does not include homicidal or suicidal ideation. Thought content does not include homicidal or suicidal plan.        Cognition and Memory: Cognition and memory normal.        Judgment: Judgment normal.     Comments: Insight intact     Lab Review:     Component Value Date/Time   NA 137 09/28/2017 1847   NA 140 03/27/2017 1543   K 4.6 09/28/2017 1847   CL 100 09/28/2017 1847   CO2 28 09/28/2017 1847   GLUCOSE 101 (H) 09/28/2017 1847   BUN 14 09/28/2017 1847   BUN 6 03/27/2017 1543   CREATININE 0.95 09/28/2017 1847   CALCIUM 9.6  09/28/2017 1847   PROT 7.1 09/28/2017 1847   PROT 7.2 03/27/2017 1543   ALBUMIN 4.5 03/27/2017 1543   AST 13 09/28/2017 1847   ALT 12 09/28/2017 1847   ALKPHOS 101 03/27/2017 1543   BILITOT 0.4 09/28/2017 1847   BILITOT 0.2 03/27/2017 1543   GFRNONAA 70 09/28/2017 1847   GFRAA 82 09/28/2017 1847       Component Value Date/Time    WBC 7.6 03/27/2017 1543   WBC 9.0 06/12/2016 1526   RBC 4.46 03/27/2017 1543   RBC 4.46 06/12/2016 1526   HGB 12.5 03/27/2017 1543   HCT 39.0 03/27/2017 1543   PLT 248 03/27/2017 1543   MCV 87 03/27/2017 1543   MCH 28.0 03/27/2017 1543   MCH 28.5 06/12/2016 1526   MCHC 32.1 03/27/2017 1543   MCHC 32.7 06/12/2016 1526   RDW 13.6 03/27/2017 1543   LYMPHSABS 1.7 03/27/2017 1543   MONOABS 450 06/12/2016 1526   EOSABS 0.1 03/27/2017 1543   BASOSABS 0.0 03/27/2017 1543    No results found for: POCLITH, LITHIUM   No results found for: PHENYTOIN, PHENOBARB, VALPROATE, CBMZ   .res Assessment: Plan:    Continue: 1. Cymbalta 120mg  daily 2. Trazadone 200mg  at hs - d/c 3. Valium 5mg  BID prn  4. Prazosin 1mg  to 2mg  at hs 5. Add Seroquel 50mg  at hs.   Continue therapy with [name]  RTC 4 weeks  Patient advised to contact office with any questions, adverse effects, or acute worsening in signs and symptoms.  Discussed potential benefits, risk, and side effects of benzodiazepines to include potential risk of tolerance and dependence, as well as possible drowsiness.  Advised patient not to drive if experiencing drowsiness and to take lowest possible effective dose to minimize risk of dependence and tolerance.   There are no diagnoses linked to this encounter.   Please see After Visit Summary for patient specific instructions.  Future Appointments  Date Time Provider [place]  10/28/2019  1:20 PM Jabaree Mercado, Berdie Ogren, NP CP-CP None  10/28/2019  2:00 PM [name], PhD CP-CP None  11/04/2019  2:00 PM [name], PhD CP-CP None  11/11/2019  2:00 PM [name], PhD CP-CP None  11/18/2019  2:00 PM [name], PhD CP-CP None  11/25/2019  2:00 PM [name], PhD CP-CP None  12/02/2019  3:00 PM [name], PhD CP-CP None  12/09/2019  2:00 PM Mitchum, Herbie Baltimore, PhD CP-CP None    No orders of the defined types were placed in this  encounter.   -------------------------------

## 2019-11-03 ENCOUNTER — Telehealth: Payer: Self-pay | Admitting: Physician Assistant

## 2019-11-03 ENCOUNTER — Other Ambulatory Visit: Payer: Self-pay

## 2019-11-03 DIAGNOSIS — R110 Nausea: Secondary | ICD-10-CM

## 2019-11-03 DIAGNOSIS — R1013 Epigastric pain: Secondary | ICD-10-CM

## 2019-11-03 NOTE — Telephone Encounter (Signed)
Pt reported that she has had chronic diarrhea and loss of appetite.  She stated that she does not have a car and no health insurance.  Please advise.

## 2019-11-03 NOTE — Telephone Encounter (Signed)
Please schedule office appt to further evaluate Start with lab visit for GI pathogen panel, CBC, CMP, tTG, IgA, TSH

## 2019-11-03 NOTE — Telephone Encounter (Signed)
Order in [place] for blood and stool labs (per Dr. Fuller Plan) asked patient to come in for these and then Dr. Fuller Plan would like her to come in for an office visit. She said she isn't sure when she can come in for the labs. She will call me back when she has had the labs done, and then will schedule an office visit

## 2019-11-03 NOTE — Telephone Encounter (Signed)
Called patient back, and she states she has had diarrhea intermittently for 2 weeks. It is yellow and mostly liquid. No blood. When she eats anything, it goes through her within a few minutes. She tried some anti-diarrhea OTC med on 10/30/19 and it helped a little, but as soon as she stopped it the diarrhea return. She feels she has a GI bug. Please advise

## 2019-11-04 ENCOUNTER — Other Ambulatory Visit: Payer: Self-pay

## 2019-11-04 ENCOUNTER — Ambulatory Visit: Payer: Self-pay | Admitting: Psychiatry

## 2019-11-04 ENCOUNTER — Other Ambulatory Visit (INDEPENDENT_AMBULATORY_CARE_PROVIDER_SITE_OTHER): Payer: Medicaid Other

## 2019-11-04 DIAGNOSIS — R110 Nausea: Secondary | ICD-10-CM

## 2019-11-04 DIAGNOSIS — R1013 Epigastric pain: Secondary | ICD-10-CM

## 2019-11-04 LAB — COMPREHENSIVE METABOLIC PANEL
ALT: 15 U/L (ref 0–35)
AST: 18 U/L (ref 0–37)
Albumin: 4.3 g/dL (ref 3.5–5.2)
Alkaline Phosphatase: 85 U/L (ref 39–117)
BUN: 6 mg/dL (ref 6–23)
CO2: 24 mEq/L (ref 19–32)
Calcium: 9.4 mg/dL (ref 8.4–10.5)
Chloride: 101 mEq/L (ref 96–112)
Creatinine, Ser: 0.82 mg/dL (ref 0.40–1.20)
GFR: 73.31 mL/min (ref >60.00)
Glucose, Bld: 171 mg/dL — ABNORMAL HIGH (ref 70–99)
Potassium: 3.9 mEq/L (ref 3.5–5.1)
Sodium: 135 mEq/L (ref 135–145)
Total Bilirubin: 0.4 mg/dL (ref 0.2–1.2)
Total Protein: 7 g/dL (ref 6.0–8.3)

## 2019-11-04 LAB — CBC WITH DIFFERENTIAL/PLATELET
Basophils Absolute: 0.1 10*3/uL (ref 0.0–0.1)
Basophils Relative: 1 % (ref 0.0–3.0)
Eosinophils Absolute: 0.1 10*3/uL (ref 0.0–0.7)
Eosinophils Relative: 1.2 % (ref 0.0–5.0)
HCT: 36.7 % (ref 36.0–46.0)
Hemoglobin: 12.1 g/dL (ref 12.0–15.0)
Lymphocytes Relative: 22.1 % (ref 12.0–46.0)
Lymphs Abs: 1.6 10*3/uL (ref 0.7–4.0)
MCHC: 33 g/dL (ref 30.0–36.0)
MCV: 83.2 fl (ref 78.0–100.0)
Monocytes Absolute: 0.4 10*3/uL (ref 0.1–1.0)
Monocytes Relative: 5 % (ref 3.0–12.0)
Neutro Abs: 5.3 10*3/uL (ref 1.4–7.7)
Neutrophils Relative %: 70.7 % (ref 43.0–77.0)
Platelets: 196 10*3/uL (ref 150.0–400.0)
RBC: 4.42 Mil/uL (ref 3.87–5.11)
RDW: 13.9 % (ref 11.5–15.5)
WBC: 7.4 10*3/uL (ref 4.0–10.5)

## 2019-11-04 LAB — TSH: TSH: 0.75 u[IU]/mL (ref 0.35–4.50)

## 2019-11-04 LAB — IGA: IgA: 190 mg/dL (ref 68–378)

## 2019-11-04 MED ORDER — ONDANSETRON HCL 4 MG PO TABS: 4.0000 mg | ORAL_TABLET | Freq: Four times a day (QID) | ORAL | 1 refills | Status: DC | PRN

## 2019-11-05 LAB — TTG/DGP SCREEN: tTG/DGP Screen: NEGATIVE

## 2019-11-11 ENCOUNTER — Ambulatory Visit: Payer: Self-pay | Admitting: Psychiatry

## 2019-11-11 ENCOUNTER — Telehealth (INDEPENDENT_AMBULATORY_CARE_PROVIDER_SITE_OTHER): Payer: Self-pay | Admitting: Psychiatry

## 2019-11-11 DIAGNOSIS — F4310 Post-traumatic stress disorder, unspecified: Secondary | ICD-10-CM
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
## 2019-11-17 ENCOUNTER — Other Ambulatory Visit: Payer: Self-pay | Admitting: Nurse Practitioner

## 2019-11-18 ENCOUNTER — Ambulatory Visit (INDEPENDENT_AMBULATORY_CARE_PROVIDER_SITE_OTHER): Payer: Self-pay | Admitting: Psychiatry

## 2019-11-18 ENCOUNTER — Other Ambulatory Visit: Payer: Self-pay

## 2019-11-18 DIAGNOSIS — F5104 Psychophysiologic insomnia: Secondary | ICD-10-CM

## 2019-11-18 DIAGNOSIS — R251 Tremor, unspecified: Secondary | ICD-10-CM

## 2019-11-18 DIAGNOSIS — F331 Major depressive disorder, recurrent, moderate: Secondary | ICD-10-CM

## 2019-11-18 DIAGNOSIS — F4001 Agoraphobia with panic disorder: Secondary | ICD-10-CM

## 2019-11-18 DIAGNOSIS — R296 Repeated falls: Secondary | ICD-10-CM

## 2019-11-18 DIAGNOSIS — F4310 Post-traumatic stress disorder, unspecified: Secondary | ICD-10-CM
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
## 2019-11-21 ENCOUNTER — Other Ambulatory Visit: Payer: Self-pay | Admitting: Adult Health

## 2019-11-21 DIAGNOSIS — G4700 Insomnia, unspecified: Secondary | ICD-10-CM

## 2019-11-21 NOTE — Telephone Encounter (Signed)
Just started last apt, next apt 01/12

## 2019-11-25 ENCOUNTER — Encounter: Payer: Self-pay | Admitting: Adult Health

## 2019-11-25 ENCOUNTER — Ambulatory Visit (INDEPENDENT_AMBULATORY_CARE_PROVIDER_SITE_OTHER): Payer: Medicaid Other | Admitting: Adult Health

## 2019-11-25 ENCOUNTER — Ambulatory Visit (INDEPENDENT_AMBULATORY_CARE_PROVIDER_SITE_OTHER): Payer: Medicaid Other | Admitting: Psychiatry

## 2019-11-25 DIAGNOSIS — F4001 Agoraphobia with panic disorder: Secondary | ICD-10-CM

## 2019-11-25 DIAGNOSIS — F4310 Post-traumatic stress disorder, unspecified: Secondary | ICD-10-CM

## 2019-11-25 DIAGNOSIS — R69 Illness, unspecified: Secondary | ICD-10-CM

## 2019-11-25 DIAGNOSIS — F411 Generalized anxiety disorder: Secondary | ICD-10-CM

## 2019-11-25 DIAGNOSIS — G4700 Insomnia, unspecified: Secondary | ICD-10-CM

## 2019-11-25 DIAGNOSIS — F331 Major depressive disorder, recurrent, moderate: Secondary | ICD-10-CM

## 2019-11-25 DIAGNOSIS — F5104 Psychophysiologic insomnia: Secondary | ICD-10-CM

## 2019-11-25 DIAGNOSIS — F909 Attention-deficit hyperactivity disorder, unspecified type: Secondary | ICD-10-CM

## 2019-11-25 DIAGNOSIS — R296 Repeated falls: Secondary | ICD-10-CM

## 2019-11-25 DIAGNOSIS — F819 Developmental disorder of scholastic skills, unspecified: Secondary | ICD-10-CM

## 2019-11-25 DIAGNOSIS — R251 Tremor, unspecified: Secondary | ICD-10-CM

## 2019-11-25 NOTE — Progress Notes (Signed)
Psychotherapy Progress Note [facility] [name], PhD LP  Patient ID: Deanna George     MRN: [id number]     Therapy format: Individual psychotherapy Date: 11/25/2019      Start: 2:14p     Stop: 3:04p     Time Spent: 50 min Location: Telehealth visit -- I connected with this patient by an approved telecommunication method (audio only), with her informed consent, and verifying identity and patient privacy.  I was located at my office and patient at her home.  As needed, we discussed the limitations, risks, and security and privacy concerns associated with telehealth service, including the availability and conditions which currently govern in-person appointments and the possibility that 3rd-party payment may not be fully guaranteed and she may be responsible for charges.  After she indicated understanding, we proceeded with the session.  Also discussed treatment planning, as needed, including ongoing verbal agreement with the plan, the opportunity to ask and answer all questions, her demonstrated understanding of instructions, and her readiness to call the office should symptoms worsen or she feels she is in a crisis state and needs more immediate and tangible assistance.   Session narrative (presenting needs, interim history, self-report of stressors and symptoms, applications of prior therapy, status changes, and interventions made in session) Converted to telehealth b/c car is still out of town with daughter and she is not supposed to be driving yet.  Corrected the office schedule when she called in an hour ago, but says she explicitly said so when last checked out.  Encouraged to verify with staff anytime specifying telehealth.  Coping OK without car.  Handling home repair issues with water heater, hot water out since Thursday.  Sternal pain continues, gradually less.  Neuropathic pain and arm weakness continue.    Spoke earlier with Ms. Dwaine Gale, Seroquel helpful, just didn't  start until after Xmas.  Less fear of having catastrophe happen overnight.  Will raise it judiciously, hopefully.    Son [name] seems to still be distant, avoidant of her issues, symptoms, and makes her do all the initiating.  He was present on 12/24 when she fell, participated in the emergency actions, abided Christmas, left to go back to his father's.  Examined PT's expectations and understandings of [name], working to dislodge catastrophizing about things her ex-husband has said and done and reading into Hunter's actions that seem distant.  6-7 months ago [name] did in fact tell her he was "done" with their relationship.  Preoccupied with message ([name], maybe parroted by [name]) that he doesn't have relationships because of her.   Challenged to notice how, even if damning things get said, how son's behavior may indicate still connected to her, and favorable.  Advised make sure she is not expecting unrealistic thoughtfulness from son, try to limit how much she portrays distress and neediness, but be ready to ask matter-of-factly for what she needs.  Therapeutic modalities: Cognitive Behavioral Therapy, Solution-Oriented/Positive Psychology and Ego-Supportive  Mental Status/Observations:  Appearance:   Not assessed     Behavior:  Appropriate  Motor:  Not assessed  Speech/Language:   Clear and Coherent  Affect:  Not assessed  Mood:  anxious and depressed  Thought process:  normal  Thought content:    Rumination  Sensory/Perceptual disturbances:    WNL  Orientation:  Fully oriented  Attention:  Fair  Concentration:  Fair  Memory:  grossly intact  Insight:    Fair  Judgment:   Fair  Impulse Control:  Fair   Risk Assessment: Danger to Self: No Self-injurious Behavior: No Danger to Others: No Physical Aggression / Violence: No Duty to Warn: No Access to Firearms a concern: No  Assessment of progress:  progressing  Diagnosis:   ICD-10-CM   1. PTSD (post-traumatic stress disorder)   F43.10   2. Panic disorder with agoraphobia and moderate panic attacks  F40.01   3. Psychophysiological insomnia  F51.04   4. Major depressive disorder, recurrent episode, moderate (HCC)  F33.1   5. Generalized anxiety disorder  F41.1   6. Attention deficit hyperactivity disorder (ADHD), unspecified ADHD type  F90.9   7. Learning disabilities  F81.9   8. Physiological tremor  R25.1   9. Unexplained falls  R29.6   10. r/o cervical nerve impingement AND mitochondiral disease  R69    Plan:  . Keep faith by watching son's behavior more than his words, take critiques with grain of salt . Other recommendations/advice as noted above . Continue to utilize previously learned skills ad lib . Maintain medication as prescribed and work faithfully with relevant prescriber(s) if any changes are desired or seem indicated . Call the clinic on-call service, present to ER, or call 911 if any life-threatening psychiatric crisis Return for time as available. Next scheduled in this office 12/02/2019  [name], PhD [name], PhD LP Clinical Psychologist, [place] [facility] [address], [place], [place] [address] [phone]

## 2019-11-25 NOTE — Progress Notes (Signed)
Deanna George:[id number] 06/19/1966 54 y.o.  Virtual Visit via Telephone Note  I connected with pt on 11/25/19 at  1:20 PM EST by telephone and verified that I am speaking with the correct person using two identifiers.   I discussed the limitations, risks, security and privacy concerns of performing an evaluation and management service by telephone and the availability of in person appointments. I also discussed with the patient that there may be a patient responsible charge related to this service. The patient expressed understanding and agreed to proceed.   I discussed the assessment and treatment plan with the patient. The patient was provided an opportunity to ask questions and all were answered. The patient agreed with the plan and demonstrated an understanding of the instructions.   The patient was advised to call back or seek an in-person evaluation if the symptoms worsen or if the condition fails to improve as anticipated.  I provided 30 minutes of non-face-to-face time during this encounter.  The patient was located at home.  The provider was located at [place].   Aloha Gell, NP   Subjective:   Patient ID:  Deanna George is a 54 y.o. (DOB 24-Nov-1965) female.  Chief Complaint:  Chief Complaint  Patient presents with  . Depression  . Anxiety  . Insomnia  . Trauma  . ADHD  . Other    panic disorder    HPI RHAPSODY BENKER presents for follow-up of MDD, PTSD, GAD, panic disorder, insomnia  Describes mood today as "so-so". Pleasant. Mood symptoms - reports depression, anxiety, and irritability. Daily panic attacks. Stating "I am resting better with the Seroquel". Approved for disability. Concerned about being able to take care of herself financially - "running out of money". Approved for medicaid. Has started the Seroquel - 3 days after Christmas - stating "it is better for sure". Once able to get to sleep, can sleep. Reports decreased "bad dreams". Stating  "it's nice to be able to get some sleep". Recent trip to ED after having a fall from a "black out". Is at home recovering. Has seen PCP for follow-up - was given Tramadol for pain. Unable to drive with injuries and vertigo. Continues to work with Dr. Rica Mote. Varying interest and motivation. Taking medications as prescribed.  Energy levels "low". Active, does not have a regular exercise routine. Disabled. Enjoys some usual interests and activities. Single. Lives alone with her cat and 2 parakeets. Watching TV. Talking to family members - children.  Appetite adequate. Weight gain.  Sleep has improved. Averages 5 to 6 hours with addition of Seroquel.  Focus and concentration difficulties - diagnosed with ADHD when 54 years old. Completing some tasks. Managing some aspects of household. Denies SI or HI. Denies AH or VH.  Previous medications: Ritalin and Thorazine - others, Trazadone  Review of Systems:  Review of Systems  Musculoskeletal: Negative for gait problem.  Neurological: Negative for tremors.  Psychiatric/Behavioral:       Please refer to HPI    Medications: I have reviewed the patient's current medications.  Current Outpatient Medications  Medication Sig Dispense Refill  . albuterol (PROAIR HFA) 108 (90 Base) MCG/ACT inhaler 2 puffs as needed    . calcium carbonate (TUMS EX) 750 MG chewable tablet Chew 2 tablets by mouth 3 (three) times daily.    . Cholecalciferol (VITAMIN D3) 50 MCG (2000 UT) capsule 1 capsule    . cyclobenzaprine (FLEXERIL) 10 MG tablet TAKE 1 TABLET BY MOUTH EVERY 8  HOURS AS NEEDED FOR 30 DAYS    . diazepam (VALIUM) 5 MG tablet Take 1 tablet (5 mg total) by mouth 2 (two) times daily as needed. for anxiety 60 tablet 2  . diphenhydrAMINE (BENADRYL) 25 mg capsule Take 25 mg by mouth every 6 (six) hours as needed for allergies.    . DULoxetine (CYMBALTA) 30 MG capsule Take 120 mg by mouth daily.     . DULoxetine (CYMBALTA) 60 MG capsule Take 90 mg by mouth daily.     Marland Kitchen HYDROcodone-acetaminophen (NORCO/VICODIN) 5-325 MG tablet Take 1 tablet by mouth every 6 (six) hours as needed for moderate pain. (Patient not taking: Reported on 03/06/2019) 12 tablet 0  . ibuprofen (ADVIL) 800 MG tablet 1 tablet with food or milk as needed    . ketorolac (TORADOL) 10 MG tablet Take 10 mg by mouth every 6 (six) hours as needed for moderate pain.    Marland Kitchen losartan (COZAAR) 50 MG tablet TAKE 1 TABLET BY MOUTH TWICE A DAY 180 tablet 1  . losartan (COZAAR) 50 MG tablet 1 tablet    . losartan (COZAAR) 50 MG tablet Take 1 tablet by mouth twice daily 30 tablet 0  . Meclizine HCl (BONINE) 25 MG CHEW Chew by mouth 2 (two) times daily as needed.    . Melatonin 10 MG CAPS Take 10 mg by mouth at bedtime.    . metoprolol tartrate (LOPRESSOR) 25 MG tablet Take 1 tablet by mouth twice daily 30 tablet 0  . metoprolol tartrate (LOPRESSOR) 50 MG tablet Take 1 tablet (50 mg total) by mouth 2 (two) times daily. 180 tablet 3  . nortriptyline (PAMELOR) 25 MG capsule Take 2 capsules (50 mg total) by mouth at bedtime. 60 capsule 6  . omeprazole (PRILOSEC OTC) 20 MG tablet 1 tablet    . omeprazole (PRILOSEC) 40 MG capsule Take 1 capsule by mouth once daily 30 capsule 0  . ondansetron (ZOFRAN) 4 MG tablet Take 1 tablet (4 mg total) by mouth every 6 (six) hours as needed for nausea or vomiting. 30 tablet 1  . ondansetron (ZOFRAN-ODT) 4 MG disintegrating tablet 1 tablet on the tongue and allow to dissolve as needed    . prazosin (MINIPRESS) 2 MG capsule Take 1 capsule (2 mg total) by mouth at bedtime. 30 capsule 2  . Probiotic Product (PROBIOTIC-10 PO) Take by mouth daily.    . promethazine (PHENERGAN) 25 MG tablet Take 1 tablet (25 mg total) by mouth every 8 (eight) hours as needed for nausea or vomiting. 30 tablet 3  . QUEtiapine (SEROQUEL) 50 MG tablet Take 1 tablet (50 mg total) by mouth at bedtime. 30 tablet 2  . rizatriptan (MAXALT-MLT) 10 MG disintegrating tablet Take 1 tablet (10 mg total) by mouth  as needed. May repeat in 2 hours if needed 15 tablet 4  . SUMAtriptan (IMITREX) 6 MG/0.5ML SOLN injection Inject 0.5 mLs (6 mg total) into the skin as needed for migraine. May repeat in 2 hours if headache persists or recurs. 10 vial 4  . traMADol (ULTRAM) 50 MG tablet TAKE ONE TABLET BY MOUTH AS NEEDED FOR HEADACHE 30 tablet 0  . traZODone (DESYREL) 100 MG tablet Take two tablets at bedtime. 60 tablet 2   No current facility-administered medications for this visit.    Medication Side Effects: None  Allergies: No Known Allergies  Past Medical History:  Diagnosis Date  . Anal fissure   . Cervical disc disorder   . Clostridium difficile infection   .  Dysrhythmia    tachycardia  . Esophageal stricture   . GERD (gastroesophageal reflux disease)   . Headache   . Hemorrhoids   . Hypertension   . IBS (irritable bowel syndrome)   . Interstitial cystitis   . Noncompliance    pt denies  . OA (osteoarthritis) of knee   . Palpitations   . Pneumonia   . 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 Situational stress   . Urinary tract infection     Family History  Adopted: Yes  Problem Relation Age of Onset  . Colon cancer Neg Hx   . Pancreatic cancer Neg Hx   . Stomach cancer Neg Hx   . Rectal cancer Neg Hx     Social History   Socioeconomic History  . Marital status: Legally Separated    Spouse name: Not on file  . Number of children: 2  . Years of education: Not on file  . Highest education level: Not on file  Occupational History  . Occupation: student    Comment: graduated 2018  Tobacco Use  . Smoking status: Current Every Day Smoker  . Smokeless tobacco: Never Used  Substance and Sexual Activity  . Alcohol use: Not Currently    Comment: Rare  . Drug use: Not Currently  . Sexual activity: Never  Other Topics Concern  . Not on file  Social History Narrative  . Not on file   Social Determinants of Health   Financial Resource Strain:   . Difficulty of  Paying Living Expenses: Not on file  Food Insecurity:   . Worried About Charity fundraiser in the Last Year: Not on file  . Ran Out of Food in the Last Year: Not on file  Transportation Needs:   . Lack of Transportation (Medical): Not on file  . Lack of Transportation (Non-Medical): Not on file  Physical Activity:   . Days of Exercise per Week: Not on file  . Minutes of Exercise per Session: Not on file  Stress:   . Feeling of Stress : Not on file  Social Connections:   . Frequency of Communication with Friends and Family: Not on file  . Frequency of Social Gatherings with Friends and Family: Not on file  . Attends Religious Services: Not on file  . Active Member of Clubs or Organizations: Not on file  . Attends Archivist Meetings: Not on file  . Marital Status: Not on file  Intimate Partner Violence:   . Fear of Current or Ex-Partner: Not on file  . Emotionally Abused: Not on file  . Physically Abused: Not on file  . Sexually Abused: Not on file    Past Medical History, Surgical history, Social history, and Family history were reviewed and updated as appropriate.   Please see review of systems for further details on the patient's review from today.   Objective:   Physical Exam:  There were no vitals taken for this visit.  Physical Exam Neurological:     Mental Status: She is alert and oriented to person, place, and time.     Cranial Nerves: No dysarthria.  Psychiatric:        Attention and Perception: Attention and perception normal.        Mood and Affect: Mood normal.        Speech: Speech normal.        Behavior: Behavior is cooperative.        Thought Content: Thought content  normal. Thought content is not paranoid or delusional. Thought content does not include homicidal or suicidal ideation. Thought content does not include homicidal or suicidal plan.        Cognition and Memory: Cognition and memory normal.        Judgment: Judgment normal.      Comments: Insight intact     Lab Review:     Component Value Date/Time   NA 135 11/04/2019 1557   NA 140 03/27/2017 1543   K 3.9 11/04/2019 1557   CL 101 11/04/2019 1557   CO2 24 11/04/2019 1557   GLUCOSE 171 (H) 11/04/2019 1557   BUN 6 11/04/2019 1557   BUN 6 03/27/2017 1543   CREATININE 0.82 11/04/2019 1557   CREATININE 0.95 09/28/2017 1847   CALCIUM 9.4 11/04/2019 1557   PROT 7.0 11/04/2019 1557   PROT 7.2 03/27/2017 1543   ALBUMIN 4.3 11/04/2019 1557   ALBUMIN 4.5 03/27/2017 1543   AST 18 11/04/2019 1557   ALT 15 11/04/2019 1557   ALKPHOS 85 11/04/2019 1557   BILITOT 0.4 11/04/2019 1557   BILITOT 0.2 03/27/2017 1543   GFRNONAA 70 09/28/2017 1847   GFRAA 82 09/28/2017 1847       Component Value Date/Time   WBC 7.4 11/04/2019 1557   RBC 4.42 11/04/2019 1557   HGB 12.1 11/04/2019 1557   HGB 12.5 03/27/2017 1543   HCT 36.7 11/04/2019 1557   HCT 39.0 03/27/2017 1543   PLT 196.0 11/04/2019 1557   PLT 248 03/27/2017 1543   MCV 83.2 11/04/2019 1557   MCV 87 03/27/2017 1543   MCH 28.0 03/27/2017 1543   MCH 28.5 06/12/2016 1526   MCHC 33.0 11/04/2019 1557   RDW 13.9 11/04/2019 1557   RDW 13.6 03/27/2017 1543   LYMPHSABS 1.6 11/04/2019 1557   LYMPHSABS 1.7 03/27/2017 1543   MONOABS 0.4 11/04/2019 1557   EOSABS 0.1 11/04/2019 1557   EOSABS 0.1 03/27/2017 1543   BASOSABS 0.1 11/04/2019 1557   BASOSABS 0.0 03/27/2017 1543    No results found for: POCLITH, LITHIUM   No results found for: PHENYTOIN, PHENOBARB, VALPROATE, CBMZ   .res Assessment: Plan:    Continue: Cymbalta 120mg  daily Valium 5mg  BID prn  Prazosin 1mg  to 2mg  at hs Increase Seroquel 50mg  to 100mg  at hs.   Continue therapy with [name]  RTC 4 weeks  Patient advised to contact office with any questions, adverse effects, or acute worsening in signs and symptoms.  Discussed potential benefits, risk, and side effects of benzodiazepines to include potential risk of tolerance and dependence,  as well as possible drowsiness.  Advised patient not to drive if experiencing drowsiness and to take lowest possible effective dose to minimize risk of dependence and tolerance.   Deanna George was seen today for depression, anxiety, insomnia, trauma, adhd and other.  Diagnoses and all orders for this visit:  PTSD (post-traumatic stress disorder)  Panic disorder with agoraphobia and moderate panic attacks  Major depressive disorder, recurrent episode, moderate (HCC)  Insomnia, unspecified type  Generalized anxiety disorder  Attention deficit hyperactivity disorder (ADHD), unspecified ADHD type    Please see After Visit Summary for patient specific instructions.  Future Appointments  Date Time Provider [place]  11/25/2019  2:00 PM [name], PhD CP-CP None  12/02/2019  3:00 PM [name], PhD CP-CP None  12/09/2019  2:00 PM Mitchum, Herbie Baltimore, PhD CP-CP None    No orders of the defined types were placed in this encounter.     -------------------------------

## 2019-12-01 ENCOUNTER — Telehealth: Payer: Self-pay | Admitting: Psychiatry

## 2019-12-01 NOTE — Telephone Encounter (Signed)
Pt wanted you to know she had to cancel for tomorrow. Her hot water heater busted and she cannot afford to come this week, and will try next week.

## 2019-12-02 ENCOUNTER — Ambulatory Visit: Payer: Medicaid Other | Admitting: Psychiatry

## 2019-12-09 ENCOUNTER — Other Ambulatory Visit: Payer: Self-pay

## 2019-12-09 ENCOUNTER — Ambulatory Visit (INDEPENDENT_AMBULATORY_CARE_PROVIDER_SITE_OTHER): Payer: Self-pay | Admitting: Psychiatry

## 2019-12-09 DIAGNOSIS — F4310 Post-traumatic stress disorder, unspecified: Secondary | ICD-10-CM

## 2019-12-09 DIAGNOSIS — R251 Tremor, unspecified: Secondary | ICD-10-CM

## 2019-12-09 DIAGNOSIS — F909 Attention-deficit hyperactivity disorder, unspecified type: Secondary | ICD-10-CM

## 2019-12-09 DIAGNOSIS — F332 Major depressive disorder, recurrent severe without psychotic features: Secondary | ICD-10-CM

## 2019-12-09 DIAGNOSIS — F5104 Psychophysiologic insomnia: Secondary | ICD-10-CM

## 2019-12-09 DIAGNOSIS — F4001 Agoraphobia with panic disorder: Secondary | ICD-10-CM

## 2019-12-09 DIAGNOSIS — R296 Repeated falls: Secondary | ICD-10-CM

## 2019-12-09 DIAGNOSIS — R69 Illness, unspecified: Secondary | ICD-10-CM

## 2019-12-09 NOTE — Progress Notes (Signed)
Psychotherapy Progress Note [facility] Deanna Moore, PhD LP  Patient ID: Deanna George     MRN: [id number] Therapy format: Individual psychotherapy Date: 12/09/2019      Start: 2:07p     Stop: 3:08p     Time Spent: 45 min (remainder donated) Location: In-person   Session narrative (presenting needs, interim history, self-report of stressors and symptoms, applications of prior therapy, status changes, and interventions made in session) Sleep seems to be noticeably better as Seroquel has taken hold.  Finances tight while awaiting disability settlement and activation of other benefits.  Trying to work through [place] of signing up with Medicaid.  Informed of likely option to reduce property tax through [employer]. Working with the hospital bill from Christmas, has sound advice from her case Freight forwarder.    Ruminating some on her 52nd move coming up.  House since 2015, after apartments several years during contentious divorce/property settlement with Deanna George.  One house she attached to, in [place], [place], where she lived from 3 mos to 54 yo.  Continues to wrestle with double message from stepmother -- materially supportive over the years, especially through the divorce, but repeatedly forbidding, too.  sM [place], entered at 54yo, after nearly 52 yrs with just father, then had two natural children soon after Deanna's adoption.  Clear in retrospect that Deanna may have had some attachment disorder, then displaced by sM joining, sM was simultaneously immigrant, bipolar, and unrealistic about child development.    Wrestling with whether to hope that she will have a place to put down roots again, whether she needs to stay around for her kids (still connected to Deanna George, but losing hope for Deanna George (who had disturbingly reptilian reactions to her fall at Christmas, very reminiscent of ex-H [name], who hung up on her when she had miscarriage, bitched at her when she had a serious kidney infection  and needed him to call off work).    Discussed experience with [name], overcoming her naivete about his sexuality, dealing with his overcontrolled and dominating superconservative family, gaslight experiences she went through, and waking up to his ways.  Normalized again not being able to detect his lies earlier, his background as strong training in addiction to, and defense of, his image.  Normalized both of them finding each other for pathological but possibly developmental reasons, and doing her best to decathect him now.    Still needs help disputing the life script that she is worthless, rejectable, damaged, and unwelcome.  Largely centers around [place] right now.  Firmly encouraged to think of him not as particularly treacherous but as captive, under the spell of father's money, lifestyle, access to [employer] [place], and beholden some to father's example and rhetoric, but not fundamentally turned against Deanna.  Emphasized that he has not forgotten though he may have buried, her kindness and steadfastness as a mother, but he is under the influence right now.  Likened to [employer] from [employer], who still had good in him that came forth late in the story, after he saw through the power of the Dark Side.  Analogy resonates with her.  Therapeutic modalities: Cognitive Behavioral Therapy, Solution-Oriented/Positive Psychology and Ego-Supportive  Mental Status/Observations:  Appearance:   Casual     Behavior:  Appropriate  Motor:  Normal and no unsteadiness today  Speech/Language:   Clear and Coherent  Affect:  Appropriate  Mood:  depressed and dysthymic  Thought process:  normal  Thought content:  Rumination  Sensory/Perceptual disturbances:    WNL  Orientation:  Fully oriented  Attention:  Good  Concentration:  Good  Memory:  WNL  Insight:    Fair  Judgment:   Good  Impulse Control:  Good   Risk Assessment: Danger to Self: No Self-injurious Behavior: No Danger to Others: No Physical  Aggression / Violence: No Duty to Warn: No Access to Firearms a concern: No  Assessment of progress:  progressing  Diagnosis:   ICD-10-CM   1. PTSD (post-traumatic stress disorder)  F43.10   2. Panic disorder with agoraphobia and moderate panic attacks  F40.01   3. Severe episode of recurrent major depressive disorder, without psychotic features ([place])  F33.2   4. Psychophysiological insomnia  F51.04   5. Attention deficit hyperactivity disorder (ADHD), unspecified ADHD type  F90.9   6. Physiological tremor  R25.1   7. Unexplained falls  R29.6   8. r/o cervical nerve impingement AND mitochondiral disease  R69    Plan:  . Follow through steps signing up for benefits.  Check with [facility] tax dept about lowering her bill due to disability . Dispute attributions of being unwanted, rejectable, damaged, bad -- just because people failed her does not mean she is a failure . Resist catastrophizing about son turning dark . Consider options for affording decent living, possible there is a program that helps with medical disabilities a la Section 8, possible there may be mortgage relief that will allow her to stay put . Other recommendations/advice as may be noted above . Continue to utilize previously learned skills ad lib . Maintain medication as prescribed and work faithfully with relevant prescriber(s) if any changes are desired or seem indicated . Call the clinic on-call service, present to ER, or call 911 if any life-threatening psychiatric crisis . Return for needs ROI done -- case worker at [facility].Marland Kitchen  Next scheduled visit in this office Visit date not found.  [name], PhD Deanna Moore, PhD LP Clinical Psychologist, [facility] [address], [place], [place] [address] [phone]

## 2019-12-18 ENCOUNTER — Other Ambulatory Visit: Payer: Self-pay | Admitting: Gastroenterology

## 2019-12-18 ENCOUNTER — Telehealth: Payer: Self-pay | Admitting: Nurse Practitioner

## 2019-12-18 MED ORDER — LOSARTAN POTASSIUM 50 MG PO TABS: 50.0000 mg | ORAL_TABLET | Freq: Two times a day (BID) | ORAL | 0 refills | Status: DC

## 2019-12-18 NOTE — Telephone Encounter (Signed)
Pt's medication was sent to pt's pharmacy as requested. Confirmation received.  °

## 2019-12-18 NOTE — Telephone Encounter (Deleted)
Pt's medication was sent to pt's pharmacy requesting pt to call back to set up overdue appt before anymore refills. Confirmation received.

## 2019-12-18 NOTE — Telephone Encounter (Signed)
*  STAT* If patient is at the pharmacy, call can be transferred to refill team.   1. Which medications need to be refilled? (please list name of each medication and dose if known) losartan (COZAAR) 50 MG tablet  2. Which pharmacy/location (including street and city if local pharmacy) is medication to be sent to? [place], [place]  3. Do they need a 30 day or 90 day supply? 90 day supply

## 2019-12-22 ENCOUNTER — Other Ambulatory Visit: Payer: Self-pay

## 2019-12-22 MED ORDER — DULOXETINE HCL 60 MG PO CPEP: 120.0000 mg | ORAL_CAPSULE | Freq: Every day | ORAL | 1 refills | Status: DC

## 2019-12-24 ENCOUNTER — Telehealth: Payer: Self-pay | Admitting: Physician Assistant

## 2019-12-24 NOTE — Telephone Encounter (Signed)
Refill omeprazole 40 mg po qd for 6 months. If she is not able to do an in person or virtual visit (if we are even able to offer this in 3 months) with [place] at that point she can change to Prilosec 20 mg OTC take 2 po qd or request her PCP to fill this medication for her.

## 2019-12-24 NOTE — Telephone Encounter (Signed)
Pt requested a refill for omeprazole.  She is aware that she needs an OV, but she stated that she often has unexpected fainting, seizures and tremors and is limited on driving.  Pt reported that she had fractured her sternum in December 2020 and possibly has a left-sided stroke after seizure.    She stated that she is currently self pay and cannot afford a visit now.  She requested refill to be sent to pharmacy.

## 2019-12-24 NOTE — Telephone Encounter (Signed)
Dr. Fuller Plan please advise if we can refill and for how long?

## 2019-12-24 NOTE — Telephone Encounter (Signed)
Forwarded to [place], Dr Lynne Leader CMA

## 2019-12-25 MED ORDER — OMEPRAZOLE 40 MG PO CPDR: 40.0000 mg | DELAYED_RELEASE_CAPSULE | Freq: Every day | ORAL | 5 refills | Status: DC

## 2019-12-25 NOTE — Telephone Encounter (Signed)
Left message for patient to return my call.

## 2019-12-25 NOTE — Telephone Encounter (Signed)
Went over office visit appt option with patient. Patient states she will do a virtual visit with Dr. Fuller Plan. Explained to patient that I will call her and remind her the process the day before the appt. Also that we will send the prescription for omeprazole to her pharmacy. Patient verbalized understanding.

## 2020-01-05 ENCOUNTER — Ambulatory Visit: Payer: Medicaid Other | Admitting: Psychiatry

## 2020-01-06 ENCOUNTER — Telehealth: Payer: Self-pay | Admitting: Psychiatry

## 2020-01-06 NOTE — Telephone Encounter (Signed)
Not feeling well over past week. Not sleeping well and hasn't for several days. When this happens, she often has a 'pass out cold sleep' response. That happened yesterday and she missed her appt. So very sorry.

## 2020-01-07 ENCOUNTER — Telehealth: Payer: Self-pay | Admitting: Physician Assistant

## 2020-01-07 NOTE — Telephone Encounter (Signed)
Called patient back, and she states she is still having diarrhea and abdominal pain, if she eats anything that isn't bland. Moved her office visit up to 01/16/20 with Ellouise Newer PA. Said she will really try to make it.

## 2020-01-08 ENCOUNTER — Other Ambulatory Visit: Payer: Self-pay

## 2020-01-08 ENCOUNTER — Ambulatory Visit (INDEPENDENT_AMBULATORY_CARE_PROVIDER_SITE_OTHER): Payer: Self-pay | Admitting: Psychiatry

## 2020-01-08 DIAGNOSIS — F4001 Agoraphobia with panic disorder: Secondary | ICD-10-CM

## 2020-01-08 DIAGNOSIS — F332 Major depressive disorder, recurrent severe without psychotic features: Secondary | ICD-10-CM

## 2020-01-08 DIAGNOSIS — F4310 Post-traumatic stress disorder, unspecified: Secondary | ICD-10-CM

## 2020-01-08 DIAGNOSIS — F909 Attention-deficit hyperactivity disorder, unspecified type: Secondary | ICD-10-CM

## 2020-01-08 DIAGNOSIS — F5104 Psychophysiologic insomnia: Secondary | ICD-10-CM

## 2020-01-08 DIAGNOSIS — R296 Repeated falls: Secondary | ICD-10-CM

## 2020-01-08 DIAGNOSIS — R251 Tremor, unspecified: Secondary | ICD-10-CM

## 2020-01-08 NOTE — Progress Notes (Signed)
Psychotherapy Progress Note [facility] [name], PhD LP  Patient ID: Deanna George     MRN: [id number] Therapy format: Individual psychotherapy Date: 01/08/2020      Start: 4:18p     Stop: 5:08p     Time Spent: 50 min Location: In-person   Session narrative (presenting needs, interim history, self-report of stressors and symptoms, applications of prior therapy, status changes, and interventions made in session) Highly apologetic for missed appt Monday -- had involuntary loss of consciousness at home.  Not injured this time.  Reports continuing pain sequelae from Xmas Eve fall, both sternum and right lower back.  IBS and stomach reactivity continue, with sometime inability to hold down some meds sometimes.  Some foods kick off pain and bloating with not obvious explanation.  On her theme of cursed life and "two steps forward", was called to inform that her Medicaid and disability were denied for excess resources -- a $20K account is apparently being counted, when her atty said it shouldn't be.  Does understand she is not denied, nor flagged as a fraud, though she needed to ask the official.  While she can credit that she was treated compassionately, it reinvigorated helpless/hopeless thinking about her situation and her life and plunged her back into despair and anxiety and dread of the prospect of moving out of her house, possibly into a trailer.  Nausea is helping her put 2-3 days between smoking episodes, but indulged when told about her Medicaid decision.  Also reports she is out of both Prazosin and Seroquel right now, having dismissed call from pharmacy while in pain.  Advised to get in direct touch with pharmacy soonest available to resupply and make sure she does not face these things undefended.   Extended interaction about suicidal feelings and ringing sense that she is cursed to be unhappy and wants to quit living and feeling.  Began to assert her children would  be unaffected if she died, claiming son Hunter's inertness and disdain for her and resisting ample evidence from PT's own mouth that her daughter Summer would be highly upset and hurt if she did take her life.  Challenged as Research scientist (medical) to think she knows better how her daughter should feel than her daughter, reminded PT she knows what it feels like to have her own feelings -- particularly trusting, tender ones -- dismissed, and that she would be untrue to her own values if she imposed on her own daughter what she has so work to spare her.  Also faithless were she to cancel the project she has worked through to completion, claiming and winning disability and the opportunity to restabilize her living situation, finances, and health care.  Made clear that she is reacting acutely both to the loss of helpful medication and the cascade of feelings attached to losing her working impression of help on the way, but per her own report, she has a plan how to spend down her account and trigger public assistance when her savings reaches criterion.  Reluctantly agreed the challenge is to cope with hope itself, which in her experience is the most dangerous thing, preceding pain and abandonment over and over in her life.  Reminded she has known better, she is looking at her current setback through the lens of medication shortage and pain, and she has been clearly and compassionately told that her disability assistance turns on when she reduces savings far enough -- allergic to hope or not, it is real, and  too important both to let traumatized feeligns rule her decision-making and to let temporary feelings create a permanent message and bad example to her daughter.  Pledges safety, and aware that she could use savings to pay down her mortgage, which has been in deferral, to make ends enough.  That, and she can make a payment plan with [facility] that will be affordable and presumably still allow for back-billing Medicaid when  activated.  Confirmed therapy schedule and intention to continue.  Therapeutic modalities: Cognitive Behavioral Therapy, Solution-Oriented/Positive Psychology and Motivational Interviewing  Mental Status/Observations:  Appearance:   Casual     Behavior:  Appropriate  Motor:  Normal  Speech/Language:   Clear and Coherent  Affect:  Appropriate  Mood:  anxious and depressed  Thought process:  normal  Thought content:    despairing and depression-defending patterns  Sensory/Perceptual disturbances:    WNL  Orientation:  Fully oriented  Attention:  Good  Concentration:  Good  Memory:  WNL  Insight:    Good  Judgment:   Fair  Impulse Control:  Good   Risk Assessment: Danger to Self: Yes.  without intent/plan Self-injurious Behavior: No Danger to Others: No Physical Aggression / Violence: No Duty to Warn: No Access to Firearms a concern: No  Assessment of progress:  situational setback(s)  Diagnosis:   ICD-10-CM   1. Severe episode of recurrent major depressive disorder, without psychotic features ([place])  F33.2   2. PTSD (post-traumatic stress disorder)  F43.10   3. Panic disorder with agoraphobia and moderate panic attacks  F40.01   4. Psychophysiological insomnia  F51.04   5. Physiological tremor  R25.1   6. Unexplained falls  R29.6   7. Attention deficit hyperactivity disorder (ADHD), unspecified ADHD type  F90.9    Plan:  . Maintain safety and self-affirm reasons to allow further life developments without foreclosing on the value of living . Check pharmacy ASAP for refills . Other recommendations/advice as may be noted above . Continue to utilize previously learned skills ad lib . Maintain medication as prescribed and work faithfully with relevant prescriber(s) if any changes are desired or seem indicated . Call the clinic on-call service, present to ER, or call 911 if any life-threatening psychiatric crisis . Return for session(s) already scheduled.Marland Kitchen  Next scheduled  visit in this office 01/13/2020.  [name], PhD [name], PhD LP Clinical Psychologist, [facility] [address] [place], [place] [address] [phone]

## 2020-01-13 ENCOUNTER — Other Ambulatory Visit: Payer: Self-pay

## 2020-01-13 ENCOUNTER — Encounter: Payer: Self-pay | Admitting: Psychiatry

## 2020-01-13 ENCOUNTER — Ambulatory Visit (INDEPENDENT_AMBULATORY_CARE_PROVIDER_SITE_OTHER): Payer: Self-pay | Admitting: Psychiatry

## 2020-01-13 DIAGNOSIS — F5104 Psychophysiologic insomnia: Secondary | ICD-10-CM

## 2020-01-13 DIAGNOSIS — F909 Attention-deficit hyperactivity disorder, unspecified type: Secondary | ICD-10-CM

## 2020-01-13 DIAGNOSIS — R5382 Chronic fatigue, unspecified: Secondary | ICD-10-CM

## 2020-01-13 DIAGNOSIS — F332 Major depressive disorder, recurrent severe without psychotic features: Secondary | ICD-10-CM

## 2020-01-13 DIAGNOSIS — M797 Fibromyalgia: Secondary | ICD-10-CM

## 2020-01-13 DIAGNOSIS — F17210 Nicotine dependence, cigarettes, uncomplicated: Secondary | ICD-10-CM

## 2020-01-13 DIAGNOSIS — F4310 Post-traumatic stress disorder, unspecified: Secondary | ICD-10-CM

## 2020-01-13 NOTE — Progress Notes (Deleted)
Admin note for non-service contact  Patient ID: Deanna George  MRN: [id number] DATE: 01/13/2020  This is only a test note  [name], PhD [name], PhD LP Clinical Psychologist, [facility] [address] [place], [place] [address] [phone]
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
## 2020-01-16 ENCOUNTER — Encounter: Payer: Self-pay | Admitting: Physician Assistant

## 2020-01-16 ENCOUNTER — Ambulatory Visit: Payer: Self-pay | Admitting: Physician Assistant

## 2020-01-16 VITALS — BP 164/100 | HR 108 | Temp 97.3°F | Ht 64.0 in | Wt 216.8 lb

## 2020-01-16 DIAGNOSIS — R1013 Epigastric pain: Secondary | ICD-10-CM

## 2020-01-16 DIAGNOSIS — R194 Change in bowel habit: Secondary | ICD-10-CM

## 2020-01-16 DIAGNOSIS — R112 Nausea with vomiting, unspecified: Secondary | ICD-10-CM

## 2020-01-16 MED ORDER — DICYCLOMINE HCL 10 MG PO CAPS: 10.0000 mg | ORAL_CAPSULE | Freq: Three times a day (TID) | ORAL | 5 refills | Status: DC

## 2020-01-16 MED ORDER — ONDANSETRON HCL 4 MG PO TABS: 4.0000 mg | ORAL_TABLET | Freq: Four times a day (QID) | ORAL | 11 refills | Status: DC | PRN

## 2020-01-16 MED ORDER — PROMETHAZINE HCL 25 MG PO TABS: 25.0000 mg | ORAL_TABLET | Freq: Four times a day (QID) | ORAL | 11 refills | Status: AC | PRN

## 2020-01-16 NOTE — Progress Notes (Signed)
Chief Complaint: Abdominal pain  HPI:    Deanna George is a 54 year old Caucasian female with a past medical history as listed below, known to Dr. Fuller Plan, who was referred to me by [name], MD for a complaint of abdominal pain.      2010 colonoscopy was hyperplastic polyps.    07/14/2011 flex sigmoidoscopy with an anal fissure and internal hemorrhoids.    08/06/2014 EGD with variable Z-line, pathology consistent with reflux.    07/21/2016 EGD and colonoscopy.  EGD showed Z-line was variable, small hiatal hernia and otherwise normal to the second portion of duodenum.  Pathology showed chronic inflammation.  Normal colonoscopy.    10/01/2018 office visit by me for medication refill.  At that time brought with her 5 pages of printed out symptoms which she had been having over the past 3 years.  She described increasing/worsening of most of her neurological symptoms including spasms and occasionally dropping objects as well as passing out.  She had been following with neurology and had been referred to [facility], but apparently they were unable to get her and she is set up with the [facility] clinic.  GI wise she described running out of her omeprazole 40 twice a day and been using 20 mg daily and was having increased amount of epigastric discomfort with heartburn and reflux.  At that visit refilled Zofran and omeprazole.    11/04/2019 labs including CMP and CBC were normal.  Stool studies were also ordered but she never returned these.    12/24/2019 patient called requesting a refill of omeprazole.  At that time refilled omeprazole 40 daily for 6 months.  She was apparently set up for a virtual visit.    01/08/2020 patient did see her psychiatrist for severe episode of recurrent major depressive disorder.    Today, the patient presents to clinic and explains that back in December she started having diarrhea, called our clinic and had labs as above but was unable to produce a stool sample.  Explains that since  then she feels like her GI system is "out of whack".  Describes this all occurred after a "brown out" (she passed out) event where she was taken to the hospital because they thought she may have had a stroke.  Tells me that now she is extremely nauseous and has actually run out of her Zofran and Promethazine.  Due to this can hardly take anything by mouth because "my body just says no".  Explains that she cannot hardly take her pills and eats very little throughout the day.  Feels like her stools are slowly returning to normal though they are a "mustard yellow" color and sometimes they have texture and other times it is just water.  Can go a couple of days with no bowel movement at all.  In fact it feels to her like her food gets to a point and gets stuck and then when it builds up so much it all releases.  Also describes an epigastric pain which started about 20 minutes after eating, sometimes this is rated as a 10/10 and so severe that she has to bend over the back of the couch to help put pressure on this area which seems to help slightly.  This can last for 6 to 8 hours at a time after it starts.  Denies any heartburn or reflux symptoms.    Does tell me that money is a concern for her as she has currently lost her Medicaid but  is trying to get it back.  She would like a "cheap option".    Denies fever, chills, blood in her stool or symptoms that awaken her from sleep.  Past Medical History:  Diagnosis Date  . Anal fissure   . Cervical disc disorder   . Clostridium difficile infection   . Dysrhythmia    tachycardia  . Esophageal stricture   . GERD (gastroesophageal reflux disease)   . Headache   . Hemorrhoids   . Hypertension   . IBS (irritable bowel syndrome)   . Interstitial cystitis   . Noncompliance    pt denies  . OA (osteoarthritis) of knee   . Palpitations   . Pneumonia   . 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 Situational stress   . Urinary tract infection     Past  Surgical History:  Procedure Laterality Date  . BILATERAL OOPHORECTOMY  01/2017   Laparoscopic at [facility] minimally invasive surgery department  . BREAST EXCISIONAL BIOPSY Right 1995  . CESAREAN SECTION  1998,2000   x2  . CHOLECYSTECTOMY N/A 01/28/2013   Procedure: LAPAROSCOPIC CHOLECYSTECTOMY WITH INTRAOPERATIVE CHOLANGIOGRAM;  Surgeon: Earnstine Regal, MD;  Location: [facility];  Service: General;  Laterality: N/A;  . COLONOSCOPY    . Foot pin post fracture  09/2008   Left foot  . KNEE ARTHROSCOPY  2011    right  . LAPAROSCOPIC BILATERAL SALPINGECTOMY Bilateral 02/02/2015   Procedure: BILATERAL SALPINGECTOMY, ;  Surgeon: Anastasio Auerbach, MD;  Location: [facility];  Service: Gynecology;  Laterality: Bilateral;  . Laparoscopic surgery  01/2007   uterus and ovary x 2  . LAPAROSCOPIC VAGINAL HYSTERECTOMY     02/2008  . LAPAROSCOPY N/A 02/02/2015   Procedure: LAPAROSCOPY DIAGNOSTIC, FULGERATION ENDOMETREOSIS, EXCISION RIGHT OVARIAN CYST, LYSIS OF ADHESIONS, EXCISION VULVAR AND VAGINAL CYST ;  Surgeon: Anastasio Auerbach, MD;  Location: [facility];  Service: Gynecology;  Laterality: N/A;  . MOLE REMOVAL Right 2010  . US ECHOCARDIOGRAPHY  07/31/2008   EF 55-60% with mild LVH    Current Outpatient Medications  Medication Sig Dispense Refill  . albuterol (PROAIR HFA) 108 (90 Base) MCG/ACT inhaler 2 puffs as needed    . calcium carbonate (TUMS EX) 750 MG chewable tablet Chew 2 tablets by mouth 3 (three) times daily.    . Cholecalciferol (VITAMIN D3) 50 MCG (2000 UT) capsule 1 capsule    . cyclobenzaprine (FLEXERIL) 10 MG tablet TAKE 1 TABLET BY MOUTH EVERY 8 HOURS AS NEEDED FOR 30 DAYS    . diazepam (VALIUM) 5 MG tablet Take 1 tablet (5 mg total) by mouth 2 (two) times daily as needed. for anxiety 60 tablet 2  . diphenhydrAMINE (BENADRYL) 25 mg capsule Take 25 mg by mouth every 6 (six) hours as needed for allergies.    . DULoxetine (CYMBALTA) 60 MG capsule Take 2 capsules (120 mg total) by mouth daily. 60  capsule 1  . HYDROcodone-acetaminophen (NORCO/VICODIN) 5-325 MG tablet Take 1 tablet by mouth every 6 (six) hours as needed for moderate pain. (Patient not taking: Reported on 03/06/2019) 12 tablet 0  . ibuprofen (ADVIL) 800 MG tablet 1 tablet with food or milk as needed    . ketorolac (TORADOL) 10 MG tablet Take 10 mg by mouth every 6 (six) hours as needed for moderate pain.    Marland Kitchen losartan (COZAAR) 50 MG tablet Take 1 tablet (50 mg total) by mouth 2 (two) times daily. Please make overdue appt with the Doctor before  anymore refills. 2nd attempt 30 tablet 0  . Meclizine HCl (BONINE) 25 MG CHEW Chew by mouth 2 (two) times daily as needed.    . Melatonin 10 MG CAPS Take 10 mg by mouth at bedtime.    . metoprolol tartrate (LOPRESSOR) 25 MG tablet Take 1 tablet by mouth twice daily 30 tablet 0  . metoprolol tartrate (LOPRESSOR) 50 MG tablet Take 1 tablet (50 mg total) by mouth 2 (two) times daily. 180 tablet 3  . nortriptyline (PAMELOR) 25 MG capsule Take 2 capsules (50 mg total) by mouth at bedtime. 60 capsule 6  . omeprazole (PRILOSEC OTC) 20 MG tablet 1 tablet    . omeprazole (PRILOSEC) 40 MG capsule Take 1 capsule (40 mg total) by mouth daily. 30 capsule 5  . ondansetron (ZOFRAN) 4 MG tablet Take 1 tablet (4 mg total) by mouth every 6 (six) hours as needed for nausea or vomiting. 30 tablet 1  . ondansetron (ZOFRAN-ODT) 4 MG disintegrating tablet 1 tablet on the tongue and allow to dissolve as needed    . prazosin (MINIPRESS) 2 MG capsule Take 1 capsule (2 mg total) by mouth at bedtime. 30 capsule 2  . Probiotic Product (PROBIOTIC-10 PO) Take by mouth daily.    . promethazine (PHENERGAN) 25 MG tablet Take 1 tablet (25 mg total) by mouth every 8 (eight) hours as needed for nausea or vomiting. 30 tablet 3  . QUEtiapine (SEROQUEL) 50 MG tablet Take 1 tablet (50 mg total) by mouth at bedtime. 30 tablet 2  . rizatriptan (MAXALT-MLT) 10 MG disintegrating tablet Take 1 tablet (10 mg total) by mouth as needed.  May repeat in 2 hours if needed 15 tablet 4  . SUMAtriptan (IMITREX) 6 MG/0.5ML SOLN injection Inject 0.5 mLs (6 mg total) into the skin as needed for migraine. May repeat in 2 hours if headache persists or recurs. 10 vial 4  . traMADol (ULTRAM) 50 MG tablet TAKE ONE TABLET BY MOUTH AS NEEDED FOR HEADACHE 30 tablet 0  . traZODone (DESYREL) 100 MG tablet Take two tablets at bedtime. 60 tablet 2   No current facility-administered medications for this visit.    Allergies as of 01/16/2020  . (No Known Allergies)    Family History  Adopted: Yes  Problem Relation Age of Onset  . Colon cancer Neg Hx   . Pancreatic cancer Neg Hx   . Stomach cancer Neg Hx   . Rectal cancer Neg Hx     Social History   Socioeconomic History  . Marital status: Legally Separated    Spouse name: Not on file  . Number of children: 2  . Years of education: Not on file  . Highest education level: Not on file  Occupational History  . Occupation: student    Comment: graduated 2018  Tobacco Use  . Smoking status: Current Every Day Smoker  . Smokeless tobacco: Never Used  Substance and Sexual Activity  . Alcohol use: Not Currently    Comment: Rare  . Drug use: Not Currently  . Sexual activity: Never  Other Topics Concern  . Not on file  Social History Narrative  . Not on file   Social Determinants of Health   Financial Resource Strain:   . Difficulty of Paying Living Expenses: Not on file  Food Insecurity:   . Worried About Charity fundraiser in the Last Year: Not on file  . Ran Out of Food in the Last Year: Not on file  Transportation Needs:   .  Lack of Transportation (Medical): Not on file  . Lack of Transportation (Non-Medical): Not on file  Physical Activity:   . Days of Exercise per Week: Not on file  . Minutes of Exercise per Session: Not on file  Stress:   . Feeling of Stress : Not on file  Social Connections:   . Frequency of Communication with Friends and Family: Not on file  .  Frequency of Social Gatherings with Friends and Family: Not on file  . Attends Religious Services: Not on file  . Active Member of Clubs or Organizations: Not on file  . Attends Archivist Meetings: Not on file  . Marital Status: Not on file  Intimate Partner Violence:   . Fear of Current or Ex-Partner: Not on file  . Emotionally Abused: Not on file  . Physically Abused: Not on file  . Sexually Abused: Not on file    Review of Systems:    Constitutional: No weight loss, fever or chills Cardiovascular: No chest pain Respiratory: No SOB Gastrointestinal: See HPI and otherwise negative   Physical Exam:  Vital signs: BP (!) 164/100   Pulse (!) 108   Temp (!) 97.3 F (36.3 C)   Ht 5\' 4"  (1.626 m)   Wt 216 lb 12.8 oz (98.3 kg)   BMI 37.21 kg/m    Constitutional:   Pleasant Caucasian female appears to be in NAD, Well developed, Well nourished, alert and cooperative Respiratory: Respirations even and unlabored. Lungs clear to auscultation bilaterally.   No wheezes, crackles, or rhonchi.  Cardiovascular: Normal S1, S2. No MRG. Regular rate and rhythm. No peripheral edema, cyanosis or pallor.  Gastrointestinal:  Soft, mild distension, mil epigastric ttp. No rebound or guarding. Normal bowel sounds. No appreciable masses or hepatomegaly. Rectal:  Not performed.  Psychiatric: Demonstrates good judgement and reason without abnormal affect or behaviors.  RELEVANT LABS AND IMAGING: CBC    Component Value Date/Time   WBC 7.4 11/04/2019 1557   RBC 4.42 11/04/2019 1557   HGB 12.1 11/04/2019 1557   HGB 12.5 03/27/2017 1543   HCT 36.7 11/04/2019 1557   HCT 39.0 03/27/2017 1543   PLT 196.0 11/04/2019 1557   PLT 248 03/27/2017 1543   MCV 83.2 11/04/2019 1557   MCV 87 03/27/2017 1543   MCH 28.0 03/27/2017 1543   MCH 28.5 06/12/2016 1526   MCHC 33.0 11/04/2019 1557   RDW 13.9 11/04/2019 1557   RDW 13.6 03/27/2017 1543   LYMPHSABS 1.6 11/04/2019 1557   LYMPHSABS 1.7  03/27/2017 1543   MONOABS 0.4 11/04/2019 1557   EOSABS 0.1 11/04/2019 1557   EOSABS 0.1 03/27/2017 1543   BASOSABS 0.1 11/04/2019 1557   BASOSABS 0.0 03/27/2017 1543    CMP     Component Value Date/Time   NA 135 11/04/2019 1557   NA 140 03/27/2017 1543   K 3.9 11/04/2019 1557   CL 101 11/04/2019 1557   CO2 24 11/04/2019 1557   GLUCOSE 171 (H) 11/04/2019 1557   BUN 6 11/04/2019 1557   BUN 6 03/27/2017 1543   CREATININE 0.82 11/04/2019 1557   CREATININE 0.95 09/28/2017 1847   CALCIUM 9.4 11/04/2019 1557   PROT 7.0 11/04/2019 1557   PROT 7.2 03/27/2017 1543   ALBUMIN 4.3 11/04/2019 1557   ALBUMIN 4.5 03/27/2017 1543   AST 18 11/04/2019 1557   ALT 15 11/04/2019 1557   ALKPHOS 85 11/04/2019 1557   BILITOT 0.4 11/04/2019 1557   BILITOT 0.2 03/27/2017 1543   GFRNONAA 70  09/28/2017 1847   GFRAA 82 09/28/2017 1847    Assessment: 1.  Abdominal pain: Mostly epigastric, "feels like something is getting hung up", concern for gastritis versus possible obstruction versus functional dyspepsia 2.  Change in bowel habits: Towards looser stools most of the time, though now it is slightly returning to normal, initially just watery diarrhea 3.  Nausea and vomiting: Increasing over the past 2 months though patient has run out of her antiemetics  Plan: 1.  Refilled patient's Zofran and Promethazine for a year. 2.  We will start the patient on Dicyclomine 10 mg 4 times daily, 20 minutes before meals and at bedtime.  #120 with 3 refills. 3.  Patient did discuss concerns about money.  We discussed possible EGD and colonoscopy in the future +/- imaging including a small bowel follow-through and/or abdominal x-ray but at this time she wants to try the medicine first.  Also does not feel like she could prep for colonoscopy. 4.  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  Encouraged her to try  and take her probiotic when she is able. 5.  Patient will stay in touch with me and let me know how she is doing, if the medicine is not helping at all then we will discuss further imaging as the next step. 6.  Patient to follow in clinic as needed/directed with me in the future.  Ellouise Newer, PA-C [facility] 01/16/2020, 2:10 PM  Cc: [name], MD

## 2020-01-16 NOTE — Patient Instructions (Signed)
If you are age 52 or older, your body mass index should be between 23-30. Your Body mass index is 37.21 kg/m. If this is out of the aforementioned range listed, please consider follow up with your Primary Care Provider.  If you are age 23 or younger, your body mass index should be between 19-25. Your Body mass index is 37.21 kg/m. If this is out of the aformentioned range listed, please consider follow up with your Primary Care Provider.   We have sent the following medications to your pharmacy for you to pick up at your convenience: Dicyclomine 10 mg four times daily 30 minutes before each meal and at bedtime.  Refilled Zofran and Promethazine  Keep in touch and let me know how you are doing.   Ellouise Newer, [place]

## 2020-01-16 NOTE — Progress Notes (Signed)
Reviewed and agree with management plan.  Waver Dibiasio T. Rosena Bartle, MD FACG [facility]  

## 2020-01-21 ENCOUNTER — Encounter: Payer: Self-pay | Admitting: Cardiology

## 2020-01-21 ENCOUNTER — Telehealth: Payer: Self-pay | Admitting: Nurse Practitioner

## 2020-01-21 NOTE — Telephone Encounter (Signed)
° °  Pt calling and would like to speak with Truitt Merle or her nurse. She said she would like to give an update with her elevated BP and other symptoms. She doesn't have her BP readings right now but will be ready when she receive call back. She also mention she had seizures recently and everyday been feeling weak or about to passed out. She said to call her tomorrow after 1:30 pm  Please call.

## 2020-01-21 NOTE — Telephone Encounter (Signed)
error 

## 2020-01-22 NOTE — Telephone Encounter (Signed)
I spoke with patient. She checks BP at home and at rest it runs 150-160/70-80. Goes up with activity. Was 164/100 when she saw GI recently.  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  [place] on Christmas Eve and was seen at [facility] ED.  She does not remember much of this but did fracture her sternum. She has not been seen in our office since July 2019.  I discussed scheduling an office visit with patient but she is concerned about cost. She cannot drive and is interested in possibly doing a telemedicine visit.  Cannot do AM appointments due to her medications.   Will forward to [facility] to see if she would like to schedule patient for a virtual appointment.

## 2020-01-23 NOTE — Telephone Encounter (Signed)
Pt's mailbox full and not excepting vm.  Will try later.

## 2020-01-23 NOTE — Telephone Encounter (Signed)
I would like for her to reach out to psyche who apparently has given her Minipress for her PTSD - this also helps with HTN - would this be an option.   It is ok to refill her other medicines from me if needed.   Cecille Rubin

## 2020-01-26 NOTE — Telephone Encounter (Signed)
S/w pt is aware of Lori's recommendation's.  Pt needs refills on Losartan and Metoprolol # 90 filled today per [facility] to requested pharmacy for 1 year.  Pt see psyche doctor tomorrow and has discussed increasing pt's minipress.  Pt will discuss with doctor and send Cecille Rubin a message when pt hears back from psyche doctor.  Pt stated has two psyche doctor's, one for medications and one for internal issues.

## 2020-01-26 NOTE — Telephone Encounter (Signed)
Noted.   Deanna George 

## 2020-01-27 ENCOUNTER — Telehealth: Payer: Self-pay | Admitting: Adult Health

## 2020-01-27 ENCOUNTER — Other Ambulatory Visit: Payer: Self-pay

## 2020-01-27 ENCOUNTER — Ambulatory Visit (INDEPENDENT_AMBULATORY_CARE_PROVIDER_SITE_OTHER): Payer: Self-pay | Admitting: Psychiatry

## 2020-01-27 DIAGNOSIS — F909 Attention-deficit hyperactivity disorder, unspecified type: Secondary | ICD-10-CM

## 2020-01-27 DIAGNOSIS — R251 Tremor, unspecified: Secondary | ICD-10-CM

## 2020-01-27 DIAGNOSIS — F332 Major depressive disorder, recurrent severe without psychotic features: Secondary | ICD-10-CM

## 2020-01-27 DIAGNOSIS — R69 Illness, unspecified: Secondary | ICD-10-CM

## 2020-01-27 DIAGNOSIS — R5382 Chronic fatigue, unspecified: Secondary | ICD-10-CM

## 2020-01-27 DIAGNOSIS — F4001 Agoraphobia with panic disorder: Secondary | ICD-10-CM

## 2020-01-27 DIAGNOSIS — R296 Repeated falls: Secondary | ICD-10-CM

## 2020-01-27 DIAGNOSIS — G9332 Myalgic encephalomyelitis/chronic fatigue syndrome: Secondary | ICD-10-CM

## 2020-01-27 DIAGNOSIS — M797 Fibromyalgia: Secondary | ICD-10-CM

## 2020-01-27 DIAGNOSIS — F4310 Post-traumatic stress disorder, unspecified: Secondary | ICD-10-CM

## 2020-01-27 DIAGNOSIS — F5104 Psychophysiologic insomnia: Secondary | ICD-10-CM

## 2020-01-27 NOTE — Telephone Encounter (Signed)
Deanna George has spoken with her cardiologist recently and he wants her to increase her Prazosin to 2.5mg -3mg .  Due to blood pressure issues and other factors.  If you agree with this, please advise. She has some 2mg  on hand and can cut them in half to increase to 2.5mg .  If you want her to go to the 3mg  she would need a new script.  Send to [facility] on [address] [place]

## 2020-01-27 NOTE — Telephone Encounter (Signed)
She can increase to the 2.5mg .

## 2020-01-27 NOTE — Progress Notes (Signed)
Psychotherapy Progress Note [facility] [name], PhD LP  Patient ID: Deanna George     MRN: [id number] Therapy format: Individual psychotherapy Date: 01/27/2020      Start: 2:15p     Stop: 3:15p     Time Spent: 50 min (remainder donated) Location: In-person   Session narrative (presenting needs, interim history, self-report of stressors and symptoms, applications of prior therapy, status changes, and interventions made in session) Remains on 2mg  prazosin and cardiology wants info from here before adjusting losartan and metoprolol.  Meanwhile, successfully dosing around midnight and falling asleep closer to 3am than 6am.  Applauded progress shifting badly altered sleep cycle which must play a role in narcoleptic-like episodes.  States no caffeine involved.  Will wake after 3-4 hrs for cat or for urination, tries to go back to bed, with mixed success.  One issue afoot is relations with her birth family.  Discussed at some length today, where birth mother last fall marginally admitted lying about her, birth aunt Margarita Grizzle was in touch 12/30, then this weekend getting texts from aunt and birth mother.  Asks to review them in session to check her judgment in responding.  Overall, messages appear breezy and light but trigger strong doubt and resentment in PT for not acknowledging things she thinks should be obvious and apologized for.  Coaxed to take a broader view, mainly that the people she is speaking of -- despite coming on strong and glowing as the story broke of long lost "Shelton Silvas" (her birth name) finding them -- do not have a long acquaintance with her, and the whole situation for them would be like adopting a daughter/niece/cousin as an adult, almost a step-family or stranger marrying into the family.  It is unrealistic to expect them to feel as warmly and be as insightful as PT about what she herself needs.  Difficult to convey, as PT is invested in her idea of how it should  have been and still should, reuniting, but began to succeed in showing her that it is her trauma and pain that drive some deeply wishful thinking about how others adjust.  Impressed on her that she does not have to push, rush, or demand anything with them, and she does not have to read in demands from them, especially in text, which can be notoriously unclear.  Financially, has gone ahead and vacated her last retirement account now, has $15K available after taxes, $11K to pay on mortgage.  Discussed judgment for keeping some on hand for margin.  Has already begun to solicit a bankruptcy attorney for relief from other bills.    With no time left, asks again about [place] supporting her in a bid to get on the Dr. Abbe Amsterdam show.  Informed I would be willing to endorse her if it would not become a goose chase that interferes with getting tertiary treatment from [facility] or another center as recommended by three of her medical specialists.  Can only afford to write a voucher letter, not a thorough summary of her case, and I am not fundamentally interested in an attempt to be on the show, only whatever means will help connect her to stronger treatment for her multiple, overlapping, sometimes mysterious issues.  Therapeutic modalities: Cognitive Behavioral Therapy and Solution-Oriented/Positive Psychology  Mental Status/Observations:  Appearance:   Casual     Behavior:  Appropriate  Motor:  Normal  Speech/Language:   Clear and Coherent  Affect:  Appropriate  Mood:  anxious and  depressed  Thought process:  normal  Thought content:    WNL and possible illusions  Sensory/Perceptual disturbances:    WNL  Orientation:  Fully oriented  Attention:  Good  Concentration:  Fair  Memory:  grossly intact  Insight:    Good  Judgment:   Fair  Impulse Control:  Fair   Risk Assessment: Danger to Self: No Self-injurious Behavior: No Danger to Others: No Physical Aggression / Violence: No Duty to Warn: No Access  to Firearms a concern: No  Assessment of progress:  stabilized  Diagnosis:   ICD-10-CM   1. Severe episode of recurrent major depressive disorder, without psychotic features ([place])  F33.2   2. PTSD (post-traumatic stress disorder)  F43.10   3. Psychophysiological insomnia  F51.04   4. Attention deficit hyperactivity disorder (ADHD), unspecified ADHD type  F90.9   5. Chronic fatigue syndrome with fibromyalgia  R53.82    M79.7   6. Panic disorder with agoraphobia and moderate panic attacks  F40.01   7. Physiological tremor  R25.1   8. Unexplained falls  R29.6   9. r/o cervical nerve impingement AND mitochondiral disease  R69    Plan:  . Continue dosing night meds closer to actual sleep time, try to roll back falling asleep closer to midnight . Resolved to let family matters ride -- birth family politics, stepM estranged, and even her son's distance -- in favor of prioritizing efforts recontacting her resources and taking care of tangible business  . Refer to [facility] for further assistance with mentoring, decision-making, and support . Refer to [facility] for possible financial counseling. Marland Kitchen Refer to case worker for specific guidance invoking Medicaid and disability payments once assets are below threshold -- do not hesitate to call . Other recommendations/advice as may be noted above . Continue to utilize previously learned skills ad lib . Maintain medication as prescribed and work faithfully with relevant prescriber(s) if any changes are desired or seem indicated . Call the clinic on-call service, present to ER, or call 911 if any life-threatening psychiatric crisis . Return in about 2 weeks (around 02/10/2020).Marland Kitchen  Next scheduled visit in this office 02/10/2020.  [name], PhD [name], PhD LP Clinical Psychologist, [facility] [address], [place], [place] [address] [phone]

## 2020-01-28 ENCOUNTER — Ambulatory Visit: Payer: Medicaid Other | Admitting: Gastroenterology

## 2020-01-28 NOTE — Telephone Encounter (Signed)
Tried to reach patient with information but no answer and her mail box is full.

## 2020-01-28 NOTE — Telephone Encounter (Signed)
Pt. Did not answer and VM was full. I will try again this afternoon.

## 2020-01-31 ENCOUNTER — Other Ambulatory Visit: Payer: Self-pay | Admitting: Adult Health

## 2020-01-31 ENCOUNTER — Other Ambulatory Visit: Payer: Self-pay | Admitting: Nurse Practitioner

## 2020-01-31 DIAGNOSIS — F4310 Post-traumatic stress disorder, unspecified: Secondary | ICD-10-CM

## 2020-01-31 DIAGNOSIS — G4700 Insomnia, unspecified: Secondary | ICD-10-CM

## 2020-02-02 NOTE — Telephone Encounter (Signed)
Last apt 11/25/2019, increased to 100 mg and was due back 4 weeks

## 2020-02-04 NOTE — Telephone Encounter (Signed)
Tried calling pt. Again today. VM is full unable to leave a message.

## 2020-02-05 NOTE — Telephone Encounter (Signed)
Still unable to leave a message. I am closing this out. If she calls back please relay the message.

## 2020-02-09 ENCOUNTER — Other Ambulatory Visit: Payer: Self-pay | Admitting: Adult Health

## 2020-02-09 DIAGNOSIS — G4700 Insomnia, unspecified: Secondary | ICD-10-CM

## 2020-02-09 DIAGNOSIS — F4310 Post-traumatic stress disorder, unspecified: Secondary | ICD-10-CM

## 2020-02-10 ENCOUNTER — Ambulatory Visit (INDEPENDENT_AMBULATORY_CARE_PROVIDER_SITE_OTHER): Payer: Medicaid Other | Admitting: Psychiatry

## 2020-02-10 ENCOUNTER — Other Ambulatory Visit: Payer: Self-pay

## 2020-02-10 DIAGNOSIS — F4310 Post-traumatic stress disorder, unspecified: Secondary | ICD-10-CM

## 2020-02-10 DIAGNOSIS — F332 Major depressive disorder, recurrent severe without psychotic features: Secondary | ICD-10-CM

## 2020-02-10 DIAGNOSIS — F909 Attention-deficit hyperactivity disorder, unspecified type: Secondary | ICD-10-CM

## 2020-02-10 DIAGNOSIS — R5382 Chronic fatigue, unspecified: Secondary | ICD-10-CM

## 2020-02-10 DIAGNOSIS — F5104 Psychophysiologic insomnia: Secondary | ICD-10-CM

## 2020-02-10 DIAGNOSIS — M797 Fibromyalgia: Secondary | ICD-10-CM

## 2020-02-10 DIAGNOSIS — F4001 Agoraphobia with panic disorder: Secondary | ICD-10-CM

## 2020-02-10 NOTE — Progress Notes (Signed)
Psychotherapy Progress Note [facility] Deanna Moore, PhD LP  Patient ID: Deanna George     MRN: [id number] Therapy format: Individual psychotherapy Date: 02/10/2020      Start: 2:14p     Stop: 3:04p     Time Spent: 30 min (remainder donated) Location: In-person   Session narrative (presenting needs, interim history, self-report of stressors and symptoms, applications of prior therapy, status changes, and interventions made in session) Got med advice 2 wks ago to go to 2.71m prazosin but can't break capsules, went ahead to 479m(2 caps) without incident.  Wanted to verify this is OK.  Confirmed with prescriber and encouraged to get back on schedule with her.    Insurance -- Medicaid is not yet active, remains self-pay basis here.  Hopefully M'caid coming through soon.  Briefed on the ramifications of for service in general medicine and in treatment here.  Daughter [place] this [place], by surprise, now employed with [employer] father's company).  Divulged that he feels his sister or his mother one are always mad at him.  Word that summer reneged on a dinner date with her dad, but found out that she had met dad's new girlfriend (noting that he is at least bisexual, maybe has her as a "beard").  Bothered Pt in that Summer has a pledge to let Pt know what she encounters in ex-H's life, even if she thinks it will hurt.  Checked facts with Summer, who says she never agreed to meet [name]'s GF and shut the door on talking about it.  PT felt wounded, turned morose after she left.  Gives background on Summer and her father being estranged for 8 years of custody fight, hx of him scaring her with his temper, work with counselors BiPhotographernd DaGeorgia Domttempting to fulfill court ordered interaction but Summer eventually ending it b/c she had done nearly all of the maturing and saw through her father's addiction to maintaining his image and  being gay without admitting anything, and she promised (PT's emphasis) she would never do as her brother was and, purportedly, suck up to [facility] fail to hold him to account if it meant selling out what PT had been through.  Hx of Summer being very forthright and principled, fiercely defensive of PT that way, eventually acknowledges being very hurt first to learn there was contact she hadn't heard about and then that Summer wouldn't open up about it, triggering suspicion of her either changing sides or losing her principles.    Admittedly has felt, though knows better, that the other woman was "replacement" for herself.  Processed the sneaky feeling that Summer had changed on her and was in some fashion selling out, as HuYong Channelllegedly has.  Supportively confronted how PT had very much enjoyed having a deInternational aid/development workerike Summer and how anything that seemed to deviate from that, once experienced, would feel like an alarm.  Drawn to see how Summer was not betraying her in any way by meeting her father or his girlfriend or by keeping it to herself, but that she was (a) trying what she could, outside the artificiality of family therapy, to nurture a more functional relationship with her other biological parent, (b) protecting PT against her own tendency to get wounded, (c) try to recapture the good in her father ([place] and (d) in accepting some amount of money, be pragmatic about  some form of compensation from her father for being denied the chance to reconcile earlier.  In all of these, being the young adult child PT had raised her to be, still principled, and stronger about not letting herself be victimized.   Further interpreted that there is a strong, subconscious payoff to PT soaking in the idea that her one true protector/defender/family member has abandoned her -- it (e) fits the great theme of people always letting her down, (f) spares her the anxiety of trying to hope, and (g) grants  her license to end her own emotional civil war through suicide, like her depression wants to.  PT acknowledges, nodding vigorously at points, still difficult to believe (as anything can be, with her life history of manipulation, betrayal, and abandonment), but assured that that is the seductive power of it talking, especially the license to suicide point.  At worst, Summer is only in Engineering geologist relations with "the [place]" she has in her father.  Re. [name], made sure to note how he spontaneously offered to visit her when he could have come through town without letting her know, either gave or openly accepted a hug, and spoke openly and trustingly both of happenings in the fractured family and of his own complaint that one of them is always mad at him -- none of which is gaming, betraying, or manipulating of anyone -- and that for [facility] to maintain relationship with "the enemy" is also his prerogative, as she has maintained it should be.  Challenged to guard her own mind against the kind of paranoia that would get her to demonize her own children and compared to another patient's similar struggle.  Agrees.  With time run out, returns to the subject of help getting on the Dr. Abbe Amsterdam show.  Brought copies of email from a year ago with the producer, suggesting [place] could provide simple message asking she be considered.  Agreed to table it until next session and to better establish what the goal is and the protocol for applying to be a subject, maintaining that it must be for the help and resources it could mean, not for either to exploit the other.  Therapeutic modalities: Cognitive Behavioral Therapy and Solution-Oriented/Positive Psychology  Mental Status/Observations:  Appearance:   Casual     Behavior:  Appropriate  Motor:  Normal  Speech/Language:   Clear and Coherent  Affect:  Appropriate and no panic  Mood:  anxious and depressed  Thought process:  normal  Thought content:    Paranoid  Ideation  Sensory/Perceptual disturbances:    WNL  Orientation:  Fully oriented  Attention:  PSY: Good  Concentration:  PSY: Good  Memory:  grossly intact  Insight:    PSY: Fair  Judgment:   PSY: Good  Impulse Control:  PSY: Fair   Risk Assessment: Danger to Self: Yes.  without intent/plan Self-injurious Behavior: No Danger to Others: No Physical Aggression / Violence: No Duty to Warn: No Access to Firearms a concern: No  Assessment of progress:  progressing  Diagnosis:   ICD-10-CM   1. Severe episode of recurrent major depressive disorder, without psychotic features ([place])  F33.2   2. PTSD (post-traumatic stress disorder)  F43.10   3. Psychophysiological insomnia  F51.04   4. Attention deficit hyperactivity disorder (ADHD), unspecified ADHD type  F90.9   5. Chronic fatigue syndrome with fibromyalgia  R53.82    M79.7   6. Panic disorder with agoraphobia and moderate panic attacks  F40.01  Plan:  . Pledge of safety and guard against temptation to demonize her kids' motives and actions as license to give in.  Fight to see the best and the true in what they are doing . Self-assure that very unwanted ex-husband's relationship is no threat whatsoever to her significance, womanhood, or value . Other recommendations/advice as may be noted above . Continue to utilize previously learned skills ad lib . Defer making application to the Dr. Abbe Amsterdam Show - need more information to understand the opportunity, the request, and any protocol involved in endorsing a patient to be involved . Maintain medication as prescribed and work faithfully with relevant prescriber(s) if any changes are desired or seem indicated.  Most likely will pursue further increases in prazosin until more therapeutic. . Call the clinic on-call service, present to ER, or call 911 if any life-threatening psychiatric crisis Return in about 2 weeks (around 02/24/2020).  See prescriber soon -- over 6 weeks overdue, has questions  about a friend-recommended medication for night time. . Already scheduled visit in this office 02/24/2020.  [name], PhD Deanna Moore, PhD LP Clinical Psychologist, [facility] [address] [place], [place] [address] [phone]

## 2020-02-24 ENCOUNTER — Other Ambulatory Visit: Payer: Self-pay | Admitting: Adult Health

## 2020-02-24 ENCOUNTER — Ambulatory Visit (INDEPENDENT_AMBULATORY_CARE_PROVIDER_SITE_OTHER): Payer: Self-pay | Admitting: Psychiatry

## 2020-02-24 ENCOUNTER — Other Ambulatory Visit: Payer: Self-pay

## 2020-02-24 DIAGNOSIS — F4001 Agoraphobia with panic disorder: Secondary | ICD-10-CM

## 2020-02-24 DIAGNOSIS — G4700 Insomnia, unspecified: Secondary | ICD-10-CM

## 2020-02-24 DIAGNOSIS — R296 Repeated falls: Secondary | ICD-10-CM

## 2020-02-24 DIAGNOSIS — F4310 Post-traumatic stress disorder, unspecified: Secondary | ICD-10-CM

## 2020-02-24 DIAGNOSIS — F332 Major depressive disorder, recurrent severe without psychotic features: Secondary | ICD-10-CM

## 2020-02-24 DIAGNOSIS — M797 Fibromyalgia: Secondary | ICD-10-CM

## 2020-02-24 DIAGNOSIS — R5382 Chronic fatigue, unspecified: Secondary | ICD-10-CM

## 2020-02-24 MED ORDER — PRAZOSIN HCL 2 MG PO CAPS: ORAL_CAPSULE | ORAL | 5 refills | Status: DC

## 2020-02-24 NOTE — Progress Notes (Signed)
Psychotherapy Progress Note [facility] Deanna Moore, PhD LP  Patient ID: Deanna George     MRN: [id number] Therapy format: Individual psychotherapy Date: 02/24/2020      Start: 2:04p     Stop: 2:55p     Time Spent: 51 min Location: In-person   Session narrative (presenting needs, interim history, self-report of stressors and symptoms, applications of prior therapy, status changes, and interventions made in session) Billing question -- thinks she is being billed full rate, brought her bill.  Walked through understanding how it reads full rate then discount appropriate to uninsured status.  Discussed ramifications of  Medicaid and disability one activated.  Has continued to use 4 mg prazosin, now down to last dose tonight, has not approached Ms. Mozingo yet for move-ahead Rx.  Contacted during session, Rx to go in today.  Has continued to try to take it at midnight-1am rather than wait into the night, but again not able to sleep until near daylight.  Educated again that effective dose is higher for most people, refer back to prescriber for how to gauge with other meds she is on.  Dreams still distressing, prays Lord's Prayer ritualistically and practices self-suggestion that she is in control of her dreams.  Guided to consider praying not that she is in control what she dreams but that she always has the option to exit her dreams and perhaps to pray for God's help and reminder.  Probed worries -- lies awake worrying about finances, which are such that she has to get down below $2000 before Medicaid and disability income can activate.  Says she is supposed to call Medicaid when funds are low enough, but she is afraid to look.  Plan remains to ask a rental company to assess her home for rental, then decide whether to sell or rent.  Reviewed figures she has at this point, walked through the steps she intends to take when conditions are such, and practiced imagining the time when each  move is made as practice seeing past the horror of becoming poor and turning helpless and suicidal.  Encouraged to continue practicing thinking of what she knows she will do and seeing the math work for income and expenses any time she is tempted to worry.  Informed of Section 8 and disability-related subsidized housing program.    Meanwhile, PT working actively on her [facility] emergency room bill.  Encouraged to journal, if possible, noting at night 3 accomplishment of any degree and at least 1 intention for tomorrow as self-validation and motivational focus.  Neighbor is a Marine scientist, on [place] for her own anxiety issues and states she sleeps quite well with it.  PT interested in trying it, referred back to Ms. Mozingo.  On no melatonin at this point, after having been on as high as 60 mg/night a couple months back.    Has gotten her Real bracelet now.  Says Medicaid will enable her to have a home-based safety monitor in case she falls out again.    Again asks to discuss her interest in going on Dr. Abbe Amsterdam show.  Clarified that I am willing to write an endorsement once she has restarted contact with the producer, just not try to solicit for her cold.  Agreed that if she is selected there could be resources available and that this is reason enough, but we should still prepare to endorse her for referral to [facility] and make sure we don't pass up any efforts to get  hold of her insomnia, worry, and posttraumatic responses while we communicate.  Agrees.  Therapeutic modalities: Cognitive Behavioral Therapy, Solution-Oriented/Positive Psychology and Psycho-education/Bibliotherapy  Mental Status/Observations:  Appearance:   Casual     Behavior:  Appropriate  Motor:  Normal  Speech/Language:   Clear and Coherent  Affect:  Constricted  Mood:  anxious  Thought process:  normal  Thought content:    WNL  Sensory/Perceptual disturbances:    WNL  Orientation:  Fully oriented  Attention:  Good     Concentration:  Fair  Memory:  WNL  Insight:    Fair  Judgment:   Fair  Impulse Control:  Fair   Risk Assessment: Danger to Self: No Self-injurious Behavior: No Danger to Others: No Physical Aggression / Violence: No Duty to Warn: No Access to Firearms a concern: No  Assessment of progress:  stabilized  Diagnosis:   ICD-10-CM   1. PTSD (post-traumatic stress disorder)  F43.10   2. Insomnia, unspecified type  G47.00   3. Severe episode of recurrent major depressive disorder, without psychotic features ([place])  F33.2   4. Panic disorder with agoraphobia and moderate panic attacks  F40.01   5. Chronic fatigue syndrome with fibromyalgia  R53.82    M79.7   6. Unexplained falls  R29.6    Plan:  . Continue titrating prazosin, keep seeking to give in to sleep earlier . Nightly journal -- 3 accomplishments, 1 intention . Practice imagining the steps she will take when tempted to feel helpless . Will endorse her for Dr. Abbe Amsterdam show after she resumes contact with one of the producers and finds out the process for herself . Other recommendations/advice as may be noted above . Continue to utilize previously learned skills ad lib . Maintain medication as prescribed and work faithfully with relevant prescriber(s) if any changes are desired or seem indicated . Call the clinic on-call service, present to ER, or call 911 if any life-threatening psychiatric crisis Return in about 2 weeks (around 03/09/2020). . Already scheduled visit in this office 03/09/2020.  [name], PhD Deanna Moore, PhD LP Clinical Psychologist, [facility] [address], [place], [place] [address] [phone]

## 2020-03-09 ENCOUNTER — Encounter: Payer: Self-pay | Admitting: Adult Health

## 2020-03-09 ENCOUNTER — Ambulatory Visit (INDEPENDENT_AMBULATORY_CARE_PROVIDER_SITE_OTHER): Payer: Medicaid Other | Admitting: Psychiatry

## 2020-03-09 ENCOUNTER — Other Ambulatory Visit: Payer: Self-pay

## 2020-03-09 ENCOUNTER — Ambulatory Visit (INDEPENDENT_AMBULATORY_CARE_PROVIDER_SITE_OTHER): Payer: Self-pay | Admitting: Adult Health

## 2020-03-09 DIAGNOSIS — F4310 Post-traumatic stress disorder, unspecified: Secondary | ICD-10-CM

## 2020-03-09 DIAGNOSIS — F331 Major depressive disorder, recurrent, moderate: Secondary | ICD-10-CM

## 2020-03-09 DIAGNOSIS — F4001 Agoraphobia with panic disorder: Secondary | ICD-10-CM

## 2020-03-09 DIAGNOSIS — R5382 Chronic fatigue, unspecified: Secondary | ICD-10-CM

## 2020-03-09 DIAGNOSIS — F411 Generalized anxiety disorder: Secondary | ICD-10-CM

## 2020-03-09 DIAGNOSIS — M797 Fibromyalgia: Secondary | ICD-10-CM

## 2020-03-09 DIAGNOSIS — F909 Attention-deficit hyperactivity disorder, unspecified type: Secondary | ICD-10-CM

## 2020-03-09 DIAGNOSIS — G4700 Insomnia, unspecified: Secondary | ICD-10-CM

## 2020-03-09 DIAGNOSIS — F5104 Psychophysiologic insomnia: Secondary | ICD-10-CM

## 2020-03-09 MED ORDER — QUETIAPINE FUMARATE 100 MG PO TABS: 100.0000 mg | ORAL_TABLET | Freq: Every day | ORAL | 5 refills | Status: DC

## 2020-03-09 MED ORDER — CLONAZEPAM 0.5 MG PO TABS: 0.5000 mg | ORAL_TABLET | Freq: Two times a day (BID) | ORAL | 2 refills | Status: DC | PRN

## 2020-03-09 MED ORDER — DULOXETINE HCL 60 MG PO CPEP: 120.0000 mg | ORAL_CAPSULE | Freq: Every day | ORAL | 5 refills | Status: DC

## 2020-03-09 NOTE — Progress Notes (Signed)
ZAVIERA SIDDIQUI [id number] 1966-01-04 54 y.o.  Subjective:   Patient ID:  Deanna George is a 54 y.o. (DOB [birth year]) female.  Chief Complaint: No chief complaint on file.   HPI Deanna George presents to the office today for follow-up of MDD, PTSD, GAD, panic disorder, insomnia  Describes mood today as "about the same". Pleasant. Communicative. Mood symptoms - reports depression and anxiety. Denies irritability. Panic attacks "having them frequently". Having "moments" of dread. Stating "I feel overwhelmed". Approved for disability - taken away 5 days after she got it. Continues to work with Dr. Rica Mote. Varying interest and motivation. Taking medications as prescribed.  Energy levels low - "very fatigued". Active, does not have a regular exercise routine. Disabled. Enjoys some usual interests and activities. Single. Lives alone with her cat and 2 parakeets. Talking to family and friends. Neighbor across the stress. Appetite adequate. Weight gain 8 to 10 pounds.  Sleep has improved. Averages 5 to 6 hours. Taking Seroquel at 50mg  and it is no longer working as well.  Focus and concentration difficulties - ADHD. Completing some tasks. Managing some aspects of household - did dishes yesterday - it's been 10 days since cleaning. Vacuumed yesterday - first time since Christmas Eve.  Denies SI or HI. Denies AH or VH.  Previous medications: Ritalin and Thorazine - others, Trazadone    Review of Systems:  Review of Systems  Musculoskeletal: Negative for gait problem.  Neurological: Negative for tremors.  Psychiatric/Behavioral:       Please refer to HPI    Medications: I have reviewed the patient's current medications.  Current Outpatient Medications  Medication Sig Dispense Refill  . albuterol (PROAIR HFA) 108 (90 Base) MCG/ACT inhaler 2 puffs as needed    . calcium carbonate (TUMS EX) 750 MG chewable tablet Chew 2 tablets by mouth 3 (three) times daily.    . Cholecalciferol (VITAMIN D3) 50  MCG (2000 UT) capsule 1 capsule    . cyclobenzaprine (FLEXERIL) 10 MG tablet TAKE 1 TABLET BY MOUTH EVERY 8 HOURS AS NEEDED FOR 30 DAYS    . diazepam (VALIUM) 5 MG tablet Take 1 tablet (5 mg total) by mouth 2 (two) times daily as needed. for anxiety 60 tablet 2  . dicyclomine (BENTYL) 10 MG capsule Take 1 capsule (10 mg total) by mouth 4 (four) times daily -  before meals and at bedtime. 120 capsule 5  . diphenhydrAMINE (BENADRYL) 25 mg capsule Take 25 mg by mouth every 6 (six) hours as needed for allergies.    . DULoxetine (CYMBALTA) 60 MG capsule Take 2 capsules (120 mg total) by mouth daily. 60 capsule 1  . ibuprofen (ADVIL) 800 MG tablet 1 tablet with food or milk as needed    . losartan (COZAAR) 50 MG tablet TAKE 1 TABLET BY MOUTH TWICE DAILY MAKE  A  OVERDUE  APPT  WITH  THE  DOCTOR  BEFORE  ANYMORE  REFILLS. 60 tablet 1  . Melatonin 10 MG CAPS Take 10 mg by mouth at bedtime as needed.     . metoprolol tartrate (LOPRESSOR) 25 MG tablet TAKE 1 TABLET BY MOUTH TWICE DAILY -  PLEASE  CALL  OFFICE  FOR  YEARLY  APPOINTMENT,  FOR  FURTHER  REFILLS  [id number]) 60 tablet 1  . metoprolol tartrate (LOPRESSOR) 50 MG tablet Take 1 tablet (50 mg total) by mouth 2 (two) times daily. 180 tablet 3  . nortriptyline (PAMELOR) 25 MG capsule Take 2 capsules (50 mg total)  by mouth at bedtime. 60 capsule 6  . omeprazole (PRILOSEC) 40 MG capsule Take 1 capsule (40 mg total) by mouth daily. (Patient taking differently: Take 40 mg by mouth in the morning and at bedtime. ) 30 capsule 5  . ondansetron (ZOFRAN) 4 MG tablet Take 1 tablet (4 mg total) by mouth every 6 (six) hours as needed for nausea or vomiting. 30 tablet 11  . prazosin (MINIPRESS) 2 MG capsule TAKE 1 CAPSULE (2 MG TOTAL) BY MOUTH AT BEDTIME. (Patient taking differently: Take 4 mg by mouth at bedtime. ) 90 capsule 1  . prazosin (MINIPRESS) 2 MG capsule Take two capsules at bedtime. 60 capsule 5  . Probiotic Product (PROBIOTIC-10 PO) Take by mouth  daily.    . promethazine (PHENERGAN) 25 MG tablet Take 1 tablet (25 mg total) by mouth every 6 (six) hours as needed for nausea or vomiting. 30 tablet 11  . QUEtiapine (SEROQUEL) 50 MG tablet TAKE 1 TABLET BY MOUTH EVERYDAY AT BEDTIME 90 tablet 1  . rizatriptan (MAXALT-MLT) 10 MG disintegrating tablet Take 1 tablet (10 mg total) by mouth as needed. May repeat in 2 hours if needed 15 tablet 4  . SUMAtriptan (IMITREX) 50 MG tablet Take 50 mg by mouth every 2 (two) hours as needed for migraine. May repeat in 2 hours if headache persists or recurs.    . SUMAtriptan (IMITREX) 6 MG/0.5ML SOLN injection Inject 0.5 mLs (6 mg total) into the skin as needed for migraine. May repeat in 2 hours if headache persists or recurs. 10 vial 4  . traMADol (ULTRAM) 50 MG tablet TAKE ONE TABLET BY MOUTH AS NEEDED FOR HEADACHE 30 tablet 0   No current facility-administered medications for this visit.    Medication Side Effects: None  Allergies: No Known Allergies  Past Medical History:  Diagnosis Date  . Anal fissure   . Cervical disc disorder   . Clostridium difficile infection   . Dysrhythmia    tachycardia  . Esophageal stricture   . GERD (gastroesophageal reflux disease)   . Headache   . Hemorrhoids   . Hypertension   . IBS (irritable bowel syndrome)   . Interstitial cystitis   . Noncompliance    pt denies  . OA (osteoarthritis) of knee   . Palpitations   . Pneumonia   . 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 Situational stress   . Urinary tract infection     Family History  Adopted: Yes  Problem Relation Age of Onset  . Colon cancer Neg Hx   . Pancreatic cancer Neg Hx   . Stomach cancer Neg Hx   . Rectal cancer Neg Hx     Social History   Socioeconomic History  . Marital status: Legally Separated    Spouse name: Not on file  . Number of children: 2  . Years of education: Not on file  . Highest education level: Not on file  Occupational History  . Occupation: student     Comment: graduated 2018  Tobacco Use  . Smoking status: Current Some Day Smoker  . Smokeless tobacco: Never Used  Substance and Sexual Activity  . Alcohol use: Not Currently    Comment: Rare  . Drug use: Not Currently  . Sexual activity: Never  Other Topics Concern  . Not on file  Social History Narrative  . Not on file   Social Determinants of Health   Financial Resource Strain:   . Difficulty of Paying Living  Expenses:   Food Insecurity:   . Worried About Charity fundraiser in the Last Year:   . Arboriculturist in the Last Year:   Transportation Needs:   . Film/video editor (Medical):   Marland Kitchen Lack of Transportation (Non-Medical):   Physical Activity:   . Days of Exercise per Week:   . Minutes of Exercise per Session:   Stress:   . Feeling of Stress :   Social Connections:   . Frequency of Communication with Friends and Family:   . Frequency of Social Gatherings with Friends and Family:   . Attends Religious Services:   . Active Member of Clubs or Organizations:   . Attends Archivist Meetings:   Marland Kitchen Marital Status:   Intimate Partner Violence:   . Fear of Current or Ex-Partner:   . Emotionally Abused:   Marland Kitchen Physically Abused:   . Sexually Abused:     Past Medical History, Surgical history, Social history, and Family history were reviewed and updated as appropriate.   Please see review of systems for further details on the patient's review from today.   Objective:   Physical Exam:  There were no vitals taken for this visit.  Physical Exam Constitutional:      General: She is not in acute distress. Musculoskeletal:        General: No deformity.  Neurological:     Mental Status: She is alert and oriented to person, place, and time.     Coordination: Coordination normal.  Psychiatric:        Attention and Perception: Attention and perception normal. She does not perceive auditory or visual hallucinations.        Mood and Affect: Mood is anxious  and depressed. Affect is not labile, blunt, angry or inappropriate.        Speech: Speech normal.        Behavior: Behavior normal.        Thought Content: Thought content normal. Thought content is not paranoid or delusional. Thought content does not include homicidal or suicidal ideation. Thought content does not include homicidal or suicidal plan.        Cognition and Memory: Cognition and memory normal.        Judgment: Judgment normal.     Comments: Insight intact     Lab Review:     Component Value Date/Time   NA 135 11/04/2019 1557   NA 140 03/27/2017 1543   K 3.9 11/04/2019 1557   CL 101 11/04/2019 1557   CO2 24 11/04/2019 1557   GLUCOSE 171 (H) 11/04/2019 1557   BUN 6 11/04/2019 1557   BUN 6 03/27/2017 1543   CREATININE 0.82 11/04/2019 1557   CREATININE 0.95 09/28/2017 1847   CALCIUM 9.4 11/04/2019 1557   PROT 7.0 11/04/2019 1557   PROT 7.2 03/27/2017 1543   ALBUMIN 4.3 11/04/2019 1557   ALBUMIN 4.5 03/27/2017 1543   AST 18 11/04/2019 1557   ALT 15 11/04/2019 1557   ALKPHOS 85 11/04/2019 1557   BILITOT 0.4 11/04/2019 1557   BILITOT 0.2 03/27/2017 1543   GFRNONAA 70 09/28/2017 1847   GFRAA 82 09/28/2017 1847       Component Value Date/Time   WBC 7.4 11/04/2019 1557   RBC 4.42 11/04/2019 1557   HGB 12.1 11/04/2019 1557   HGB 12.5 03/27/2017 1543   HCT 36.7 11/04/2019 1557   HCT 39.0 03/27/2017 1543   PLT 196.0 11/04/2019 1557   PLT 248 03/27/2017  1543   MCV 83.2 11/04/2019 1557   MCV 87 03/27/2017 1543   MCH 28.0 03/27/2017 1543   MCH 28.5 06/12/2016 1526   MCHC 33.0 11/04/2019 1557   RDW 13.9 11/04/2019 1557   RDW 13.6 03/27/2017 1543   LYMPHSABS 1.6 11/04/2019 1557   LYMPHSABS 1.7 03/27/2017 1543   MONOABS 0.4 11/04/2019 1557   EOSABS 0.1 11/04/2019 1557   EOSABS 0.1 03/27/2017 1543   BASOSABS 0.1 11/04/2019 1557   BASOSABS 0.0 03/27/2017 1543    No results found for: POCLITH, LITHIUM   No results found for: PHENYTOIN, PHENOBARB, VALPROATE,  CBMZ   .res Assessment: Plan:    Continue:  Cymbalta 120mg  daily D/C Valium 5mg  BID prn - stopped a month ago Prazosin 2mg  - 2 at hs - nightmares Increase Seroquel 46m to 100mg  at hs for sleep Add Clonazepam 0.5mg  BID  Continue therapy with Jonni Sanger Mitchum  RTC 4 weeks  Patient advised to contact office with any questions, adverse effects, or acute worsening in signs and symptoms.  Discussed potential benefits, risk, and side effects of benzodiazepines to include potential risk of tolerance and dependence, as well as possible drowsiness.  Advised patient not to drive if experiencing drowsiness and to take lowest possible effective dose to minimize risk of dependence and tolerance.  Discussed potential benefits, risks, and side effects of stimulants with patient to include increased heart rate, palpitations, insomnia, increased anxiety, increased irritability, or decreased appetite.  Instructed patient to contact office if experiencing any significant tolerability issues.  There are no diagnoses linked to this encounter.   Please see After Visit Summary for patient specific instructions.  Future Appointments  Date Time Provider [place]  03/09/2020  2:00 PM [name], PhD CP-CP None  03/16/2020  2:00 PM [name], PhD CP-CP None  03/23/2020  2:00 PM [name], PhD CP-CP None  04/06/2020  2:00 PM [name], PhD CP-CP None  04/13/2020  2:00 PM [name], PhD CP-CP None    No orders of the defined types were placed in this encounter.   -------------------------------

## 2020-03-09 NOTE — Progress Notes (Signed)
Psychotherapy Progress Note [facility] [name], PhD LP  Patient ID: Deanna George     MRN: [id number] Therapy format: Individual psychotherapy Date: 03/09/2020      Start: 2:14p     Stop: 3:01p     Time Spent: 47 min Location: In-person   Session narrative (presenting needs, interim history, self-report of stressors and symptoms, applications of prior therapy, status changes, and interventions made in session) Med check earlier, raising prazosin further, adding Klonopin starting today.  Says she was apologetic for being away from med check so long and for making assumptions that everything she tells [place] is automatically shared with RX and RX decisions can be made that way.  Affirmed that, despite concerns for affordability, she does need to maintain her own relationship with her prescriber and represent her own needs directly, since it is impossible to clone what either [place] or RX know or understand for the other one.  Concur with medication advice.  Re. insomnia, had not used melatonin for a month trying to feel what benefit other medication moves can do, but last 5-7 nights back on it.  Trying to keep all meds before midnight still, understands Klonopin will be slower-starting and therefore dosed earlier than bedtime and in any case do not wait till 4am and frustrated to try to take sleep-assisting medications.  Will confirm 2-stage strategy for night medications (Klonopin, melatonin 1 hr before bedtime, quetiapine, prazosin more at bedtime).   Has a lead on a prefab tiny home in [place] near a friend of hers there, also named Solena.  Plans to look into it end of next month, with daughter Summer.  Friend has offered a cabin there as a backup option, with no time limit on staying there.  Meanwhile, the place she found is a place an elderly woman will be moving out of, offered at a bargain.  Pictures show pretty new construction, nice accommodations and innovative features and mobile  design, so free from fixed property tax burden and able to move it without having to consider weigh stations and trucking regulations.  Continues to fix up the house she is in and spend down her reserves.  Would rather not have to leave her house and connections here, but has not found anything acceptable here yet looking at the resources she has.  Discussed strategy at length, including type of housing, type of situation where to land it, and relative advantages of taking up [place] on her offer to relocate to [place] or maintain connections to this area including health care and having worked through disability and Medicaid process.  Agreed she is capable of handling relocation, just worth her while to research what work would be involved learning a new system and remaking health care and public service relationships.  Agrees, all things considered, better if she can spare the upheaval and create a situation here.  Re. fixing up her house for sale, finds herself taking 10 days to do get around to doing dishes.  Moving to paper plates to spare the exertion and fatigue.  Just a few dishes, standing up, and her arms begin to feel leaden.  Could be sleep deprivation, in part.  Will use caffeine by day.  Wants to paint but also heavily fatigued, is paying a handyman.    Therapeutic modalities: Cognitive Behavioral Therapy and Solution-Oriented/Positive Psychology  Mental Status/Observations:  Appearance:   Casual     Behavior:  Appropriate  Motor:  Normal  Speech/Language:  Clear and Coherent  Affect:  Appropriate and frequent need to de-mask to catch her breath  Mood:  anxious  Thought process:  normal  Thought content:    WNL  Sensory/Perceptual disturbances:    WNL  Orientation:  Fully oriented  Attention:  Good    Concentration:  Good  Memory:  WNL  Insight:    Good  Judgment:   Fair  Impulse Control:  Good   Risk Assessment: Danger to Self: No Self-injurious Behavior: No Danger to Others:  No Physical Aggression / Violence: No Duty to Warn: No Access to Firearms a concern: No  Assessment of progress:  progressing  Diagnosis:   ICD-10-CM   1. PTSD (post-traumatic stress disorder)  F43.10   2. Psychophysiological insomnia  F51.04   3. Chronic fatigue syndrome with fibromyalgia  R53.82    M79.7   4. Panic disorder with agoraphobia and moderate panic attacks  F40.01   5. Major depressive disorder, recurrent episode, moderate (HCC)  F33.1    Plan:  . [facility] for leads on how to relocate economically in the vicinity, at least as a comparison to the out-of-state plan . Look further into option for tiny homes and modest land rental here . Look into cleaning service and painters to take some of the housecleaning and painting burden (and run down reserves)  . Look into [employer] account as a destination for money others could provide . Regulate timing of night meds all before intended bedtime -- clonazepam and melatonin at least 1 hr ahead of bedtime or as directed by NP, quetiapine and prazosin at bedtime or as directed by NP -- [place] to confirm, will contact if different . Other recommendations/advice as may be noted above . Continue to utilize previously learned skills ad lib . Maintain medication as prescribed and work faithfully with relevant prescriber(s) if any changes are desired or seem indicated . Call the clinic on-call service, present to ER, or call 911 if any life-threatening psychiatric crisis Return in about 2 weeks (around 03/23/2020). . Already scheduled visit in this office 03/16/2020.  [name], PhD [name], PhD LP Clinical Psychologist, [facility] [address], [place], [place] [address] [phone]

## 2020-03-16 ENCOUNTER — Ambulatory Visit (INDEPENDENT_AMBULATORY_CARE_PROVIDER_SITE_OTHER): Payer: Self-pay | Admitting: Psychiatry

## 2020-03-16 ENCOUNTER — Other Ambulatory Visit: Payer: Self-pay

## 2020-03-16 DIAGNOSIS — G43109 Migraine with aura, not intractable, without status migrainosus: Secondary | ICD-10-CM

## 2020-03-16 DIAGNOSIS — F5104 Psychophysiologic insomnia: Secondary | ICD-10-CM

## 2020-03-16 DIAGNOSIS — R69 Illness, unspecified: Secondary | ICD-10-CM

## 2020-03-16 DIAGNOSIS — F411 Generalized anxiety disorder: Secondary | ICD-10-CM

## 2020-03-16 DIAGNOSIS — R251 Tremor, unspecified: Secondary | ICD-10-CM

## 2020-03-16 DIAGNOSIS — F909 Attention-deficit hyperactivity disorder, unspecified type: Secondary | ICD-10-CM

## 2020-03-16 DIAGNOSIS — R5382 Chronic fatigue, unspecified: Secondary | ICD-10-CM

## 2020-03-16 DIAGNOSIS — R296 Repeated falls: Secondary | ICD-10-CM

## 2020-03-16 DIAGNOSIS — F332 Major depressive disorder, recurrent severe without psychotic features: Secondary | ICD-10-CM

## 2020-03-16 DIAGNOSIS — G9332 Myalgic encephalomyelitis/chronic fatigue syndrome: Secondary | ICD-10-CM

## 2020-03-16 DIAGNOSIS — F4310 Post-traumatic stress disorder, unspecified: Secondary | ICD-10-CM

## 2020-03-16 DIAGNOSIS — F819 Developmental disorder of scholastic skills, unspecified: Secondary | ICD-10-CM

## 2020-03-16 DIAGNOSIS — M797 Fibromyalgia: Secondary | ICD-10-CM

## 2020-03-16 NOTE — Progress Notes (Signed)
Psychotherapy Progress Note [facility] [name], PhD LP  Patient ID: Deanna George     MRN: [id number] Therapy format: Individual psychotherapy Date: 03/16/2020      Start: 2:09p     Stop: 2:56p     Time Spent: 30 min (remainder donated) Location: In-person   Session narrative (presenting needs, interim history, self-report of stressors and symptoms, applications of prior therapy, status changes, and interventions made in session) Transitioned meds this weekend (see 4/27 med note).  Is using 2 x 0.5mg  clonazepam at bedtime, sometimes spacing 30 min aprt.  Stopped melatonin, for fear of overdosing and being down for a week.  Encouraged try again, subject to medical advice.  New text interaction with birth mother Vaughan Basta, awkward, upsetting.  Recounted story of getting projected on by stepmother Jeanine.  Processed meanings taken, attempted to decathect.  Agreed that when it comes to self-examination, perspective-taking for others, working to shoulder responsibility for others' feelings, and knowing when the time is ripe to offer forgiveness and consideration, Deanna George has had an exceptionally rigorous training program compared to just about anyone else she will ever encounter.  It is no surprise she would feel alone much of the time interacting with people who are or could be family.   Disability award should be activating before long, as continues to run down excess funds, and has already Chiropractor.  Continues to work on housing options.  Therapeutic modalities: Cognitive Behavioral Therapy, Solution-Oriented/Positive Psychology and Narrative  Mental Status/Observations:  Appearance:   Casual     Behavior:  tense  Motor:  Normal  Speech/Language:   Clear and Coherent  Affect:  Appropriate  Mood:  anxious and depressed  Thought process:  normal and some blocking  Thought content:    WNL and worry  Sensory/Perceptual disturbances:    WNL  Orientation:  Fully oriented   Attention:  Good    Concentration:  Fair  Memory:  grossly intact, retention of detail difficult with sleep affected  Insight:    Good  Judgment:   Fair  Impulse Control:  Fair   Risk Assessment: Danger to Self: persistent SI, pledge of safety Self-injurious Behavior: No Danger to Others: No Physical Aggression / Violence: No Duty to Warn: No Access to Firearms a concern: No  Assessment of progress:  stabilized  Diagnosis:   ICD-10-CM   1. PTSD (post-traumatic stress disorder)  F43.10   2. Psychophysiological insomnia  F51.04   3. Generalized anxiety disorder  F41.1   4. Severe episode of recurrent major depressive disorder, without psychotic features ([place])  F33.2   5. Attention deficit hyperactivity disorder (ADHD), unspecified ADHD type  F90.9   6. Learning disabilities  F81.9   7. Chronic fatigue syndrome with fibromyalgia  R53.82    M79.7   8. Unexplained falls  R29.6   9. r/o cervical nerve impingement AND mitochondiral disease  R69   10. 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 Migraine with aura and without status migrainosus, not intractable  G43.109    Plan:  . Probably try again with melatonin to better establish sleep-readiness.  Should not drop meds or change multiple meds on instinct, and the system for helping restore sleep has been easily upset several times already. Nyra Capes letting contact with birth mother ride for now.  Focus on  . Other recommendations/advice as may be noted above . Continue to utilize previously learned skills ad lib . Maintain medication as prescribed and work faithfully with relevant prescriber(s)  if any changes are desired or seem indicated . Call the clinic on-call service, present to ER, or call 911 if any life-threatening psychiatric crisis Return in about 2 weeks (around 03/30/2020). . Already scheduled visit in this office 03/23/2020.  [name], PhD [name], PhD LP Clinical Psychologist, [facility] [address] [place], [place] [address] [phone]

## 2020-03-23 ENCOUNTER — Other Ambulatory Visit: Payer: Self-pay

## 2020-03-23 ENCOUNTER — Ambulatory Visit (INDEPENDENT_AMBULATORY_CARE_PROVIDER_SITE_OTHER): Payer: Self-pay | Admitting: Psychiatry

## 2020-03-23 DIAGNOSIS — F4310 Post-traumatic stress disorder, unspecified: Secondary | ICD-10-CM

## 2020-03-23 DIAGNOSIS — R5382 Chronic fatigue, unspecified: Secondary | ICD-10-CM

## 2020-03-23 DIAGNOSIS — F331 Major depressive disorder, recurrent, moderate: Secondary | ICD-10-CM

## 2020-03-23 DIAGNOSIS — F411 Generalized anxiety disorder: Secondary | ICD-10-CM

## 2020-03-23 DIAGNOSIS — G9332 Myalgic encephalomyelitis/chronic fatigue syndrome: Secondary | ICD-10-CM

## 2020-03-23 DIAGNOSIS — Z599 Problem related to housing and economic circumstances, unspecified: Secondary | ICD-10-CM

## 2020-03-23 DIAGNOSIS — F5104 Psychophysiologic insomnia: Secondary | ICD-10-CM

## 2020-03-23 DIAGNOSIS — Z598 Other problems related to housing and economic circumstances: Secondary | ICD-10-CM

## 2020-03-23 DIAGNOSIS — M797 Fibromyalgia: Secondary | ICD-10-CM

## 2020-03-23 NOTE — Progress Notes (Signed)
Psychotherapy Progress Note [facility] [name], PhD LP  Patient ID: Deanna George     MRN: [id number] Therapy format: Individual psychotherapy Date: 03/23/2020      Start: 2:13p     Stop: 3:03p     Time Spent: 50 min Location: In-person   Session narrative (presenting needs, interim history, self-report of stressors and symptoms, applications of prior therapy, status changes, and interventions made in session) Sleep -- Didn't sleep last night, maybe 2 hrs total. Didn't sleep at all Friday night, crashed 1am Saturday, Sunday night murky, then last night.  Not taking Klonopin in the morning, OK to take evening.  Got to the point of tremors before catching up some sleep.  Frequent use of antiemetics and anti-acid meds, IBS still flares up and can wake her.  Unclear whether caffeine use interferes, but degraded diet, sleep, and polypharmacy probably combine with multiple anxiety and somatic issues to perpetuate the cycle of broken, distributed sleep.  Housing -- Excellent meeting with realtor, will get a break on commission, knows houses are going very fast, and her own floor plan in her own neighborhood sold for 80-100K above purchase price recently.  The math works out to settle her debts and give her $50K to buy a tiny house free and clear.  Plans to hire maid service and carpet cleaning soon to .  Has looked further into her friend Jaclene's offer in [place] and the tiny home there.  Has greenlighted [place] there to look into it and conduct a preliminary deal, only the seller wants to close by the 1st week of June.  May be some room for negotiation.  Will drive up to see firsthand and feel out arrangements for living there next week.  Further discussed readiness to uproot from here, including significant unknowns of activating SSI and establishing local medical care.  Noticed some attraction to, and possibly with, her realtor, but not entirely sure she can trust her senses about  that, after so much relational trauma.  Could be he was just that compassionate and she has been that lonely so long, but her feelings, anyway.    Finances -- Still in spend-down to activate Medicaid and SSI eligibility.  Roughly $8K to her name now, needs to $2K before can activate SSI (though she swears it's SSDI -- does not match ).  Does plan to continue using up for the purposes of real estate.    Therapeutic modalities: Cognitive Behavioral Therapy, Solution-Oriented/Positive Psychology and Ego-Supportive  Mental Status/Observations:  Appearance:   Casual     Behavior:  Appropriate  Motor:  Normal and no tremor noted despite sleep deprivation and caffeine  Speech/Language:   Clear and Coherent  Affect:  Appropriate  Mood:  worried, but less extreme  Thought process:  normal  Thought content:    WNL  Sensory/Perceptual disturbances:    WNL  Orientation:  Fully oriented  Attention:  Good    Concentration:  Good  Memory:  WNL  Insight:    Good  Judgment:   intact  Impulse Control:  Good   Risk Assessment: Danger to Self: No Self-injurious Behavior: No Danger to Others: No Physical Aggression / Violence: No Duty to Warn: No Access to Firearms a concern: No  Assessment of progress:  progressing  Diagnosis:   ICD-10-CM   1. Generalized anxiety disorder  F41.1   2. Psychophysiological insomnia  F51.04   3. Major depressive disorder, recurrent episode, moderate (HCC)  F33.1  4. PTSD (post-traumatic stress disorder)  F43.10   5. Chronic fatigue syndrome with fibromyalgia  R53.82    M79.7   6. Financial difficulties  Z59.8    Plan:  . Reinforce sleep regimen . OK to travel to [place], just make sure to learn how services, health care, and government assistance would work there and exactly what friend Bird could and could not be counted on for . Be very sure to notify [place] SSI when reaching threshold of need, to have establish earliest security and reduce worry . Other  recommendations/advice as may be noted above . Continue to utilize previously learned skills ad lib . Maintain medication as prescribed and work faithfully with relevant prescriber(s) if any changes are desired or seem indicated . Call the clinic on-call service, present to ER, or call 911 if any life-threatening psychiatric crisis Return in about 2 weeks (around 04/06/2020) for session(s) already scheduled. . Already scheduled visit in this office 04/06/2020.  [name], PhD [name], PhD LP Clinical Psychologist, [employer] [employer] [facility] [address], [place], [place] [address] [phone]

## 2020-03-30 ENCOUNTER — Ambulatory Visit: Payer: Medicaid Other | Admitting: Psychiatry

## 2020-03-30 DIAGNOSIS — Z0289 Encounter for other administrative examinations: Secondary | ICD-10-CM

## 2020-04-06 ENCOUNTER — Ambulatory Visit: Payer: Self-pay | Admitting: Psychiatry

## 2020-04-06 ENCOUNTER — Ambulatory Visit: Payer: Medicaid Other | Admitting: Adult Health

## 2020-04-13 ENCOUNTER — Other Ambulatory Visit: Payer: Self-pay

## 2020-04-13 ENCOUNTER — Ambulatory Visit (INDEPENDENT_AMBULATORY_CARE_PROVIDER_SITE_OTHER): Payer: Self-pay | Admitting: Psychiatry

## 2020-04-13 DIAGNOSIS — F4310 Post-traumatic stress disorder, unspecified: Secondary | ICD-10-CM

## 2020-04-13 DIAGNOSIS — R5382 Chronic fatigue, unspecified: Secondary | ICD-10-CM

## 2020-04-13 DIAGNOSIS — R296 Repeated falls: Secondary | ICD-10-CM

## 2020-04-13 DIAGNOSIS — F331 Major depressive disorder, recurrent, moderate: Secondary | ICD-10-CM

## 2020-04-13 DIAGNOSIS — M797 Fibromyalgia: Secondary | ICD-10-CM

## 2020-04-13 DIAGNOSIS — F5104 Psychophysiologic insomnia: Secondary | ICD-10-CM

## 2020-04-13 DIAGNOSIS — R69 Illness, unspecified: Secondary | ICD-10-CM

## 2020-04-13 DIAGNOSIS — F411 Generalized anxiety disorder: Secondary | ICD-10-CM

## 2020-04-13 DIAGNOSIS — Z598 Other problems related to housing and economic circumstances: Secondary | ICD-10-CM

## 2020-04-13 DIAGNOSIS — F819 Developmental disorder of scholastic skills, unspecified: Secondary | ICD-10-CM

## 2020-04-13 DIAGNOSIS — R251 Tremor, unspecified: Secondary | ICD-10-CM

## 2020-04-13 DIAGNOSIS — F909 Attention-deficit hyperactivity disorder, unspecified type: Secondary | ICD-10-CM

## 2020-04-13 DIAGNOSIS — Z599 Problem related to housing and economic circumstances, unspecified: Secondary | ICD-10-CM

## 2020-04-13 NOTE — Progress Notes (Signed)
Psychotherapy Progress Note [facility] [name], PhD LP  Patient ID: Deanna George     MRN: [id number] Therapy format: Individual psychotherapy Date: 04/13/2020      Start: 2:15p     Stop: 3:04p     Time Spent: 30 min (remainder donated) Location: In-person   Session narrative (presenting needs, interim history, self-report of stressors and symptoms, applications of prior therapy, status changes, and interventions made in session) Got house detailed, signed with realtor, worked into great fibromyalgia pain moving things back.  Made the trip to [place] (slept most of the way, daughter driving), forgetting her main pain medicine (duloxetine).  Saw the tiny home and a couple lots that would not work.  Daughter was irritable, understood to be with Pt for being so ill but also for being in the midst of high-pressure school schedule and unable to see her girlfriend.  House had many showings, and a couple of quick offers.  M[place]ie Billen has come down and helped clear out.  Have had many showings (40+) and several offers on the house beforehand.  A couple nights in hotel to accommodate.  C. 15 offers, accepted an offer c. 40K above expectations from an [place].  45-day closing requested, so Pt can settle other housing.  Realtor will help her find land nearby, and she will be looking at a mobile home for sale.  The tiny home in [place] turned out to be tinier and much more torn up than expected, and would have been considerable trouble to transport.  Very appreciative of friend Suzzette's help.  Some chance she may be moving here, given her own marriage breaking down.  PT acknowledges she typically gets "coma sleep" after several short nights, it's not every single night short.  Still worth working on sleep readiness regimen to include medication, sleep hygiene, and work on achieving a more restful mindset, but issues, intense worry, reactive neurophysiology, multiple medical problems, and  ongoing fear of family turmoil continue and continue to keep her keyed up round the clock.  Re. daughter, says she does have some concern that their relationship could be slipping, after a long time positive.  Reviewed how they dealt with each other traveling, involved some mutually passive aggressive moments and sometimes vicious mockery by daughter while away.  Disheartening, demoralizing triggers some thoughts of not wanting to live, not revealed.  Interpreted daughter picking fights, basically, either to deal with the possibility of Pt moving away, or to get herself out from under the feeling she has an incontrovertible duty to protect her but can't tell where the endpoint may be, or she is just at an age to individuate, especially as a lesbian coming out of a fairly conservative religious upbringing.  Advised to try to trust more that Summer is not working up to reject her, more one or more of these ideas, even while legitimizing the hurt.    Therapeutic modalities: Cognitive Behavioral Therapy, Solution-Oriented/Positive Psychology, Ego-Supportive and Narrative  Mental Status/Observations:  Appearance:   Casual     Behavior:  Appropriate  Motor:  Normal exc some tremor  Speech/Language:   Clear and Coherent  Affect:  Constricted  Mood:  anxious and depressed  Thought process:  normal  Thought content:    Obsessions  Sensory/Perceptual disturbances:    WNL  Orientation:  grossly intact  Attention:  Fair    Concentration:  Fair  Memory:  grossly intact  Insight:    Good  Judgment:  Fair  Impulse Control:  Fair   Risk Assessment: Danger to Self: persistent SI no plan/intent Self-injurious Behavior: No Danger to Others: No Physical Aggression / Violence: No Duty to Warn: No Access to Firearms a concern: No  Assessment of progress:  stabilized  Diagnosis:   ICD-10-CM   1. Generalized anxiety disorder  F41.1   2. Major depressive disorder, recurrent episode, moderate (HCC)  F33.1    3. Psychophysiological insomnia  F51.04   4. PTSD (post-traumatic stress disorder)  F43.10   5. Attention deficit hyperactivity disorder (ADHD), unspecified ADHD type  F90.9   6. Learning disabilities  F81.9   7. Chronic fatigue syndrome with fibromyalgia  R53.82    M79.7   8. Financial difficulties  Z59.8   9. r/o cervical nerve impingement AND mitochondiral disease  R69   10. 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 Unexplained falls  R29.6    Plan:  Continue to work through housing issues  . Verify disability criteria and timing, do not hesitate to notify [place] SSI when reaching.  Approved already, remains disabled from any occupation due to overall symptom severity, and relief from anxiety about financial security seems crucial for de-escalating long-conditioned ANS arousal enough to recover sleep, emotional resiliency, and better neurological regulation. . Continue working all prior recommendations for sleep readiness and reinforcement . Other recommendations/advice as may be noted above . Continue to utilize previously learned skills ad lib . Maintain medication as prescribed and work faithfully with relevant prescriber(s) if any changes are desired or seem indicated . Call the clinic on-call service, present to ER, or call 911 if any life-threatening psychiatric crisis Return in about 2 weeks (around 04/27/2020). . Already scheduled visit in this office 04/21/2020.  [name], PhD [name], PhD LP Clinical Psychologist, [facility] [address] [place], [place] [address] [phone]

## 2020-04-21 ENCOUNTER — Other Ambulatory Visit: Payer: Self-pay

## 2020-04-21 ENCOUNTER — Ambulatory Visit (INDEPENDENT_AMBULATORY_CARE_PROVIDER_SITE_OTHER): Payer: Self-pay | Admitting: Psychiatry

## 2020-04-21 DIAGNOSIS — F819 Developmental disorder of scholastic skills, unspecified: Secondary | ICD-10-CM

## 2020-04-21 DIAGNOSIS — R296 Repeated falls: Secondary | ICD-10-CM

## 2020-04-21 DIAGNOSIS — R5382 Chronic fatigue, unspecified: Secondary | ICD-10-CM

## 2020-04-21 DIAGNOSIS — Z599 Problem related to housing and economic circumstances, unspecified: Secondary | ICD-10-CM

## 2020-04-21 DIAGNOSIS — IMO0002 Reserved for concepts with insufficient information to code with codable children: Secondary | ICD-10-CM

## 2020-04-21 DIAGNOSIS — F4310 Post-traumatic stress disorder, unspecified: Secondary | ICD-10-CM

## 2020-04-21 DIAGNOSIS — R251 Tremor, unspecified: Secondary | ICD-10-CM

## 2020-04-21 DIAGNOSIS — Z598 Other problems related to housing and economic circumstances: Secondary | ICD-10-CM

## 2020-04-21 DIAGNOSIS — F411 Generalized anxiety disorder: Secondary | ICD-10-CM

## 2020-04-21 DIAGNOSIS — R69 Illness, unspecified: Secondary | ICD-10-CM

## 2020-04-21 DIAGNOSIS — M5412 Radiculopathy, cervical region: Secondary | ICD-10-CM

## 2020-04-21 DIAGNOSIS — F909 Attention-deficit hyperactivity disorder, unspecified type: Secondary | ICD-10-CM

## 2020-04-21 DIAGNOSIS — F5104 Psychophysiologic insomnia: Secondary | ICD-10-CM

## 2020-04-21 DIAGNOSIS — G9332 Myalgic encephalomyelitis/chronic fatigue syndrome: Secondary | ICD-10-CM

## 2020-04-21 DIAGNOSIS — M797 Fibromyalgia: Secondary | ICD-10-CM

## 2020-04-21 DIAGNOSIS — G43709 Chronic migraine without aura, not intractable, without status migrainosus: Secondary | ICD-10-CM

## 2020-04-21 DIAGNOSIS — F332 Major depressive disorder, recurrent severe without psychotic features: Secondary | ICD-10-CM

## 2020-04-21 NOTE — Progress Notes (Signed)
Psychotherapy Progress Note [facility] [name], PhD LP  Patient ID: Deanna George     MRN: [id number] Therapy format: Individual psychotherapy Date: 04/21/2020      Start: 3:21p     Stop: 4:10p     Time Spent: 30 min (remainder donated) Location: In-person   Session narrative (presenting needs, interim history, self-report of stressors and symptoms, applications of prior therapy, status changes, and interventions made in session) Glad to say she asked office mgr about her bill, and erroneous balance was rectified.  Affirmed acting assertively instead of catastrophizing and paralyzing about it.  Worked anxiously to clean house to be sure it passed inspection (obsessively, in fact), and it passed as expected on Monday. "Coma slept" yesterday from the anxious excitement of it all, and from gearing up to find an acceptable trailer situation.  Has a lead that looks affordable with [facility] in [place].  Rx problem -- out of tramadol, thinks she may need to talk to psychiatrist.  Discussed professional boundaries, recommended she address her primary care physician, who last wrote for it according to the chart, but certainly should inform psychiatrist, as this may be a prescription that could or should cross over.  Moving out of the house is very evocative.  This house has been her longest-rooted location in her life, over 52 moves by her count, and preparing to leave it feels like turning loose of gravity.  Been selling off things, where appropriate.  Not sure if either of the kids can be available to help with moving out, unfortunately.  Abundantly clear she cannot do significant lifting nor safely carry much for fear of triggering weakness, fibromyalgia, tremors, or what we have come to call "brownouts".  Has some options for asking friends.  Stress already manifest in dizzy spells, weakness, and seizure-like phenomena.  Cannot presently afford to assess further medically, but  retains hope of tertiary referral to an integrative medicine clinic like [place], based on several specialists' prior endorsement.  Discussed tremors, cramps, and unusual experiences of localized edema that seem to signal dysregulation in various parts of her nervous system.  Therapeutic modalities: Cognitive Behavioral Therapy, Solution-Oriented/Positive Psychology and Ego-Supportive  Mental Status/Observations:  Appearance:   Casual     Behavior:  Appropriate  Motor:  Normal  Speech/Language:   Clear and Coherent  Affect:  Appropriate  Mood:  anxious and depressed  Thought process:  normal  Thought content:    Obsessions  Sensory/Perceptual disturbances:    WNL  Orientation:  Fully oriented  Attention:  Fair    Concentration:  Fair  Memory:  grossly intact  Insight:    Fair  Judgment:   Fair  Impulse Control:  Fair   Risk Assessment: Danger to Self: No Self-injurious Behavior: No Danger to Others: No Physical Aggression / Violence: No Duty to Warn: No Access to Firearms a concern: No  Assessment of progress:  stabilized, guarded  Diagnosis:   ICD-10-CM   1. Generalized anxiety disorder  F41.1   2. Psychophysiological insomnia  F51.04   3. PTSD (post-traumatic stress disorder)  F43.10   4. Severe episode of recurrent major depressive disorder, without psychotic features ([place])  F33.2   5. Attention deficit hyperactivity disorder (ADHD), unspecified ADHD type  F90.9   6. Learning disabilities  F81.9   7. Chronic fatigue syndrome with fibromyalgia  R53.82    M79.7   8. Physiological tremor  R25.1   9. Unexplained falls  R29.6   10. Chronic  migraine  G43.709   11. Cervical radiculopathy, chronic  M54.12   12. r/o mitochondiral disease  R69   13. Financial difficulties  Z59.8    Plan:  . Continue working through housing issue and spend-down to disability Remains disabled from any occupation due to overall symptom severity Address tramadol Rx to prescribing physician,  inform psychiatrist as able . Keep working on sleep meds and sleep readiness measures as able, understanding that she is deeply entrenched in pattern of 21-23 hour days then "coma sleep" . Try to trim soda and further control smoking, if able . Other recommendations/advice as may be noted above . Continue to utilize previously learned skills ad lib . Maintain medication as prescribed and work faithfully with relevant prescriber(s) if any changes are desired or seem indicated . Call the clinic on-call service, present to ER, or call 911 if any life-threatening psychiatric crisis Return in about 1 week (around 04/28/2020). . Already scheduled visit in this office 04/27/2020.  [name], PhD [name], PhD LP Clinical Psychologist, [facility] [address] [place], [place] [address] [phone]

## 2020-04-27 ENCOUNTER — Other Ambulatory Visit: Payer: Self-pay

## 2020-04-27 ENCOUNTER — Ambulatory Visit (INDEPENDENT_AMBULATORY_CARE_PROVIDER_SITE_OTHER): Payer: Self-pay | Admitting: Psychiatry

## 2020-04-27 DIAGNOSIS — F4310 Post-traumatic stress disorder, unspecified: Secondary | ICD-10-CM

## 2020-04-27 DIAGNOSIS — M797 Fibromyalgia: Secondary | ICD-10-CM

## 2020-04-27 DIAGNOSIS — Z599 Problem related to housing and economic circumstances, unspecified: Secondary | ICD-10-CM

## 2020-04-27 DIAGNOSIS — R69 Illness, unspecified: Secondary | ICD-10-CM

## 2020-04-27 DIAGNOSIS — R5382 Chronic fatigue, unspecified: Secondary | ICD-10-CM

## 2020-04-27 DIAGNOSIS — G9332 Myalgic encephalomyelitis/chronic fatigue syndrome: Secondary | ICD-10-CM

## 2020-04-27 DIAGNOSIS — F331 Major depressive disorder, recurrent, moderate: Secondary | ICD-10-CM

## 2020-04-27 DIAGNOSIS — F411 Generalized anxiety disorder: Secondary | ICD-10-CM

## 2020-04-27 DIAGNOSIS — Z598 Other problems related to housing and economic circumstances: Secondary | ICD-10-CM

## 2020-04-27 DIAGNOSIS — F909 Attention-deficit hyperactivity disorder, unspecified type: Secondary | ICD-10-CM

## 2020-04-27 DIAGNOSIS — F5104 Psychophysiologic insomnia: Secondary | ICD-10-CM

## 2020-04-27 NOTE — Progress Notes (Signed)
Psychotherapy Progress Note [facility] [name], PhD LP  Patient ID: Deanna George     MRN: [id number] Therapy format: Individual psychotherapy Date: 04/27/2020      Start: 2:03p     Stop: 3:13p     Time Spent: 45 min (remainder donated) Location: In-person   Session narrative (presenting needs, interim history, self-report of stressors and symptoms, applications of prior therapy, status changes, and interventions made in session) Got a look at [facility] in [place], noticing short inventory due to lumber and steel delays.  Looking at an existing trailer, but inflation is pricing her out of land, rental is tight, and the beautiful model she's had her eye on is a demo that has had 100 offers already.  Working on getting something on 3-mo order.  May have to finance.  July 13 closing, asking about extension.  Has a POD coming, has been getting packing materials, all familiar form over 50 previous moves.  Will call case worker Thursday.  Currently about $3000 to her name before home sale.    Wishes she had someone to partner with walking through the changes.  Both kids are unavailable for moving, which is painful and lonely.  Hunter in particular keeps portraying a self-centered, you-call-me / you-can't-do-enough approach to relationship.  Convinced his father has thoroughly coopted him.  Offered some assurance that his brain has stored the love, support, and moral experience he has had with PT, and when adulthood sets in further and he has had enough experience, he could wake up and open up better than this, but PT feels convinced she will be dead before she sees that.  Acknowledged no guarantees, just hope that God has ways beyond the limits of what she can invite or control.  Meanwhile, she has bartered for neighbors' help and does have a few friends pitching in.  Wants further support and partnership if it exists.  Reminded of [employer] and provided  number again.  Re. severe fatigue and the likelihood that it is metabolic, reminded of MCT oil as a possible partial remedy to help mitochondria re-engage.  As she understands it, her fatigue syndrome very well could involve mitochondrial shutdowns and was triggered by C Diff infection and high fever, similar to the story told in the documentary "Unrest".    Therapeutic modalities: Cognitive Behavioral Therapy and Solution-Oriented/Positive Psychology  Mental Status/Observations:  Appearance:   Casual     Behavior:  Appropriate  Motor:  Normal  Speech/Language:   Clear and Coherent  Affect:  Appropriate  Mood:  anxious and depressed  Thought process:  normal  Thought content:    WNL and worries  Sensory/Perceptual disturbances:    WNL  Orientation:  Fully oriented  Attention:  Good    Concentration:  Good  Memory:  WNL  Insight:    Good  Judgment:   Good  Impulse Control:  Good   Risk Assessment: Danger to Self: No Self-injurious Behavior: No Danger to Others: No Physical Aggression / Violence: No Duty to Warn: No Access to Firearms a concern: No  Assessment of progress:  progressing  Diagnosis:   ICD-10-CM   1. Generalized anxiety disorder  F41.1   2. Major depressive disorder, recurrent episode, moderate (HCC)  F33.1   3. Chronic fatigue syndrome with fibromyalgia  R53.82    M79.7   4. Psychophysiological insomnia  F51.04   5. Financial difficulties  Z59.8   6. PTSD (post-traumatic stress disorder)  F43.10  7. Attention deficit hyperactivity disorder (ADHD), unspecified ADHD type  F90.9   8. r/o cervical nerve impingement AND mitochondrial disease  R69    Plan:  . Follow through seeking affordable home situation and consulting case worker this week . Press photographer for helpful perspective and resources  . Option try MCT oil experimentally as nutritional remedy for fatigue . Other recommendations/advice as may be noted above . Continue to utilize  previously learned skills ad lib . Maintain medication as prescribed and work faithfully with relevant prescriber(s) if any changes are desired or seem indicated . Call the clinic on-call service, present to ER, or call 911 if any life-threatening psychiatric crisis Return in about 1 week (around 05/04/2020). . Already scheduled visit in this office 05/04/2020.  [name], PhD [name], PhD LP Clinical Psychologist, [facility] [address], [place], [place] [address] [phone]

## 2020-05-04 ENCOUNTER — Ambulatory Visit: Payer: Self-pay | Admitting: Psychiatry

## 2020-05-11 ENCOUNTER — Ambulatory Visit (INDEPENDENT_AMBULATORY_CARE_PROVIDER_SITE_OTHER): Payer: Self-pay | Admitting: Psychiatry

## 2020-05-11 ENCOUNTER — Other Ambulatory Visit: Payer: Self-pay

## 2020-05-11 DIAGNOSIS — Z598 Other problems related to housing and economic circumstances: Secondary | ICD-10-CM

## 2020-05-11 DIAGNOSIS — M797 Fibromyalgia: Secondary | ICD-10-CM

## 2020-05-11 DIAGNOSIS — F331 Major depressive disorder, recurrent, moderate: Secondary | ICD-10-CM

## 2020-05-11 DIAGNOSIS — F4312 Post-traumatic stress disorder, chronic: Secondary | ICD-10-CM

## 2020-05-11 DIAGNOSIS — Z599 Problem related to housing and economic circumstances, unspecified: Secondary | ICD-10-CM

## 2020-05-11 DIAGNOSIS — F909 Attention-deficit hyperactivity disorder, unspecified type: Secondary | ICD-10-CM

## 2020-05-11 DIAGNOSIS — F411 Generalized anxiety disorder: Secondary | ICD-10-CM

## 2020-05-11 DIAGNOSIS — F5104 Psychophysiologic insomnia: Secondary | ICD-10-CM

## 2020-05-11 DIAGNOSIS — R5382 Chronic fatigue, unspecified: Secondary | ICD-10-CM

## 2020-05-11 DIAGNOSIS — G9332 Myalgic encephalomyelitis/chronic fatigue syndrome: Secondary | ICD-10-CM
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
## 2020-05-14 ENCOUNTER — Telehealth: Payer: Self-pay | Admitting: Psychiatry

## 2020-05-14 DIAGNOSIS — Z599 Problem related to housing and economic circumstances, unspecified: Secondary | ICD-10-CM

## 2020-05-14 DIAGNOSIS — F331 Major depressive disorder, recurrent, moderate: Secondary | ICD-10-CM

## 2020-05-14 DIAGNOSIS — F4312 Post-traumatic stress disorder, chronic: Secondary | ICD-10-CM
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
## 2020-05-15 ENCOUNTER — Telehealth: Payer: Self-pay | Admitting: Psychiatry

## 2020-05-15 NOTE — Telephone Encounter (Signed)
Received after hours call from pt. She reports that she is, "very depressed, filled with anxiety" in response to recent information that she has learned regarding her disability. She reports that she has PTSD. She reports h/o seizures/ "blackouts.""I need something to break my anxiety and sadness." She reports, "I want to vanish or disappear." She reports passive death wishes. "I'm not brave enough to slit my wrists." Denies suicidal intent or plan.  She reports that she has not seen psychiatric provider recently due to financial constraints. She reports that Klonopin was helpful initially. She reports that she takes Klonopin 0.5 mg two tabs at HS and does not take Klonopin during that day. She reports that she has #11 or #12 tabs remaining from last Klonopin fill on 04/21/20.  She reports that she has had interrupted sleep due to panic. Reports that she has been having night terrors and will bite her tongue and cheek in her sleep. Reports that she can go several days without being able to sleep despite feeling exhausted. She reports having a severe panic attack yesterday that was about 4-4.5 hours in duration. She reports that she is trying to eat and will then vomit. She reports that she is feeling very shaky.   Plan: Recommended increasing Klonopin to 0.5 mg 1 tab daily and 2 tabs at at bedtime.  Also recommended taking Seroquel 100 mg one half tab daily and 1-1/2 tabs at at bedtime.  Recommended taking one Klonopin 0.5 mg tab and Seroquel 100 mg 1/2 tablet at time of call to relieve acute anxiety and distress.  Advised patient to call back if acute anxiety and panic is not relieved with these changes.  Discussed that these increases could cause some sedation which may be helpful with relieving acute anxiety and distress.  Advised patient to call back with any worsening signs and symptoms or if she does not feel safe. Pt advised to go to ER if she is having any acute safety issues. Pt verbalizes  understanding.  Discussed that information would be relayed to both her therapist, [name], PhD, and her medication provider, Garwin Brothers, DNP. She has apts with both providers on 05/25/20.

## 2020-05-18 ENCOUNTER — Telehealth: Payer: Self-pay | Admitting: Psychiatry

## 2020-05-18 DIAGNOSIS — F4001 Agoraphobia with panic disorder: Secondary | ICD-10-CM

## 2020-05-18 DIAGNOSIS — Z599 Problem related to housing and economic circumstances, unspecified: Secondary | ICD-10-CM

## 2020-05-18 DIAGNOSIS — F4312 Post-traumatic stress disorder, chronic: Secondary | ICD-10-CM

## 2020-05-18 NOTE — Telephone Encounter (Signed)
Admin note for non-service contact  Patient ID: Deanna George  MRN: [id number] DATE: 05/18/2020  PT called earlier with contact info for new and former SSI agents: New agent in [place] office -- Katharine Look, fax [phone], [place].  Old agent in [facility] office -- Maryann Alar, [phone].  PT's message indicated wish to contact both n order to make sure her claim stands best chance of being expedited.   Reviewed presentation for [employer] case worker today, declined to "shotgun" contact both new and old case workers, recommended PT tell Katharine Look ([employer]) this afternoon her case was approved in another jurisdiction during pandemic, she was just told last week she has to start over in her own jurisdiction, living situation is about to become critical, and ask what is the best way to expedite her case (i.e., [employer] to pick up the [employer] or some order for [place] to resurrect her claim).  Will inform [place] after she knows.  She did accept med recommendation from 7/3 phone call, just did not take when she needed to drive or talk on phone.  Did not make her sleep (as feared), did take the edge off worry/panic.  Neighbor Vickie coming over for moral support for this afternoon's 1:30 phone call.  [name], PhD [name], PhD LP Clinical Psychologist, [facility] [address] [place], [place] [address] [phone]

## 2020-05-18 NOTE — Telephone Encounter (Signed)
Noted  

## 2020-05-24 ENCOUNTER — Other Ambulatory Visit: Payer: Self-pay

## 2020-05-24 ENCOUNTER — Ambulatory Visit (INDEPENDENT_AMBULATORY_CARE_PROVIDER_SITE_OTHER): Payer: Self-pay | Admitting: Adult Health

## 2020-05-24 ENCOUNTER — Encounter: Payer: Self-pay | Admitting: Adult Health

## 2020-05-24 ENCOUNTER — Ambulatory Visit (INDEPENDENT_AMBULATORY_CARE_PROVIDER_SITE_OTHER): Payer: Self-pay | Admitting: Psychiatry

## 2020-05-24 DIAGNOSIS — F4312 Post-traumatic stress disorder, chronic: Secondary | ICD-10-CM

## 2020-05-24 DIAGNOSIS — F331 Major depressive disorder, recurrent, moderate: Secondary | ICD-10-CM

## 2020-05-24 DIAGNOSIS — Z598 Other problems related to housing and economic circumstances: Secondary | ICD-10-CM

## 2020-05-24 DIAGNOSIS — F4001 Agoraphobia with panic disorder: Secondary | ICD-10-CM

## 2020-05-24 DIAGNOSIS — F411 Generalized anxiety disorder: Secondary | ICD-10-CM

## 2020-05-24 DIAGNOSIS — G4700 Insomnia, unspecified: Secondary | ICD-10-CM

## 2020-05-24 DIAGNOSIS — F909 Attention-deficit hyperactivity disorder, unspecified type: Secondary | ICD-10-CM

## 2020-05-24 DIAGNOSIS — R5382 Chronic fatigue, unspecified: Secondary | ICD-10-CM

## 2020-05-24 DIAGNOSIS — F5104 Psychophysiologic insomnia: Secondary | ICD-10-CM

## 2020-05-24 DIAGNOSIS — G9332 Myalgic encephalomyelitis/chronic fatigue syndrome: Secondary | ICD-10-CM

## 2020-05-24 DIAGNOSIS — Z599 Problem related to housing and economic circumstances, unspecified: Secondary | ICD-10-CM

## 2020-05-24 DIAGNOSIS — M797 Fibromyalgia: Secondary | ICD-10-CM

## 2020-05-24 MED ORDER — CLONAZEPAM 1 MG PO TABS: 1.0000 mg | ORAL_TABLET | Freq: Two times a day (BID) | ORAL | 2 refills | Status: DC

## 2020-05-24 NOTE — Progress Notes (Signed)
Deanna George [id number] 1966-08-31 54 y.o.  Subjective:   Patient ID:  Deanna George is a 54 y.o. (DOB 1966-09-25) female.  Chief Complaint: No chief complaint on file.   HPI Deanna George presents to the office today for follow-up of MDD, PTSD, GAD, panic disorder, insomnia.  Describes mood today as "not good". Pleasant. Mood symptoms - reports depression and anxiety. Increased sadness and depression over past week. Denies irritability. Increased panic attacks. Stating "my disability is on hold and I'm out of money". Was advised to spend down the money that she had and her benefits would be reinstated. Stating "I did what they asked, and now I don't have any money and the disability people have terminated my case". Has sold her house and will be closing soon. Packing up house. Pulled a muscle in her back packing boxes. Has been laying on a twin mattress for the past 3 days. Put a deposit on a trailer. Has found a piece of land. Will be staying at a friends cabin in [place] while waiting for benefits. Continues to work with Dr. Rica Mote. Varying interest and motivation. Taking medications as prescribed.  Energy levels low. Active, does not have a regular exercise routine. Disabled. Enjoys some usual interests and activities. Single. Lives alone with her cat and 2 parakeets. Talking to family and friends.  Appetite adequate. Weight gain - 216 pounds. Sleeps better some nights than others.Averages 5 to 6 hours. Focus and concentration difficulties - ADHD. Completing some tasks. Managing some aspects of household. Trying to finish packing to move.  Denies SI or HI. Denies AH or VH.  Previous medications: Ritalin and Thorazine - others, Trazadone    Review of Systems:  Review of Systems  Musculoskeletal: Negative for gait problem.  Neurological: Negative for tremors.  Psychiatric/Behavioral:       Please refer to HPI    Medications: I have reviewed the patient's current medications.  Current  Outpatient Medications  Medication Sig Dispense Refill  . albuterol (PROAIR HFA) 108 (90 Base) MCG/ACT inhaler 2 puffs as needed    . calcium carbonate (TUMS EX) 750 MG chewable tablet Chew 2 tablets by mouth 3 (three) times daily.    . Cholecalciferol (VITAMIN D3) 50 MCG (2000 UT) capsule 1 capsule    . clonazePAM (KLONOPIN) 1 MG tablet Take 1 tablet (1 mg total) by mouth 2 (two) times daily. 60 tablet 2  . cyclobenzaprine (FLEXERIL) 10 MG tablet TAKE 1 TABLET BY MOUTH EVERY 8 HOURS AS NEEDED FOR 30 DAYS    . dicyclomine (BENTYL) 10 MG capsule Take 1 capsule (10 mg total) by mouth 4 (four) times daily -  before meals and at bedtime. 120 capsule 5  . diphenhydrAMINE (BENADRYL) 25 mg capsule Take 25 mg by mouth every 6 (six) hours as needed for allergies.    . DULoxetine (CYMBALTA) 60 MG capsule Take 2 capsules (120 mg total) by mouth daily. 60 capsule 5  . ibuprofen (ADVIL) 800 MG tablet 1 tablet with food or milk as needed    . losartan (COZAAR) 50 MG tablet TAKE 1 TABLET BY MOUTH TWICE DAILY MAKE  A  OVERDUE  APPT  WITH  THE  DOCTOR  BEFORE  ANYMORE  REFILLS. 60 tablet 1  . Melatonin 10 MG CAPS Take 10 mg by mouth at bedtime as needed.     . metoprolol tartrate (LOPRESSOR) 25 MG tablet TAKE 1 TABLET BY MOUTH TWICE DAILY -  PLEASE  CALL  OFFICE  FOR  YEARLY  APPOINTMENT,  FOR  FURTHER  REFILLS  ([phone]) 60 tablet 1  . metoprolol tartrate (LOPRESSOR) 50 MG tablet Take 1 tablet (50 mg total) by mouth 2 (two) times daily. 180 tablet 3  . nortriptyline (PAMELOR) 25 MG capsule Take 2 capsules (50 mg total) by mouth at bedtime. 60 capsule 6  . omeprazole (PRILOSEC) 40 MG capsule Take 1 capsule (40 mg total) by mouth daily. (Patient taking differently: Take 40 mg by mouth in the morning and at bedtime. ) 30 capsule 5  . ondansetron (ZOFRAN) 4 MG tablet Take 1 tablet (4 mg total) by mouth every 6 (six) hours as needed for nausea or vomiting. 30 tablet 11  . prazosin (MINIPRESS) 2 MG capsule TAKE 1  CAPSULE (2 MG TOTAL) BY MOUTH AT BEDTIME. (Patient taking differently: Take 4 mg by mouth at bedtime. ) 90 capsule 1  . prazosin (MINIPRESS) 2 MG capsule Take two capsules at bedtime. 60 capsule 5  . Probiotic Product (PROBIOTIC-10 PO) Take by mouth daily.    . promethazine (PHENERGAN) 25 MG tablet Take 1 tablet (25 mg total) by mouth every 6 (six) hours as needed for nausea or vomiting. 30 tablet 11  . QUEtiapine (SEROQUEL) 100 MG tablet Take 1 tablet (100 mg total) by mouth at bedtime. 30 tablet 5  . rizatriptan (MAXALT-MLT) 10 MG disintegrating tablet Take 1 tablet (10 mg total) by mouth as needed. May repeat in 2 hours if needed 15 tablet 4  . SUMAtriptan (IMITREX) 50 MG tablet Take 50 mg by mouth every 2 (two) hours as needed for migraine. May repeat in 2 hours if headache persists or recurs.    . SUMAtriptan (IMITREX) 6 MG/0.5ML SOLN injection Inject 0.5 mLs (6 mg total) into the skin as needed for migraine. May repeat in 2 hours if headache persists or recurs. 10 vial 4  . traMADol (ULTRAM) 50 MG tablet TAKE ONE TABLET BY MOUTH AS NEEDED FOR HEADACHE 30 tablet 0   No current facility-administered medications for this visit.    Medication Side Effects: None  Allergies: No Known Allergies  Past Medical History:  Diagnosis Date  . Anal fissure   . Cervical disc disorder   . Clostridium difficile infection   . Dysrhythmia    tachycardia  . Esophageal stricture   . GERD (gastroesophageal reflux disease)   . Headache   . Hemorrhoids   . Hypertension   . IBS (irritable bowel syndrome)   . Interstitial cystitis   . Noncompliance    pt denies  . OA (osteoarthritis) of knee   . Palpitations   . Pneumonia   . 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 Situational stress   . Urinary tract infection     Family History  Adopted: Yes  Problem Relation Age of Onset  . Colon cancer Neg Hx   . Pancreatic cancer Neg Hx   . Stomach cancer Neg Hx   . Rectal cancer Neg Hx      Social History   Socioeconomic History  . Marital status: Legally Separated    Spouse name: Not on file  . Number of children: 2  . Years of education: Not on file  . Highest education level: Not on file  Occupational History  . Occupation: student    Comment: graduated 2018  Tobacco Use  . Smoking status: Current Some Day Smoker  . Smokeless tobacco: Never Used  Vaping Use  . Vaping Use: Never used  Substance and Sexual Activity  . Alcohol use: Not Currently    Comment: Rare  . Drug use: Not Currently  . Sexual activity: Never  Other Topics Concern  . Not on file  Social History Narrative  . Not on file   Social Determinants of Health   Financial Resource Strain:   . Difficulty of Paying Living Expenses:   Food Insecurity:   . Worried About Charity fundraiser in the Last Year:   . Arboriculturist in the Last Year:   Transportation Needs:   . Film/video editor (Medical):   Marland Kitchen Lack of Transportation (Non-Medical):   Physical Activity:   . Days of Exercise per Week:   . Minutes of Exercise per Session:   Stress:   . Feeling of Stress :   Social Connections:   . Frequency of Communication with Friends and Family:   . Frequency of Social Gatherings with Friends and Family:   . Attends Religious Services:   . Active Member of Clubs or Organizations:   . Attends Archivist Meetings:   Marland Kitchen Marital Status:   Intimate Partner Violence:   . Fear of Current or Ex-Partner:   . Emotionally Abused:   Marland Kitchen Physically Abused:   . Sexually Abused:     Past Medical History, Surgical history, Social history, and Family history were reviewed and updated as appropriate.   Please see review of systems for further details on the patient's review from today.   Objective:   Physical Exam:  There were no vitals taken for this visit.  Physical Exam Constitutional:      General: She is not in acute distress. Musculoskeletal:        General: No deformity.   Neurological:     Mental Status: She is alert and oriented to person, place, and time.     Coordination: Coordination normal.  Psychiatric:        Attention and Perception: Attention and perception normal. She does not perceive auditory or visual hallucinations.        Mood and Affect: Mood is anxious and depressed. Affect is not labile, blunt, angry or inappropriate.        Speech: Speech normal.        Behavior: Behavior normal.        Thought Content: Thought content normal. Thought content is not paranoid or delusional. Thought content does not include homicidal or suicidal ideation. Thought content does not include homicidal or suicidal plan.        Cognition and Memory: Cognition and memory normal.        Judgment: Judgment normal.     Comments: Insight intact     Lab Review:     Component Value Date/Time   NA 135 11/04/2019 1557   NA 140 03/27/2017 1543   K 3.9 11/04/2019 1557   CL 101 11/04/2019 1557   CO2 24 11/04/2019 1557   GLUCOSE 171 (H) 11/04/2019 1557   BUN 6 11/04/2019 1557   BUN 6 03/27/2017 1543   CREATININE 0.82 11/04/2019 1557   CREATININE 0.95 09/28/2017 1847   CALCIUM 9.4 11/04/2019 1557   PROT 7.0 11/04/2019 1557   PROT 7.2 03/27/2017 1543   ALBUMIN 4.3 11/04/2019 1557   ALBUMIN 4.5 03/27/2017 1543   AST 18 11/04/2019 1557   ALT 15 11/04/2019 1557   ALKPHOS 85 11/04/2019 1557   BILITOT 0.4 11/04/2019 1557   BILITOT 0.2 03/27/2017 1543   GFRNONAA 70 09/28/2017 1847  GFRAA 82 09/28/2017 1847       Component Value Date/Time   WBC 7.4 11/04/2019 1557   RBC 4.42 11/04/2019 1557   HGB 12.1 11/04/2019 1557   HGB 12.5 03/27/2017 1543   HCT 36.7 11/04/2019 1557   HCT 39.0 03/27/2017 1543   PLT 196.0 11/04/2019 1557   PLT 248 03/27/2017 1543   MCV 83.2 11/04/2019 1557   MCV 87 03/27/2017 1543   MCH 28.0 03/27/2017 1543   MCH 28.5 06/12/2016 1526   MCHC 33.0 11/04/2019 1557   RDW 13.9 11/04/2019 1557   RDW 13.6 03/27/2017 1543   LYMPHSABS 1.6  11/04/2019 1557   LYMPHSABS 1.7 03/27/2017 1543   MONOABS 0.4 11/04/2019 1557   EOSABS 0.1 11/04/2019 1557   EOSABS 0.1 03/27/2017 1543   BASOSABS 0.1 11/04/2019 1557   BASOSABS 0.0 03/27/2017 1543    No results found for: POCLITH, LITHIUM   No results found for: PHENYTOIN, PHENOBARB, VALPROATE, CBMZ   .res Assessment: Plan:    Continue:  Cymbalta 120mg  daily Prazosin 2mg  - 2 at hs - nightmares Seroquel 50mg  to 100mg  at hs for sleep Increase Clonazepam 0.5mg  daily to 1mg  BID  Continue therapy with [name]  RTC 3 months  Patient advised to contact office with any questions, adverse effects, or acute worsening in signs and symptoms.  Discussed potential benefits, risk, and side effects of benzodiazepines to include potential risk of tolerance and dependence, as well as possible drowsiness.  Advised patient not to drive if experiencing drowsiness and to take lowest possible effective dose to minimize risk of dependence and tolerance.  Discussed potential benefits, risks, and side effects of stimulants with patient to include increased heart rate, palpitations, insomnia, increased anxiety, increased irritability, or decreased appetite.  Instructed patient to contact office if experiencing any significant tolerability issues.  Diagnoses and all orders for this visit:  Chronic post-traumatic stress disorder (PTSD)  Insomnia, unspecified type -     clonazePAM (KLONOPIN) 1 MG tablet; Take 1 tablet (1 mg total) by mouth 2 (two) times daily.  Panic disorder with agoraphobia -     clonazePAM (KLONOPIN) 1 MG tablet; Take 1 tablet (1 mg total) by mouth 2 (two) times daily.  Generalized anxiety disorder -     clonazePAM (KLONOPIN) 1 MG tablet; Take 1 tablet (1 mg total) by mouth 2 (two) times daily.  Major depressive disorder, recurrent episode, moderate ([place])     Please see After Visit Summary for patient specific instructions.  No future appointments.  No orders of the  defined types were placed in this encounter.   -------------------------------
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
## 2020-06-09 ENCOUNTER — Telehealth: Payer: Self-pay | Admitting: Psychiatry

## 2020-06-09 DIAGNOSIS — F4310 Post-traumatic stress disorder, unspecified: Secondary | ICD-10-CM

## 2020-06-09 DIAGNOSIS — F4001 Agoraphobia with panic disorder: Secondary | ICD-10-CM

## 2020-06-09 DIAGNOSIS — Z599 Problem related to housing and economic circumstances, unspecified: Secondary | ICD-10-CM
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
## 2020-06-09 NOTE — Telephone Encounter (Signed)
Attempted to call 4:50pm, no answer, left message will try again after 7pm.  - AM
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
## 2020-06-14 ENCOUNTER — Telehealth: Payer: Self-pay | Admitting: Psychiatry

## 2020-06-14 NOTE — Telephone Encounter (Signed)
Received after hours call from pt who reports that she was told today that the banks are unable to find the funds from then sale of her home. She reports that she was planning on using the proceeds to buy a trailer. She reports that she has had multiple setbacks, negative events, and losses and is having difficulty coping. Denies SI. She reports that she has been having difficulty sleeping and some nights is unable to fall asleep until 4-5 am. Discussed that [place] noted on last visit that pt could take Quetiapine 50-100 mg po QHS. Pt reports that she has been taking 50 mg QHS and has not tried taking 50 mg po QHS. She reports that she is taking Klonopin 1 mg po QHS most nights and typically not taking it during the daytime. Discussed taking Quetiapine 100 mg po QHS and Klonopin 1 mg po QHS tonight and that she could take Klonopin prn again tomorrow if anxiety remains elevated. Discussed that information would be relayed to [name], PhD and [employer], DNP. Pt requesting a call from [name], PhD and reports that he had planned on attempting to reach her tomorrow.

## 2020-06-15 NOTE — Telephone Encounter (Signed)
Noted  

## 2020-06-28 ENCOUNTER — Other Ambulatory Visit: Payer: Self-pay | Admitting: Nurse Practitioner

## 2020-06-28 ENCOUNTER — Other Ambulatory Visit: Payer: Self-pay | Admitting: *Deleted

## 2020-06-28 ENCOUNTER — Telehealth: Payer: Self-pay | Admitting: *Deleted

## 2020-06-28 MED ORDER — LOSARTAN POTASSIUM 50 MG PO TABS: 50.0000 mg | ORAL_TABLET | Freq: Two times a day (BID) | ORAL | 1 refills | Status: DC

## 2020-06-28 NOTE — Telephone Encounter (Signed)
S/w pt stated will give pt # 60 X 1 for Losartan ( 50 mg ) by mouth bid per Cecille Rubin.  Pt is now in [place] for 1 month but will be moving back to [place]. Pt will have a VT visit with Cecille Rubin in Oct.     Patient Consent for Virtual Visit         Deanna George has provided verbal consent on 06/28/2020 for a virtual visit (video or telephone).   CONSENT FOR VIRTUAL VISIT FOR:  Deanna George  By participating in this virtual visit I agree to the following:  I hereby voluntarily request, consent and authorize [facility] and its employed or contracted physicians, physician assistants, nurse practitioners or other licensed health care professionals (the Practitioner), to provide me with telemedicine health care services (the Services") as deemed necessary by the treating Practitioner. I acknowledge and consent to receive the Services by the Practitioner via telemedicine. I understand that the telemedicine visit will involve communicating with the Practitioner through live audiovisual communication technology and the disclosure of certain medical information by electronic transmission. I acknowledge that I have been given the opportunity to request an in-person assessment or other available alternative prior to the telemedicine visit and am voluntarily participating in the telemedicine visit.  I understand that I have the right to withhold or withdraw my consent to the use of telemedicine in the course of my care at any time, without affecting my right to future care or treatment, and that the Practitioner or I may terminate the telemedicine visit at any time. I understand that I have the right to inspect all information obtained and/or recorded in the course of the telemedicine visit and may receive copies of available information for a reasonable fee.  I understand that some of the potential risks of receiving the Services via telemedicine include:   Delay or interruption in medical evaluation due to  technological equipment failure or disruption;  Information transmitted may not be sufficient (e.g. poor resolution of images) to allow for appropriate medical decision making by the Practitioner; and/or   In rare instances, security protocols could fail, causing a breach of personal health information.  Furthermore, I acknowledge that it is my responsibility to provide information about my medical history, conditions and care that is complete and accurate to the best of my ability. I acknowledge that Practitioner's advice, recommendations, and/or decision may be based on factors not within their control, such as incomplete or inaccurate data provided by me or distortions of diagnostic images or specimens that may result from electronic transmissions. I understand that the practice of medicine is not an exact science and that Practitioner makes no warranties or guarantees regarding treatment outcomes. I acknowledge that a copy of this consent can be made available to me via my patient portal ([facility]), or I can request a printed copy by calling the office of [facility].    I understand that my insurance will be billed for this visit.   I have read or had this consent read to me.  I understand the contents of this consent, which adequately explains the benefits and risks of the Services being provided via telemedicine.   I have been provided ample opportunity to ask questions regarding this consent and the Services and have had my questions answered to my satisfaction.  I give my informed consent for the services to be provided through the use of telemedicine in my medical care

## 2020-07-01 ENCOUNTER — Telehealth: Payer: Self-pay | Admitting: Psychiatry

## 2020-07-01 NOTE — Telephone Encounter (Signed)
Admin note for non-service contact  Patient ID: Deanna George  MRN: [id number] DATE: 07/01/2020  Calling to inquire whether [place] got reply from her Education officer, museum (3 weeks at this point since inquiry).  Pt has felt too paralyzed to make a followup call, afraid of receiving bad news.  No reply received by either of [place], strongly encouraged PT to make her own f/u call despite all feelings -- may require "squeaky wheel" approach, worth the exercise self-advocating, may have reassuring news, and it's better to know than to live with horrifying imagination.  Agreed, will call, enocuraged share tomorrow if word.  Did get through a financial snafu with funds missing that were earmarked for her mobile home purchase, right now can't afford to commit to a lot until she knows SSI will come through.  Will advise if anything she turns up seems to legitimately need further action by [place].  [name], PhD [name], PhD LP Clinical Psychologist, [facility] [address] [place], [place] [address] [phone]

## 2020-07-09 ENCOUNTER — Other Ambulatory Visit: Payer: Self-pay | Admitting: Nurse Practitioner

## 2020-07-09 NOTE — Telephone Encounter (Signed)
*  STAT* If patient is at the pharmacy, call can be transferred to refill team.   1. Which medications need to be refilled? (please list name of each medication and dose if known) metoprolol tartrate (LOPRESSOR) 25 MG tablet  2. Which pharmacy/location (including street and city if local pharmacy) is medication to be sent to? [place], [place]  3. Do they need a 30 day or 90 day supply? 90 day  Patient is out of medication

## 2020-07-21 ENCOUNTER — Other Ambulatory Visit: Payer: Self-pay | Admitting: *Deleted

## 2020-07-21 ENCOUNTER — Telehealth: Payer: Self-pay | Admitting: Adult Health

## 2020-07-21 MED ORDER — METOPROLOL TARTRATE 25 MG PO TABS: ORAL_TABLET | ORAL | 1 refills | Status: DC

## 2020-07-21 NOTE — Telephone Encounter (Signed)
Pt's medication was sent to pt's pharmacy as requested. Confirmation received.  °

## 2020-07-21 NOTE — Telephone Encounter (Signed)
Follow up:      Patient calling stating that she need her medication sent to [facility] [phone]. Store number [phone] main store number.

## 2020-07-21 NOTE — Telephone Encounter (Signed)
Yes

## 2020-07-21 NOTE — Telephone Encounter (Signed)
Okay to pend for you to send?

## 2020-07-21 NOTE — Telephone Encounter (Signed)
Pt called and said that she is in [place] and she needs a refill of her klonopin to be sent to the [facility] [address] [place], [place] [phone]

## 2020-07-22 ENCOUNTER — Other Ambulatory Visit: Payer: Self-pay

## 2020-07-22 DIAGNOSIS — F4001 Agoraphobia with panic disorder: Secondary | ICD-10-CM

## 2020-07-22 DIAGNOSIS — F411 Generalized anxiety disorder: Secondary | ICD-10-CM

## 2020-07-22 DIAGNOSIS — G4700 Insomnia, unspecified: Secondary | ICD-10-CM

## 2020-07-22 MED ORDER — CLONAZEPAM 1 MG PO TABS: 1.0000 mg | ORAL_TABLET | Freq: Two times a day (BID) | ORAL | 0 refills | Status: DC

## 2020-07-22 NOTE — Telephone Encounter (Signed)
Pended for [facility] to send

## 2020-08-02 ENCOUNTER — Telehealth: Payer: Self-pay | Admitting: Psychiatry

## 2020-08-02 NOTE — Telephone Encounter (Signed)
Did you get the paperwork that [facility] overnighted to you? She sent it to be delivered 9/17. Please call her with ?S and there is a timeline.

## 2020-08-02 NOTE — Telephone Encounter (Signed)
Just appeared at my office door today.  Will check with her after my 4pm.

## 2020-08-19 ENCOUNTER — Telehealth: Payer: Self-pay | Admitting: Physician Assistant

## 2020-08-19 MED ORDER — OMEPRAZOLE 40 MG PO CPDR: 40.0000 mg | DELAYED_RELEASE_CAPSULE | Freq: Two times a day (BID) | ORAL | 5 refills | Status: DC

## 2020-08-19 NOTE — Telephone Encounter (Signed)
Sent refill for Omeprazole to pharmacy in [place].

## 2020-08-19 NOTE — Telephone Encounter (Signed)
Patient requesting refill on Omeprazole also stated she sold her home and is currently staying with a friend in [place]. Please send to [facility] [phone]

## 2020-08-23 NOTE — Progress Notes (Signed)
Telehealth Visit    Evaluation Performed:  Follow-up visit   The patient was identified using 2 identifiers.   This visit type was conducted due to national recommendations for restrictions regarding the COVID-19 Pandemic (e.g. social distancing).  This format is felt to be most appropriate for this patient at this time.  All issues noted in this document were discussed and addressed.  No physical exam was performed (except for noted visual exam findings with Video Visits).  Please refer to the patient's chart (MyChart message for video visits and phone note for telephone visits) for the patient's consent to telehealth for [facility].  Date:  08/31/2020   ID:  Deanna George, DOB 06/12/1966, MRN [id number]  Patient Location:    Provider location:   [facility] Office  PCP:  [name], MD  Cardiologist:  Servando Snare & No primary care provider on file.  Electrophysiologist:  None   Chief Complaint:  Follow up  History of Present Illness:    Deanna George is a 54 y.o. female who presents via audio/video conferencing for a telehealth visit today.  Seen for Dr. Percival Spanish. Former patient of Dr. Susa Simmonds.   She has HTN with mild LVH onpastecho. Other issues as noted below.  I have seen her sporadically over the past several years.  She has lots of complex issues - difficult history for me to follow.   She has had some type of neuro/psyche disorder - she had been referred to a movement disorder in [place] but had trouble getting in. Seeing lots of specialists. Lots of somatic complaints. She visibly shakes - to the point she says she is injured. Past event monitor showed a short run of WCT - I had wanted to get an echo - she did not return our calls.   I have not seen here since 2019. Remained very hard to follow her history and she is quite desperate for "help".   Not seen today - patient has changed her OV with me.    Past Medical History:  Diagnosis Date  . Anal fissure   .  Cervical disc disorder   . Clostridium difficile infection   . Dysrhythmia    tachycardia  . Esophageal stricture   . GERD (gastroesophageal reflux disease)   . Headache   . Hemorrhoids   . Hypertension   . IBS (irritable bowel syndrome)   . Interstitial cystitis   . Noncompliance    pt denies  . OA (osteoarthritis) of knee   . Palpitations   . Pneumonia   . 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 Situational stress   . Urinary tract infection    Past Surgical History:  Procedure Laterality Date  . BILATERAL OOPHORECTOMY  01/2017   Laparoscopic at [facility] minimally invasive surgery department  . BREAST EXCISIONAL BIOPSY Right 1995  . CESAREAN SECTION  1998,2000   x2  . CHOLECYSTECTOMY N/A 01/28/2013   Procedure: LAPAROSCOPIC CHOLECYSTECTOMY WITH INTRAOPERATIVE CHOLANGIOGRAM;  Surgeon: Earnstine Regal, MD;  Location: [facility];  Service: General;  Laterality: N/A;  . COLONOSCOPY    . Foot pin post fracture  09/2008   Left foot x 2  . KNEE ARTHROSCOPY  2011    right  . LAPAROSCOPIC BILATERAL SALPINGECTOMY Bilateral 02/02/2015   Procedure: BILATERAL SALPINGECTOMY, ;  Surgeon: Anastasio Auerbach, MD;  Location: [facility];  Service: Gynecology;  Laterality: Bilateral;  . Laparoscopic surgery  01/2007   uterus and ovary x 2  .  LAPAROSCOPIC VAGINAL HYSTERECTOMY     02/2008  . LAPAROSCOPY N/A 02/02/2015   Procedure: LAPAROSCOPY DIAGNOSTIC, FULGERATION ENDOMETREOSIS, EXCISION RIGHT OVARIAN CYST, LYSIS OF ADHESIONS, EXCISION VULVAR AND VAGINAL CYST ;  Surgeon: Anastasio Auerbach, MD;  Location: [facility];  Service: Gynecology;  Laterality: N/A;  . MOLE REMOVAL Right 2010  . US ECHOCARDIOGRAPHY  07/31/2008   EF 55-60% with mild LVH     No outpatient medications have been marked as taking for the 08/31/20 encounter (Telemedicine) with Burtis Junes, NP.     Allergies:   Patient has no known allergies.   Social History   Tobacco Use  . Smoking status: Current Some Day Smoker  .  Smokeless tobacco: Never Used  Vaping Use  . Vaping Use: Never used  Substance Use Topics  . Alcohol use: Not Currently    Comment: Rare  . Drug use: Not Currently     Family Hx: The patient's family history is negative for Colon cancer, Pancreatic cancer, Stomach cancer, and Rectal cancer. She was adopted.  ROS:   Please see the history of present illness.     Objective:    Vital Signs:  There were no vitals taken for this visit.   Wt Readings from Last 3 Encounters:  01/16/20 216 lb 12.8 oz (98.3 kg)  10/01/18 212 lb (96.2 kg)  05/28/18 215 lb 6.4 oz (97.7 kg)      Labs/Other Tests and Data Reviewed:    Lab Results  Component Value Date   WBC 7.4 11/04/2019   HGB 12.1 11/04/2019   HCT 36.7 11/04/2019   PLT 196.0 11/04/2019   GLUCOSE 171 (H) 11/04/2019   CHOL 173 08/18/2014   TRIG 81 08/18/2014   HDL 47 08/18/2014   LDLCALC 110 (H) 08/18/2014   ALT 15 11/04/2019   AST 18 11/04/2019   NA 135 11/04/2019   K 3.9 11/04/2019   CL 101 11/04/2019   CREATININE 0.82 11/04/2019   BUN 6 11/04/2019   CO2 24 11/04/2019   TSH 0.75 11/04/2019   INR 1.01 01/24/2013     BNP (last 3 results) No results for input(s): BNP in the last 8760 hours.  ProBNP (last 3 results) No results for input(s): PROBNP in the last 8760 hours.    Prior CV studies:    The following studies were reviewed today:  Event Monitor Study Highlights 01/2018  NSR Sinus tachycardia Rare PACs One episode of wide complex tachycardia. SVT with aberrancy vs NSVT Of note the patient did not feel this event.  She did press the button for NSR or at times with PACs   Notes recorded by Burtis Junes, NP on 03/19/2018 at 1:04 PM EDT Ok to report. The monitor shows normal sinus rhythm as well as some tachycardia.  She did have some PACs - "extra beats".  One run of a wider complex tachycardia - noted that she was asymptomatic with this.  Lopressor already increased.  Would like to get her echo  updated to make sure that her pumping function is still ok.  No other changes for now.    Echo Study Conclusions from 2012  - Left ventricle: The cavity size was normal. Wall thickness was increased in a pattern of mild LVH. Systolic function was normal. The estimated ejection fraction was in the range of 55% to 65%. Wall motion was normal; there were no regional wall motion abnormalities. Left ventricular diastolic function parameters were normal. - Mitral valve: Mild regurgitation. - Atrial  septum: No defect or patent foramen ovale was identified.     ASSESSMENT & PLAN:    1. HTN -  2. Palpitations/tachycardia -  3.Tremor/weakness/uncoordination/fatigue/movement disorder -     Patient Risk:   After full review of this patient's clinical status, I feel that they are at least moderate risk at this time.  Time:   Today, I have spent 0 minutes with the patient with telehealth technology discussing the above issues.     Medication Adjustments/Labs and Tests Ordered: Current medicines are reviewed at length with the patient today.  Concerns regarding medicines are outlined above.   Tests Ordered: No orders of the defined types were placed in this encounter.   Medication Changes: No orders of the defined types were placed in this encounter.   Disposition:  Appointment was rescheduled by the patient.   Patient is agreeable to this plan and will call if any problems develop in the interim.   Amie Critchley, NP  08/31/2020 7:38 AM    [place]

## 2020-08-31 ENCOUNTER — Encounter: Payer: Self-pay | Admitting: Nurse Practitioner

## 2020-08-31 ENCOUNTER — Telehealth (INDEPENDENT_AMBULATORY_CARE_PROVIDER_SITE_OTHER): Payer: Self-pay | Admitting: Nurse Practitioner

## 2020-08-31 ENCOUNTER — Other Ambulatory Visit: Payer: Self-pay

## 2020-08-31 DIAGNOSIS — I10 Essential (primary) hypertension: Secondary | ICD-10-CM

## 2020-08-31 DIAGNOSIS — R002 Palpitations: Secondary | ICD-10-CM

## 2020-08-31 DIAGNOSIS — R000 Tachycardia, unspecified: Secondary | ICD-10-CM

## 2020-08-31 NOTE — Patient Instructions (Signed)
After Visit Summary:  We will be checking the following labs today -    Medication Instructions:    Continue with your current medicines.    If you need a refill on your cardiac medications before your next appointment, please call your pharmacy.     Testing/Procedures To Be Arranged:  N/A  Follow-Up:   See     At [facility], you and your health needs are our priority.  As part of our continuing mission to provide you with exceptional heart care, we have created designated Provider Care Teams.  These Care Teams include your primary Cardiologist (physician) and Advanced Practice Providers (APPs -  Physician Assistants and Nurse Practitioners) who all work together to provide you with the care you need, when you need it.  Special Instructions:  . Stay safe, wash your hands for at least 20 seconds and wear a mask when needed.  . It was good to talk with you today.    Call the [facility] office at [phone] if you have any questions, problems or concerns.
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
## 2020-09-20 ENCOUNTER — Other Ambulatory Visit: Payer: Self-pay

## 2020-09-20 ENCOUNTER — Telehealth (INDEPENDENT_AMBULATORY_CARE_PROVIDER_SITE_OTHER): Payer: Medicaid Other | Admitting: Nurse Practitioner

## 2020-09-20 ENCOUNTER — Telehealth: Payer: Self-pay | Admitting: *Deleted

## 2020-09-20 ENCOUNTER — Encounter: Payer: Self-pay | Admitting: Nurse Practitioner

## 2020-09-20 VITALS — Ht 64.5 in | Wt 204.0 lb

## 2020-09-20 DIAGNOSIS — I10 Essential (primary) hypertension: Secondary | ICD-10-CM

## 2020-09-20 DIAGNOSIS — G259 Extrapyramidal and movement disorder, unspecified: Secondary | ICD-10-CM

## 2020-09-20 DIAGNOSIS — R002 Palpitations: Secondary | ICD-10-CM

## 2020-09-20 DIAGNOSIS — F4310 Post-traumatic stress disorder, unspecified: Secondary | ICD-10-CM | POA: Diagnosis not present

## 2020-09-20 MED ORDER — METOPROLOL TARTRATE 25 MG PO TABS: ORAL_TABLET | ORAL | 11 refills | Status: AC

## 2020-09-20 MED ORDER — LOSARTAN POTASSIUM 50 MG PO TABS: 50.0000 mg | ORAL_TABLET | Freq: Two times a day (BID) | ORAL | 11 refills | Status: DC
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
## 2020-09-20 NOTE — Telephone Encounter (Signed)
°  Patient Consent for Virtual Visit         Deanna George has provided verbal consent on 09/20/2020 for a virtual visit (video or telephone).   CONSENT FOR VIRTUAL VISIT FOR:  Deanna George  By participating in this virtual visit I agree to the following:  I hereby voluntarily request, consent and authorize [place] and its employed or contracted physicians, physician assistants, nurse practitioners or other licensed health care professionals (the Practitioner), to provide me with telemedicine health care services (the Services") as deemed necessary by the treating Practitioner. I acknowledge and consent to receive the Services by the Practitioner via telemedicine. I understand that the telemedicine visit will involve communicating with the Practitioner through live audiovisual communication technology and the disclosure of certain medical information by electronic transmission. I acknowledge that I have been given the opportunity to request an in-person assessment or other available alternative prior to the telemedicine visit and am voluntarily participating in the telemedicine visit.  I understand that I have the right to withhold or withdraw my consent to the use of telemedicine in the course of my care at any time, without affecting my right to future care or treatment, and that the Practitioner or I may terminate the telemedicine visit at any time. I understand that I have the right to inspect all information obtained and/or recorded in the course of the telemedicine visit and may receive copies of available information for a reasonable fee.  I understand that some of the potential risks of receiving the Services via telemedicine include:   Delay or interruption in medical evaluation due to technological equipment failure or disruption;  Information transmitted may not be sufficient (e.g. poor resolution of images) to allow for appropriate medical decision making by the Practitioner;  and/or   In rare instances, security protocols could fail, causing a breach of personal health information.  Furthermore, I acknowledge that it is my responsibility to provide information about my medical history, conditions and care that is complete and accurate to the best of my ability. I acknowledge that Practitioner's advice, recommendations, and/or decision may be based on factors not within their control, such as incomplete or inaccurate data provided by me or distortions of diagnostic images or specimens that may result from electronic transmissions. I understand that the practice of medicine is not an exact science and that Practitioner makes no warranties or guarantees regarding treatment outcomes. I acknowledge that a copy of this consent can be made available to me via my patient portal ([place]), or I can request a printed copy by calling the office of [facility].    I understand that my insurance will be billed for this visit.   I have read or had this consent read to me.  I understand the contents of this consent, which adequately explains the benefits and risks of the Services being provided via telemedicine.   I have been provided ample opportunity to ask questions regarding this consent and the Services and have had my questions answered to my satisfaction.  I give my informed consent for the services to be provided through the use of telemedicine in my medical care

## 2020-09-20 NOTE — Patient Instructions (Addendum)
After Visit Summary:  We will be checking the following labs today - NONE  Try to see Deanna George soon for labs.    Medication Instructions:    Continue with your current medicines. I did send in your refills today.    If you need a refill on your cardiac medications before your next appointment, please call your pharmacy.     Testing/Procedures To Be Arranged:  N/A  Follow-Up:   See Dr. Johney Frame in one year - You will receive a reminder letter in the mail two months in advance. If you don't receive a letter, please call our office to schedule the follow-up appointment.     At [facility], you and your health needs are our priority.  As part of our continuing mission to provide you with exceptional heart care, we have created designated Provider Care Teams.  These Care Teams include your primary Cardiologist (physician) and Advanced Practice Providers (APPs -  Physician Assistants and Nurse Practitioners) who all work together to provide you with the care you need, when you need it.  Special Instructions:  . Stay safe, wash your hands for at least 20 seconds and wear a mask when needed.  . It was good to talk with you today.    Call the [place] office at [phone] if you have any questions, problems or concerns.

## 2020-09-29 ENCOUNTER — Inpatient Hospital Stay
Admission: EM | Admit: 2020-09-29 | Discharge: 2020-10-03 | DRG: 392 | Disposition: A | Discharge status: Home / Self Care | Payer: Medicaid Other | Attending: Internal Medicine | Admitting: Internal Medicine

## 2020-09-29 ENCOUNTER — Emergency Department: Payer: Medicaid Other

## 2020-09-29 ENCOUNTER — Encounter: Payer: Self-pay | Admitting: Radiology

## 2020-09-29 ENCOUNTER — Other Ambulatory Visit: Payer: Self-pay

## 2020-09-29 DIAGNOSIS — K529 Noninfective gastroenteritis and colitis, unspecified: Secondary | ICD-10-CM

## 2020-09-29 DIAGNOSIS — K219 Gastro-esophageal reflux disease without esophagitis: Secondary | ICD-10-CM | POA: Diagnosis present

## 2020-09-29 DIAGNOSIS — E860 Dehydration: Secondary | ICD-10-CM | POA: Diagnosis present

## 2020-09-29 DIAGNOSIS — E872 Acidosis: Secondary | ICD-10-CM | POA: Diagnosis present

## 2020-09-29 DIAGNOSIS — R739 Hyperglycemia, unspecified: Secondary | ICD-10-CM

## 2020-09-29 DIAGNOSIS — F419 Anxiety disorder, unspecified: Secondary | ICD-10-CM | POA: Diagnosis present

## 2020-09-29 DIAGNOSIS — N179 Acute kidney failure, unspecified: Secondary | ICD-10-CM | POA: Diagnosis not present

## 2020-09-29 DIAGNOSIS — Z20822 Contact with and (suspected) exposure to covid-19: Secondary | ICD-10-CM | POA: Diagnosis present

## 2020-09-29 DIAGNOSIS — Z79899 Other long term (current) drug therapy: Secondary | ICD-10-CM

## 2020-09-29 DIAGNOSIS — G43909 Migraine, unspecified, not intractable, without status migrainosus: Secondary | ICD-10-CM | POA: Diagnosis present

## 2020-09-29 DIAGNOSIS — N131 Hydronephrosis with ureteral stricture, not elsewhere classified: Secondary | ICD-10-CM | POA: Diagnosis present

## 2020-09-29 DIAGNOSIS — E1165 Type 2 diabetes mellitus with hyperglycemia: Secondary | ICD-10-CM | POA: Diagnosis present

## 2020-09-29 DIAGNOSIS — R55 Syncope and collapse: Secondary | ICD-10-CM | POA: Diagnosis not present

## 2020-09-29 DIAGNOSIS — A084 Viral intestinal infection, unspecified: Principal | ICD-10-CM | POA: Diagnosis present

## 2020-09-29 DIAGNOSIS — R197 Diarrhea, unspecified: Secondary | ICD-10-CM

## 2020-09-29 DIAGNOSIS — I10 Essential (primary) hypertension: Secondary | ICD-10-CM | POA: Diagnosis not present

## 2020-09-29 DIAGNOSIS — F17200 Nicotine dependence, unspecified, uncomplicated: Secondary | ICD-10-CM | POA: Diagnosis present

## 2020-09-29 DIAGNOSIS — F32A Depression, unspecified: Secondary | ICD-10-CM | POA: Diagnosis present

## 2020-09-29 DIAGNOSIS — R112 Nausea with vomiting, unspecified: Secondary | ICD-10-CM

## 2020-09-29 LAB — CBC
HCT: 40.1 % (ref 36.0–46.0)
Hemoglobin: 13.1 g/dL (ref 12.0–15.0)
MCH: 26.8 pg (ref 26.0–34.0)
MCHC: 32.7 g/dL (ref 30.0–36.0)
MCV: 82 fL (ref 80.0–100.0)
Platelets: 299 10*3/uL (ref 150–400)
RBC: 4.89 MIL/uL (ref 3.87–5.11)
RDW: 13.7 % (ref 11.5–15.5)
WBC: 13.5 10*3/uL — ABNORMAL HIGH (ref 4.0–10.5)
nRBC: 0 % (ref 0.0–0.2)

## 2020-09-29 LAB — GASTROINTESTINAL PANEL BY PCR, STOOL (REPLACES STOOL CULTURE)

## 2020-09-29 LAB — COMPREHENSIVE METABOLIC PANEL
ALT: 10 U/L (ref 0–44)
AST: 14 U/L — ABNORMAL LOW (ref 15–41)
Albumin: 4.2 g/dL (ref 3.5–5.0)
Alkaline Phosphatase: 107 U/L (ref 38–126)
Anion gap: 14 (ref 5–15)
BUN: 16 mg/dL (ref 6–20)
CO2: 18 mmol/L — ABNORMAL LOW (ref 22–32)
Calcium: 9.3 mg/dL (ref 8.9–10.3)
Chloride: 105 mmol/L (ref 98–111)
Creatinine, Ser: 1.21 mg/dL — ABNORMAL HIGH (ref 0.44–1.00)
GFR, Estimated: 54 mL/min — ABNORMAL LOW (ref >60)
Glucose, Bld: 243 mg/dL — ABNORMAL HIGH (ref 70–99)
Potassium: 3.5 mmol/L (ref 3.5–5.1)
Sodium: 137 mmol/L (ref 135–145)
Total Bilirubin: 0.7 mg/dL (ref 0.3–1.2)
Total Protein: 7.7 g/dL (ref 6.5–8.1)

## 2020-09-29 LAB — LIPASE, BLOOD: Lipase: 23 U/L (ref 11–51)

## 2020-09-29 LAB — RESPIRATORY PANEL BY RT PCR (FLU A&B, COVID)
Influenza A by PCR: NEGATIVE
Influenza B by PCR: NEGATIVE
SARS Coronavirus 2 by RT PCR: NEGATIVE

## 2020-09-29 LAB — C DIFFICILE QUICK SCREEN W PCR REFLEX
C Diff antigen: NEGATIVE
C Diff interpretation: NOT DETECTED
C Diff toxin: NEGATIVE

## 2020-09-29 LAB — TROPONIN I (HIGH SENSITIVITY)
Troponin I (High Sensitivity): 6 ng/L (ref <18)
Troponin I (High Sensitivity): 10 ng/L (ref <18)

## 2020-09-29 LAB — URINALYSIS, COMPLETE (UACMP) WITH MICROSCOPIC
Bilirubin Urine: NEGATIVE
Glucose, UA: 50 mg/dL — AB
Ketones, ur: 20 mg/dL — AB
Leukocytes,Ua: NEGATIVE
Nitrite: NEGATIVE
Protein, ur: 100 mg/dL — AB
Specific Gravity, Urine: 1.032 — ABNORMAL HIGH (ref 1.005–1.030)
pH: 5 (ref 5.0–8.0)

## 2020-09-29 LAB — CBG MONITORING, ED: Glucose-Capillary: 231 mg/dL — ABNORMAL HIGH (ref 70–99)

## 2020-09-29 LAB — MAGNESIUM: Magnesium: 2.1 mg/dL (ref 1.7–2.4)

## 2020-09-29 MED ORDER — ONDANSETRON HCL 4 MG/2ML IJ SOLN
INTRAMUSCULAR | Status: AC
  Administered 2020-09-29: 4 mg via INTRAVENOUS
  Filled 2020-09-29: qty 2

## 2020-09-29 MED ORDER — HYDROMORPHONE HCL 1 MG/ML IJ SOLN
0.5000 mg | Freq: Once | INTRAMUSCULAR | Status: AC
  Administered 2020-09-29: 0.5 mg via INTRAVENOUS
  Filled 2020-09-29: qty 1

## 2020-09-29 MED ORDER — DICYCLOMINE HCL 10 MG PO CAPS
10.0000 mg | ORAL_CAPSULE | Freq: Once | ORAL | Status: AC
  Administered 2020-09-29: 10 mg via ORAL
  Filled 2020-09-29: qty 1

## 2020-09-29 MED ORDER — LACTATED RINGERS IV BOLUS
1000.0000 mL | Freq: Once | INTRAVENOUS | Status: AC
  Administered 2020-09-29: 1000 mL via INTRAVENOUS

## 2020-09-29 MED ORDER — ONDANSETRON HCL 4 MG/2ML IJ SOLN: 4.0000 mg | Freq: Once | INTRAMUSCULAR | Status: AC

## 2020-09-29 MED ORDER — IOHEXOL 300 MG/ML  SOLN
100.0000 mL | Freq: Once | INTRAMUSCULAR | Status: AC | PRN
  Administered 2020-09-29: 100 mL via INTRAVENOUS

## 2020-09-29 MED ORDER — ONDANSETRON HCL 4 MG/2ML IJ SOLN
4.0000 mg | Freq: Once | INTRAMUSCULAR | Status: AC | PRN
  Administered 2020-09-29: 4 mg via INTRAVENOUS
  Filled 2020-09-29: qty 2

## 2020-09-29 NOTE — ED Notes (Signed)
Pt transported to Ct.

## 2020-09-29 NOTE — ED Notes (Addendum)
Pt transported to xray 

## 2020-09-29 NOTE — ED Provider Notes (Signed)
[facility] Emergency Department Provider Note  ____________________________________________   First MD Initiated Contact with Patient 09/29/20 1920     (approximate)  I have reviewed the triage vital signs and the nursing notes.   HISTORY  Chief Complaint Nausea and Emesis   HPI Deanna George is a 54 y.o. female with a past medical history of C. difficile, GERD, IBS, hemorrhoids, interstitial cystitis, and palpitations with recurrent syncopal episodes presents for assessment of nonbloody nonbilious vomiting and nonbloody diarrhea associate with some crampy abdominal pain and poor p.o. intake as well as several syncopal episodes that began yesterday.  Patient states she is prone to syncopal episodes and think she has passed out 3 or 4 times yesterday but denies any falls or hitting her head.  States she has not passed out today but feels like she might as she is think she is very dehydrated and has had her baby to eat or drink today.  He states last time she felt anything like this was when she had C. difficile.  She denies any recent antibiotic exposure, recent travel outside [place], or anyone else in her household with similar symptoms.  She states she feels a little bit short of breath with no cough, chest pain, fevers, back pain, rash, urinary symptoms, or other acute complaints.  Denies EtOH use or illicit drug use.         Past Medical History:  Diagnosis Date  . Anal fissure   . Cervical disc disorder   . Clostridium difficile infection   . Dysrhythmia    tachycardia  . Esophageal stricture   . GERD (gastroesophageal reflux disease)   . Headache   . Hemorrhoids   . Hypertension   . IBS (irritable bowel syndrome)   . Interstitial cystitis   . Noncompliance    pt denies  . OA (osteoarthritis) of knee   . Palpitations   . Pneumonia   . 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 Situational stress   . Urinary tract infection      Patient Active Problem List   Diagnosis Date Noted  . Gastroenteritis 09/29/2020  . Tremor 03/27/2017  . Numbness and tingling 02/23/2017  . 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 Pelvic pain in female 12/11/2016  . Postoperative pain 12/11/2016  . Dizziness 08/23/2016  . Sensorineural hearing loss (SNHL), bilateral 08/23/2016  . Tympanic membrane perforation, marginal, right 08/23/2016  . Personal history of colonic polyps 01/24/2013  . Allergic contact dermatitis 05/28/2012  . Learning difficulty 05/28/2012  . Chronic migraine 11/24/2011  . Cervical radiculopathy, chronic 11/24/2011  . HTN (hypertension) 09/05/2011  . Chronic interstitial cystitis 03/01/2011  . Neck pain 03/16/2010  . Benign essential hypertension 03/16/2010  . Diaphragmatic hernia 03/16/2010  . Osteoarthrosis involving lower leg 03/16/2010  . Stiffness of joint, not elsewhere classified, other specified site 03/16/2010  . CARCINOMA, SKIN, SQUAMOUS CELL 02/17/2009  . BLOOD IN STOOL 02/17/2009  . Right lower quadrant pain 02/17/2009  . GERD 12/10/2008  . OTHER DYSPHAGIA 12/10/2008    Past Surgical History:  Procedure Laterality Date  . BILATERAL OOPHORECTOMY  01/2017   Laparoscopic at [facility] minimally invasive surgery department  . BREAST EXCISIONAL BIOPSY Right 1995  . CESAREAN SECTION  1998,2000   x2  . CHOLECYSTECTOMY N/A 01/28/2013   Procedure: LAPAROSCOPIC CHOLECYSTECTOMY WITH INTRAOPERATIVE CHOLANGIOGRAM;  Surgeon: Earnstine Regal, MD;  Location: [facility] ORS;  Service: General;  Laterality: N/A;  . COLONOSCOPY    .  Foot pin post fracture  09/2008   Left foot x 2  . KNEE ARTHROSCOPY  2011    right  . LAPAROSCOPIC BILATERAL SALPINGECTOMY Bilateral 02/02/2015   Procedure: BILATERAL SALPINGECTOMY, ;  Surgeon: Anastasio Auerbach, MD;  Location: [facility];  Service: Gynecology;  Laterality: Bilateral;  . Laparoscopic surgery  01/2007   uterus and ovary x 2  . LAPAROSCOPIC VAGINAL HYSTERECTOMY     02/2008  .  LAPAROSCOPY N/A 02/02/2015   Procedure: LAPAROSCOPY DIAGNOSTIC, FULGERATION ENDOMETREOSIS, EXCISION RIGHT OVARIAN CYST, LYSIS OF ADHESIONS, EXCISION VULVAR AND VAGINAL CYST ;  Surgeon: Anastasio Auerbach, MD;  Location: [facility];  Service: Gynecology;  Laterality: N/A;  . MOLE REMOVAL Right 2010  . US ECHOCARDIOGRAPHY  07/31/2008   EF 55-60% with mild LVH    Prior to Admission medications   Medication Sig Start Date End Date Taking? Authorizing Provider  albuterol (PROAIR HFA) 108 (90 Base) MCG/ACT inhaler 2 puffs as needed    [provider]  calcium carbonate (TUMS EX) 750 MG chewable tablet Chew 2 tablets by mouth 3 (three) times daily.    [provider]  Cholecalciferol (VITAMIN D3) 50 MCG (2000 UT) capsule 1 capsule    [provider]  clonazePAM (KLONOPIN) 1 MG tablet Take 1 tablet (1 mg total) by mouth 2 (two) times daily. 07/22/20   Mozingo, Berdie Ogren, NP  cyclobenzaprine (FLEXERIL) 10 MG tablet TAKE 1 TABLET BY MOUTH EVERY 8 HOURS AS NEEDED FOR 30 DAYS 07/03/19   [provider]  diazepam (VALIUM) 5 MG tablet Take 5 mg by mouth every 6 (six) hours as needed for anxiety.    [provider]  dicyclomine (BENTYL) 10 MG capsule Take 1 capsule (10 mg total) by mouth 4 (four) times daily -  before meals and at bedtime. 01/16/20   Levin Erp, PA  diphenhydrAMINE (BENADRYL) 25 mg capsule Take 25 mg by mouth every 6 (six) hours as needed for allergies.    [provider]  DULoxetine (CYMBALTA) 60 MG capsule Take 2 capsules (120 mg total) by mouth daily. 03/09/20   Mozingo, Berdie Ogren, NP  ibuprofen (ADVIL) 800 MG tablet 1 tablet with food or milk as needed    [provider]  losartan (COZAAR) 50 MG tablet Take 1 tablet (50 mg total) by mouth in the morning and at bedtime. 09/20/20   Burtis Junes, NP  Melatonin 10 MG CAPS Take 10 mg by mouth at bedtime as needed.     [provider]  metoprolol tartrate  (LOPRESSOR) 25 MG tablet TAKE 1 TABLET BY MOUTH TWICE DAILY. 09/20/20   Burtis Junes, NP  omeprazole (PRILOSEC) 40 MG capsule Take 1 capsule (40 mg total) by mouth in the morning and at bedtime. 08/19/20   Levin Erp, PA  ondansetron (ZOFRAN) 4 MG tablet Take 1 tablet (4 mg total) by mouth every 6 (six) hours as needed for nausea or vomiting. 01/16/20   Levin Erp, PA  prazosin (MINIPRESS) 2 MG capsule TAKE 1 CAPSULE (2 MG TOTAL) BY MOUTH AT BEDTIME. Patient taking differently: Take 4 mg by mouth at bedtime.  02/10/20   Mozingo, Berdie Ogren, NP  prazosin (MINIPRESS) 2 MG capsule Take two capsules at bedtime. 02/24/20   Mozingo, Berdie Ogren, NP  Probiotic Product (PROBIOTIC-10 PO) Take by mouth daily.    [provider]  promethazine (PHENERGAN) 25 MG tablet Take 1 tablet (25 mg total) by mouth every 6 (six) hours  as needed for nausea or vomiting. 01/16/20   Levin Erp, PA  QUEtiapine (SEROQUEL) 100 MG tablet Take 1 tablet (100 mg total) by mouth at bedtime. 03/09/20   Mozingo, Berdie Ogren, NP  rizatriptan (MAXALT-MLT) 10 MG disintegrating tablet Take 1 tablet (10 mg total) by mouth as needed. May repeat in 2 hours if needed 03/06/19   Suzzanne Cloud, NP  SUMAtriptan (IMITREX) 50 MG tablet Take 50 mg by mouth every 2 (two) hours as needed for migraine. May repeat in 2 hours if headache persists or recurs.    [provider]  SUMAtriptan (IMITREX) 6 MG/0.5ML SOLN injection Inject 0.5 mLs (6 mg total) into the skin as needed for migraine. May repeat in 2 hours if headache persists or recurs. 02/13/18   Dennie Bible, NP    Allergies Patient has no known allergies.  Family History  Adopted: Yes  Problem Relation Age of Onset  . Colon cancer Neg Hx   . Pancreatic cancer Neg Hx   . Stomach cancer Neg Hx   . Rectal cancer Neg Hx     Social History Social History   Tobacco Use  . Smoking status: Current Some Day Smoker  .  Smokeless tobacco: Never Used  Vaping Use  . Vaping Use: Never used  Substance Use Topics  . Alcohol use: Not Currently    Comment: Rare  . Drug use: Not Currently    Review of Systems  Review of Systems  Constitutional: Positive for malaise/fatigue. Negative for chills and fever.  HENT: Negative for sore throat.   Eyes: Negative for pain.  Respiratory: Positive for shortness of breath. Negative for cough and stridor.   Cardiovascular: Negative for chest pain.  Gastrointestinal: Positive for abdominal pain, diarrhea, nausea and vomiting.  Genitourinary: Negative for dysuria.  Musculoskeletal: Negative for myalgias.  Skin: Negative for rash.  Neurological: Positive for dizziness and loss of consciousness. Negative for seizures and headaches.  Psychiatric/Behavioral: Negative for suicidal ideas.  All other systems reviewed and are negative.     ____________________________________________   PHYSICAL EXAM:  VITAL SIGNS: ED Triage Vitals  Enc Vitals Group     BP 09/29/20 1645 (!) 169/111     Pulse Rate 09/29/20 1645 (!) 120     Resp 09/29/20 1645 (!) 22     Temp 09/29/20 1645 98.1 F (36.7 C)     Temp Source 09/29/20 1645 Oral     SpO2 09/29/20 1645 97 %     Weight 09/29/20 1646 210 lb (95.3 kg)     Height 09/29/20 1646 5' 4.5" (1.638 m)     Head Circumference --      Peak Flow --      Pain Score 09/29/20 1645 10     Pain Loc --      Pain Edu? --      Excl. in [place]? --    Vitals:   09/29/20 2200 09/29/20 2300  BP: (!) 163/94 (!) 157/92  Pulse: (!) 101 86  Resp: 20 15  Temp:    SpO2: 94% 94%   Physical Exam Vitals and nursing note reviewed.  Constitutional:      General: She is not in acute distress.    Appearance: She is well-developed.  HENT:     Head: Normocephalic and atraumatic.     Right Ear: External ear normal.     Left Ear: External ear normal.  Eyes:     Conjunctiva/sclera: Conjunctivae normal.  Cardiovascular:  Rate and Rhythm: Regular  rhythm. Tachycardia present.     Heart sounds: No murmur heard.   Pulmonary:     Effort: Pulmonary effort is normal. No respiratory distress.     Breath sounds: Normal breath sounds.  Abdominal:     Palpations: Abdomen is soft.     Tenderness: There is no abdominal tenderness. There is no right CVA tenderness or left CVA tenderness.  Musculoskeletal:     Cervical back: Neck supple.  Skin:    General: Skin is warm and dry.     Capillary Refill: Capillary refill takes more than 3 seconds.  Neurological:     Mental Status: She is alert and oriented to person, place, and time.  Psychiatric:        Mood and Affect: Mood normal.      ____________________________________________   LABS (all labs ordered are listed, but only abnormal results are displayed)  Labs Reviewed  COMPREHENSIVE METABOLIC PANEL - Abnormal; Notable for the following components:      Result Value   CO2 18 (*)    Glucose, Bld 243 (*)    Creatinine, Ser 1.21 (*)    AST 14 (*)    GFR, Estimated 54 (*)    All other components within normal limits  CBC - Abnormal; Notable for the following components:   WBC 13.5 (*)    All other components within normal limits  URINALYSIS, COMPLETE (UACMP) WITH MICROSCOPIC - Abnormal; Notable for the following components:   Color, Urine AMBER (*)    APPearance TURBID (*)    Specific Gravity, Urine 1.032 (*)    Glucose, UA 50 (*)    Hgb urine dipstick SMALL (*)    Ketones, ur 20 (*)    Protein, ur 100 (*)    Bacteria, UA FEW (*)    All other components within normal limits  CBG MONITORING, ED - Abnormal; Notable for the following components:   Glucose-Capillary 231 (*)    All other components within normal limits  GASTROINTESTINAL PANEL BY PCR, STOOL (REPLACES STOOL CULTURE)  C DIFFICILE QUICK SCREEN W PCR REFLEX  RESPIRATORY PANEL BY RT PCR (FLU A&B, COVID)  LIPASE, BLOOD  MAGNESIUM  HEMOGLOBIN A1C  HIV ANTIBODY (ROUTINE TESTING W REFLEX)  CBC  CREATININE, SERUM   BASIC METABOLIC PANEL  CBC  TROPONIN I (HIGH SENSITIVITY)  TROPONIN I (HIGH SENSITIVITY)   ____________________________________________  EKG  Sinus tachycardia with non-specific ST changes throughout.  ____________________________________________  RADIOLOGY  ED MD interpretation:  CXR unremarkable.   Official radiology report(s): DG Chest 2 View  Result Date: 09/29/2020 CLINICAL DATA:  Shortness of breath EXAM: CHEST - 2 VIEW COMPARISON:  09/28/2017 FINDINGS: The heart size and mediastinal contours are within normal limits. Both lungs are clear. The visualized skeletal structures are unremarkable. IMPRESSION: Normal study. Electronically Signed   By: Rolm Baptise M.D.   On: 09/29/2020 19:53   CT ABDOMEN PELVIS W CONTRAST  Result Date: 09/29/2020 CLINICAL DATA:  54 year old female with abdominal pain. EXAM: CT ABDOMEN AND PELVIS WITH CONTRAST TECHNIQUE: Multidetector CT imaging of the abdomen and pelvis was performed using the standard protocol following bolus administration of intravenous contrast. CONTRAST:  153mL OMNIPAQUE IOHEXOL 300 MG/ML  SOLN COMPARISON:  CT abdomen pelvis dated 02/11/2015. FINDINGS: Lower chest: The visualized lung bases are clear. No intra-abdominal free air. Trace free fluid in the pelvis. Hepatobiliary: Probable mild fatty liver. No intrahepatic biliary dilatation. Cholecystectomy. No retained calcified stone noted in the central CBD. Pancreas: Unremarkable.  No pancreatic ductal dilatation or surrounding inflammatory changes. Spleen: Normal in size without focal abnormality. Adrenals/Urinary Tract: The adrenal glands unremarkable. The left kidney is unremarkable. There is severe right hydronephrosis with severe right renal parenchyma atrophy, likely secondary to longstanding hydronephrosis. There is a transition at the right ureteropelvic junction. No calcified stone identified. Findings concerning for a stricture at the right UPJ. A urothelial lesion or neoplasm  is not excluded. Clinical correlation and further evaluation with direct visualization recommended. The urinary bladder is collapsed. Stomach/Bowel: There is long segment inflammatory changes and thickening of the distal and terminal ileum consistent with enteritis. Findings may be infectious in etiology, although an inflammatory bowel disease is not excluded clinical correlation is recommended. There is no bowel obstruction. The appendix is normal. Vascular/Lymphatic: The abdominal aorta and IVC unremarkable. No portal venous gas. There is no adenopathy. Reproductive: Hysterectomy. No adnexal masses. Other: None Musculoskeletal: No acute or significant osseous findings. Right L5 pars defect. No listhesis. IMPRESSION: 1. Enteritis of the distal small bowel. No bowel obstruction. Normal appendix. 2. Severe right hydronephrosis concerning for a stricture at the right UPJ. A urothelial lesion is not excluded. Clinical correlation and further evaluation with direct visualization recommended. Electronically Signed   By: Anner Crete M.D.   On: 09/29/2020 20:37    ____________________________________________   PROCEDURES  Procedure(s) performed (including Critical Care):  .1-3 Lead EKG Interpretation Performed by: Lucrezia Starch, MD Authorized by: Lucrezia Starch, MD     Interpretation: abnormal     ECG rate assessment: normal     Rhythm: sinus tachycardia     Ectopy: none     Conduction: normal       ____________________________________________   INITIAL IMPRESSION / ASSESSMENT AND PLAN / ED COURSE        Patient presents with above history and exam for assessment of N/V/D and abdominal pain that began yesterday. On arrival, patient is tachycardic with otherwise stable VS. On exam patient does appear dehydrated.   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 No evidence of diverticulitis, appendicitis, SBO or other acute  intra-abdominal process. CMP with mild acidosis w/ CO2 18, hyperglycemia at 243 and AKI w/ Cr 1.2 compared to 0.82 74mo ago. No other significant derangements. AG 14. Not consistent with DKA. CBC with mild leukocytosis w/ WBC count 13.5. Otherwise unremarkable.   Patient still dehydrated but on re-assessments endorses continued lightheadedness. Will admit to medicine for hydration and nausea control.      ____________________________________________   FINAL CLINICAL IMPRESSION(S) / ED DIAGNOSES  Final diagnoses:  Nausea vomiting and diarrhea  Dehydration  Hyperglycemia    Medications  rizatriptan (MAXALT-MLT) disintegrating tablet 10 mg (has no administration in time range)  SUMAtriptan (IMITREX) tablet 50 mg (has no administration in time range)  SUMAtriptan (IMITREX) injection 6 mg (has no administration in time range)  metoprolol tartrate (LOPRESSOR) tablet 25 mg (has no administration in time range)  prazosin (MINIPRESS) capsule 4 mg (has no administration in time range)  prazosin (MINIPRESS) capsule 2 mg (has no administration in time range)  diazepam (VALIUM) tablet 5 mg (has no administration in time range)  DULoxetine (CYMBALTA) DR capsule 120 mg (has no administration in time range)  QUEtiapine (SEROQUEL) tablet 100 mg (has no administration in time range)  calcium carbonate (TUMS EX) chewable tablet 1,500 mg (has no administration in time range)  dicyclomine (BENTYL) capsule 10 mg (has no administration in time range)  pantoprazole (  PROTONIX) EC tablet 40 mg (has no administration in time range)  ondansetron (ZOFRAN) tablet 4 mg (has no administration in time range)  Probiotic-10 CHEW (has no administration in time range)  Melatonin CAPS 10 mg (has no administration in time range)  clonazePAM (KLONOPIN) tablet 1 mg (has no administration in time range)  cyclobenzaprine (FLEXERIL) tablet 10 mg (has no administration in time range)  albuterol (VENTOLIN HFA) 108 (90 Base)  MCG/ACT inhaler 2 puff (has no administration in time range)  diphenhydrAMINE (BENADRYL) capsule 25 mg (has no administration in time range)  promethazine (PHENERGAN) tablet 25 mg (has no administration in time range)  enoxaparin (LOVENOX) injection 40 mg (has no administration in time range)  0.9 %  sodium chloride infusion (has no administration in time range)  acetaminophen (TYLENOL) tablet 650 mg (has no administration in time range)    Or  acetaminophen (TYLENOL) suppository 650 mg (has no administration in time range)  traZODone (DESYREL) tablet 25 mg (has no administration in time range)  ondansetron (ZOFRAN) tablet 4 mg (has no administration in time range)    Or  ondansetron (ZOFRAN) injection 4 mg (has no administration in time range)  potassium chloride (KLOR-CON) packet 40 mEq (has no administration in time range)  morphine 2 MG/ML injection 2 mg (has no administration in time range)  ondansetron (ZOFRAN) injection 4 mg (4 mg Intravenous Given 09/29/20 1650)  lactated ringers bolus 1,000 mL (0 mLs Intravenous Stopped 09/29/20 2127)  HYDROmorphone (DILAUDID) injection 0.5 mg (0.5 mg Intravenous Given 09/29/20 2011)  lactated ringers bolus 1,000 mL (0 mLs Intravenous Stopped 09/29/20 2237)  iohexol (OMNIPAQUE) 300 MG/ML solution 100 mL (100 mLs Intravenous Contrast Given 09/29/20 2020)  ondansetron (ZOFRAN) injection 4 mg (4 mg Intravenous Given 09/29/20 2153)  dicyclomine (BENTYL) capsule 10 mg (10 mg Oral Given 09/29/20 2233)     ED Discharge Orders    None       Note:  This document was prepared using Dragon voice recognition software and may include unintentional dictation errors.   Lucrezia Starch, MD 09/30/20 [phone]

## 2020-09-29 NOTE — H&P (Addendum)
[facility]   PATIENT NAME: Deanna George    MR#:  [id number]  DATE OF BIRTH:  [birth year]  DATE OF ADMISSION:  09/29/2020  PRIMARY CARE PHYSICIAN: [name], MD   REQUESTING/REFERRING PHYSICIAN: Hulan Saas, MD  CHIEF COMPLAINT:   Chief Complaint  Patient presents with   Nausea   Emesis    HISTORY OF PRESENT ILLNESS:  Deanna George  is a 54 y.o. Caucasian female with a known history of multiple medical problems including hypertension, GERD, C. difficile infection and esophageal stricture, who presented to the emergency room with acute onset of intractable nausea and vomiting with diarrhea for the last couple of days.  She had a syncopal episode 5 times yesterday.  She describes her diarrhea as watery and mucousy and it has been associated with abdominal colic and cramps.  No melena or bright red bleeding per rectum.  No bilious vomitus or hematemesis.  No dyspnea or cough or wheezing.  No chest pain or palpitations.  Upon presentation to the emergency room, blood pressure was 169/111 with a pulse of 120 with respiratory rate of 22.  Labs revealed borderline potassium of 3.5 with blood glucose of 243 and a creatinine 1.2 compared to 0.8 last year.  CBC showed leukocytosis.  Urinalysis showed 50 glucose and 20 ketones, 100 protein and specific gravity 1032.  Stool C. difficile came back negative.  GI panel came back negative.  COVID-19 PCR and influenza antigens came back negative.  EKG showed sinus tachycardia with a rate of 128.  Two-view chest x-ray showed no acute cardiopulmonary disease.  The patient was given 10 mg of p.o. Bentyl, 0.5 mg of IV Dilaudid, 2 L bolus of IV lactated Ringer and 4 mg of IV Zofran twice.  She will be admitted to a an observation medical monitored bed for further evaluation and management. 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    Situational stress    Urinary tract infection   Anxiety/depression  PAST SURGICAL HISTORY:   Past Surgical History:  Procedure Laterality Date   BILATERAL OOPHORECTOMY  01/2017   Laparoscopic at [facility] minimally invasive surgery department   BREAST EXCISIONAL BIOPSY Right 1995   CESAREAN SECTION  [id number]   x2   CHOLECYSTECTOMY N/A 01/28/2013   Procedure: LAPAROSCOPIC CHOLECYSTECTOMY WITH INTRAOPERATIVE CHOLANGIOGRAM;  Surgeon: Earnstine Regal, MD;  Location: [facility];  Service: General;  Laterality: N/A;   COLONOSCOPY     Foot pin post fracture  09/2008   Left foot x 2   KNEE ARTHROSCOPY  2011    right   LAPAROSCOPIC BILATERAL SALPINGECTOMY Bilateral 02/02/2015   Procedure: BILATERAL SALPINGECTOMY, ;  Surgeon: Anastasio Auerbach, MD;  Location: [facility];  Service: Gynecology;  Laterality: Bilateral;   Laparoscopic surgery  01/2007   uterus and ovary x 2   LAPAROSCOPIC VAGINAL HYSTERECTOMY     02/2008   LAPAROSCOPY N/A 02/02/2015   Procedure:  LAPAROSCOPY DIAGNOSTIC, FULGERATION ENDOMETREOSIS, EXCISION RIGHT OVARIAN CYST, LYSIS OF ADHESIONS, EXCISION VULVAR AND VAGINAL CYST ;  Surgeon: Anastasio Auerbach, MD;  Location: [facility];  Service: Gynecology;  Laterality: N/A;   MOLE REMOVAL Right 2010   US ECHOCARDIOGRAPHY  07/31/2008   EF 55-60% with mild LVH    SOCIAL HISTORY:   Social History   Tobacco Use   Smoking status: Current Some Day Smoker   Smokeless tobacco: Never Used  Substance Use Topics   Alcohol use: Not Currently    Comment: Rare    FAMILY HISTORY:   Family History  Adopted: Yes  Problem Relation Age  of Onset   Colon cancer Neg Hx    Pancreatic cancer Neg Hx    Stomach cancer Neg Hx    Rectal cancer Neg Hx     DRUG ALLERGIES:  No Known Allergies  REVIEW OF SYSTEMS:   ROS As per history of present illness. All pertinent systems were reviewed above. Constitutional, HEENT, cardiovascular, respiratory, GI, GU, musculoskeletal, neuro, psychiatric, endocrine, integumentary and hematologic systems were reviewed and are otherwise negative/unremarkable except for positive findings mentioned above in the HPI.   MEDICATIONS AT HOME:   Prior to Admission medications   Medication Sig Start Date End Date Taking? Authorizing Provider  albuterol (PROAIR HFA) 108 (90 Base) MCG/ACT inhaler 2 puffs as needed    [provider]  calcium carbonate (TUMS EX) 750 MG chewable tablet Chew 2 tablets by mouth 3 (three) times daily.    [provider]  Cholecalciferol (VITAMIN D3) 50 MCG (2000 UT) capsule 1 capsule    [provider]  clonazePAM (KLONOPIN) 1 MG tablet Take 1 tablet (1 mg total) by mouth 2 (two) times daily. 07/22/20   Mozingo, Berdie Ogren, NP  cyclobenzaprine (FLEXERIL) 10 MG tablet TAKE 1 TABLET BY MOUTH EVERY 8 HOURS AS NEEDED FOR 30 DAYS 07/03/19   [provider]  diazepam (VALIUM) 5 MG tablet Take 5 mg by mouth every 6 (six) hours as needed for anxiety.    [provider]  dicyclomine (BENTYL) 10 MG capsule Take 1 capsule (10 mg total) by mouth 4 (four) times daily -  before meals and at bedtime. 01/16/20   Levin Erp, PA  diphenhydrAMINE (BENADRYL) 25 mg capsule Take 25 mg by mouth every 6 (six) hours as needed for allergies.    [provider]  DULoxetine (CYMBALTA) 60 MG capsule Take 2 capsules (120 mg total) by mouth daily. 03/09/20   Mozingo, Berdie Ogren, NP  ibuprofen (ADVIL) 800 MG tablet 1 tablet with food or milk as needed    [provider]  losartan (COZAAR) 50 MG tablet Take 1 tablet (50 mg total)  by mouth in the morning and at bedtime. 09/20/20   Burtis Junes, NP  Melatonin 10 MG CAPS Take 10 mg by mouth at bedtime as needed.     [provider]  metoprolol tartrate (LOPRESSOR) 25 MG tablet TAKE 1 TABLET BY MOUTH TWICE DAILY. 09/20/20   Burtis Junes, NP  omeprazole (PRILOSEC) 40 MG capsule Take 1 capsule (40 mg total) by mouth in the morning and at bedtime. 08/19/20   Levin Erp, PA  ondansetron (ZOFRAN) 4 MG tablet Take 1 tablet (4 mg total) by mouth every 6 (six) hours as needed for nausea or vomiting. 01/16/20   Levin Erp, PA  prazosin (MINIPRESS) 2 MG capsule TAKE 1 CAPSULE (2 MG TOTAL) BY MOUTH AT BEDTIME. Patient taking  differently: Take 4 mg by mouth at bedtime.  02/10/20   Mozingo, Berdie Ogren, NP  prazosin (MINIPRESS) 2 MG capsule Take two capsules at bedtime. 02/24/20   Mozingo, Berdie Ogren, NP  Probiotic Product (PROBIOTIC-10 PO) Take by mouth daily.    [provider]  promethazine (PHENERGAN) 25 MG tablet Take 1 tablet (25 mg total) by mouth every 6 (six) hours as needed for nausea or vomiting. 01/16/20   Levin Erp, PA  QUEtiapine (SEROQUEL) 100 MG tablet Take 1 tablet (100 mg total) by mouth at bedtime. 03/09/20   Mozingo, Berdie Ogren, NP  rizatriptan (MAXALT-MLT) 10 MG disintegrating tablet Take 1 tablet (10 mg total) by mouth as needed. May repeat in 2 hours if needed 03/06/19   Suzzanne Cloud, NP  SUMAtriptan (IMITREX) 50 MG tablet Take 50 mg by mouth every 2 (two) hours as needed for migraine. May repeat in 2 hours if headache persists or recurs.    [provider]  SUMAtriptan (IMITREX) 6 MG/0.5ML SOLN injection Inject 0.5 mLs (6 mg total) into the skin as needed for migraine. May repeat in 2 hours if headache persists or recurs. 02/13/18   Dennie Bible, NP      VITAL SIGNS:  Blood pressure (!) 163/94, pulse (!) 101, temperature 98.6 F (37 C), temperature source Oral, resp. rate 20, height  5' 4.5" (1.638 m), weight 95.3 kg, SpO2 94 %.  PHYSICAL EXAMINATION:  Physical Exam  GENERAL:  54 y.o.-year-old Caucasian female patient lying in the bed with no acute distress.  EYES: Pupils equal, round, reactive to light and accommodation. No scleral icterus. Extraocular muscles intact.  HEENT: Head atraumatic, normocephalic. Oropharynx and nasopharynx clear.  NECK:  Supple, no jugular venous distention. No thyroid enlargement, no tenderness.  LUNGS: Normal breath sounds bilaterally, no wheezing, rales,rhonchi or crepitation. No use of accessory muscles of respiration.  CARDIOVASCULAR: Regular rate and rhythm, S1, S2 normal. No murmurs, rubs, or gallops.  ABDOMEN: Soft, nondistended, nontender. Bowel sounds present. No organomegaly or mass.  EXTREMITIES: No pedal edema, cyanosis, or clubbing.  NEUROLOGIC: Cranial nerves II through XII are intact. Muscle strength 5/5 in all extremities. Sensation intact. Gait not checked.  PSYCHIATRIC: The patient is alert and oriented x 3.  Normal affect and good eye contact. SKIN: No obvious rash, lesion, or ulcer.   LABORATORY PANEL:   CBC Recent Labs  Lab 09/29/20 1651  WBC 13.5*  HGB 13.1  HCT 40.1  PLT 299   ------------------------------------------------------------------------------------------------------------------  Chemistries  Recent Labs  Lab 09/29/20 1651  NA 137  K 3.5  CL 105  CO2 18*  GLUCOSE 243*  BUN 16  CREATININE 1.21*  CALCIUM 9.3  MG 2.1  AST 14*  ALT 10  ALKPHOS 107  BILITOT 0.7   ------------------------------------------------------------------------------------------------------------------  Cardiac Enzymes No results for input(s): TROPONINI in the last 168 hours. ------------------------------------------------------------------------------------------------------------------  RADIOLOGY:  DG Chest 2 View  Result Date: 09/29/2020 CLINICAL DATA:  Shortness of breath EXAM: CHEST - 2 VIEW  COMPARISON:  09/28/2017 FINDINGS: The heart size and mediastinal contours are within normal limits. Both lungs are clear. The visualized skeletal structures are unremarkable. IMPRESSION: Normal study. Electronically Signed   By: Rolm Baptise M.D.   On: 09/29/2020 19:53   CT ABDOMEN PELVIS W CONTRAST  Result Date: 09/29/2020 CLINICAL DATA:  54 year old female with abdominal pain. EXAM: CT ABDOMEN AND PELVIS WITH CONTRAST TECHNIQUE: Multidetector CT imaging of the abdomen and pelvis was performed using the standard protocol following bolus  administration of intravenous contrast. CONTRAST:  144mL OMNIPAQUE IOHEXOL 300 MG/ML  SOLN COMPARISON:  CT abdomen pelvis dated 02/11/2015. FINDINGS: Lower chest: The visualized lung bases are clear. No intra-abdominal free air. Trace free fluid in the pelvis. Hepatobiliary: Probable mild fatty liver. No intrahepatic biliary dilatation. Cholecystectomy. No retained calcified stone noted in the central CBD. Pancreas: Unremarkable. No pancreatic ductal dilatation or surrounding inflammatory changes. Spleen: Normal in size without focal abnormality. Adrenals/Urinary Tract: The adrenal glands unremarkable. The left kidney is unremarkable. There is severe right hydronephrosis with severe right renal parenchyma atrophy, likely secondary to longstanding hydronephrosis. There is a transition at the right ureteropelvic junction. No calcified stone identified. Findings concerning for a stricture at the right UPJ. A urothelial lesion or neoplasm is not excluded. Clinical correlation and further evaluation with direct visualization recommended. The urinary bladder is collapsed. Stomach/Bowel: There is long segment inflammatory changes and thickening of the distal and terminal ileum consistent with enteritis. Findings may be infectious in etiology, although an inflammatory bowel disease is not excluded clinical correlation is recommended. There is no bowel obstruction. The appendix is  normal. Vascular/Lymphatic: The abdominal aorta and IVC unremarkable. No portal venous gas. There is no adenopathy. Reproductive: Hysterectomy. No adnexal masses. Other: None Musculoskeletal: No acute or significant osseous findings. Right L5 pars defect. No listhesis. IMPRESSION: 1. Enteritis of the distal small bowel. No bowel obstruction. Normal appendix. 2. Severe right hydronephrosis concerning for a stricture at the right UPJ. A urothelial lesion is not excluded. Clinical correlation and further evaluation with direct visualization recommended. Electronically Signed   By: Anner Crete M.D.   On: 09/29/2020 20:37      IMPRESSION AND PLAN:   1.  Acute gastroenteritis likely viral. -The patient will be admitted to a medical monitored bed. -She will be hydrated with IV normal saline. -As needed antiemetics will be provided. -She will be placed on clear liquids to be advanced as tolerated.  2.  Acute kidney injury, likely prerenal secondary to volume depletion and dehydration. -The patient will be hydrated with IV normal saline and will follow BMP. -We will avoid nephrotoxins.  3.  Syncope. -This likely due to orthostatic hypotension associated volume depletion and dehydration. -The patient will be monitored for arrhythmias. -We will check orthostatics every 12 hours.  4.  Hyperglycemia, possibly secondary to new onset diabetes mellitus . -We will obtain hemoglobin A1c level. -The patient will be placed on supplement coverage with NovoLog and diabetic diet.  5.  Essential hypertension. -We will continue antihypertensives while holding off Cozaar given mild acute kidney injury.  6.  Anxiety and depression. -We will continue Cymbalta, Klonopin and Seroquel.  7.  DVT prophylaxis. -Subcutaneous Lovenox.    All the records are reviewed and case discussed with ED provider. The plan of care was discussed in details with the patient (and family). I answered all questions. The  patient agreed to proceed with the above mentioned plan. Further management will depend upon hospital course.   CODE STATUS: Full code  Status is: Observation  The patient remains OBS appropriate and will d/c before 2 midnights.  Dispo: The patient is from: Home              Anticipated d/c is to: Home              Anticipated d/c date is: 1 day              Patient currently is not medically stable to d/c.  TOTAL TIME TAKING CARE OF THIS PATIENT: 55 minutes.    Christel Mormon M.D on 09/29/2020 at 10:43 PM  [facility]   From 7 PM-7 AM, contact night-coverage [email or web address]  CC: Primary care physician; [name], MD

## 2020-09-29 NOTE — ED Triage Notes (Signed)
Pt here via [facility] with NVD since yesterday. Pt states that it feels like she is going to pass out. Pt states that she can't keep anything down. Pt states that the smell of her BM and her symptoms are similar to when she had c diff. Pt NAD in triage.

## 2020-09-29 NOTE — ED Notes (Signed)
Pt daughter [place] - [phone]

## 2020-09-29 NOTE — ED Notes (Signed)
Pt ambulated to bedside toilet at this time. This RN at bedside for assistance, but minimal assistance needed.

## 2020-09-30 ENCOUNTER — Other Ambulatory Visit: Payer: Self-pay

## 2020-09-30 ENCOUNTER — Encounter: Payer: Self-pay | Admitting: Family Medicine

## 2020-09-30 DIAGNOSIS — Z79899 Other long term (current) drug therapy: Secondary | ICD-10-CM | POA: Diagnosis not present

## 2020-09-30 DIAGNOSIS — E872 Acidosis: Secondary | ICD-10-CM | POA: Diagnosis present

## 2020-09-30 DIAGNOSIS — G43909 Migraine, unspecified, not intractable, without status migrainosus: Secondary | ICD-10-CM | POA: Diagnosis present

## 2020-09-30 DIAGNOSIS — E1165 Type 2 diabetes mellitus with hyperglycemia: Secondary | ICD-10-CM | POA: Diagnosis present

## 2020-09-30 DIAGNOSIS — K529 Noninfective gastroenteritis and colitis, unspecified: Secondary | ICD-10-CM | POA: Diagnosis not present

## 2020-09-30 DIAGNOSIS — A084 Viral intestinal infection, unspecified: Secondary | ICD-10-CM | POA: Diagnosis not present

## 2020-09-30 DIAGNOSIS — N131 Hydronephrosis with ureteral stricture, not elsewhere classified: Secondary | ICD-10-CM | POA: Diagnosis present

## 2020-09-30 DIAGNOSIS — N179 Acute kidney failure, unspecified: Secondary | ICD-10-CM | POA: Diagnosis present

## 2020-09-30 DIAGNOSIS — F419 Anxiety disorder, unspecified: Secondary | ICD-10-CM | POA: Diagnosis present

## 2020-09-30 DIAGNOSIS — Z20822 Contact with and (suspected) exposure to covid-19: Secondary | ICD-10-CM | POA: Diagnosis present

## 2020-09-30 DIAGNOSIS — E860 Dehydration: Secondary | ICD-10-CM | POA: Diagnosis not present

## 2020-09-30 DIAGNOSIS — F32A Depression, unspecified: Secondary | ICD-10-CM | POA: Diagnosis present

## 2020-09-30 DIAGNOSIS — R55 Syncope and collapse: Secondary | ICD-10-CM | POA: Diagnosis present

## 2020-09-30 DIAGNOSIS — K219 Gastro-esophageal reflux disease without esophagitis: Secondary | ICD-10-CM | POA: Diagnosis present

## 2020-09-30 DIAGNOSIS — F17200 Nicotine dependence, unspecified, uncomplicated: Secondary | ICD-10-CM | POA: Diagnosis present

## 2020-09-30 DIAGNOSIS — I10 Essential (primary) hypertension: Secondary | ICD-10-CM | POA: Diagnosis present

## 2020-09-30 LAB — GLUCOSE, CAPILLARY
Glucose-Capillary: 178 mg/dL — ABNORMAL HIGH (ref 70–99)
Glucose-Capillary: 167 mg/dL — ABNORMAL HIGH (ref 70–99)
Glucose-Capillary: 135 mg/dL — ABNORMAL HIGH (ref 70–99)
Glucose-Capillary: 128 mg/dL — ABNORMAL HIGH (ref 70–99)

## 2020-09-30 LAB — CBC
HCT: 33.6 % — ABNORMAL LOW (ref 36.0–46.0)
Hemoglobin: 10.9 g/dL — ABNORMAL LOW (ref 12.0–15.0)
MCH: 26.9 pg (ref 26.0–34.0)
MCHC: 32.4 g/dL (ref 30.0–36.0)
MCV: 83 fL (ref 80.0–100.0)
Platelets: 202 10*3/uL (ref 150–400)
RBC: 4.05 MIL/uL (ref 3.87–5.11)
RDW: 13.7 % (ref 11.5–15.5)
WBC: 12.5 10*3/uL — ABNORMAL HIGH (ref 4.0–10.5)
nRBC: 0 % (ref 0.0–0.2)

## 2020-09-30 LAB — BASIC METABOLIC PANEL
Anion gap: 10 (ref 5–15)
BUN: 12 mg/dL (ref 6–20)
CO2: 24 mmol/L (ref 22–32)
Calcium: 9 mg/dL (ref 8.9–10.3)
Chloride: 104 mmol/L (ref 98–111)
Creatinine, Ser: 1.14 mg/dL — ABNORMAL HIGH (ref 0.44–1.00)
GFR, Estimated: 58 mL/min — ABNORMAL LOW (ref >60)
Glucose, Bld: 229 mg/dL — ABNORMAL HIGH (ref 70–99)
Potassium: 3.4 mmol/L — ABNORMAL LOW (ref 3.5–5.1)
Sodium: 138 mmol/L (ref 135–145)

## 2020-09-30 LAB — HEMOGLOBIN A1C
Hgb A1c MFr Bld: 10.3 % — ABNORMAL HIGH (ref 4.8–5.6)
Mean Plasma Glucose: 248.91 mg/dL

## 2020-09-30 LAB — HIV ANTIBODY (ROUTINE TESTING W REFLEX): HIV Screen 4th Generation wRfx: NONREACTIVE

## 2020-09-30 MED ORDER — ACETAMINOPHEN 325 MG PO TABS: 650.0000 mg | ORAL_TABLET | Freq: Four times a day (QID) | ORAL | Status: DC | PRN

## 2020-09-30 MED ORDER — SUMATRIPTAN SUCCINATE 6 MG/0.5ML ~~LOC~~ SOLN
6.0000 mg | SUBCUTANEOUS | Status: DC | PRN
  Filled 2020-09-30: qty 0.5

## 2020-09-30 MED ORDER — RISAQUAD PO CAPS
1.0000 | ORAL_CAPSULE | Freq: Every day | ORAL | Status: DC
  Administered 2020-09-30 – 2020-10-03 (×4): 1 via ORAL
  Filled 2020-09-30 (×4): qty 1

## 2020-09-30 MED ORDER — PRAZOSIN HCL 2 MG PO CAPS: 2.0000 mg | ORAL_CAPSULE | Freq: Every day | ORAL | Status: DC

## 2020-09-30 MED ORDER — MELATONIN 5 MG PO TABS
10.0000 mg | ORAL_TABLET | Freq: Every evening | ORAL | Status: DC | PRN
  Administered 2020-09-30: 10 mg via ORAL
  Filled 2020-09-30 (×2): qty 2

## 2020-09-30 MED ORDER — METOPROLOL TARTRATE 25 MG PO TABS
25.0000 mg | ORAL_TABLET | Freq: Two times a day (BID) | ORAL | Status: DC
  Administered 2020-09-30 – 2020-10-03 (×7): 25 mg via ORAL
  Filled 2020-09-30 (×8): qty 1

## 2020-09-30 MED ORDER — ENOXAPARIN SODIUM 60 MG/0.6ML ~~LOC~~ SOLN
50.0000 mg | SUBCUTANEOUS | Status: DC
  Administered 2020-09-30 – 2020-10-03 (×4): 50 mg via SUBCUTANEOUS
  Filled 2020-09-30 (×4): qty 0.6

## 2020-09-30 MED ORDER — ONDANSETRON HCL 4 MG/2ML IJ SOLN
4.0000 mg | Freq: Four times a day (QID) | INTRAMUSCULAR | Status: DC | PRN
  Administered 2020-09-30 – 2020-10-02 (×5): 4 mg via INTRAVENOUS
  Filled 2020-09-30 (×5): qty 2

## 2020-09-30 MED ORDER — QUETIAPINE FUMARATE 25 MG PO TABS
100.0000 mg | ORAL_TABLET | Freq: Every day | ORAL | Status: DC
  Administered 2020-09-30 – 2020-10-02 (×3): 100 mg via ORAL
  Filled 2020-09-30 (×3): qty 4

## 2020-09-30 MED ORDER — INFLUENZA VAC SPLIT QUAD 0.5 ML IM SUSY
0.5000 mL | PREFILLED_SYRINGE | INTRAMUSCULAR | Status: DC
  Filled 2020-09-30: qty 0.5

## 2020-09-30 MED ORDER — RIZATRIPTAN BENZOATE 10 MG PO TBDP: 10.0000 mg | ORAL_TABLET | Freq: Once | ORAL | Status: DC | PRN

## 2020-09-30 MED ORDER — SUMATRIPTAN SUCCINATE 50 MG PO TABS
50.0000 mg | ORAL_TABLET | ORAL | Status: DC | PRN
  Administered 2020-09-30: 50 mg via ORAL
  Filled 2020-09-30 (×2): qty 1

## 2020-09-30 MED ORDER — PANTOPRAZOLE SODIUM 40 MG IV SOLR
40.0000 mg | Freq: Two times a day (BID) | INTRAVENOUS | Status: DC
  Administered 2020-09-30 – 2020-10-01 (×3): 40 mg via INTRAVENOUS
  Filled 2020-09-30 (×3): qty 40

## 2020-09-30 MED ORDER — ALBUTEROL SULFATE (2.5 MG/3ML) 0.083% IN NEBU: 2.5000 mg | INHALATION_SOLUTION | RESPIRATORY_TRACT | Status: DC | PRN

## 2020-09-30 MED ORDER — POTASSIUM CHLORIDE 20 MEQ PO PACK
40.0000 meq | PACK | Freq: Once | ORAL | Status: AC
  Administered 2020-09-30: 40 meq via ORAL
  Filled 2020-09-30: qty 2

## 2020-09-30 MED ORDER — METRONIDAZOLE IN NACL 5-0.79 MG/ML-% IV SOLN
500.0000 mg | Freq: Three times a day (TID) | INTRAVENOUS | Status: DC
  Filled 2020-09-30 (×3): qty 100

## 2020-09-30 MED ORDER — DIAZEPAM 5 MG PO TABS: 5.0000 mg | ORAL_TABLET | Freq: Four times a day (QID) | ORAL | Status: DC | PRN

## 2020-09-30 MED ORDER — INSULIN ASPART 100 UNIT/ML ~~LOC~~ SOLN
0.0000 [IU] | Freq: Three times a day (TID) | SUBCUTANEOUS | Status: DC
  Administered 2020-09-30 (×2): 2 [IU] via SUBCUTANEOUS
  Administered 2020-09-30 – 2020-10-02 (×3): 1 [IU] via SUBCUTANEOUS
  Administered 2020-10-02: 3 [IU] via SUBCUTANEOUS
  Administered 2020-10-02: 22:00:00 1 [IU] via SUBCUTANEOUS
  Filled 2020-09-30 (×7): qty 1

## 2020-09-30 MED ORDER — ONDANSETRON HCL 4 MG PO TABS: 4.0000 mg | ORAL_TABLET | Freq: Four times a day (QID) | ORAL | Status: DC | PRN

## 2020-09-30 MED ORDER — PRAZOSIN HCL 2 MG PO CAPS
4.0000 mg | ORAL_CAPSULE | Freq: Every day | ORAL | Status: DC
  Administered 2020-09-30 – 2020-10-02 (×3): 4 mg via ORAL
  Filled 2020-09-30 (×5): qty 2

## 2020-09-30 MED ORDER — CYCLOBENZAPRINE HCL 10 MG PO TABS: 10.0000 mg | ORAL_TABLET | Freq: Three times a day (TID) | ORAL | Status: DC | PRN

## 2020-09-30 MED ORDER — DIPHENHYDRAMINE HCL 25 MG PO CAPS: 25.0000 mg | ORAL_CAPSULE | Freq: Four times a day (QID) | ORAL | Status: DC | PRN

## 2020-09-30 MED ORDER — MORPHINE SULFATE (PF) 2 MG/ML IV SOLN
2.0000 mg | INTRAVENOUS | Status: DC | PRN
  Administered 2020-09-30 (×3): 2 mg via INTRAVENOUS
  Filled 2020-09-30 (×3): qty 1

## 2020-09-30 MED ORDER — PROMETHAZINE HCL 25 MG/ML IJ SOLN
12.5000 mg | Freq: Four times a day (QID) | INTRAMUSCULAR | Status: DC | PRN
  Administered 2020-09-30 – 2020-10-02 (×3): 12.5 mg via INTRAVENOUS
  Filled 2020-09-30 (×3): qty 1

## 2020-09-30 MED ORDER — PANTOPRAZOLE SODIUM 40 MG PO TBEC: 40.0000 mg | DELAYED_RELEASE_TABLET | Freq: Every day | ORAL | Status: DC

## 2020-09-30 MED ORDER — SODIUM CHLORIDE 0.9 % IV SOLN: INTRAVENOUS | Status: DC

## 2020-09-30 MED ORDER — CALCIUM CARBONATE ANTACID 500 MG PO CHEW
3.0000 | CHEWABLE_TABLET | Freq: Three times a day (TID) | ORAL | Status: DC
  Administered 2020-09-30 – 2020-10-03 (×10): 600 mg via ORAL
  Filled 2020-09-30 (×11): qty 3

## 2020-09-30 MED ORDER — ACETAMINOPHEN 650 MG RE SUPP: 650.0000 mg | Freq: Four times a day (QID) | RECTAL | Status: DC | PRN

## 2020-09-30 MED ORDER — DICYCLOMINE HCL 10 MG PO CAPS
10.0000 mg | ORAL_CAPSULE | Freq: Three times a day (TID) | ORAL | Status: DC
  Administered 2020-09-30 – 2020-10-03 (×12): 10 mg via ORAL
  Filled 2020-09-30 (×16): qty 1

## 2020-09-30 MED ORDER — TRAZODONE HCL 50 MG PO TABS: 25.0000 mg | ORAL_TABLET | Freq: Every evening | ORAL | Status: DC | PRN

## 2020-09-30 MED ORDER — METRONIDAZOLE IN NACL 5-0.79 MG/ML-% IV SOLN
500.0000 mg | Freq: Once | INTRAVENOUS | Status: AC
  Administered 2020-09-30: 12:00:00 500 mg via INTRAVENOUS
  Filled 2020-09-30: qty 100

## 2020-09-30 MED ORDER — CIPROFLOXACIN IN D5W 400 MG/200ML IV SOLN
400.0000 mg | Freq: Two times a day (BID) | INTRAVENOUS | Status: AC
  Administered 2020-09-30 – 2020-10-01 (×4): 400 mg via INTRAVENOUS
  Filled 2020-09-30 (×4): qty 200

## 2020-09-30 MED ORDER — METRONIDAZOLE 500 MG PO TABS
500.0000 mg | ORAL_TABLET | Freq: Three times a day (TID) | ORAL | Status: DC
  Administered 2020-09-30 – 2020-10-01 (×2): 500 mg via ORAL
  Filled 2020-09-30 (×2): qty 1

## 2020-09-30 MED ORDER — CLONAZEPAM 0.5 MG PO TABS
1.0000 mg | ORAL_TABLET | Freq: Two times a day (BID) | ORAL | Status: DC
  Administered 2020-09-30 – 2020-10-03 (×8): 1 mg via ORAL
  Filled 2020-09-30 (×8): qty 2

## 2020-09-30 MED ORDER — PROMETHAZINE HCL 25 MG PO TABS
25.0000 mg | ORAL_TABLET | Freq: Four times a day (QID) | ORAL | Status: DC | PRN
  Filled 2020-09-30: qty 1

## 2020-09-30 MED ORDER — LIVING WELL WITH DIABETES BOOK
Freq: Once | Status: AC
  Filled 2020-09-30: qty 1

## 2020-09-30 MED ORDER — DULOXETINE HCL 60 MG PO CPEP
120.0000 mg | ORAL_CAPSULE | Freq: Every day | ORAL | Status: DC
  Administered 2020-09-30 – 2020-10-03 (×4): 120 mg via ORAL
  Filled 2020-09-30: qty 2
  Filled 2020-09-30 (×3): qty 4
  Filled 2020-09-30 (×3): qty 2

## 2020-09-30 NOTE — Progress Notes (Signed)
Inpatient Diabetes Program Recommendations  AACE/ADA: New Consensus Statement on Inpatient Glycemic Control (2015)  Target Ranges:  Prepandial:   less than 140 mg/dL      Peak postprandial:   less than 180 mg/dL (1-2 hours)      Critically ill patients:  140 - 180 mg/dL   Lab Results  Component Value Date   GLUCAP 231 (H) 09/29/2020   HGBA1C 10.3 (H) 09/29/2020    Review of Glycemic Control Results for Deanna George, Deanna George (MRN [id number]) as of 09/30/2020 06:54  Ref. Range 09/29/2020 20:37  Glucose-Capillary Latest Ref Range: 70 - 99 mg/dL 231 (H)   Results for Deanna George, Deanna George (MRN [id number]) as of 09/30/2020 13:51  Ref. Range 09/30/2020 08:06 09/30/2020 11:24  Glucose-Capillary Latest Ref Range: 70 - 99 mg/dL 178 (H) 167 (H)   Diabetes history: None noted- NEW ONSET DM Current orders for Inpatient glycemic control:  Novolog sensitive tid with meals and HS  Inpatient Diabetes Program Recommendations:    New diagnosis of DM. Will discuss with patient when appropriate.  May be able to be on oral agents at d/c with lifestyle modifications as well.  Will order Living well with DM booklet for patient as well.   Thanks,  Adah Perl, RN, BC-ADM Inpatient Diabetes Coordinator Pager [phone] (8a-5p)

## 2020-09-30 NOTE — Progress Notes (Signed)
Anticoagulation monitoring(Lovenox):  54 yo female ordered Lovenox 40 mg Q24h  Filed Weights   09/29/20 1646  Weight: 95.3 kg (210 lb)   BMI 35.49   Lab Results  Component Value Date   CREATININE 1.21 (H) 09/29/2020   CREATININE 0.82 11/04/2019   CREATININE 0.95 09/28/2017   Estimated Creatinine Clearance: 61.6 mL/min (A) (by C-G formula based on SCr of 1.21 mg/dL (H)). Hemoglobin & Hematocrit     Component Value Date/Time   HGB 13.1 09/29/2020 1651   HGB 12.5 03/27/2017 1543   HCT 40.1 09/29/2020 1651   HCT 39.0 03/27/2017 1543     Per Protocol for Patient with estCrcl > 30 ml/min and BMI > 30, will transition to Lovenox 50 mg Q24h.

## 2020-09-30 NOTE — Progress Notes (Signed)
PROGRESS NOTE    Deanna George  QPY:[id number] DOB: 1965/11/30 DOA: 09/29/2020 PCP: [name], MD   Brief Narrative:  Caucasian female with a known history of multiple medical problems including hypertension, GERD, C. difficile infection and esophageal stricture, who presented to the emergency room with acute onset of intractable nausea and vomiting with diarrhea for the last couple of days.  She was treated on admission with Bentyl, Dilaudid, Zofran, intravenous fluids with para good result.  CT abdomen pelvis demonstrated distal enteritis, normal appendix.  Also noted to have chronic right hydronephrosis with UPJ stricture.  Per the patient this was known from 2016.  She states she had a urology evaluation at that time though I cannot find record in care everywhere. 11/18-patient very anxious.  Lying unclothed in bed.  Tremulous.  Endorses severe nausea, diarrhea, abdominal pain.  Vital signs stable.  Patient afebrile     Assessment & Plan:   Active Problems:   Gastroenteritis  Acute gastroenteritis Intractable nausea and vomiting Per CT abdomen pelvis small bowel enteritis Suspect infectious etiology Difficult to exclude bacterial etiology at this time Plan: Continue treatment as fluids Antiemetics as needed Pain control as needed.  Avoid narcotics if able Start ciprofloxacin 500 mg IV twice daily Flagyl 500 mg p.o. 3 times daily Stool studies negative, enteric precautions discontinued  Right hydronephrosis, concern for UPJ stricture Acute kidney injury Patient said kidney function has been improving Suspect secondary to volume depletion and dehydration Patient is aware of right hydro and UPJ stricture.  States that he been worked up by urology in the past Plan: Intravenous fluids as above Avoid nephrotoxic agents Daily BMP We will consider urology evaluation once symptoms from gastroenteritis are improved  Syncope/near syncope Patient is unclear about the  circumstances surrounding this event Telemetry reassuring.  Can discontinue Check orthostatics  Hyperglycemia New diagnosis diabetes Patient is not a known diabetic Hemoglobin A1c 10.3 indicating underlying undiagnosed diabetes Plan: Continue NovoLog coverage Diabetic coordinator consult Carb modified diet when able to tolerate p.o.  Anxiety Depression Patient was extremely anxious appearing when I walked in this morning She was lying in bed totally unclothed States that she is running a fever, which is not true Complains of severe abdominal pain however belly soft on exam Patient is on a rather aggressive psychiatric regimen at home Plan: Continue home regimen of Cymbalta, Klonopin, Seroquel As needed Valium If anxiety remains an issue will involve psychiatry    DVT prophylaxis: Lovenox Code Status: Full  Family Communication: none today Disposition Plan: Status is: Observation  The patient will require care spanning > 2 midnights and should be moved to inpatient because: Ongoing active pain requiring inpatient pain management, IV treatments appropriate due to intensity of illness or inability to take PO and Inpatient level of care appropriate due to severity of illness  Dispo: The patient is from: Home              Anticipated d/c is to: Home              Anticipated d/c date is: 2 days              Patient currently is not medically stable to d/c.   Still symptomatic, endorses diarrhea, intractable abdominal pain, nausea and vomiting.  Anticipate 48 additional hours inpatient treatment and monitoring prior to disposition planning.      Consultants:   None  Procedures:   None  Antimicrobials:   Ciprofloxacin  Metronidazole   Subjective: Patient  seen and examined.  Totally unclothed when I walked in the room.  Tremulous.  States she is "running a fever".  Endorses severe abdominal pain, intractable nausea and vomiting, diarrhea that makes it difficult  for her to get to the bedside commode.  Objective: Vitals:   09/30/20 0507 09/30/20 0812 09/30/20 0816 09/30/20 0939  BP: (!) 150/64 (!) 157/99  (!) 143/81  Pulse: 78 91  79  Resp: 19  18   Temp: 98.7 F (37.1 C) 98.5 F (36.9 C)    TempSrc:      SpO2: 93% 96%    Weight:      Height:        Intake/Output Summary (Last 24 hours) at 09/30/2020 1038 Last data filed at 09/30/2020 0030 Gross per 24 hour  Intake 1240 ml  Output --  Net 1240 ml   Filed Weights   09/29/20 1646  Weight: 95.3 kg    Examination:  General exam: Extremely anxious Respiratory system: Tachypnea, shallow respirations, room air, clear Cardiovascular system: S1-S2, regular rate and rhythm, no murmurs  gastrointestinal system: Obese, none distended, tender to palpation epigastrium, positive bowel sounds Central nervous system: Alert and oriented. No focal neurological deficits. Extremities: Symmetric 5 x 5 power. Skin: No rashes, lesions or ulcers Psychiatry: Judgement and insight appear impaired. Mood & affect depressed, anxious.     Data Reviewed: I have personally reviewed following labs and imaging studies  CBC: Recent Labs  Lab 09/29/20 1651 09/30/20 0439  WBC 13.5* 12.5*  HGB 13.1 10.9*  HCT 40.1 33.6*  MCV 82.0 83.0  PLT 299 176   Basic Metabolic Panel: Recent Labs  Lab 09/29/20 1651 09/30/20 0439  NA 137 138  K 3.5 3.4*  CL 105 104  CO2 18* 24  GLUCOSE 243* 229*  BUN 16 12  CREATININE 1.21* 1.14*  CALCIUM 9.3 9.0  MG 2.1  --    GFR: Estimated Creatinine Clearance: 65.3 mL/min (A) (by C-G formula based on SCr of 1.14 mg/dL (H)). Liver Function Tests: Recent Labs  Lab 09/29/20 1651  AST 14*  ALT 10  ALKPHOS 107  BILITOT 0.7  PROT 7.7  ALBUMIN 4.2   Recent Labs  Lab 09/29/20 1651  LIPASE 23   No results for input(s): AMMONIA in the last 168 hours. Coagulation Profile: No results for input(s): INR, PROTIME in the last 168 hours. Cardiac Enzymes: No results  for input(s): CKTOTAL, CKMB, CKMBINDEX, TROPONINI in the last 168 hours. BNP (last 3 results) No results for input(s): PROBNP in the last 8760 hours. HbA1C: Recent Labs    09/29/20 1656  HGBA1C 10.3*   CBG: Recent Labs  Lab 09/29/20 2037 09/30/20 0806  GLUCAP 231* 178*   Lipid Profile: No results for input(s): CHOL, HDL, LDLCALC, TRIG, CHOLHDL, LDLDIRECT in the last 72 hours. Thyroid Function Tests: No results for input(s): TSH, T4TOTAL, FREET4, T3FREE, THYROIDAB in the last 72 hours. Anemia Panel: No results for input(s): VITAMINB12, FOLATE, FERRITIN, TIBC, IRON, RETICCTPCT in the last 72 hours. Sepsis Labs: No results for input(s): PROCALCITON, LATICACIDVEN in the last 168 hours.  Recent Results (from the past 240 hour(s))  Gastrointestinal Panel by PCR , Stool     Status: None   Collection Time: 09/29/20  5:11 PM   Specimen: Stool  Result Value Ref Range Status   Campylobacter species NOT DETECTED NOT DETECTED Final   Plesimonas shigelloides NOT DETECTED NOT DETECTED Final   Salmonella species NOT DETECTED NOT DETECTED Final   Yersinia enterocolitica  NOT DETECTED NOT DETECTED Final   Vibrio species NOT DETECTED NOT DETECTED Final   Vibrio cholerae NOT DETECTED NOT DETECTED Final   Enteroaggregative E coli (EAEC) NOT DETECTED NOT DETECTED Final   Enteropathogenic E coli (EPEC) NOT DETECTED NOT DETECTED Final   Enterotoxigenic E coli (ETEC) NOT DETECTED NOT DETECTED Final   Shiga like toxin producing E coli (STEC) NOT DETECTED NOT DETECTED Final   Shigella/Enteroinvasive E coli (EIEC) NOT DETECTED NOT DETECTED Final   Cryptosporidium NOT DETECTED NOT DETECTED Final   Cyclospora cayetanensis NOT DETECTED NOT DETECTED Final   Entamoeba histolytica NOT DETECTED NOT DETECTED Final   Giardia lamblia NOT DETECTED NOT DETECTED Final   Adenovirus F40/41 NOT DETECTED NOT DETECTED Final   Astrovirus NOT DETECTED NOT DETECTED Final   Norovirus GI/GII NOT DETECTED NOT DETECTED  Final   Rotavirus A NOT DETECTED NOT DETECTED Final   Sapovirus (I, II, IV, and V) NOT DETECTED NOT DETECTED Final    Comment: Performed at [facility], [address] [place], [place] [address]  C Difficile Quick Screen w PCR reflex     Status: None   Collection Time: 09/29/20  8:13 PM   Specimen: STOOL  Result Value Ref Range Status   C Diff antigen NEGATIVE NEGATIVE Final   C Diff toxin NEGATIVE NEGATIVE Final   C Diff interpretation No C. difficile detected.  Final    Comment: Performed at [facility], [address] [place], [place] [address]  Respiratory Panel by RT PCR (Flu A&B, Covid) - Nasopharyngeal Swab     Status: None   Collection Time: 09/29/20  8:13 PM   Specimen: Nasopharyngeal Swab  Result Value Ref Range Status   SARS Coronavirus 2 by RT PCR NEGATIVE NEGATIVE Final    Comment: (NOTE) SARS-CoV-2 target nucleic acids are NOT DETECTED.  The SARS-CoV-2 RNA is generally detectable in upper respiratoy specimens during the acute phase of infection. The lowest concentration of SARS-CoV-2 viral copies this assay can detect is 131 copies/mL. A negative result does not preclude SARS-Cov-2 infection and should not be used as the sole basis for treatment or other patient management decisions. A negative result may occur with  improper specimen collection/handling, submission of specimen other than nasopharyngeal swab, presence of viral mutation(s) within the areas targeted by this assay, and inadequate number of viral copies (<131 copies/mL). A negative result must be combined with clinical observations, patient history, and epidemiological information. The expected result is Negative.  Fact Sheet for Patients:  PinkCheek.be  Fact Sheet for Healthcare Providers:  GravelBags.it  This test is no t yet approved or cleared by the [place] FDA and  has been authorized for detection and/or  diagnosis of SARS-CoV-2 by FDA under an Emergency Use Authorization (EUA). This EUA will remain  in effect (meaning this test can be used) for the duration of the COVID-19 declaration under Section 564(b)(1) of the Act, 21 U.S.C. section 360bbb-3(b)(1), unless the authorization is terminated or revoked sooner.     Influenza A by PCR NEGATIVE NEGATIVE Final   Influenza B by PCR NEGATIVE NEGATIVE Final    Comment: (NOTE) The Xpert Xpress SARS-CoV-2/FLU/RSV assay is intended as an aid in  the diagnosis of influenza from Nasopharyngeal swab specimens and  should not be used as a sole basis for treatment. Nasal washings and  aspirates are unacceptable for Xpert Xpress SARS-CoV-2/FLU/RSV  testing.  Fact Sheet for Patients: PinkCheek.be  Fact Sheet for Healthcare Providers: GravelBags.it  This test  is not yet approved or cleared by the [place] and  has been authorized for detection and/or diagnosis of SARS-CoV-2 by  FDA under an Emergency Use Authorization (EUA). This EUA will remain  in effect (meaning this test can be used) for the duration of the  Covid-19 declaration under Section 564(b)(1) of the Act, 21  U.S.C. section 360bbb-3(b)(1), unless the authorization is  terminated or revoked. Performed at [facility], [address], [place], [place] [address]          Radiology Studies: DG Chest 2 View  Result Date: 09/29/2020 CLINICAL DATA:  Shortness of breath EXAM: CHEST - 2 VIEW COMPARISON:  09/28/2017 FINDINGS: The heart size and mediastinal contours are within normal limits. Both lungs are clear. The visualized skeletal structures are unremarkable. IMPRESSION: Normal study. Electronically Signed   By: Rolm Baptise M.D.   On: 09/29/2020 19:53   CT ABDOMEN PELVIS W CONTRAST  Result Date: 09/29/2020 CLINICAL DATA:  54 year old female with abdominal pain. EXAM: CT ABDOMEN AND PELVIS WITH CONTRAST  TECHNIQUE: Multidetector CT imaging of the abdomen and pelvis was performed using the standard protocol following bolus administration of intravenous contrast. CONTRAST:  151mL OMNIPAQUE IOHEXOL 300 MG/ML  SOLN COMPARISON:  CT abdomen pelvis dated 02/11/2015. FINDINGS: Lower chest: The visualized lung bases are clear. No intra-abdominal free air. Trace free fluid in the pelvis. Hepatobiliary: Probable mild fatty liver. No intrahepatic biliary dilatation. Cholecystectomy. No retained calcified stone noted in the central CBD. Pancreas: Unremarkable. No pancreatic ductal dilatation or surrounding inflammatory changes. Spleen: Normal in size without focal abnormality. Adrenals/Urinary Tract: The adrenal glands unremarkable. The left kidney is unremarkable. There is severe right hydronephrosis with severe right renal parenchyma atrophy, likely secondary to longstanding hydronephrosis. There is a transition at the right ureteropelvic junction. No calcified stone identified. Findings concerning for a stricture at the right UPJ. A urothelial lesion or neoplasm is not excluded. Clinical correlation and further evaluation with direct visualization recommended. The urinary bladder is collapsed. Stomach/Bowel: There is long segment inflammatory changes and thickening of the distal and terminal ileum consistent with enteritis. Findings may be infectious in etiology, although an inflammatory bowel disease is not excluded clinical correlation is recommended. There is no bowel obstruction. The appendix is normal. Vascular/Lymphatic: The abdominal aorta and IVC unremarkable. No portal venous gas. There is no adenopathy. Reproductive: Hysterectomy. No adnexal masses. Other: None Musculoskeletal: No acute or significant osseous findings. Right L5 pars defect. No listhesis. IMPRESSION: 1. Enteritis of the distal small bowel. No bowel obstruction. Normal appendix. 2. Severe right hydronephrosis concerning for a stricture at the right  UPJ. A urothelial lesion is not excluded. Clinical correlation and further evaluation with direct visualization recommended. Electronically Signed   By: Anner Crete M.D.   On: 09/29/2020 20:37        Scheduled Meds: . acidophilus  1 capsule Oral Daily  . calcium carbonate  3 tablet Oral TID  . clonazePAM  1 mg Oral BID  . dicyclomine  10 mg Oral TID AC & HS  . DULoxetine  120 mg Oral Daily  . enoxaparin (LOVENOX) injection  50 mg Subcutaneous Q24H  . [START ON 10/01/2020] influenza vac split quadrivalent PF  0.5 mL Intramuscular Tomorrow-1000  . insulin aspart  0-9 Units Subcutaneous TID PC & HS  . metoprolol tartrate  25 mg Oral BID  . metroNIDAZOLE  500 mg Oral Q8H  . pantoprazole (PROTONIX) IV  40 mg Intravenous Q12H  . prazosin  4  mg Oral QHS  . QUEtiapine  100 mg Oral QHS   Continuous Infusions: . sodium chloride 100 mL/hr at 09/30/20 0030  . ciprofloxacin 400 mg (09/30/20 0952)  . metronidazole       LOS: 0 days    Time spent: 25 minutes    Sidney Ace, MD [facility] Pager [phone]  If 7PM-7AM, please contact night-coverage 09/30/2020, 10:38 AM

## 2020-09-30 NOTE — Plan of Care (Signed)

## 2020-10-01 ENCOUNTER — Other Ambulatory Visit: Payer: Self-pay

## 2020-10-01 DIAGNOSIS — K529 Noninfective gastroenteritis and colitis, unspecified: Secondary | ICD-10-CM | POA: Diagnosis not present

## 2020-10-01 LAB — CBC WITH DIFFERENTIAL/PLATELET
Abs Immature Granulocytes: 0.02 10*3/uL (ref 0.00–0.07)
Basophils Absolute: 0 10*3/uL (ref 0.0–0.1)
Basophils Relative: 0 %
Eosinophils Absolute: 0.1 10*3/uL (ref 0.0–0.5)
Eosinophils Relative: 3 %
HCT: 30.7 % — ABNORMAL LOW (ref 36.0–46.0)
Hemoglobin: 10.1 g/dL — ABNORMAL LOW (ref 12.0–15.0)
Immature Granulocytes: 0 %
Lymphocytes Relative: 23 %
Lymphs Abs: 1.3 10*3/uL (ref 0.7–4.0)
MCH: 27.1 pg (ref 26.0–34.0)
MCHC: 32.9 g/dL (ref 30.0–36.0)
MCV: 82.3 fL (ref 80.0–100.0)
Monocytes Absolute: 0.3 10*3/uL (ref 0.1–1.0)
Monocytes Relative: 5 %
Neutro Abs: 3.9 10*3/uL (ref 1.7–7.7)
Neutrophils Relative %: 69 %
Platelets: 129 10*3/uL — ABNORMAL LOW (ref 150–400)
RBC: 3.73 MIL/uL — ABNORMAL LOW (ref 3.87–5.11)
RDW: 13.5 % (ref 11.5–15.5)
WBC: 5.6 10*3/uL (ref 4.0–10.5)
nRBC: 0 % (ref 0.0–0.2)

## 2020-10-01 LAB — BASIC METABOLIC PANEL
Anion gap: 10 (ref 5–15)
BUN: 7 mg/dL (ref 6–20)
CO2: 20 mmol/L — ABNORMAL LOW (ref 22–32)
Calcium: 8.6 mg/dL — ABNORMAL LOW (ref 8.9–10.3)
Chloride: 105 mmol/L (ref 98–111)
Creatinine, Ser: 1.1 mg/dL — ABNORMAL HIGH (ref 0.44–1.00)
GFR, Estimated: >60 mL/min (ref >60)
Glucose, Bld: 199 mg/dL — ABNORMAL HIGH (ref 70–99)
Potassium: 3.1 mmol/L — ABNORMAL LOW (ref 3.5–5.1)
Sodium: 135 mmol/L (ref 135–145)

## 2020-10-01 LAB — GLUCOSE, CAPILLARY
Glucose-Capillary: 149 mg/dL — ABNORMAL HIGH (ref 70–99)
Glucose-Capillary: 122 mg/dL — ABNORMAL HIGH (ref 70–99)
Glucose-Capillary: 146 mg/dL — ABNORMAL HIGH (ref 70–99)

## 2020-10-01 LAB — MAGNESIUM: Magnesium: 1.7 mg/dL (ref 1.7–2.4)

## 2020-10-01 MED ORDER — LOPERAMIDE HCL 2 MG PO CAPS
2.0000 mg | ORAL_CAPSULE | ORAL | Status: DC | PRN
  Administered 2020-10-01 (×2): 2 mg via ORAL
  Filled 2020-10-01 (×2): qty 1

## 2020-10-01 MED ORDER — PANTOPRAZOLE SODIUM 40 MG IV SOLR
40.0000 mg | INTRAVENOUS | Status: DC
  Administered 2020-10-02: 08:00:00 40 mg via INTRAVENOUS
  Filled 2020-10-01: qty 40

## 2020-10-01 MED ORDER — SIMETHICONE 80 MG PO CHEW
80.0000 mg | CHEWABLE_TABLET | Freq: Four times a day (QID) | ORAL | Status: DC | PRN
  Filled 2020-10-01: qty 1

## 2020-10-01 MED ORDER — OXYCODONE HCL 5 MG PO TABS
5.0000 mg | ORAL_TABLET | ORAL | Status: DC | PRN
  Administered 2020-10-01 – 2020-10-02 (×4): 5 mg via ORAL
  Filled 2020-10-01 (×4): qty 1

## 2020-10-01 MED ORDER — MORPHINE SULFATE (PF) 2 MG/ML IV SOLN: 2.0000 mg | INTRAVENOUS | Status: DC | PRN

## 2020-10-01 NOTE — Progress Notes (Signed)
Per patient she said it is ok for MD to discuss results of tests and update daughter Deanna George with any medical information.

## 2020-10-01 NOTE — Progress Notes (Signed)
Upon entering patient's room for assessment and morning medications, patient immediately begins describing multiple episodes of incontinent diarrhea. Report given by night shift RN stated patient had had 2-3 episodes of continent SOFT stool, NOT diarrhea. When patient asked about this, patient became very defensive and stated "I had burn marks and a rash from my anus up my back, so I know it was liquid." Pt stated she was "certain (she had) ecoli or diff or giardia or something."  After taking po medications patient requested to be medicated with zofran for nausea. Pt medicated and then assisted to bathroom per request. Very minimal soft BM noted.

## 2020-10-01 NOTE — Progress Notes (Signed)
Inpatient Diabetes Program Recommendations  AACE/ADA: New Consensus Statement on Inpatient Glycemic Control (2015)  Target Ranges:  Prepandial:   less than 140 mg/dL      Peak postprandial:   less than 180 mg/dL (1-2 hours)      Critically ill patients:  140 - 180 mg/dL   Lab Results  Component Value Date   GLUCAP 149 (H) 10/01/2020   HGBA1C 10.3 (H) 09/29/2020    Review of Glycemic Control Results for BRITTLEY, REGNER (MRN [id number]) as of 10/01/2020 13:19  Ref. Range 09/30/2020 08:06 09/30/2020 11:24 09/30/2020 16:02 09/30/2020 21:06 10/01/2020 12:40  Glucose-Capillary Latest Ref Range: 70 - 99 mg/dL 178 (H) 167 (H) 135 (H) 128 (H) 149 (H)   Diabetes history: None- new diagnosis Current orders for Inpatient glycemic control:  Novolog sensitive tid with meals and HS  Inpatient Diabetes Program Recommendations:    Call received from RN stating that patient is refusing insulin and CBG checks stating she does not have DM.  Sent message to MD and he states he will discuss with patient on 11/20 and have her f/u with PCP.  Will not speak with patient until she talks to PCP regarding whether she has diabetes or not? RN did give patient LWWD booklet yesterday?   Thanks  Adah Perl, RN, BC-ADM Inpatient Diabetes Coordinator Pager [phone] ([phone])

## 2020-10-01 NOTE — Progress Notes (Addendum)
PROGRESS NOTE    Deanna George  MWN:[id number] DOB: 01/20/1966 DOA: 09/29/2020 PCP: [name], MD   Brief Narrative:  Caucasian female with a known history of multiple medical problems including hypertension, GERD, C. difficile infection and esophageal stricture, who presented to the emergency room with acute onset of intractable nausea and vomiting with diarrhea for the last couple of days.  She was treated on admission with Bentyl, Dilaudid, Zofran, intravenous fluids with para good result.  CT abdomen pelvis demonstrated distal enteritis, normal appendix.  Also noted to have chronic right hydronephrosis with UPJ stricture.  Per the patient this was known from 2016.  She states she had a urology evaluation at that time though I cannot find record in care everywhere. 11/18-patient very anxious.  Lying unclothed in bed.  Tremulous.  Endorses severe nausea, diarrhea, abdominal pain.  Vital signs stable.  Patient afebrile 11/19-patient's anxiety seems better controlled today.  GI PCR and C. difficile negative.  Patient endorses frequent episodes of diarrhea however per nursing these are soft stools and not indicative of true diarrhea.      Assessment & Plan:   Active Problems:   Gastroenteritis  Acute gastroenteritis Intractable nausea and vomiting Per CT abdomen pelvis small bowel enteritis Suspect infectious etiology Difficult to exclude bacterial etiology at this time Improving over interval Plan: Continue IV fluids Antiemetics as needed Pain control as needed.  Avoid narcotics if able Start ciprofloxacin 500 mg IV twice daily.  Last dose today DC Flagyl Stool studies negative, enteric precautions discontinued  Right hydronephrosis, concern for UPJ stricture Acute kidney injury kidney function has been improving Suspect secondary to volume depletion and dehydration Patient is aware of right hydro and UPJ stricture.  States that he been worked up by urology in the  past Plan: Intravenous fluids as above Avoid nephrotoxic agents Daily BMP As creatinine is improving can defer urology evaluation to outpatient  Syncope/near syncope Patient is unclear about the circumstances surrounding this event Telemetry reassuring.  Can discontinue Orthostatics negative  Hyperglycemia New diagnosis diabetes Patient is not a known diabetic Hemoglobin A1c 10.3 indicating underlying undiagnosed diabetes Plan: Continue NovoLog coverage Diabetic coordinator consult Carb modified diet when able to tolerate p.o.  Anxiety Depression Anxiety appears to be improved Patient is tremulous or nervous appearing She was lying in bed totally unclothed on my evaluation 09/30/2020 Abdominal pain also improved Patient is on a rather aggressive psychiatric regimen at home Plan: Continue home regimen of Cymbalta, Klonopin, Seroquel As needed Valium If anxiety remains an issue will involve psychiatry    DVT prophylaxis: Lovenox Code Status: Full  Family Communication: Daughter Rashea Hoskie [phone] on 10/01/2020 Disposition Plan: Status is: Inpatient  Remains inpatient appropriate because:IV treatments appropriate due to intensity of illness or inability to take PO and Inpatient level of care appropriate due to severity of illness   Dispo: The patient is from: Home              Anticipated d/c is to: Home              Anticipated d/c date is: 1 day              Patient currently is not medically stable to d/c.   Patient still symptomatic, abdominal pain and possible diarrhea.  Continue to suspect viral gastroenteritis as primary differential however difficult to exclude bacterial etiology at this time.  Will complete last dose of ciprofloxacin today.  No further antibiotics necessary if patient remains afebrile clears  her white count by tomorrow.  Anticipate discharge within 24 hours.   Consultants:   None  Procedures:   None  Antimicrobials:    Ciprofloxacin     Subjective: Patient seen and examined.  Anxiety appears improved today.  Abdominal pain also improved.  Patient continues to endorse diarrhea and frequent stools.  No fevers over interval.   Objective: Vitals:   09/30/20 2354 10/01/20 0449 10/01/20 0745 10/01/20 1244  BP: 100/60 115/60 109/72 (!) 111/58  Pulse: 76 88 73 69  Resp: 17 18 16 16   Temp: 98.4 F (36.9 C) 98.3 F (36.8 C) 97.8 F (36.6 C) 97.6 F (36.4 C)  TempSrc:   Oral Oral  SpO2: 95% 98% 98% 95%  Weight:      Height:        Intake/Output Summary (Last 24 hours) at 10/01/2020 1251 Last data filed at 09/30/2020 1500 Gross per 24 hour  Intake 1703.99 ml  Output --  Net 1703.99 ml   Filed Weights   09/29/20 1646  Weight: 95.3 kg    Examination:  General exam: Anxious Respiratory system: Normal work of breathing.  Room air.  Clear lungs Cardiovascular system: S1-S2, regular rate and rhythm, no murmurs  gastrointestinal system: Obese, nondistended, tender to palpation epigastrium  Central nervous system: Alert and oriented. No focal neurological deficits. Extremities: Symmetric 5 x 5 power. Skin: No rashes, lesions or ulcers Psychiatry: Judgement and insight appear impaired. Mood & affect depressed, anxious.     Data Reviewed: I have personally reviewed following labs and imaging studies  CBC: Recent Labs  Lab 09/29/20 1651 09/30/20 0439 10/01/20 0848  WBC 13.5* 12.5* 5.6  NEUTROABS  --   --  3.9  HGB 13.1 10.9* 10.1*  HCT 40.1 33.6* 30.7*  MCV 82.0 83.0 82.3  PLT 299 202 469*   Basic Metabolic Panel: Recent Labs  Lab 09/29/20 1651 09/30/20 0439 10/01/20 0848  NA 137 138 135  K 3.5 3.4* 3.1*  CL 105 104 105  CO2 18* 24 20*  GLUCOSE 243* 229* 199*  BUN 16 12 7   CREATININE 1.21* 1.14* 1.10*  CALCIUM 9.3 9.0 8.6*  MG 2.1  --  1.7   GFR: Estimated Creatinine Clearance: 67.7 mL/min (A) (by C-G formula based on SCr of 1.1 mg/dL (H)). Liver Function  Tests: Recent Labs  Lab 09/29/20 1651  AST 14*  ALT 10  ALKPHOS 107  BILITOT 0.7  PROT 7.7  ALBUMIN 4.2   Recent Labs  Lab 09/29/20 1651  LIPASE 23   No results for input(s): AMMONIA in the last 168 hours. Coagulation Profile: No results for input(s): INR, PROTIME in the last 168 hours. Cardiac Enzymes: No results for input(s): CKTOTAL, CKMB, CKMBINDEX, TROPONINI in the last 168 hours. BNP (last 3 results) No results for input(s): PROBNP in the last 8760 hours. HbA1C: Recent Labs    09/29/20 1656  HGBA1C 10.3*   CBG: Recent Labs  Lab 09/30/20 0806 09/30/20 1124 09/30/20 1602 09/30/20 2106 10/01/20 1240  GLUCAP 178* 167* 135* 128* 149*   Lipid Profile: No results for input(s): CHOL, HDL, LDLCALC, TRIG, CHOLHDL, LDLDIRECT in the last 72 hours. Thyroid Function Tests: No results for input(s): TSH, T4TOTAL, FREET4, T3FREE, THYROIDAB in the last 72 hours. Anemia Panel: No results for input(s): VITAMINB12, FOLATE, FERRITIN, TIBC, IRON, RETICCTPCT in the last 72 hours. Sepsis Labs: No results for input(s): PROCALCITON, LATICACIDVEN in the last 168 hours.  Recent Results (from the past 240 hour(s))  Gastrointestinal Panel by  PCR , Stool     Status: None   Collection Time: 09/29/20  5:11 PM   Specimen: Stool  Result Value Ref Range Status   Campylobacter species NOT DETECTED NOT DETECTED Final   Plesimonas shigelloides NOT DETECTED NOT DETECTED Final   Salmonella species NOT DETECTED NOT DETECTED Final   Yersinia enterocolitica NOT DETECTED NOT DETECTED Final   Vibrio species NOT DETECTED NOT DETECTED Final   Vibrio cholerae NOT DETECTED NOT DETECTED Final   Enteroaggregative E coli (EAEC) NOT DETECTED NOT DETECTED Final   Enteropathogenic E coli (EPEC) NOT DETECTED NOT DETECTED Final   Enterotoxigenic E coli (ETEC) NOT DETECTED NOT DETECTED Final   Shiga like toxin producing E coli (STEC) NOT DETECTED NOT DETECTED Final   Shigella/Enteroinvasive E coli (EIEC)  NOT DETECTED NOT DETECTED Final   Cryptosporidium NOT DETECTED NOT DETECTED Final   Cyclospora cayetanensis NOT DETECTED NOT DETECTED Final   Entamoeba histolytica NOT DETECTED NOT DETECTED Final   Giardia lamblia NOT DETECTED NOT DETECTED Final   Adenovirus F40/41 NOT DETECTED NOT DETECTED Final   Astrovirus NOT DETECTED NOT DETECTED Final   Norovirus GI/GII NOT DETECTED NOT DETECTED Final   Rotavirus A NOT DETECTED NOT DETECTED Final   Sapovirus (I, II, IV, and V) NOT DETECTED NOT DETECTED Final    Comment: Performed at [facility], [address] [place], [place] [address]  C Difficile Quick Screen w PCR reflex     Status: None   Collection Time: 09/29/20  8:13 PM   Specimen: STOOL  Result Value Ref Range Status   C Diff antigen NEGATIVE NEGATIVE Final   C Diff toxin NEGATIVE NEGATIVE Final   C Diff interpretation No C. difficile detected.  Final    Comment: Performed at [facility], [address] [place],  [address]  Respiratory Panel by RT PCR (Flu A&B, Covid) - Nasopharyngeal Swab     Status: None   Collection Time: 09/29/20  8:13 PM   Specimen: Nasopharyngeal Swab  Result Value Ref Range Status   SARS Coronavirus 2 by RT PCR NEGATIVE NEGATIVE Final    Comment: (NOTE) SARS-CoV-2 target nucleic acids are NOT DETECTED.  The SARS-CoV-2 RNA is generally detectable in upper respiratoy specimens during the acute phase of infection. The lowest concentration of SARS-CoV-2 viral copies this assay can detect is 131 copies/mL. A negative result does not preclude SARS-Cov-2 infection and should not be used as the sole basis for treatment or other patient management decisions. A negative result may occur with  improper specimen collection/handling, submission of specimen other than nasopharyngeal swab, presence of viral mutation(s) within the areas targeted by this assay, and inadequate number of viral copies (<131 copies/mL). A negative result must be  combined with clinical observations, patient history, and epidemiological information. The expected result is Negative.  Fact Sheet for Patients:  PinkCheek.be  Fact Sheet for Healthcare Providers:  GravelBags.it  This test is no t yet approved or cleared by the [place] FDA and  has been authorized for detection and/or diagnosis of SARS-CoV-2 by FDA under an Emergency Use Authorization (EUA). This EUA will remain  in effect (meaning this test can be used) for the duration of the COVID-19 declaration under Section 564(b)(1) of the Act, 21 U.S.C. section 360bbb-3(b)(1), unless the authorization is terminated or revoked sooner.     Influenza A by PCR NEGATIVE NEGATIVE Final   Influenza B by PCR NEGATIVE NEGATIVE Final    Comment: (NOTE) The Xpert Xpress  SARS-CoV-2/FLU/RSV assay is intended as an aid in  the diagnosis of influenza from Nasopharyngeal swab specimens and  should not be used as a sole basis for treatment. Nasal washings and  aspirates are unacceptable for Xpert Xpress SARS-CoV-2/FLU/RSV  testing.  Fact Sheet for Patients: PinkCheek.be  Fact Sheet for Healthcare Providers: GravelBags.it  This test is not yet approved or cleared by the [place] FDA and  has been authorized for detection and/or diagnosis of SARS-CoV-2 by  FDA under an Emergency Use Authorization (EUA). This EUA will remain  in effect (meaning this test can be used) for the duration of the  Covid-19 declaration under Section 564(b)(1) of the Act, 21  U.S.C. section 360bbb-3(b)(1), unless the authorization is  terminated or revoked. Performed at [facility], [address], [place], [place] [address]          Radiology Studies: DG Chest 2 View  Result Date: 09/29/2020 CLINICAL DATA:  Shortness of breath EXAM: CHEST - 2 VIEW COMPARISON:  09/28/2017 FINDINGS:  The heart size and mediastinal contours are within normal limits. Both lungs are clear. The visualized skeletal structures are unremarkable. IMPRESSION: Normal study. Electronically Signed   By: Rolm Baptise M.D.   On: 09/29/2020 19:53   CT ABDOMEN PELVIS W CONTRAST  Result Date: 09/29/2020 CLINICAL DATA:  54 year old female with abdominal pain. EXAM: CT ABDOMEN AND PELVIS WITH CONTRAST TECHNIQUE: Multidetector CT imaging of the abdomen and pelvis was performed using the standard protocol following bolus administration of intravenous contrast. CONTRAST:  14mL OMNIPAQUE IOHEXOL 300 MG/ML  SOLN COMPARISON:  CT abdomen pelvis dated 02/11/2015. FINDINGS: Lower chest: The visualized lung bases are clear. No intra-abdominal free air. Trace free fluid in the pelvis. Hepatobiliary: Probable mild fatty liver. No intrahepatic biliary dilatation. Cholecystectomy. No retained calcified stone noted in the central CBD. Pancreas: Unremarkable. No pancreatic ductal dilatation or surrounding inflammatory changes. Spleen: Normal in size without focal abnormality. Adrenals/Urinary Tract: The adrenal glands unremarkable. The left kidney is unremarkable. There is severe right hydronephrosis with severe right renal parenchyma atrophy, likely secondary to longstanding hydronephrosis. There is a transition at the right ureteropelvic junction. No calcified stone identified. Findings concerning for a stricture at the right UPJ. A urothelial lesion or neoplasm is not excluded. Clinical correlation and further evaluation with direct visualization recommended. The urinary bladder is collapsed. Stomach/Bowel: There is long segment inflammatory changes and thickening of the distal and terminal ileum consistent with enteritis. Findings may be infectious in etiology, although an inflammatory bowel disease is not excluded clinical correlation is recommended. There is no bowel obstruction. The appendix is normal. Vascular/Lymphatic: The  abdominal aorta and IVC unremarkable. No portal venous gas. There is no adenopathy. Reproductive: Hysterectomy. No adnexal masses. Other: None Musculoskeletal: No acute or significant osseous findings. Right L5 pars defect. No listhesis. IMPRESSION: 1. Enteritis of the distal small bowel. No bowel obstruction. Normal appendix. 2. Severe right hydronephrosis concerning for a stricture at the right UPJ. A urothelial lesion is not excluded. Clinical correlation and further evaluation with direct visualization recommended. Electronically Signed   By: Anner Crete M.D.   On: 09/29/2020 20:37        Scheduled Meds: . acidophilus  1 capsule Oral Daily  . calcium carbonate  3 tablet Oral TID  . clonazePAM  1 mg Oral BID  . dicyclomine  10 mg Oral TID AC & HS  . DULoxetine  120 mg Oral Daily  . enoxaparin (LOVENOX) injection  50 mg Subcutaneous Q24H  .  influenza vac split quadrivalent PF  0.5 mL Intramuscular Tomorrow-1000  . insulin aspart  0-9 Units Subcutaneous TID PC & HS  . metoprolol tartrate  25 mg Oral BID  . [START ON 10/02/2020] pantoprazole (PROTONIX) IV  40 mg Intravenous Q24H  . prazosin  4 mg Oral QHS  . QUEtiapine  100 mg Oral QHS   Continuous Infusions: . sodium chloride 100 mL/hr at 09/30/20 1500  . ciprofloxacin 400 mg (10/01/20 0746)     LOS: 1 day    Time spent: 25 minutes    Sidney Ace, MD [facility] Pager [phone]  If 7PM-7AM, please contact night-coverage 10/01/2020, 12:51 PM

## 2020-10-02 DIAGNOSIS — K529 Noninfective gastroenteritis and colitis, unspecified: Secondary | ICD-10-CM | POA: Diagnosis not present

## 2020-10-02 LAB — CBC WITH DIFFERENTIAL/PLATELET
Abs Immature Granulocytes: 0.01 10*3/uL (ref 0.00–0.07)
Basophils Absolute: 0 10*3/uL (ref 0.0–0.1)
Basophils Relative: 1 %
Eosinophils Absolute: 0.2 10*3/uL (ref 0.0–0.5)
Eosinophils Relative: 5 %
HCT: 27.9 % — ABNORMAL LOW (ref 36.0–46.0)
Hemoglobin: 9.3 g/dL — ABNORMAL LOW (ref 12.0–15.0)
Immature Granulocytes: 0 %
Lymphocytes Relative: 34 %
Lymphs Abs: 1.6 10*3/uL (ref 0.7–4.0)
MCH: 27.5 pg (ref 26.0–34.0)
MCHC: 33.3 g/dL (ref 30.0–36.0)
MCV: 82.5 fL (ref 80.0–100.0)
Monocytes Absolute: 0.3 10*3/uL (ref 0.1–1.0)
Monocytes Relative: 5 %
Neutro Abs: 2.6 10*3/uL (ref 1.7–7.7)
Neutrophils Relative %: 55 %
Platelets: 148 10*3/uL — ABNORMAL LOW (ref 150–400)
RBC: 3.38 MIL/uL — ABNORMAL LOW (ref 3.87–5.11)
RDW: 13.7 % (ref 11.5–15.5)
WBC: 4.6 10*3/uL (ref 4.0–10.5)
nRBC: 0 % (ref 0.0–0.2)

## 2020-10-02 LAB — BASIC METABOLIC PANEL
Anion gap: 8 (ref 5–15)
BUN: 6 mg/dL (ref 6–20)
CO2: 23 mmol/L (ref 22–32)
Calcium: 8.5 mg/dL — ABNORMAL LOW (ref 8.9–10.3)
Chloride: 107 mmol/L (ref 98–111)
Creatinine, Ser: 1.05 mg/dL — ABNORMAL HIGH (ref 0.44–1.00)
GFR, Estimated: >60 mL/min (ref >60)
Glucose, Bld: 151 mg/dL — ABNORMAL HIGH (ref 70–99)
Potassium: 3.2 mmol/L — ABNORMAL LOW (ref 3.5–5.1)
Sodium: 138 mmol/L (ref 135–145)

## 2020-10-02 LAB — GLUCOSE, CAPILLARY
Glucose-Capillary: 124 mg/dL — ABNORMAL HIGH (ref 70–99)
Glucose-Capillary: 185 mg/dL — ABNORMAL HIGH (ref 70–99)
Glucose-Capillary: 203 mg/dL — ABNORMAL HIGH (ref 70–99)
Glucose-Capillary: 130 mg/dL — ABNORMAL HIGH (ref 70–99)

## 2020-10-02 LAB — MAGNESIUM: Magnesium: 1.8 mg/dL (ref 1.7–2.4)

## 2020-10-02 MED ORDER — POTASSIUM CHLORIDE CRYS ER 20 MEQ PO TBCR
40.0000 meq | EXTENDED_RELEASE_TABLET | Freq: Once | ORAL | Status: AC
  Administered 2020-10-02: 10:00:00 40 meq via ORAL
  Filled 2020-10-02: qty 2

## 2020-10-02 MED ORDER — PANTOPRAZOLE SODIUM 40 MG PO TBEC
40.0000 mg | DELAYED_RELEASE_TABLET | Freq: Every day | ORAL | Status: DC
  Administered 2020-10-03: 09:00:00 40 mg via ORAL
  Filled 2020-10-02: qty 1

## 2020-10-02 NOTE — Progress Notes (Signed)
PROGRESS NOTE    Deanna George  DDU:[id number] DOB: [birth year] DOA: 09/29/2020 PCP: [name], MD   Brief Narrative:  Caucasian female with a known history of multiple medical problems including hypertension, GERD, C. difficile infection and esophageal stricture, who presented to the emergency room with acute onset of intractable nausea and vomiting with diarrhea for the last couple of days.  She was treated on admission with Bentyl, Dilaudid, Zofran, intravenous fluids with para good result.  CT abdomen pelvis demonstrated distal enteritis, normal appendix.  Also noted to have chronic right hydronephrosis with UPJ stricture.  Per the patient this was known from 2016.  She states she had a urology evaluation at that time though I cannot find record in care everywhere. 11/18-patient very anxious.  Lying unclothed in bed.  Tremulous.  Endorses severe nausea, diarrhea, abdominal pain.  Vital signs stable.  Patient afebrile 11/19-patient's anxiety seems better controlled today.  GI PCR and C. difficile negative.  Patient endorses frequent episodes of diarrhea however per nursing these are soft stools and not indicative of true diarrhea.   11/20: Continues to endorse diarrhea    Assessment & Plan:   Active Problems:   Gastroenteritis  Acute gastroenteritis Intractable nausea and vomiting Per CT abdomen pelvis small bowel enteritis Suspect infectious etiology Likely viral Improving over interval Plan: Continue IV fluids Antiemetics as needed Pain control as needed.  Avoid narcotics if able DC ciprofloxacin DC Flagyl Stool studies negative, enteric precautions discontinued Monitor bowel movements.  If improved can discharge tomorrow  Right hydronephrosis, concern for UPJ stricture Acute kidney injury kidney function has been improving Suspect secondary to volume depletion and dehydration Patient is aware of right hydro and UPJ stricture.  States that he been worked up by urology  in the past Plan: Intravenous fluids as above Avoid nephrotoxic agents Daily BMP As creatinine is improving can defer urology evaluation to outpatient  Syncope/near syncope Patient is unclear about the circumstances surrounding this event Telemetry discontinued Orthostatics negative  Hyperglycemia New diagnosis diabetes Patient is not a known diabetic Hemoglobin A1c 10.3 indicating underlying undiagnosed diabetes Long conversation with patient at diagnosis.  Encouraged outpatient PCP follow-up Plan: Continue NovoLog coverage Diabetic coordinator consult Carb modified diet when able to tolerate p.o.  Anxiety Depression Anxiety appears to be improved Patient is tremulous or nervous appearing She was lying in bed totally unclothed on my evaluation 09/30/2020 Abdominal pain also improved Patient is on a rather aggressive psychiatric regimen at home Her psychiatric symptoms appear to be improving Plan: Continue home regimen of Cymbalta, Klonopin, Seroquel As needed Valium If anxiety remains an issue will involve psychiatry    DVT prophylaxis: Lovenox Code Status: Full  Family Communication: Daughter Starlynn Klinkner [phone] on 10/01/2020 Disposition Plan: Status is: Inpatient  Remains inpatient appropriate because:Inpatient level of care appropriate due to severity of illness   Dispo: The patient is from: Home              Anticipated d/c is to: Home              Anticipated d/c date is: 1 day              Patient currently is not medically stable to d/c.   Still having some symptoms associated with enteritis, mainly diarrhea and abdominal pain.  Anticipate she will continue to improve.  Projected discharge home 10/03/2020  Consultants:   None  Procedures:   None  Antimicrobials:        Subjective:  Patient seen and examined.  Continues to endorse diarrhea.  Abdominal pain improved.  Objective: Vitals:   10/01/20 2011 10/02/20 0016 10/02/20 0327  10/02/20 0819  BP: 124/76 101/65 105/70 114/72  Pulse: 73 65 70 64  Resp: 20 18 15 18   Temp: 97.7 F (36.5 C) 98.4 F (36.9 C) 97.7 F (36.5 C) 98 F (36.7 C)  TempSrc: Oral Oral Oral Oral  SpO2: 98% 100% 96% 96%  Weight:      Height:       No intake or output data in the 24 hours ending 10/02/20 1055 Filed Weights   09/29/20 1646  Weight: 95.3 kg    Examination:  General exam: No acute distress Respiratory system: Normal work of breathing.  Room air.  Clear lungs Cardiovascular system: S1-S2, regular rate and rhythm, no murmurs  gastrointestinal system: Obese, nondistended, tender to palpation epigastrium  Central nervous system: Alert and oriented. No focal neurological deficits. Extremities: Symmetric 5 x 5 power. Skin: No rashes, lesions or ulcers Psychiatry: Judgement and insight appear impaired. Mood & affect depressed, anxious.     Data Reviewed: I have personally reviewed following labs and imaging studies  CBC: Recent Labs  Lab 09/29/20 1651 09/30/20 0439 10/01/20 0848 10/02/20 0525  WBC 13.5* 12.5* 5.6 4.6  NEUTROABS  --   --  3.9 2.6  HGB 13.1 10.9* 10.1* 9.3*  HCT 40.1 33.6* 30.7* 27.9*  MCV 82.0 83.0 82.3 82.5  PLT 299 202 129* 335*   Basic Metabolic Panel: Recent Labs  Lab 09/29/20 1651 09/30/20 0439 10/01/20 0848 10/02/20 0525  NA 137 138 135 138  K 3.5 3.4* 3.1* 3.2*  CL 105 104 105 107  CO2 18* 24 20* 23  GLUCOSE 243* 229* 199* 151*  BUN 16 12 7 6   CREATININE 1.21* 1.14* 1.10* 1.05*  CALCIUM 9.3 9.0 8.6* 8.5*  MG 2.1  --  1.7 1.8   GFR: Estimated Creatinine Clearance: 70.9 mL/min (A) (by C-G formula based on SCr of 1.05 mg/dL (H)). Liver Function Tests: Recent Labs  Lab 09/29/20 1651  AST 14*  ALT 10  ALKPHOS 107  BILITOT 0.7  PROT 7.7  ALBUMIN 4.2   Recent Labs  Lab 09/29/20 1651  LIPASE 23   No results for input(s): AMMONIA in the last 168 hours. Coagulation Profile: No results for input(s): INR, PROTIME in the  last 168 hours. Cardiac Enzymes: No results for input(s): CKTOTAL, CKMB, CKMBINDEX, TROPONINI in the last 168 hours. BNP (last 3 results) No results for input(s): PROBNP in the last 8760 hours. HbA1C: Recent Labs    09/29/20 1656  HGBA1C 10.3*   CBG: Recent Labs  Lab 09/30/20 2106 10/01/20 1240 10/01/20 1644 10/01/20 2112 10/02/20 0830  GLUCAP 128* 149* 122* 146* 124*   Lipid Profile: No results for input(s): CHOL, HDL, LDLCALC, TRIG, CHOLHDL, LDLDIRECT in the last 72 hours. Thyroid Function Tests: No results for input(s): TSH, T4TOTAL, FREET4, T3FREE, THYROIDAB in the last 72 hours. Anemia Panel: No results for input(s): VITAMINB12, FOLATE, FERRITIN, TIBC, IRON, RETICCTPCT in the last 72 hours. Sepsis Labs: No results for input(s): PROCALCITON, LATICACIDVEN in the last 168 hours.  Recent Results (from the past 240 hour(s))  Gastrointestinal Panel by PCR , Stool     Status: None   Collection Time: 09/29/20  5:11 PM   Specimen: Stool  Result Value Ref Range Status   Campylobacter species NOT DETECTED NOT DETECTED Final   Plesimonas shigelloides NOT DETECTED NOT DETECTED Final   Salmonella  species NOT DETECTED NOT DETECTED Final   Yersinia enterocolitica NOT DETECTED NOT DETECTED Final   Vibrio species NOT DETECTED NOT DETECTED Final   Vibrio cholerae NOT DETECTED NOT DETECTED Final   Enteroaggregative E coli (EAEC) NOT DETECTED NOT DETECTED Final   Enteropathogenic E coli (EPEC) NOT DETECTED NOT DETECTED Final   Enterotoxigenic E coli (ETEC) NOT DETECTED NOT DETECTED Final   Shiga like toxin producing E coli (STEC) NOT DETECTED NOT DETECTED Final   Shigella/Enteroinvasive E coli (EIEC) NOT DETECTED NOT DETECTED Final   Cryptosporidium NOT DETECTED NOT DETECTED Final   Cyclospora cayetanensis NOT DETECTED NOT DETECTED Final   Entamoeba histolytica NOT DETECTED NOT DETECTED Final   Giardia lamblia NOT DETECTED NOT DETECTED Final   Adenovirus F40/41 NOT DETECTED NOT  DETECTED Final   Astrovirus NOT DETECTED NOT DETECTED Final   Norovirus GI/GII NOT DETECTED NOT DETECTED Final   Rotavirus A NOT DETECTED NOT DETECTED Final   Sapovirus (I, II, IV, and V) NOT DETECTED NOT DETECTED Final    Comment: Performed at [facility], [address] [place], [place] [address]  C Difficile Quick Screen w PCR reflex     Status: None   Collection Time: 09/29/20  8:13 PM   Specimen: STOOL  Result Value Ref Range Status   C Diff antigen NEGATIVE NEGATIVE Final   C Diff toxin NEGATIVE NEGATIVE Final   C Diff interpretation No C. difficile detected.  Final    Comment: Performed at [facility], [address] [place], [place] [address]  Respiratory Panel by RT PCR (Flu A&B, Covid) - Nasopharyngeal Swab     Status: None   Collection Time: 09/29/20  8:13 PM   Specimen: Nasopharyngeal Swab  Result Value Ref Range Status   SARS Coronavirus 2 by RT PCR NEGATIVE NEGATIVE Final    Comment: (NOTE) SARS-CoV-2 target nucleic acids are NOT DETECTED.  The SARS-CoV-2 RNA is generally detectable in upper respiratoy specimens during the acute phase of infection. The lowest concentration of SARS-CoV-2 viral copies this assay can detect is 131 copies/mL. A negative result does not preclude SARS-Cov-2 infection and should not be used as the sole basis for treatment or other patient management decisions. A negative result may occur with  improper specimen collection/handling, submission of specimen other than nasopharyngeal swab, presence of viral mutation(s) within the areas targeted by this assay, and inadequate number of viral copies (<131 copies/mL). A negative result must be combined with clinical observations, patient history, and epidemiological information. The expected result is Negative.  Fact Sheet for Patients:  PinkCheek.be  Fact Sheet for Healthcare Providers:  GravelBags.it  This test  is no t yet approved or cleared by the [place] FDA and  has been authorized for detection and/or diagnosis of SARS-CoV-2 by FDA under an Emergency Use Authorization (EUA). This EUA will remain  in effect (meaning this test can be used) for the duration of the COVID-19 declaration under Section 564(b)(1) of the Act, 21 U.S.C. section 360bbb-3(b)(1), unless the authorization is terminated or revoked sooner.     Influenza A by PCR NEGATIVE NEGATIVE Final   Influenza B by PCR NEGATIVE NEGATIVE Final    Comment: (NOTE) The Xpert Xpress SARS-CoV-2/FLU/RSV assay is intended as an aid in  the diagnosis of influenza from Nasopharyngeal swab specimens and  should not be used as a sole basis for treatment. Nasal washings and  aspirates are unacceptable for Xpert Xpress SARS-CoV-2/FLU/RSV  testing.  Fact Sheet for Patients: PinkCheek.be  Fact Sheet for Healthcare Providers: GravelBags.it  This test is not yet approved or cleared by the [place] FDA and  has been authorized for detection and/or diagnosis of SARS-CoV-2 by  FDA under an Emergency Use Authorization (EUA). This EUA will remain  in effect (meaning this test can be used) for the duration of the  Covid-19 declaration under Section 564(b)(1) of the Act, 21  U.S.C. section 360bbb-3(b)(1), unless the authorization is  terminated or revoked. Performed at [facility], [address] [place], [place] [address]          Radiology Studies: No results found.      Scheduled Meds: . acidophilus  1 capsule Oral Daily  . calcium carbonate  3 tablet Oral TID  . clonazePAM  1 mg Oral BID  . dicyclomine  10 mg Oral TID AC & HS  . DULoxetine  120 mg Oral Daily  . enoxaparin (LOVENOX) injection  50 mg Subcutaneous Q24H  . influenza vac split quadrivalent PF  0.5 mL Intramuscular Tomorrow-1000  . insulin aspart  0-9 Units Subcutaneous TID PC & HS  .  metoprolol tartrate  25 mg Oral BID  . pantoprazole (PROTONIX) IV  40 mg Intravenous Q24H  . prazosin  4 mg Oral QHS  . QUEtiapine  100 mg Oral QHS   Continuous Infusions: . sodium chloride 100 mL/hr at 10/01/20 2042     LOS: 2 days    Time spent: 15 minutes    Sidney Ace, MD [facility] Pager [phone]  If 7PM-7AM, please contact night-coverage 10/02/2020, 10:55 AM

## 2020-10-02 NOTE — Plan of Care (Signed)
  Problem: Education: °Goal: Knowledge of General Education information will improve °Description: Including pain rating scale, medication(s)/side effects and non-pharmacologic comfort measures °Outcome: Progressing °  °Problem: Health Behavior/Discharge Planning: °Goal: Ability to manage health-related needs will improve °Outcome: Progressing °  °Problem: Clinical Measurements: °Goal: Ability to maintain clinical measurements within normal limits will improve °Outcome: Progressing °  °Problem: Clinical Measurements: °Goal: Will remain free from infection °Outcome: Progressing °  °Problem: Clinical Measurements: °Goal: Diagnostic test results will improve °Outcome: Progressing °  °Problem: Clinical Measurements: °Goal: Respiratory complications will improve °Outcome: Progressing °  °Problem: Activity: °Goal: Risk for activity intolerance will decrease °Outcome: Progressing °  °Problem: Pain Managment: °Goal: General experience of comfort will improve °Outcome: Progressing °  °

## 2020-10-03 DIAGNOSIS — K529 Noninfective gastroenteritis and colitis, unspecified: Secondary | ICD-10-CM | POA: Diagnosis not present

## 2020-10-03 LAB — CBC WITH DIFFERENTIAL/PLATELET
Abs Immature Granulocytes: 0.02 10*3/uL (ref 0.00–0.07)
Basophils Absolute: 0 10*3/uL (ref 0.0–0.1)
Basophils Relative: 0 %
Eosinophils Absolute: 0.2 10*3/uL (ref 0.0–0.5)
Eosinophils Relative: 3 %
HCT: 28.9 % — ABNORMAL LOW (ref 36.0–46.0)
Hemoglobin: 9.6 g/dL — ABNORMAL LOW (ref 12.0–15.0)
Immature Granulocytes: 0 %
Lymphocytes Relative: 31 %
Lymphs Abs: 1.7 10*3/uL (ref 0.7–4.0)
MCH: 27.3 pg (ref 26.0–34.0)
MCHC: 33.2 g/dL (ref 30.0–36.0)
MCV: 82.1 fL (ref 80.0–100.0)
Monocytes Absolute: 0.3 10*3/uL (ref 0.1–1.0)
Monocytes Relative: 5 %
Neutro Abs: 3.3 10*3/uL (ref 1.7–7.7)
Neutrophils Relative %: 61 %
Platelets: 141 10*3/uL — ABNORMAL LOW (ref 150–400)
RBC: 3.52 MIL/uL — ABNORMAL LOW (ref 3.87–5.11)
RDW: 13.5 % (ref 11.5–15.5)
WBC: 5.5 10*3/uL (ref 4.0–10.5)
nRBC: 0 % (ref 0.0–0.2)

## 2020-10-03 LAB — BASIC METABOLIC PANEL
Anion gap: 5 (ref 5–15)
BUN: <5 mg/dL — ABNORMAL LOW (ref 6–20)
CO2: 22 mmol/L (ref 22–32)
Calcium: 8.5 mg/dL — ABNORMAL LOW (ref 8.9–10.3)
Chloride: 111 mmol/L (ref 98–111)
Creatinine, Ser: 1.02 mg/dL — ABNORMAL HIGH (ref 0.44–1.00)
GFR, Estimated: >60 mL/min (ref >60)
Glucose, Bld: 146 mg/dL — ABNORMAL HIGH (ref 70–99)
Potassium: 3.6 mmol/L (ref 3.5–5.1)
Sodium: 138 mmol/L (ref 135–145)

## 2020-10-03 LAB — MAGNESIUM: Magnesium: 1.8 mg/dL (ref 1.7–2.4)

## 2020-10-03 LAB — GLUCOSE, CAPILLARY
Glucose-Capillary: 112 mg/dL — ABNORMAL HIGH (ref 70–99)
Glucose-Capillary: 175 mg/dL — ABNORMAL HIGH (ref 70–99)

## 2020-10-03 MED ORDER — MAGNESIUM SULFATE 2 GM/50ML IV SOLN
2.0000 g | Freq: Once | INTRAVENOUS | Status: AC
  Administered 2020-10-03: 09:00:00 2 g via INTRAVENOUS
  Filled 2020-10-03: qty 50

## 2020-10-03 MED ORDER — DICYCLOMINE HCL 10 MG PO CAPS: 10.0000 mg | ORAL_CAPSULE | Freq: Three times a day (TID) | ORAL | 0 refills | Status: AC

## 2020-10-03 MED ORDER — FLUTICASONE PROPIONATE 50 MCG/ACT NA SUSP: 2.0000 | Freq: Every day | NASAL | 0 refills | Status: AC

## 2020-10-03 MED ORDER — LOPERAMIDE HCL 2 MG PO CAPS: 2.0000 mg | ORAL_CAPSULE | ORAL | 0 refills | Status: AC | PRN

## 2020-10-03 MED ORDER — POTASSIUM CHLORIDE CRYS ER 20 MEQ PO TBCR
40.0000 meq | EXTENDED_RELEASE_TABLET | Freq: Once | ORAL | Status: AC
  Administered 2020-10-03: 40 meq via ORAL
  Filled 2020-10-03: qty 2

## 2020-10-03 MED ORDER — FLUTICASONE PROPIONATE 50 MCG/ACT NA SUSP
2.0000 | Freq: Every day | NASAL | Status: DC
  Filled 2020-10-03: qty 16

## 2020-10-03 MED ORDER — ONDANSETRON HCL 4 MG PO TABS
4.0000 mg | ORAL_TABLET | Freq: Four times a day (QID) | ORAL | 0 refills | Status: DC | PRN
Start: 2020-10-03 — End: 2024-06-11

## 2020-10-03 NOTE — Discharge Summary (Signed)
Physician Discharge Summary  Deanna George VQM:[id number] DOB: December 28, 1965 DOA: 09/29/2020  PCP: [name], MD  Admit date: 09/29/2020 Discharge date: 10/03/2020  Admitted From: Home Disposition: Home Recommendations for Outpatient Follow-up:  1. Follow up with PCP in 1-2 weeks 2.   Home Health:No Equipment/Devices:None Discharge Condition:Stable CODE STATUS:Full Diet recommendation: Heart Healthy / Carb Modified  Brief/Interim Summary: Caucasian femalewith a known history ofmultiple medical problems including hypertension, GERD, C. difficile infection and esophageal stricture, who presented to the emergency room with acute onset of intractable nausea and vomiting with diarrhea for the last couple of days.  She was treated on admission with Bentyl, Dilaudid, Zofran, intravenous fluids with para good result.  CT abdomen pelvis demonstrated distal enteritis, normal appendix.  Also noted to have chronic right hydronephrosis with UPJ stricture.  Per the patient this was known from 2016.  She states she had a urology evaluation at that time though I cannot find record in care everywhere. 11/18-patient very anxious.  Lying unclothed in bed.  Tremulous.  Endorses severe nausea, diarrhea, abdominal pain.  Vital signs stable.  Patient afebrile 11/19-patient's anxiety seems better controlled today.  GI PCR and C. difficile negative.  Patient endorses frequent episodes of diarrhea however per nursing these are soft stools and not indicative of true diarrhea.   11/20: Continues to endorse diarrhea 11/21: Diarrhea resolved.  Patient remains afebrile.  Suspect viral gastroenteritis.  Patient stable for discharge home at this time.  Long conversation with the patient at bedside as well as her daughter via phone about new diagnosis of diabetes.  Will not start any antidiabetic agents at this time.  Strongly recommend primary care visit within 1 to 2 weeks following discharge.  Discharge Diagnoses:   Active Problems:   Gastroenteritis  Acute gastroenteritis Intractable nausea and vomiting Per CT abdomen pelvis small bowel enteritis Suspect infectious etiology Likely viral Improving over interval Empiric antibiotics stopped Stool studies negative Diarrhea resolved Stable for discharge home at this time As needed Bentyl and Zofran prescribed on discharge  Right hydronephrosis, concern for UPJ stricture Acute kidney injury kidney function has been improving Suspect secondary to volume depletion and dehydration Patient is aware of right hydro and UPJ stricture.   States that he been worked up by urology in the past Recommend referral back to urology to assess stricture and question need for further diagnostics or intervention  Syncope/near syncope Patient is unclear about the circumstances surrounding this event Telemetry discontinued Orthostatics negative  Hyperglycemia New diagnosis diabetes Patient is not a known diabetic Hemoglobin A1c 10.3 indicating underlying undiagnosed diabetes Long conversation with patient at diagnosis.  Encouraged outpatient PCP follow-up We will not start any antidiabetic agents at this time Strongly recommend PCP visit Discussed with patient and daughter Diabetic diet instructions included in discharge packet  Anxiety Depression Anxiety appears to be improved Patient is tremulous or nervous appearing She was lying in bed totally unclothed on my evaluation 09/30/2020 Abdominal pain also improved Patient is on a rather aggressive psychiatric regimen at home Her psychiatric symptoms appear to be improving Plan: Continue home regimen of Cymbalta, Klonopin, Seroquel As needed Valium Outpatient psychiatric follow-up  Discharge Instructions  Discharge Instructions    Diet - low sodium heart healthy   Complete by: As directed    Soft bland diet, frequent small meals   Increase activity slowly   Complete by: As directed       Allergies as of 10/03/2020   No Known Allergies     Medication  List    TAKE these medications   clonazePAM 1 MG tablet Commonly known as: KlonoPIN Take 1 tablet (1 mg total) by mouth 2 (two) times daily.   cyclobenzaprine 10 MG tablet Commonly known as: FLEXERIL TAKE 1 TABLET BY MOUTH EVERY 8 HOURS AS NEEDED FOR 30 DAYS   diazepam 5 MG tablet Commonly known as: VALIUM Take 5 mg by mouth every 6 (six) hours as needed for anxiety.   dicyclomine 10 MG capsule Commonly known as: BENTYL Take 1 capsule (10 mg total) by mouth 4 (four) times daily -  before meals and at bedtime for 7 days.   diphenhydrAMINE 25 mg capsule Commonly known as: BENADRYL Take 25 mg by mouth every 6 (six) hours as needed for allergies.   DULoxetine 60 MG capsule Commonly known as: CYMBALTA Take 2 capsules (120 mg total) by mouth daily.   fluticasone 50 MCG/ACT nasal spray Commonly known as: FLONASE Place 2 sprays into both nostrils daily for 7 days.   ibuprofen 800 MG tablet Commonly known as: ADVIL 1 tablet with food or milk as needed   loperamide 2 MG capsule Commonly known as: IMODIUM Take 1 capsule (2 mg total) by mouth as needed for diarrhea or loose stools.   losartan 50 MG tablet Commonly known as: COZAAR Take 1 tablet (50 mg total) by mouth in the morning and at bedtime.   Melatonin 10 MG Caps Take 10 mg by mouth at bedtime as needed.   metoprolol tartrate 25 MG tablet Commonly known as: LOPRESSOR TAKE 1 TABLET BY MOUTH TWICE DAILY.   omeprazole 40 MG capsule Commonly known as: PRILOSEC Take 1 capsule (40 mg total) by mouth in the morning and at bedtime.   ondansetron 4 MG tablet Commonly known as: ZOFRAN Take 1 tablet (4 mg total) by mouth every 6 (six) hours as needed for nausea or vomiting. What changed: Another medication with the same name was added. Make sure you understand how and when to take each.   ondansetron 4 MG tablet Commonly known as: ZOFRAN Take 1 tablet  (4 mg total) by mouth every 6 (six) hours as needed for nausea. What changed: You were already taking a medication with the same name, and this prescription was added. Make sure you understand how and when to take each.   prazosin 2 MG capsule Commonly known as: MINIPRESS TAKE 1 CAPSULE (2 MG TOTAL) BY MOUTH AT BEDTIME. What changed: how much to take   promethazine 25 MG tablet Commonly known as: PHENERGAN Take 1 tablet (25 mg total) by mouth every 6 (six) hours as needed for nausea or vomiting.   rizatriptan 10 MG disintegrating tablet Commonly known as: MAXALT-MLT Take 1 tablet (10 mg total) by mouth as needed. May repeat in 2 hours if needed   SUMAtriptan 50 MG tablet Commonly known as: IMITREX Take 50 mg by mouth every 2 (two) hours as needed for migraine. May repeat in 2 hours if headache persists or recurs.   SUMAtriptan 6 MG/0.5ML Soln injection Commonly known as: Imitrex Inject 0.5 mLs (6 mg total) into the skin as needed for migraine. May repeat in 2 hours if headache persists or recurs.       Follow-up Information    [name], MD. Schedule an appointment as soon as possible for a visit in 1 week(s).   Specialty: Family Medicine Contact information: [address], [place] [address] [phone]  No Known Allergies  Consultations:  None   Procedures/Studies: DG Chest 2 View  Result Date: 09/29/2020 CLINICAL DATA:  Shortness of breath EXAM: CHEST - 2 VIEW COMPARISON:  09/28/2017 FINDINGS: The heart size and mediastinal contours are within normal limits. Both lungs are clear. The visualized skeletal structures are unremarkable. IMPRESSION: Normal study. Electronically Signed   By: Rolm Baptise M.D.   On: 09/29/2020 19:53   CT ABDOMEN PELVIS W CONTRAST  Result Date: 09/29/2020 CLINICAL DATA:  54 year old female with abdominal pain. EXAM: CT ABDOMEN AND PELVIS WITH CONTRAST TECHNIQUE: Multidetector CT imaging of the  abdomen and pelvis was performed using the standard protocol following bolus administration of intravenous contrast. CONTRAST:  172mL OMNIPAQUE IOHEXOL 300 MG/ML  SOLN COMPARISON:  CT abdomen pelvis dated 02/11/2015. FINDINGS: Lower chest: The visualized lung bases are clear. No intra-abdominal free air. Trace free fluid in the pelvis. Hepatobiliary: Probable mild fatty liver. No intrahepatic biliary dilatation. Cholecystectomy. No retained calcified stone noted in the central CBD. Pancreas: Unremarkable. No pancreatic ductal dilatation or surrounding inflammatory changes. Spleen: Normal in size without focal abnormality. Adrenals/Urinary Tract: The adrenal glands unremarkable. The left kidney is unremarkable. There is severe right hydronephrosis with severe right renal parenchyma atrophy, likely secondary to longstanding hydronephrosis. There is a transition at the right ureteropelvic junction. No calcified stone identified. Findings concerning for a stricture at the right UPJ. A urothelial lesion or neoplasm is not excluded. Clinical correlation and further evaluation with direct visualization recommended. The urinary bladder is collapsed. Stomach/Bowel: There is long segment inflammatory changes and thickening of the distal and terminal ileum consistent with enteritis. Findings may be infectious in etiology, although an inflammatory bowel disease is not excluded clinical correlation is recommended. There is no bowel obstruction. The appendix is normal. Vascular/Lymphatic: The abdominal aorta and IVC unremarkable. No portal venous gas. There is no adenopathy. Reproductive: Hysterectomy. No adnexal masses. Other: None Musculoskeletal: No acute or significant osseous findings. Right L5 pars defect. No listhesis. IMPRESSION: 1. Enteritis of the distal small bowel. No bowel obstruction. Normal appendix. 2. Severe right hydronephrosis concerning for a stricture at the right UPJ. A urothelial lesion is not excluded.  Clinical correlation and further evaluation with direct visualization recommended. Electronically Signed   By: Anner Crete M.D.   On: 09/29/2020 20:37    (Echo, Carotid, EGD, Colonoscopy, ERCP)    Subjective: Seen and examined on day of discharge.  Stable, no distress.  Diarrhea resolved.  Stable for discharge home  Discharge Exam: Vitals:   10/03/20 0737 10/03/20 0739  BP: 135/80 130/77  Pulse: 78 84  Resp: 16 16  Temp: (!) 97.1 F (36.2 C) (!) 97.1 F (36.2 C)  SpO2: 95% 96%   Vitals:   10/03/20 0432 10/03/20 0734 10/03/20 0737 10/03/20 0739  BP: 113/69 131/74 135/80 130/77  Pulse: 64 72 78 84  Resp: 15 16 16 16   Temp: 98.1 F (36.7 C) (!) 97.1 F (36.2 C) (!) 97.1 F (36.2 C) (!) 97.1 F (36.2 C)  TempSrc: Oral Axillary    SpO2: 91% 94% 95% 96%  Weight:      Height:        General: Pt is alert, awake, not in acute distress Cardiovascular: RRR, S1/S2 +, no rubs, no gallops Respiratory: CTA bilaterally, no wheezing, no rhonchi Abdominal: Soft, NT, ND, bowel sounds + Extremities: no edema, no cyanosis    The results of significant diagnostics from this hospitalization (including imaging, microbiology, ancillary and laboratory) are listed below for  reference.     Microbiology: Recent Results (from the past 240 hour(s))  Gastrointestinal Panel by PCR , Stool     Status: None   Collection Time: 09/29/20  5:11 PM   Specimen: Stool  Result Value Ref Range Status   Campylobacter species NOT DETECTED NOT DETECTED Final   Plesimonas shigelloides NOT DETECTED NOT DETECTED Final   Salmonella species NOT DETECTED NOT DETECTED Final   Yersinia enterocolitica NOT DETECTED NOT DETECTED Final   Vibrio species NOT DETECTED NOT DETECTED Final   Vibrio cholerae NOT DETECTED NOT DETECTED Final   Enteroaggregative E coli (EAEC) NOT DETECTED NOT DETECTED Final   Enteropathogenic E coli (EPEC) NOT DETECTED NOT DETECTED Final   Enterotoxigenic E coli (ETEC) NOT DETECTED NOT  DETECTED Final   Shiga like toxin producing E coli (STEC) NOT DETECTED NOT DETECTED Final   Shigella/Enteroinvasive E coli (EIEC) NOT DETECTED NOT DETECTED Final   Cryptosporidium NOT DETECTED NOT DETECTED Final   Cyclospora cayetanensis NOT DETECTED NOT DETECTED Final   Entamoeba histolytica NOT DETECTED NOT DETECTED Final   Giardia lamblia NOT DETECTED NOT DETECTED Final   Adenovirus F40/41 NOT DETECTED NOT DETECTED Final   Astrovirus NOT DETECTED NOT DETECTED Final   Norovirus GI/GII NOT DETECTED NOT DETECTED Final   Rotavirus A NOT DETECTED NOT DETECTED Final   Sapovirus (I, II, IV, and V) NOT DETECTED NOT DETECTED Final    Comment: Performed at [facility], [address] [place], [place] [address]  C Difficile Quick Screen w PCR reflex     Status: None   Collection Time: 09/29/20  8:13 PM   Specimen: STOOL  Result Value Ref Range Status   C Diff antigen NEGATIVE NEGATIVE Final   C Diff toxin NEGATIVE NEGATIVE Final   C Diff interpretation No C. difficile detected.  Final    Comment: Performed at [facility], [address] [place], [place] [address]  Respiratory Panel by RT PCR (Flu A&B, Covid) - Nasopharyngeal Swab     Status: None   Collection Time: 09/29/20  8:13 PM   Specimen: Nasopharyngeal Swab  Result Value Ref Range Status   SARS Coronavirus 2 by RT PCR NEGATIVE NEGATIVE Final    Comment: (NOTE) SARS-CoV-2 target nucleic acids are NOT DETECTED.  The SARS-CoV-2 RNA is generally detectable in upper respiratoy specimens during the acute phase of infection. The lowest concentration of SARS-CoV-2 viral copies this assay can detect is 131 copies/mL. A negative result does not preclude SARS-Cov-2 infection and should not be used as the sole basis for treatment or other patient management decisions. A negative result may occur with  improper specimen collection/handling, submission of specimen other than nasopharyngeal swab, presence of viral  mutation(s) within the areas targeted by this assay, and inadequate number of viral copies (<131 copies/mL). A negative result must be combined with clinical observations, patient history, and epidemiological information. The expected result is Negative.  Fact Sheet for Patients:  PinkCheek.be  Fact Sheet for Healthcare Providers:  GravelBags.it  This test is no t yet approved or cleared by the [place] FDA and  has been authorized for detection and/or diagnosis of SARS-CoV-2 by FDA under an Emergency Use Authorization (EUA). This EUA will remain  in effect (meaning this test can be used) for the duration of the COVID-19 declaration under Section 564(b)(1) of the Act, 21 U.S.C. section 360bbb-3(b)(1), unless the authorization is terminated or revoked sooner.     Influenza A by PCR NEGATIVE NEGATIVE Final  Influenza B by PCR NEGATIVE NEGATIVE Final    Comment: (NOTE) The Xpert Xpress SARS-CoV-2/FLU/RSV assay is intended as an aid in  the diagnosis of influenza from Nasopharyngeal swab specimens and  should not be used as a sole basis for treatment. Nasal washings and  aspirates are unacceptable for Xpert Xpress SARS-CoV-2/FLU/RSV  testing.  Fact Sheet for Patients: PinkCheek.be  Fact Sheet for Healthcare Providers: GravelBags.it  This test is not yet approved or cleared by the [place] FDA and  has been authorized for detection and/or diagnosis of SARS-CoV-2 by  FDA under an Emergency Use Authorization (EUA). This EUA will remain  in effect (meaning this test can be used) for the duration of the  Covid-19 declaration under Section 564(b)(1) of the Act, 21  U.S.C. section 360bbb-3(b)(1), unless the authorization is  terminated or revoked. Performed at [facility], [address] [place], [place] [address]      Labs: BNP (last 3  results) No results for input(s): BNP in the last 8760 hours. Basic Metabolic Panel: Recent Labs  Lab 09/29/20 1651 09/30/20 0439 10/01/20 0848 10/02/20 0525 10/03/20 0511  NA 137 138 135 138 138  K 3.5 3.4* 3.1* 3.2* 3.6  CL 105 104 105 107 111  CO2 18* 24 20* 23 22  GLUCOSE 243* 229* 199* 151* 146*  BUN 16 12 7 6  <5*  CREATININE 1.21* 1.14* 1.10* 1.05* 1.02*  CALCIUM 9.3 9.0 8.6* 8.5* 8.5*  MG 2.1  --  1.7 1.8 1.8   Liver Function Tests: Recent Labs  Lab 09/29/20 1651  AST 14*  ALT 10  ALKPHOS 107  BILITOT 0.7  PROT 7.7  ALBUMIN 4.2   Recent Labs  Lab 09/29/20 1651  LIPASE 23   No results for input(s): AMMONIA in the last 168 hours. CBC: Recent Labs  Lab 09/29/20 1651 09/30/20 0439 10/01/20 0848 10/02/20 0525 10/03/20 0511  WBC 13.5* 12.5* 5.6 4.6 5.5  NEUTROABS  --   --  3.9 2.6 3.3  HGB 13.1 10.9* 10.1* 9.3* 9.6*  HCT 40.1 33.6* 30.7* 27.9* 28.9*  MCV 82.0 83.0 82.3 82.5 82.1  PLT 299 202 129* 148* 141*   Cardiac Enzymes: No results for input(s): CKTOTAL, CKMB, CKMBINDEX, TROPONINI in the last 168 hours. BNP: Invalid input(s): POCBNP CBG: Recent Labs  Lab 10/02/20 0830 10/02/20 1143 10/02/20 1632 10/02/20 2109 10/03/20 0743  GLUCAP 124* 185* 203* 130* 112*   D-Dimer No results for input(s): DDIMER in the last 72 hours. Hgb A1c No results for input(s): HGBA1C in the last 72 hours. Lipid Profile No results for input(s): CHOL, HDL, LDLCALC, TRIG, CHOLHDL, LDLDIRECT in the last 72 hours. Thyroid function studies No results for input(s): TSH, T4TOTAL, T3FREE, THYROIDAB in the last 72 hours.  Invalid input(s): FREET3 Anemia work up No results for input(s): VITAMINB12, FOLATE, FERRITIN, TIBC, IRON, RETICCTPCT in the last 72 hours. Urinalysis    Component Value Date/Time   COLORURINE AMBER (A) 09/29/2020 1709   APPEARANCEUR TURBID (A) 09/29/2020 1709   LABSPEC 1.032 (H) 09/29/2020 1709   PHURINE 5.0 09/29/2020 1709   GLUCOSEU 50 (A)  09/29/2020 1709   HGBUR SMALL (A) 09/29/2020 1709   BILIRUBINUR NEGATIVE 09/29/2020 1709   KETONESUR 20 (A) 09/29/2020 1709   PROTEINUR 100 (A) 09/29/2020 1709   UROBILINOGEN 0.2 02/11/2015 1848   NITRITE NEGATIVE 09/29/2020 1709   LEUKOCYTESUR NEGATIVE 09/29/2020 1709   Sepsis Labs Invalid input(s): PROCALCITONIN,  WBC,  LACTICIDVEN Microbiology Recent Results (from the past 240  hour(s))  Gastrointestinal Panel by PCR , Stool     Status: None   Collection Time: 09/29/20  5:11 PM   Specimen: Stool  Result Value Ref Range Status   Campylobacter species NOT DETECTED NOT DETECTED Final   Plesimonas shigelloides NOT DETECTED NOT DETECTED Final   Salmonella species NOT DETECTED NOT DETECTED Final   Yersinia enterocolitica NOT DETECTED NOT DETECTED Final   Vibrio species NOT DETECTED NOT DETECTED Final   Vibrio cholerae NOT DETECTED NOT DETECTED Final   Enteroaggregative E coli (EAEC) NOT DETECTED NOT DETECTED Final   Enteropathogenic E coli (EPEC) NOT DETECTED NOT DETECTED Final   Enterotoxigenic E coli (ETEC) NOT DETECTED NOT DETECTED Final   Shiga like toxin producing E coli (STEC) NOT DETECTED NOT DETECTED Final   Shigella/Enteroinvasive E coli (EIEC) NOT DETECTED NOT DETECTED Final   Cryptosporidium NOT DETECTED NOT DETECTED Final   Cyclospora cayetanensis NOT DETECTED NOT DETECTED Final   Entamoeba histolytica NOT DETECTED NOT DETECTED Final   Giardia lamblia NOT DETECTED NOT DETECTED Final   Adenovirus F40/41 NOT DETECTED NOT DETECTED Final   Astrovirus NOT DETECTED NOT DETECTED Final   Norovirus GI/GII NOT DETECTED NOT DETECTED Final   Rotavirus A NOT DETECTED NOT DETECTED Final   Sapovirus (I, II, IV, and V) NOT DETECTED NOT DETECTED Final    Comment: Performed at [facility], [address] [place], [place] [address]  C Difficile Quick Screen w PCR reflex     Status: None   Collection Time: 09/29/20  8:13 PM   Specimen: STOOL  Result Value Ref Range Status    C Diff antigen NEGATIVE NEGATIVE Final   C Diff toxin NEGATIVE NEGATIVE Final   C Diff interpretation No C. difficile detected.  Final    Comment: Performed at [facility], [address] [place], [place] [address]  Respiratory Panel by RT PCR (Flu A&B, Covid) - Nasopharyngeal Swab     Status: None   Collection Time: 09/29/20  8:13 PM   Specimen: Nasopharyngeal Swab  Result Value Ref Range Status   SARS Coronavirus 2 by RT PCR NEGATIVE NEGATIVE Final    Comment: (NOTE) SARS-CoV-2 target nucleic acids are NOT DETECTED.  The SARS-CoV-2 RNA is generally detectable in upper respiratoy specimens during the acute phase of infection. The lowest concentration of SARS-CoV-2 viral copies this assay can detect is 131 copies/mL. A negative result does not preclude SARS-Cov-2 infection and should not be used as the sole basis for treatment or other patient management decisions. A negative result may occur with  improper specimen collection/handling, submission of specimen other than nasopharyngeal swab, presence of viral mutation(s) within the areas targeted by this assay, and inadequate number of viral copies (<131 copies/mL). A negative result must be combined with clinical observations, patient history, and epidemiological information. The expected result is Negative.  Fact Sheet for Patients:  PinkCheek.be  Fact Sheet for Healthcare Providers:  GravelBags.it  This test is no t yet approved or cleared by the [place] FDA and  has been authorized for detection and/or diagnosis of SARS-CoV-2 by FDA under an Emergency Use Authorization (EUA). This EUA will remain  in effect (meaning this test can be used) for the duration of the COVID-19 declaration under Section 564(b)(1) of the Act, 21 U.S.C. section 360bbb-3(b)(1), unless the authorization is terminated or revoked sooner.     Influenza A by PCR NEGATIVE NEGATIVE  Final   Influenza B by PCR NEGATIVE NEGATIVE Final  Comment: (NOTE) The Xpert Xpress SARS-CoV-2/FLU/RSV assay is intended as an aid in  the diagnosis of influenza from Nasopharyngeal swab specimens and  should not be used as a sole basis for treatment. Nasal washings and  aspirates are unacceptable for Xpert Xpress SARS-CoV-2/FLU/RSV  testing.  Fact Sheet for Patients: PinkCheek.be  Fact Sheet for Healthcare Providers: GravelBags.it  This test is not yet approved or cleared by the [place] FDA and  has been authorized for detection and/or diagnosis of SARS-CoV-2 by  FDA under an Emergency Use Authorization (EUA). This EUA will remain  in effect (meaning this test can be used) for the duration of the  Covid-19 declaration under Section 564(b)(1) of the Act, 21  U.S.C. section 360bbb-3(b)(1), unless the authorization is  terminated or revoked. Performed at [facility], [address], [place], [place] [address]      Time coordinating discharge: Over 30 minutes  SIGNED:   Sidney Ace, MD  [facility] 10/03/2020, 11:03 AM Pager   If 7PM-7AM, please contact night-coverage

## 2020-10-03 NOTE — Discharge Instructions (Signed)
Diabetes Mellitus and Nutrition, Adult When you have diabetes (diabetes mellitus), it is very important to have healthy eating habits because your blood sugar (glucose) levels are greatly affected by what you eat and drink. Eating healthy foods in the appropriate amounts, at about the same times every day, can help you:  Control your blood glucose.  Lower your risk of heart disease.  Improve your blood pressure.  Reach or maintain a healthy weight. Every person with diabetes is different, and each person has different needs for a meal plan. Your health care provider may recommend that you work with a diet and nutrition specialist (dietitian) to make a meal plan that is best for you. Your meal plan may vary depending on factors such as:  The calories you need.  The medicines you take.  Your weight.  Your blood glucose, blood pressure, and cholesterol levels.  Your activity level.  Other health conditions you have, such as heart or kidney disease. How do carbohydrates affect me? Carbohydrates, also called carbs, affect your blood glucose level more than any other type of food. Eating carbs naturally raises the amount of glucose in your blood. Carb counting is a method for keeping track of how many carbs you eat. Counting carbs is important to keep your blood glucose at a healthy level, especially if you use insulin or take certain oral diabetes medicines. It is important to know how many carbs you can safely have in each meal. This is different for every person. Your dietitian can help you calculate how many carbs you should have at each meal and for each snack. Foods that contain carbs include:  Bread, cereal, rice, pasta, and crackers.  Potatoes and corn.  Peas, beans, and lentils.  Milk and yogurt.  Fruit and juice.  Desserts, such as cakes, cookies, ice cream, and candy. How does alcohol affect me? Alcohol can cause a sudden decrease in blood glucose (hypoglycemia),  especially if you use insulin or take certain oral diabetes medicines. Hypoglycemia can be a life-threatening condition. Symptoms of hypoglycemia (sleepiness, dizziness, and confusion) are similar to symptoms of having too much alcohol. If your health care provider says that alcohol is safe for you, follow these guidelines:  Limit alcohol intake to no more than 1 drink per day for nonpregnant women and 2 drinks per day for men. One drink equals 12 oz of beer, 5 oz of wine, or 1 oz of hard liquor.  Do not drink on an empty stomach.  Keep yourself hydrated with water, diet soda, or unsweetened iced tea.  Keep in mind that regular soda, juice, and other mixers may contain a lot of sugar and must be counted as carbs. What are tips for following this plan?  Reading food labels  Start by checking the serving size on the "Nutrition Facts" label of packaged foods and drinks. The amount of calories, carbs, fats, and other nutrients listed on the label is based on one serving of the item. Many items contain more than one serving per package.  Check the total grams (g) of carbs in one serving. You can calculate the number of servings of carbs in one serving by dividing the total carbs by 15. For example, if a food has 30 g of total carbs, it would be equal to 2 servings of carbs.  Check the number of grams (g) of saturated and trans fats in one serving. Choose foods that have low or no amount of these fats.  Check the number of   milligrams (mg) of salt (sodium) in one serving. Most people should limit total sodium intake to less than 2,300 mg per day.  Always check the nutrition information of foods labeled as "low-fat" or "nonfat". These foods may be higher in added sugar or refined carbs and should be avoided.  Talk to your dietitian to identify your daily goals for nutrients listed on the label. Shopping  Avoid buying canned, premade, or processed foods. These foods tend to be high in fat, sodium,  and added sugar.  Shop around the outside edge of the grocery store. This includes fresh fruits and vegetables, bulk grains, fresh meats, and fresh dairy. Cooking  Use low-heat cooking methods, such as baking, instead of high-heat cooking methods like deep frying.  Cook using healthy oils, such as olive, canola, or sunflower oil.  Avoid cooking with butter, cream, or high-fat meats. Meal planning  Eat meals and snacks regularly, preferably at the same times every day. Avoid going long periods of time without eating.  Eat foods high in fiber, such as fresh fruits, vegetables, beans, and whole grains. Talk to your dietitian about how many servings of carbs you can eat at each meal.  Eat 4-6 ounces (oz) of lean protein each day, such as lean meat, chicken, fish, eggs, or tofu. One oz of lean protein is equal to: ? 1 oz of meat, chicken, or fish. ? 1 egg. ?  cup of tofu.  Eat some foods each day that contain healthy fats, such as avocado, nuts, seeds, and fish. Lifestyle  Check your blood glucose regularly.  Exercise regularly as told by your health care provider. This may include: ? 150 minutes of moderate-intensity or vigorous-intensity exercise each week. This could be brisk walking, biking, or water aerobics. ? Stretching and doing strength exercises, such as yoga or weightlifting, at least 2 times a week.  Take medicines as told by your health care provider.  Do not use any products that contain nicotine or tobacco, such as cigarettes and e-cigarettes. If you need help quitting, ask your health care provider.  Work with a Social worker or diabetes educator to identify strategies to manage stress and any emotional and social challenges. Questions to ask a health care provider  Do I need to meet with a diabetes educator?  Do I need to meet with a dietitian?  What number can I call if I have questions?  When are the best times to check my blood glucose? Where to find more  information:  American Diabetes Association: diabetes.org  Academy of Nutrition and Dietetics: www.eatright.[employer] of Diabetes and Digestive and Kidney Diseases (NIH): [employer] Summary  A healthy meal plan will help you control your blood glucose and maintain a healthy lifestyle.  Working with a diet and nutrition specialist (dietitian) can help you make a meal plan that is best for you.  Keep in mind that carbohydrates (carbs) and alcohol have immediate effects on your blood glucose levels. It is important to count carbs and to use alcohol carefully. This information is not intended to replace advice given to you by your health care provider. Make sure you discuss any questions you have with your health care provider. Document Revised: 10/12/2017 Document Reviewed: 12/04/2016 Elsevier Patient Education  2020 [place] Choices to Help Relieve Diarrhea, Adult When you have diarrhea, the foods you eat and your eating habits are very important. Choosing the right foods and drinks can help:  Relieve diarrhea.  Replace lost fluids and nutrients.  Prevent dehydration. What general guidelines should I follow?  Relieving diarrhea  Choose foods with less than 2 g or .07 oz. of fiber per serving.  Limit fats to less than 8 tsp (38 g or 1.34 oz.) a day.  Avoid the following: ? Foods and beverages sweetened with high-fructose corn syrup, honey, or sugar alcohols such as xylitol, sorbitol, and mannitol. ? Foods that contain a lot of fat or sugar. ? Fried, greasy, or spicy foods. ? High-fiber grains, breads, and cereals. ? Raw fruits and vegetables.  Eat foods that are rich in probiotics. These foods include dairy products such as yogurt and fermented milk products. They help increase healthy bacteria in the stomach and intestines (gastrointestinal tract, or GI tract).  If you have lactose intolerance, avoid dairy products. These may make your diarrhea  worse.  Take medicine to help stop diarrhea (antidiarrheal medicine) only as told by your health care provider. Replacing nutrients  Eat small meals or snacks every 3-4 hours.  Eat bland foods, such as white rice, toast, or baked potato, until your diarrhea starts to get better. Gradually reintroduce nutrient-rich foods as tolerated or as told by your health care provider. This includes: ? Well-cooked protein foods. ? Peeled, seeded, and soft-cooked fruits and vegetables. ? Low-fat dairy products.  Take vitamin and mineral supplements as told by your health care provider. Preventing dehydration  Start by sipping water or a special solution to prevent dehydration (oral rehydration solution, ORS). Urine that is clear or pale yellow means that you are getting enough fluid.  Try to drink at least 8-10 cups of fluid each day to help replace lost fluids.  You may add other liquids in addition to water, such as clear juice or decaffeinated sports drinks, as tolerated or as told by your health care provider.  Avoid drinks with caffeine, such as coffee, tea, or soft drinks.  Avoid alcohol. What foods are recommended?     The items listed may not be a complete list. Talk with your health care provider about what dietary choices are best for you. Grains White rice. White, [place], or pita breads (fresh or toasted), including plain rolls, buns, or bagels. White pasta. Saltine, soda, or graham crackers. Pretzels. Low-fiber cereal. Cooked cereals made with water (such as cornmeal, farina, or cream cereals). Plain muffins. Matzo. Melba toast. Zwieback. Vegetables Potatoes (without the skin). Most well-cooked and canned vegetables without skins or seeds. Tender lettuce. Fruits Apple sauce. Fruits canned in juice. Cooked apricots, cherries, grapefruit, peaches, pears, or plums. Fresh bananas and cantaloupe. Meats and other protein foods Baked or boiled chicken. Eggs. Tofu. Fish. Seafood. Smooth nut  butters. Ground or well-cooked tender beef, ham, veal, lamb, pork, or poultry. Dairy Plain yogurt, kefir, and unsweetened liquid yogurt. Lactose-free milk, buttermilk, skim milk, or soy milk. Low-fat or nonfat hard cheese. Beverages Water. Low-calorie sports drinks. Fruit juices without pulp. Strained tomato and vegetable juices. Decaffeinated teas. Sugar-free beverages not sweetened with sugar alcohols. Oral rehydration solutions, if approved by your health care provider. Seasoning and other foods Bouillon, broth, or soups made from recommended foods. What foods are not recommended? The items listed may not be a complete list. Talk with your health care provider about what dietary choices are best for you. Grains Whole grain, whole wheat, bran, or rye breads, rolls, pastas, and crackers. Wild or brown rice. Whole grain or bran cereals. Barley. Oats and oatmeal. Corn tortillas or taco shells. Granola. Popcorn. Vegetables Raw vegetables. Fried vegetables. Cabbage, broccoli, Brussels  sprouts, artichokes, baked beans, beet greens, corn, kale, legumes, peas, sweet potatoes, and yams. Potato skins. Cooked spinach and cabbage. Fruits Dried fruit, including raisins and dates. Raw fruits. Stewed or dried prunes. Canned fruits with syrup. Meat and other protein foods Fried or fatty meats. Deli meats. Chunky nut butters. Nuts and seeds. Beans and lentils. Berniece Salines. Hot dogs. Sausage. Dairy High-fat cheeses. Whole milk, chocolate milk, and beverages made with milk, such as milk shakes. Half-and-half. Cream. sour cream. Ice cream. Beverages Caffeinated beverages (such as coffee, tea, soda, or energy drinks). Alcoholic beverages. Fruit juices with pulp. Prune juice. Soft drinks sweetened with high-fructose corn syrup or sugar alcohols. High-calorie sports drinks. Fats and oils Butter. Cream sauces. Margarine. Salad oils. Plain salad dressings. Olives. Avocados. Mayonnaise. Sweets and desserts Sweet rolls,  doughnuts, and sweet breads. Sugar-free desserts sweetened with sugar alcohols such as xylitol and sorbitol. Seasoning and other foods Honey. Hot sauce. Chili powder. Gravy. Cream-based or milk-based soups. Pancakes and waffles. Summary  When you have diarrhea, the foods you eat and your eating habits are very important.  Make sure you get at least 8-10 cups of fluid each day, or enough to keep your urine clear or pale yellow.  Eat bland foods and gradually reintroduce healthy, nutrient-rich foods as tolerated, or as told by your health care provider.  Avoid high-fiber, fried, greasy, or spicy foods. This information is not intended to replace advice given to you by your health care provider. Make sure you discuss any questions you have with your health care provider. Document Revised: 02/20/2019 Document Reviewed: 10/27/2016 Elsevier Patient Education  [place]. Viral Gastroenteritis, Adult  Viral gastroenteritis is also known as the stomach flu. This condition may affect your stomach, your small intestine, and your large intestine. It can cause sudden watery poop (diarrhea), fever, and throwing up (vomiting). This condition is caused by certain germs (viruses). These germs can be passed from person to person very easily (are contagious). Having watery poop and throwing up can make you feel weak and cause you to not have enough water in your body (get dehydrated). This can make you tired and thirsty, make you have a dry mouth, and make it so you pee (urinate) less often. It is important to replace the fluids that you lose from having watery poop and throwing up. What are the causes?  You can get sick by catching viruses from other people.  You can also get sick by: ? Eating food, drinking water, or touching a surface that has the viruses on it (is contaminated). ? Sharing utensils or other personal items with a person who is sick. What increases the risk?  Having a weak body  defense system (immune system).  Living with one or more children who are younger than 35 years old.  Living in a nursing home.  Going on cruise ships. What are the signs or symptoms? Symptoms of this condition start suddenly. Symptoms may last for a few days or for as long as a week.  Common symptoms include: ? Watery poop. ? Throwing up.  Other symptoms include: ? Fever. ? Headache. ? Feeling tired (fatigue). ? Pain in the belly (abdomen). ? Chills. ? Feeling weak. ? Feeling sick to your stomach (nauseous). ? Muscle aches. ? Not feeling hungry. How is this treated?  This condition typically goes away on its own.  The focus of treatment is to replace the fluids that you lose. This condition may be treated with: ? An ORS (oral  rehydration solution). This is a drink that is sold at pharmacies and stores. ? Medicines to help with your symptoms. ? Probiotic supplements to reduce symptoms of diarrhea. ? Fluids given through an IV tube, if needed.  Older adults and people with other diseases or a weak body defense system are at higher risk for not having enough water in the body. Follow these instructions at home: Eating and drinking   Take an ORS as told by your doctor.  Drink clear fluids in small amounts as you are able. Clear fluids include: ? Water. ? Ice chips. ? Fruit juice with water added to it (diluted). ? Low-calorie sports drinks.  Drink enough fluid to keep your pee (urine) pale yellow.  Eat small amounts of healthy foods every 3-4 hours as you are able. This may include whole grains, fruits, vegetables, lean meats, and yogurt.  Avoid fluids that have a lot of sugar or caffeine in them, such as energy drinks, sports drinks, and soda.  Avoid spicy or fatty foods.  Avoid alcohol. General instructions   Wash your hands often. This is very important after you have watery poop or you throw up. If you cannot use soap and water, use hand sanitizer.  Make  sure that all people in your home wash their hands well and often.  Take over-the-counter and prescription medicines only as told by your doctor.  Rest at home while you get better.  Watch your condition for any changes.  Take a warm bath to help with any burning or pain from having watery poop.  Keep all follow-up visits as told by your doctor. This is important. Contact a doctor if:  You cannot keep fluids down.  Your symptoms get worse.  You have new symptoms.  You feel light-headed.  You feel dizzy.  You have muscle cramps. Get help right away if:  You have chest pain.  You feel very weak.  You pass out (faint).  You see blood in your throw-up.  Your throw-up looks like coffee grounds.  You have bloody or black poop (stools) or poop that looks like tar.  You have a very bad headache, or a stiff neck, or both.  You have a rash.  You have very bad pain, cramping, or bloating in your belly.  You have trouble breathing.  You are breathing very quickly.  You have a fast heartbeat.  Your skin feels cold and clammy.  You feel mixed up (confused).  You have pain when you pee.  You have signs of not having enough water in the body, such as: ? Dark pee, hardly any pee, or no pee. ? Cracked lips. ? Dry mouth. ? Sunken eyes. ? Feeling very sleepy. ? Feeling weak. Summary  Viral gastroenteritis is also known as the stomach flu.  This condition can cause sudden watery poop (diarrhea), fever, and throwing up (vomiting).  These germs can be passed from person to person very easily.  Take an ORS as told by your doctor. This is a drink that is sold at pharmacies and stores.  Drink fluids in small amounts many times each day as you are able. This information is not intended to replace advice given to you by your health care provider. Make sure you discuss any questions you have with your health care provider. Document Revised: 09/04/2018 Document Reviewed:  09/04/2018 Elsevier Patient Education  2020 [employer].

## 2020-10-06 ENCOUNTER — Telehealth: Payer: Self-pay | Admitting: Nurse Practitioner

## 2020-10-06 NOTE — Telephone Encounter (Signed)
She called our answering service, she was discharged from the hospital on 11/21 with gastroenteritis. GI pathogen panel and C. Diff were negative. Antibiotics were stopped. She reports having recurrence of yellow foul smelling watery nonbloody diarrhea. Generalized abd cramping pain. I advised her to increase Dicyclomine to 20mg  qid, Loperamide 2mg  bid, add Florastor 1 po bid, clear liquids until tomorrow. Pt to call me by noon tomorrow with further update. To ED if she has severe abdominal pain or decreased urine output. She verbalized understanding these instructions. Hx of C. Diff in 2014.  She is anxious.

## 2020-10-11 ENCOUNTER — Telehealth: Payer: Self-pay

## 2020-10-11 NOTE — Telephone Encounter (Signed)
-----   Message from Noralyn Pick, NP sent at 10/10/2020  7:10 PM EST ----- Regarding: Pt called answering service 11/24. Hi Dr. Fuller Plan and Barbera Setters, patient called answering service on 11/24. Phone note as follows: She called our answering service, she was discharged from the hospital on 11/21 with gastroenteritis. GI pathogen panel and C. Diff were negative. Antibiotics were stopped. She reports having recurrence of yellow foul smelling watery nonbloody diarrhea. Generalized abd cramping pain. I advised her to increase Dicyclomine to 20mg  qid, Loperamide 2mg  bid, add Florastor 1 po bid, clear liquids until tomorrow. Pt to call me by noon tomorrow with further update. To ED if she has severe abdominal pain or decreased urine output. She verbalized understanding these instructions. Hx of C. Diff in 2014.  She is anxious.  Ivionna Verley, Pls call the patient Monday 11/29 and see how she is doing. If she is still having diarrhea I told her we could do another stool test to check for C. Diff. Pls provide Dr. Fuller Plan with any update. Thx

## 2020-10-11 NOTE — Telephone Encounter (Signed)
Left message for patient to call back  

## 2020-10-12 NOTE — Telephone Encounter (Signed)
Attempted to return call to the patient. Her VM is full.  Unable to leave a message. I will await a return call from the patient.

## 2020-10-13 NOTE — Telephone Encounter (Signed)
Patient returned call.  She is still experiencing diarrhea. She states her stool is foul smelling and appears to be coffee ground. The dicyclomine helps some and she has tried the imodium very minimally.  She is advised that she needs to have a stool test for C-diff, per Carl Best, RNP orders.  She was seen by her PCP on Monday.  They did blood work, but not a test for c-diff.  She is advised that she needs to come for stool studies to rule out c-diff.  She states she can't come here to the office from [facility] because she is too weak and has too much diarrhea.    She is advised that she needs to go to the ED for eval. She states that she does not have any friends or family in the area to take her to pick up the stool container or to take her to the ED.  She is unwilling to call an ambulance be cause of the cost.  She frequently brought up in our 20 minute conversation that one of her kidneys is failing, her BS is over 300, she needs insulin, and something done about the diarrhea. I advised multiple times she needs to go to the ED for eval.  She states that her PCP is working on finding her an endocrinologist, possible GI in her area, and a urologist. "Ii will just wait until these appointments".  We discussed that getting in with a specialist can be an extensive wait and she should seek care now.   She continued to bring up many times she needed help, but is unwilling to take any suggestions to get help.  I advised her multiple times she needs an ER evaluation and possible admission.  Each time I advised her to go to the ED via ambulance she gave me multiple reasons why she could not. Including cost, waste of time, they don't take me seriously, my diarrhea and I can't sit there".   I advised her that if she is unwilling to get to a care facility to be evaluated or won't come for stool studies, we will not be able to help her. She thanked me for the call and said she will see how she feels tomorrow  and maybe will try and get the stool studies completed.

## 2020-10-13 NOTE — Telephone Encounter (Signed)
Conversation, recommendations noted. Agree with recommendations for C diff and ED evaluation.

## 2020-11-03 ENCOUNTER — Telehealth: Payer: Self-pay | Admitting: Adult Health

## 2020-11-03 NOTE — Telephone Encounter (Addendum)
(  Staff) Pt called to report new meds and diagnoses. Ask AM to return call @ [phone].  Return call, 5:30pm -- Was back in the hospital a couple weeks ago, will be seeing new family physician.  Has been confirmed with diabetes and chronic kidney failure in one kidney.  New medication, can't recall but will advise.  2nd issue that her student loan relief application is temporarily denied again d/t credentials of the signatories both treating clinicians not meeting statutory requirements for an MD.  Agreed to check with the agency, find out whether MD needs to actually evaluate or could merely endorse the evaluation of the two clinicians.    [facility] service customer service # [phone], acct. # [id number].  SSID [id number]  Has new PCP d/t Medicaid enrollment as well, meaning transition from a beloved, long-serving, well-informed PCP of many years.  F/u call to customer service, informed clearly that Dr. Casimiro Needle endorsement of myself and Dr. Dwaine Gale will be sufficient to accept her application.  [name], PhD LP Clinical Psychologist, [facility] [address], [place], [place] [address] [phone]

## 2020-11-12 ENCOUNTER — Other Ambulatory Visit: Payer: Self-pay | Admitting: Physician Assistant

## 2020-11-30 ENCOUNTER — Telehealth: Payer: Self-pay | Admitting: Adult Health

## 2020-11-30 ENCOUNTER — Telehealth: Payer: Self-pay | Admitting: Psychiatry

## 2020-11-30 ENCOUNTER — Other Ambulatory Visit: Payer: Self-pay | Admitting: Adult Health

## 2020-11-30 DIAGNOSIS — G4700 Insomnia, unspecified: Secondary | ICD-10-CM

## 2020-11-30 DIAGNOSIS — F411 Generalized anxiety disorder: Secondary | ICD-10-CM

## 2020-11-30 DIAGNOSIS — F4001 Agoraphobia with panic disorder: Secondary | ICD-10-CM

## 2020-11-30 MED ORDER — CLONAZEPAM 1 MG PO TABS: 1.0000 mg | ORAL_TABLET | Freq: Two times a day (BID) | ORAL | 0 refills | Status: DC

## 2020-11-30 NOTE — Telephone Encounter (Signed)
Pt requesting refills for Clonazepam & Diazepam @ [facility] Apt [facility]

## 2020-11-30 NOTE — Telephone Encounter (Signed)
Clonazepam sent. Will need to discuss Valium.

## 2020-11-30 NOTE — Telephone Encounter (Signed)
Admin note for non-service contact  Patient ID: Deanna George  MRN: [id number] DATE: 11/30/2020  PT reports turned down once more for disability based entirely on the technicality that Dr. Casimiro Needle signature is not actually on the form, even though it is on an attachment.  Agreed to reprocess, with update.  Medically, has been deteriorating some, has endocrinologist appt tomorrow.  D/t lack of funds, is getting by with phone appt with Ms. Dwaine Gale soon, holding off [place] until funded, but immensely grateful for extended service reprocessing her disability application.  [name], PhD [name], PhD LP Clinical Psychologist, [facility] [address], [place], [place] [address] [phone]

## 2020-12-03 ENCOUNTER — Encounter: Payer: Self-pay | Admitting: Adult Health

## 2020-12-03 ENCOUNTER — Ambulatory Visit (INDEPENDENT_AMBULATORY_CARE_PROVIDER_SITE_OTHER): Payer: Medicaid Other | Admitting: Adult Health

## 2020-12-03 DIAGNOSIS — F4310 Post-traumatic stress disorder, unspecified: Secondary | ICD-10-CM

## 2020-12-03 DIAGNOSIS — F331 Major depressive disorder, recurrent, moderate: Secondary | ICD-10-CM

## 2020-12-03 DIAGNOSIS — F411 Generalized anxiety disorder: Secondary | ICD-10-CM

## 2020-12-03 DIAGNOSIS — F4001 Agoraphobia with panic disorder: Secondary | ICD-10-CM

## 2020-12-03 DIAGNOSIS — G4700 Insomnia, unspecified: Secondary | ICD-10-CM

## 2020-12-03 MED ORDER — CLONAZEPAM 1 MG PO TABS: 1.0000 mg | ORAL_TABLET | Freq: Two times a day (BID) | ORAL | 2 refills | Status: DC

## 2020-12-03 MED ORDER — PRAZOSIN HCL 2 MG PO CAPS: 2.0000 mg | ORAL_CAPSULE | Freq: Every day | ORAL | 5 refills | Status: DC

## 2020-12-03 MED ORDER — DULOXETINE HCL 60 MG PO CPEP: 120.0000 mg | ORAL_CAPSULE | Freq: Every day | ORAL | 5 refills | Status: DC

## 2020-12-03 NOTE — Progress Notes (Signed)
Deanna George [id number] 1966/08/11 55 y.o.  Virtual Visit via Telephone Note  I connected with pt on 12/03/20 at  3:00 PM EST by telephone and verified that I am speaking with the correct person using two identifiers.   I discussed the limitations, risks, security and privacy concerns of performing an evaluation and management service by telephone and the availability of in person appointments. I also discussed with the patient that there may be a patient responsible charge related to this service. The patient expressed understanding and agreed to proceed.   I discussed the assessment and treatment plan with the patient. The patient was provided an opportunity to ask questions and all were answered. The patient agreed with the plan and demonstrated an understanding of the instructions.   The patient was advised to call back or seek an in-person evaluation if the symptoms worsen or if the condition fails to improve as anticipated.  I provided 30 minutes of non-face-to-face time during this encounter.  The patient was located at home.  The provider was located at [place].   Deanna Gell, NP   Subjective:   Patient ID:  Deanna George is a 55 y.o. (DOB 1966-10-27) female.  Chief Complaint: No chief complaint on file.   HPI Deanna George presents for follow-up of MDD, PTSD, GAD, panic disorder, insomnia.  Describes mood today as "not the best". Returned to [facility] in October and has been hospitalized twice. Pleasant. Mood symptoms - reports depression, irritability and anxiety. Stating "I'm struggling emotionally". Feels exhausted. Reports panic attacks. Reports being in "extreme" pain - is without pain medications. Stating "I'm struggling physically". Body overwhelmed with issues - kidney - diabetes. Reports medical issues with hospitalization. Continues to work with Dr. Rica Mote. Varying interest and motivation. Taking medications as prescribed.  Energy levels low. Active, does  not have a regular exercise routine.   Unable to enjoy usual interests and activities. Single. Lives alone with cat. Trying to connect with a local church.   Appetite adequate. Weight gain - 216 pounds. Sleeps better some nights than others. Averages 5 to 6 hours. Focus and concentration difficulties - ADHD. Completing some tasks. Managing some aspects of household. Trying to finish packing to move.  Denies SI or HI.  Denies AH or VH.  Previous medications: Ritalin and Thorazine - others, Trazadone    Review of Systems:  Review of Systems  Musculoskeletal: Negative for gait problem.  Neurological: Negative for tremors.  Psychiatric/Behavioral:       Please refer to HPI    Medications: I have reviewed the patient's current medications.  Current Outpatient Medications  Medication Sig Dispense Refill   clonazePAM (KLONOPIN) 1 MG tablet Take 1 tablet (1 mg total) by mouth 2 (two) times daily. 60 tablet 2   cyclobenzaprine (FLEXERIL) 10 MG tablet TAKE 1 TABLET BY MOUTH EVERY 8 HOURS AS NEEDED FOR 30 DAYS     dicyclomine (BENTYL) 10 MG capsule Take 1 capsule (10 mg total) by mouth 4 (four) times daily -  before meals and at bedtime for 7 days. 28 capsule 0   diphenhydrAMINE (BENADRYL) 25 mg capsule Take 25 mg by mouth every 6 (six) hours as needed for allergies.     DULoxetine (CYMBALTA) 60 MG capsule Take 2 capsules (120 mg total) by mouth daily. 60 capsule 5   fluticasone (FLONASE) 50 MCG/ACT nasal spray Place 2 sprays into both nostrils daily for 7 days. 15.8 mL 0   ibuprofen (ADVIL) 800 MG tablet  1 tablet with food or milk as needed     loperamide (IMODIUM) 2 MG capsule Take 1 capsule (2 mg total) by mouth as needed for diarrhea or loose stools. 30 capsule 0   losartan (COZAAR) 50 MG tablet Take 1 tablet (50 mg total) by mouth in the morning and at bedtime. 60 tablet 11   Melatonin 10 MG CAPS Take 10 mg by mouth at bedtime as needed.      metoprolol tartrate (LOPRESSOR) 25  MG tablet TAKE 1 TABLET BY MOUTH TWICE DAILY. 60 tablet 11   omeprazole (PRILOSEC) 40 MG capsule TAKE 1 CAPSULE BY MOUTH EVERY MORNING AND AT BEDTIME 180 capsule 0   ondansetron (ZOFRAN) 4 MG tablet Take 1 tablet (4 mg total) by mouth every 6 (six) hours as needed for nausea or vomiting. 30 tablet 11   ondansetron (ZOFRAN) 4 MG tablet Take 1 tablet (4 mg total) by mouth every 6 (six) hours as needed for nausea. 20 tablet 0   prazosin (MINIPRESS) 2 MG capsule Take 1 capsule (2 mg total) by mouth at bedtime. 30 capsule 5   promethazine (PHENERGAN) 25 MG tablet Take 1 tablet (25 mg total) by mouth every 6 (six) hours as needed for nausea or vomiting. 30 tablet 11   rizatriptan (MAXALT-MLT) 10 MG disintegrating tablet Take 1 tablet (10 mg total) by mouth as needed. May repeat in 2 hours if needed 15 tablet 4   SUMAtriptan (IMITREX) 50 MG tablet Take 50 mg by mouth every 2 (two) hours as needed for migraine. May repeat in 2 hours if headache persists or recurs.     SUMAtriptan (IMITREX) 6 MG/0.5ML SOLN injection Inject 0.5 mLs (6 mg total) into the skin as needed for migraine. May repeat in 2 hours if headache persists or recurs. 10 vial 4   No current facility-administered medications for this visit.    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    Situational stress    Urinary tract infection     Family History  Adopted: Yes  Problem Relation Age of Onset   Colon cancer Neg Hx     Pancreatic cancer Neg Hx    Stomach cancer Neg Hx    Rectal cancer Neg Hx     Social History   Socioeconomic History   Marital status: Legally Separated    Spouse name: Not on file   Number of children: 2   Years of education: Not on file   Highest education level: Not on file  Occupational History   Occupation: student    Comment: graduated 2018  Tobacco Use   Smoking status: Current Some Day Smoker   Smokeless tobacco: Never Used  Scientific laboratory technician Use: Never used  Substance and Sexual Activity   Alcohol use: Not Currently    Comment: Rare   Drug use: Not Currently   Sexual activity: Never  Other  Topics Concern   Not on file  Social History Narrative   Not on file   Social Determinants of Health   Financial Resource Strain: Not on file  Food Insecurity: Not on file  Transportation Needs: Not on file  Physical Activity: Not on file  Stress: Not on file  Social Connections: Not on file  Intimate Partner Violence: Not on file    Past Medical History, Surgical history, Social history, and Family history were reviewed and updated as appropriate.   Please see review of systems for further details on the patient's review from today.   Objective:   Physical Exam:  There were no vitals taken for this visit.  Physical Exam Neurological:     Mental Status: She is alert and oriented to person, place, and time.     Cranial Nerves: No dysarthria.  Psychiatric:        Attention and Perception: Attention and perception normal.        Mood and Affect: Mood normal.        Speech: Speech normal.        Behavior: Behavior is cooperative.        Thought Content: Thought content normal. Thought content is not paranoid or delusional. Thought content does not include homicidal or suicidal ideation. Thought content does not include homicidal or suicidal plan.        Cognition and Memory: Cognition and memory normal.        Judgment: Judgment normal.      Comments: Insight intact    Lab Review:     Component Value Date/Time   NA 138 10/03/2020 0511   NA 140 03/27/2017 1543   K 3.6 10/03/2020 0511   CL 111 10/03/2020 0511   CO2 22 10/03/2020 0511   GLUCOSE 146 (H) 10/03/2020 0511   BUN <5 (L) 10/03/2020 0511   BUN 6 03/27/2017 1543   CREATININE 1.02 (H) 10/03/2020 0511   CREATININE 0.95 09/28/2017 1847   CALCIUM 8.5 (L) 10/03/2020 0511   PROT 7.7 09/29/2020 1651   PROT 7.2 03/27/2017 1543   ALBUMIN 4.2 09/29/2020 1651   ALBUMIN 4.5 03/27/2017 1543   AST 14 (L) 09/29/2020 1651   ALT 10 09/29/2020 1651   ALKPHOS 107 09/29/2020 1651   BILITOT 0.7 09/29/2020 1651   BILITOT 0.2 03/27/2017 1543   GFRNONAA >60 10/03/2020 0511   GFRNONAA 70 09/28/2017 1847   GFRAA 82 09/28/2017 1847       Component Value Date/Time   WBC 5.5 10/03/2020 0511   RBC 3.52 (L) 10/03/2020 0511   HGB 9.6 (L) 10/03/2020 0511   HGB 12.5 03/27/2017 1543   HCT 28.9 (L) 10/03/2020 0511   HCT 39.0 03/27/2017 1543   PLT 141 (L) 10/03/2020 0511   PLT 248 03/27/2017 1543   MCV 82.1 10/03/2020 0511   MCV 87 03/27/2017 1543   MCH 27.3 10/03/2020 0511   MCHC 33.2 10/03/2020 0511   RDW 13.5 10/03/2020 0511   RDW 13.6 03/27/2017 1543   LYMPHSABS 1.7 10/03/2020 0511   LYMPHSABS 1.7 03/27/2017 1543   MONOABS 0.3 10/03/2020 0511   EOSABS 0.2 10/03/2020 0511   EOSABS 0.1 03/27/2017 1543   BASOSABS 0.0 10/03/2020 0511   BASOSABS 0.0 03/27/2017 1543    No results found for: POCLITH, LITHIUM   No results found for: PHENYTOIN, PHENOBARB, VALPROATE, CBMZ   .res Assessment: Plan:    Continue:  Cymbalta 120mg  daily Prazosin 2mg  at hs - nightmares Clonazepam 1mg  BID  Continue  therapy with [name]  RTC 3 months  Patient advised to contact office with any questions, adverse effects, or acute worsening in signs and symptoms.  Discussed potential benefits, risk, and side effects of benzodiazepines to include potential risk of tolerance and dependence,  as well as possible drowsiness.  Advised patient not to drive if experiencing drowsiness and to take lowest possible effective dose to minimize risk of dependence and tolerance.  Discussed potential benefits, risks, and side effects of stimulants with patient to include increased heart rate, palpitations, insomnia, increased anxiety, increased irritability, or decreased appetite.  Instructed patient to contact office if experiencing any significant tolerability issues.   Diagnoses and all orders for this visit:  PTSD (post-traumatic stress disorder) -     prazosin (MINIPRESS) 2 MG capsule; Take 1 capsule (2 mg total) by mouth at bedtime. -     DULoxetine (CYMBALTA) 60 MG capsule; Take 2 capsules (120 mg total) by mouth daily.  Insomnia, unspecified type -     prazosin (MINIPRESS) 2 MG capsule; Take 1 capsule (2 mg total) by mouth at bedtime. -     clonazePAM (KLONOPIN) 1 MG tablet; Take 1 tablet (1 mg total) by mouth 2 (two) times daily.  Generalized anxiety disorder -     DULoxetine (CYMBALTA) 60 MG capsule; Take 2 capsules (120 mg total) by mouth daily. -     clonazePAM (KLONOPIN) 1 MG tablet; Take 1 tablet (1 mg total) by mouth 2 (two) times daily.  Major depressive disorder, recurrent episode, moderate (HCC) -     DULoxetine (CYMBALTA) 60 MG capsule; Take 2 capsules (120 mg total) by mouth daily.  Panic disorder with agoraphobia -     clonazePAM (KLONOPIN) 1 MG tablet; Take 1 tablet (1 mg total) by mouth 2 (two) times daily.    Please see After Visit Summary for patient specific instructions.  No future appointments.  No orders of the defined types were placed in this encounter.     -------------------------------

## 2021-01-11 ENCOUNTER — Telehealth: Payer: Self-pay | Admitting: Psychiatry

## 2021-01-11 NOTE — Telephone Encounter (Signed)
Noted. Thank you for the update.

## 2021-01-11 NOTE — Telephone Encounter (Signed)
Admin note for non-service contact  Patient ID: Deanna George  MRN: [id number] DATE: 01/11/2021   Message received this afternoon that student loan disability relief application was approved, finally.  Also in confirmed kidney failure, with nephrectomy scheduled 4/13.  Continuing indication that financial and medical needs preclude therapy at this time.  Remain available at personal discretion.  [name], PhD [name], PhD LP Clinical Psychologist, [facility] [address], [place], [place] [address] [phone]

## 2021-01-19 ENCOUNTER — Telehealth: Payer: Self-pay | Admitting: Nurse Practitioner

## 2021-01-19 NOTE — Telephone Encounter (Signed)
Pt c/o medication issue:  1. Name of Medication: metoprolol tartrate (LOPRESSOR) 25 MG tablet losartan (COZAAR) 50 MG tablet  2. How are you currently taking this medication (dosage and times per day)? 25 MG twice daily, not sure which medication it is.   3. Are you having a reaction (difficulty breathing--STAT)? No   4. What is your medication issue? Deanna George is calling stating her pharmacy has to different doses for one of these two prescriptions. She did not have the medication with her at the time of call to confirm which, but states they had both 50 MG's and 25 MG's. Due to not knowing she has been filling the 25 MG's and taking it twice daily. She is wanting to confirm which dosage she should be on and have a new prescription sent to the pharmacy for no further confusion. She also states she will have the medication at the time of callback to confirm which medication she is calling about.    In addition to the reason for this call Deanna George also wanted to inform our office of what all is currently going on with her health. She states she was diagnosised with chronic kidney failure and is scheduled to have her right kidney removed on 02/23/21, but is also on the emergency backup list due to it being past stage 4. She also states she was diagnosised with diabetes and has been put on two different medications for it. Due to Deanna George's neurological issues she had a episode that caused her to fall and break both of her feet snapping them backwards. One of her feet will not be able to be operated on due to a previous break, but she is currently wearing two boots and using crutches. Her Doctors are wanting to establish her with a  Pain specialist before her surgery for CRPS. Deanna George also stated her BP has been running high, but believes it is due to all she has going on and feels she is regulating it. She wanted our office updated on this due to being her current provider, but is aware of Deanna George retiring and plans to find  a Film/video editor in [place] at a later time. Please advise.

## 2021-01-19 NOTE — Telephone Encounter (Signed)
Called patient about her message. Informed patient that according to her records she is suppose to take metoprolol 25 mg BID and losartan 50 mg BID. Patient stated her BP has been going up here and there, but she has also had new issue come about  (CRF, broken bones in feet, new DM dx). Patient stated she also has lost 15 pounds. Patient is unable to take her BP at home due to no batteries, and patient is having a hard time getting out with both feet broken. Informed patient that if she can get some batteries she could keep a record of her BP's and see if her BP is chronically elevated or it might be due to pain. Patient has new insurance as well, and she wants to know if we except her insurance. Sent message to billing to see if we do. If we do patient will need a new provider in [place], since she has moved there. Patient was going to be assigned to Dr. Johney Frame.  Patient also is in between two PCP due to her insurance. Besides Dr. Tamala Julian, patient stated she is starting to see [facility], Dr. Molli Hazard, [phone]. Will send message to Dr. Johney Frame as Juluis Rainier. Will see what billing says about insurance and will try to help patient from there.

## 2021-02-01 ENCOUNTER — Telehealth: Payer: Self-pay | Admitting: Psychiatry

## 2021-02-01 NOTE — Telephone Encounter (Signed)
[name] made an appt for Deanna George to see you on 02/04/21. She is trying to get her daughter to bring her but not sure if she can. She is having a kidney removed on 4/13 and she also is dealing with two broken feet. She wants to see you before her surgery. Not sure if you could see her another date if her daughter cant bring her?

## 2021-02-01 NOTE — Telephone Encounter (Signed)
Yes, I'll be happy to see her Friday.  Looks like earlier openings have vanished, but Fri afternoon is still good lead time before surgery, and hopefully not on the burner too long with anxiety.    If she can't get a ride, I definitely don't mind converting to telehealth.  If she could better make 1pm Friday, feel free to switch her to that time and turn the time she's in into an urgent-only spot.  Probably going to be a cost issue, now that she's on Medicaid (I'm not a covered provider), but that's for her to say, and I can probably tweak the charge to prevent egregious expenses.  AM

## 2021-02-03 NOTE — Telephone Encounter (Signed)
I left a message for Deanna George to see if her daughter is going to bring her on Friday. She didn't call me back from message two days ago.

## 2021-02-03 NOTE — Telephone Encounter (Signed)
Noted.  Will have to let her work that out, but if she's not here at 4:15, I'll call her.

## 2021-02-04 ENCOUNTER — Ambulatory Visit (INDEPENDENT_AMBULATORY_CARE_PROVIDER_SITE_OTHER): Payer: Medicaid Other | Admitting: Psychiatry

## 2021-02-04 ENCOUNTER — Other Ambulatory Visit: Payer: Self-pay

## 2021-02-04 DIAGNOSIS — R5382 Chronic fatigue, unspecified: Secondary | ICD-10-CM

## 2021-02-04 DIAGNOSIS — F331 Major depressive disorder, recurrent, moderate: Secondary | ICD-10-CM

## 2021-02-04 DIAGNOSIS — G8929 Other chronic pain: Secondary | ICD-10-CM

## 2021-02-04 DIAGNOSIS — F4001 Agoraphobia with panic disorder: Secondary | ICD-10-CM

## 2021-02-04 DIAGNOSIS — N289 Disorder of kidney and ureter, unspecified: Secondary | ICD-10-CM

## 2021-02-04 DIAGNOSIS — M797 Fibromyalgia: Secondary | ICD-10-CM

## 2021-02-04 DIAGNOSIS — F909 Attention-deficit hyperactivity disorder, unspecified type: Secondary | ICD-10-CM

## 2021-02-04 DIAGNOSIS — F4312 Post-traumatic stress disorder, chronic: Secondary | ICD-10-CM

## 2021-02-04 DIAGNOSIS — Z599 Problem related to housing and economic circumstances, unspecified: Secondary | ICD-10-CM

## 2021-02-04 DIAGNOSIS — F5104 Psychophysiologic insomnia: Secondary | ICD-10-CM

## 2021-02-04 DIAGNOSIS — R296 Repeated falls: Secondary | ICD-10-CM

## 2021-02-04 DIAGNOSIS — F819 Developmental disorder of scholastic skills, unspecified: Secondary | ICD-10-CM

## 2021-02-04 DIAGNOSIS — G9332 Myalgic encephalomyelitis/chronic fatigue syndrome: Secondary | ICD-10-CM

## 2021-02-04 DIAGNOSIS — M5412 Radiculopathy, cervical region: Secondary | ICD-10-CM
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
## 2021-02-06 NOTE — Patient Instructions (Addendum)
Transportation for any Medicaid-covered health care appointment: . [facility], affiliated with [employer] [phone]  Postop recovery: . 1st step is ICU . Then either inpatient rehab or nursing home rehabilitation stay . Home, with home health care available - you probably need to talk to [place] Management service  Help organizing: . Get in touch with [facility] social worker - ask surgeon's office to get that started b/c you will need that person to help coordinate . Get in touch with [employer] to notify and learn what they can provide.  CCM services is one program to help manage complex and chronic conditions, can undoubtedly help streamline getting home health and services that can come to you.   Social worker with Primary Care office is: Larey Brick  [address]  [place], [place] [address]  [phone] (Work)  [phone] (Fax)

## 2021-03-08 ENCOUNTER — Encounter: Payer: Self-pay | Admitting: Psychiatry

## 2021-03-08 NOTE — Progress Notes (Signed)
Admin note for non-service contact  Patient ID: Deanna George  MRN: [id number] DATE: 03/08/2021  Word from Pt today that she wants to authorize and encourage her daughter, [name], to be seen by [facility] for purposes of improving their relationship, with the idea that [name] would cover the time.  Barring further clarification or restriction, this is bonafide verbal consent to see family member, either independently as a patient in her own right, or as family therapy without Pt present, and consent to disclose and acknowledge PHI at [facility] discretion in the best interests of both parties.    Staff will respond and clarify billing/payment arrangements and coding issues.  [name], PhD [name], PhD LP Clinical Psychologist, [facility] [address], [place], [place] [address] [phone]

## 2021-03-09 ENCOUNTER — Other Ambulatory Visit: Payer: Self-pay | Admitting: Adult Health

## 2021-03-09 DIAGNOSIS — F411 Generalized anxiety disorder: Secondary | ICD-10-CM

## 2021-03-09 DIAGNOSIS — F4001 Agoraphobia with panic disorder: Secondary | ICD-10-CM

## 2021-03-09 DIAGNOSIS — G4700 Insomnia, unspecified: Secondary | ICD-10-CM

## 2021-03-09 NOTE — Telephone Encounter (Signed)
Pt also needs a refill on her klonopin as well.

## 2021-03-09 NOTE — Telephone Encounter (Signed)
Controlled substance 

## 2021-03-30 ENCOUNTER — Telehealth: Payer: Self-pay | Admitting: *Deleted

## 2021-03-30 NOTE — Telephone Encounter (Signed)
Message left on patient's voicemail request for Metoprolol Tartrate 25 mg fax per [facility], [place], [phone]. Per patient's note she will be transferring to Cardiologist in [place], [place]. Patient was advised to call the office to verify if she has switched provider.

## 2021-04-13 NOTE — Telephone Encounter (Signed)
error 

## 2021-05-13 ENCOUNTER — Telehealth: Payer: Self-pay | Admitting: *Deleted

## 2021-05-13 NOTE — Telephone Encounter (Signed)
*  STAT* If patient is at the pharmacy, call can be transferred to refill team.   1. Which medications need to be refilled? (please list name of each medication and dose if known)  losartan (COZAAR) 50 MG tablet;  metoprolol tartrate (LOPRESSOR) 25 MG tablet  2. Which pharmacy/location (including street and city if local pharmacy) is medication to be sent to?  [id number] [address], [place]  3. Do they need a 30 day or 90 day supply? [place]

## 2021-05-13 NOTE — Telephone Encounter (Signed)
Returned call to pt.  She has been made aware that she has refills at [facility] Pharmacy [facility] [place], good enough until 09/2022.   She thanked me for the call.

## 2021-05-16 ENCOUNTER — Emergency Department: Payer: Medicaid Other

## 2021-05-16 ENCOUNTER — Encounter: Payer: Self-pay | Admitting: *Deleted

## 2021-05-16 ENCOUNTER — Other Ambulatory Visit: Payer: Self-pay

## 2021-05-16 DIAGNOSIS — M25512 Pain in left shoulder: Secondary | ICD-10-CM | POA: Insufficient documentation

## 2021-05-16 DIAGNOSIS — Z85828 Personal history of other malignant neoplasm of skin: Secondary | ICD-10-CM | POA: Insufficient documentation

## 2021-05-16 DIAGNOSIS — K219 Gastro-esophageal reflux disease without esophagitis: Secondary | ICD-10-CM | POA: Insufficient documentation

## 2021-05-16 DIAGNOSIS — G43809 Other migraine, not intractable, without status migrainosus: Secondary | ICD-10-CM | POA: Diagnosis not present

## 2021-05-16 DIAGNOSIS — F17200 Nicotine dependence, unspecified, uncomplicated: Secondary | ICD-10-CM | POA: Diagnosis not present

## 2021-05-16 DIAGNOSIS — I10 Essential (primary) hypertension: Secondary | ICD-10-CM | POA: Insufficient documentation

## 2021-05-16 DIAGNOSIS — Z79899 Other long term (current) drug therapy: Secondary | ICD-10-CM | POA: Diagnosis not present

## 2021-05-16 DIAGNOSIS — M542 Cervicalgia: Secondary | ICD-10-CM | POA: Diagnosis not present

## 2021-05-16 DIAGNOSIS — R0789 Other chest pain: Secondary | ICD-10-CM | POA: Insufficient documentation

## 2021-05-16 LAB — CBC
HCT: 35.1 % — ABNORMAL LOW (ref 36.0–46.0)
Hemoglobin: 11.8 g/dL — ABNORMAL LOW (ref 12.0–15.0)
MCH: 27.4 pg (ref 26.0–34.0)
MCHC: 33.6 g/dL (ref 30.0–36.0)
MCV: 81.6 fL (ref 80.0–100.0)
Platelets: 209 10*3/uL (ref 150–400)
RBC: 4.3 MIL/uL (ref 3.87–5.11)
RDW: 14.8 % (ref 11.5–15.5)
WBC: 11.3 10*3/uL — ABNORMAL HIGH (ref 4.0–10.5)
nRBC: 0 % (ref 0.0–0.2)

## 2021-05-16 LAB — POC URINE PREG, ED: Preg Test, Ur: NEGATIVE

## 2021-05-16 NOTE — ED Triage Notes (Signed)
EMS brings pt in from home for c/o left sided CP that increases with movement accomp by HA

## 2021-05-16 NOTE — ED Notes (Signed)
Patient transported to X-ray 

## 2021-05-16 NOTE — ED Triage Notes (Addendum)
Pt reports that yesterday she had onset of left sided chest pain that radiates up through the neck, left arm, associated with a headache "constant migraine" on the left side. Pain increases with inspiration and movement of the left arm.  Hx of migraines. Pt says she did fall yesterday morning, and caught herself with her right arm.

## 2021-05-17 ENCOUNTER — Emergency Department
Admission: EM | Admit: 2021-05-17 | Discharge: 2021-05-17 | Disposition: A | Discharge status: Home / Self Care | Payer: Medicaid Other | Attending: Emergency Medicine | Admitting: Emergency Medicine

## 2021-05-17 ENCOUNTER — Encounter: Payer: Self-pay | Admitting: Radiology

## 2021-05-17 ENCOUNTER — Emergency Department: Payer: Medicaid Other

## 2021-05-17 DIAGNOSIS — R0789 Other chest pain: Secondary | ICD-10-CM

## 2021-05-17 DIAGNOSIS — M25512 Pain in left shoulder: Secondary | ICD-10-CM

## 2021-05-17 DIAGNOSIS — M542 Cervicalgia: Secondary | ICD-10-CM

## 2021-05-17 DIAGNOSIS — G43809 Other migraine, not intractable, without status migrainosus: Secondary | ICD-10-CM

## 2021-05-17 LAB — HEPATIC FUNCTION PANEL
ALT: 12 U/L (ref 0–44)
AST: 16 U/L (ref 15–41)
Albumin: 4.3 g/dL (ref 3.5–5.0)
Alkaline Phosphatase: 83 U/L (ref 38–126)
Bilirubin, Direct: <0.1 mg/dL (ref 0.0–0.2)
Total Bilirubin: 0.6 mg/dL (ref 0.3–1.2)
Total Protein: 7.9 g/dL (ref 6.5–8.1)

## 2021-05-17 LAB — BASIC METABOLIC PANEL
Anion gap: 10 (ref 5–15)
BUN: 18 mg/dL (ref 6–20)
CO2: 20 mmol/L — ABNORMAL LOW (ref 22–32)
Calcium: 9.4 mg/dL (ref 8.9–10.3)
Chloride: 105 mmol/L (ref 98–111)
Creatinine, Ser: 1.06 mg/dL — ABNORMAL HIGH (ref 0.44–1.00)
GFR, Estimated: >60 mL/min (ref >60)
Glucose, Bld: 134 mg/dL — ABNORMAL HIGH (ref 70–99)
Potassium: 4.2 mmol/L (ref 3.5–5.1)
Sodium: 135 mmol/L (ref 135–145)

## 2021-05-17 LAB — TROPONIN I (HIGH SENSITIVITY)
Troponin I (High Sensitivity): 4 ng/L (ref <18)
Troponin I (High Sensitivity): 4 ng/L (ref <18)

## 2021-05-17 LAB — CK: Total CK: 39 U/L (ref 38–234)

## 2021-05-17 LAB — D-DIMER, QUANTITATIVE: D-Dimer, Quant: 0.95 ug/mL-FEU — ABNORMAL HIGH (ref 0.00–0.50)

## 2021-05-17 LAB — LIPASE, BLOOD: Lipase: 31 U/L (ref 11–51)

## 2021-05-17 MED ORDER — HYDROCODONE-ACETAMINOPHEN 5-325 MG PO TABS: 1.0000 | ORAL_TABLET | Freq: Four times a day (QID) | ORAL | 0 refills | Status: AC | PRN

## 2021-05-17 MED ORDER — DIAZEPAM 2 MG PO TABS: 2.0000 mg | ORAL_TABLET | Freq: Three times a day (TID) | ORAL | 0 refills | Status: DC | PRN

## 2021-05-17 MED ORDER — DROPERIDOL 2.5 MG/ML IJ SOLN
2.5000 mg | Freq: Once | INTRAMUSCULAR | Status: AC
  Administered 2021-05-17: 2.5 mg via INTRAVENOUS
  Filled 2021-05-17: qty 2

## 2021-05-17 MED ORDER — IOHEXOL 350 MG/ML SOLN
150.0000 mL | Freq: Once | INTRAVENOUS | Status: AC | PRN
  Administered 2021-05-17: 150 mL via INTRAVENOUS

## 2021-05-17 MED ORDER — SODIUM CHLORIDE 0.9 % IV BOLUS
1000.0000 mL | Freq: Once | INTRAVENOUS | Status: AC
  Administered 2021-05-17: 1000 mL via INTRAVENOUS

## 2021-05-17 NOTE — ED Notes (Signed)
Pt A&OX4, ambulatory at d/c with independent steady gait, NAD. Pt verbalized understanding of d/c instructions, prescriptions and follow up care. Pt did ask for a taxi voucher; stating she had no ride home. She states her family lives out of state and she just moved to the area 5 months ago and has no local friends she can call. This RN spoke with Raquel, charge RN about a taxi voucher but was told there are none available. Pt informed of this and stated she has a friend in [place] who said they could try to arrange an [place] Popper for her. Pt d/c to the lobby while she finds a ride home. Nurse first made aware of the situation.

## 2021-05-17 NOTE — Discharge Instructions (Addendum)
You may take medicines as needed for pain and muscle spasms (Norco/Valium #15).  Wear sling as needed for comfort.  Return to the ER for worsening symptoms, persistent vomiting, difficulty breathing or other concerns.

## 2021-05-17 NOTE — ED Provider Notes (Signed)
[facility] Emergency Department Provider Note   ____________________________________________   Event Date/Time   First MD Initiated Contact with Patient 05/17/21 0157     (approximate)  I have reviewed the triage vital signs and the nursing notes.   HISTORY  Chief Complaint Chest Pain    HPI Deanna George is a 55 y.o. female brought to the ED via EMS from home with a chief complaint of left-sided chest/shoulder pain which increases with movement, radiation to her left neck up into her head causing a migraine on the left side.  Fall yesterday morning and caught herself with her right arm.  Denies fever, vision changes, cough, shortness of breath, abdominal pain, nausea, vomiting or dizziness.     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    Situational stress    Urinary tract infection     Patient Active Problem List   Diagnosis Date Noted   Gastroenteritis 09/29/2020   Tremor 03/27/2017   Numbness and tingling 02/23/2017   Physiological tremor 02/23/2017   Pelvic pain in female 12/11/2016   Postoperative pain 12/11/2016   Dizziness 08/23/2016   Sensorineural hearing loss (SNHL), bilateral 08/23/2016   Tympanic membrane perforation, marginal, right 08/23/2016   Personal history of colonic polyps 01/24/2013   Allergic contact dermatitis 05/28/2012   Learning difficulty 05/28/2012   Chronic migraine 11/24/2011   Cervical radiculopathy, chronic 11/24/2011   HTN (hypertension) 09/05/2011   Chronic interstitial cystitis 03/01/2011    Neck pain 03/16/2010   Benign essential hypertension 03/16/2010   Diaphragmatic hernia 03/16/2010   Osteoarthrosis involving lower leg 03/16/2010   Stiffness of joint, not elsewhere classified, other specified site 03/16/2010   CARCINOMA, SKIN, SQUAMOUS CELL 02/17/2009   BLOOD IN STOOL 02/17/2009   Right lower quadrant pain 02/17/2009   GERD 12/10/2008   OTHER DYSPHAGIA 12/10/2008    Past Surgical History:  Procedure Laterality Date   BILATERAL OOPHORECTOMY  01/2017   Laparoscopic at [facility] minimally invasive surgery department   BREAST EXCISIONAL BIOPSY Right 1995   CESAREAN SECTION  [id number]   x2   CHOLECYSTECTOMY N/A 01/28/2013   Procedure: LAPAROSCOPIC CHOLECYSTECTOMY WITH INTRAOPERATIVE CHOLANGIOGRAM;  Surgeon: Earnstine Regal, MD;  Location: [facility] ORS;  Service: General;  Laterality: N/A;   COLONOSCOPY     Foot pin post fracture  09/2008   Left foot x 2   KNEE ARTHROSCOPY  2011    right   LAPAROSCOPIC BILATERAL SALPINGECTOMY Bilateral 02/02/2015   Procedure: BILATERAL SALPINGECTOMY, ;  Surgeon: Anastasio Auerbach, MD;  Location: [facility];  Service: Gynecology;  Laterality: Bilateral;   Laparoscopic surgery  01/2007   uterus and ovary x 2   LAPAROSCOPIC VAGINAL HYSTERECTOMY     02/2008   LAPAROSCOPY N/A 02/02/2015   Procedure: LAPAROSCOPY DIAGNOSTIC, FULGERATION ENDOMETREOSIS, EXCISION RIGHT OVARIAN CYST, LYSIS OF ADHESIONS, EXCISION VULVAR AND VAGINAL CYST ;  Surgeon: Anastasio Auerbach, MD;  Location: [facility];  Service: Gynecology;  Laterality: N/A;  MOLE REMOVAL Right 2010   US ECHOCARDIOGRAPHY  07/31/2008   EF 55-60% with mild LVH    Prior to Admission medications   Medication Sig Start Date End Date Taking? Authorizing Provider  diazepam (VALIUM) 2 MG tablet Take 1 tablet (2 mg total) by mouth every 8 (eight) hours as needed for muscle spasms. 05/17/21  Yes Paulette Blanch, MD  HYDROcodone-acetaminophen (NORCO) 5-325 MG tablet Take 1 tablet by mouth every 6 (six) hours as needed for  moderate pain. 05/17/21  Yes Paulette Blanch, MD  clonazePAM (KLONOPIN) 1 MG tablet TAKE 1 TABLET BY MOUTH TWICE A DAY 03/09/21   Mozingo, Berdie Ogren, NP  cyclobenzaprine (FLEXERIL) 10 MG tablet TAKE 1 TABLET BY MOUTH EVERY 8 HOURS AS NEEDED FOR 30 DAYS 07/03/19   [provider]  dicyclomine (BENTYL) 10 MG capsule Take 1 capsule (10 mg total) by mouth 4 (four) times daily -  before meals and at bedtime for 7 days. 10/03/20 10/10/20  Sidney Ace, MD  diphenhydrAMINE (BENADRYL) 25 mg capsule Take 25 mg by mouth every 6 (six) hours as needed for allergies.    [provider]  DULoxetine (CYMBALTA) 60 MG capsule Take 2 capsules (120 mg total) by mouth daily. 12/03/20   Mozingo, Berdie Ogren, NP  fluticasone (FLONASE) 50 MCG/ACT nasal spray Place 2 sprays into both nostrils daily for 7 days. 10/03/20 10/10/20  Sidney Ace, MD  ibuprofen (ADVIL) 800 MG tablet 1 tablet with food or milk as needed    [provider]  loperamide (IMODIUM) 2 MG capsule Take 1 capsule (2 mg total) by mouth as needed for diarrhea or loose stools. 10/03/20   Sreenath, Trula Slade, MD  losartan (COZAAR) 50 MG tablet Take 1 tablet (50 mg total) by mouth in the morning and at bedtime. 09/20/20   Burtis Junes, NP  Melatonin 10 MG CAPS Take 10 mg by mouth at bedtime as needed.     [provider]  metoprolol tartrate (LOPRESSOR) 25 MG tablet TAKE 1 TABLET BY MOUTH TWICE DAILY. 09/20/20   Burtis Junes, NP  omeprazole (PRILOSEC) 40 MG capsule TAKE 1 CAPSULE BY MOUTH EVERY MORNING AND AT BEDTIME 11/15/20   Levin Erp, PA  ondansetron (ZOFRAN) 4 MG tablet Take 1 tablet (4 mg total) by mouth every 6 (six) hours as needed for nausea or vomiting. 01/16/20   Levin Erp, PA  ondansetron (ZOFRAN) 4 MG tablet Take 1 tablet (4 mg total) by mouth every 6 (six) hours as needed for nausea. 10/03/20   Sidney Ace, MD  prazosin (MINIPRESS) 2 MG capsule Take 1 capsule  (2 mg total) by mouth at bedtime. 12/03/20   Mozingo, Berdie Ogren, NP  promethazine (PHENERGAN) 25 MG tablet Take 1 tablet (25 mg total) by mouth every 6 (six) hours as needed for nausea or vomiting. 01/16/20   Levin Erp, PA  rizatriptan (MAXALT-MLT) 10 MG disintegrating tablet Take 1 tablet (10 mg total) by mouth as needed. May repeat in 2 hours if needed 03/06/19   Suzzanne Cloud, NP  SUMAtriptan (IMITREX) 50 MG tablet Take 50 mg by mouth every 2 (two) hours as needed for migraine. May repeat in 2 hours if headache persists or recurs.    [provider]  SUMAtriptan (IMITREX) 6 MG/0.5ML SOLN injection Inject 0.5 mLs (6 mg total) into the skin as needed for migraine. May repeat in 2 hours if headache persists or recurs. 02/13/18   Hassell Done,  Dicky Doe, NP    Allergies Patient has no known allergies.  Family History  Adopted: Yes  Problem Relation Age of Onset   Colon cancer Neg Hx    Pancreatic cancer Neg Hx    Stomach cancer Neg Hx    Rectal cancer Neg Hx     Social History Social History   Tobacco Use   Smoking status: Some Days    Pack years: 0.00   Smokeless tobacco: Never  Vaping Use   Vaping Use: Never used  Substance Use Topics   Alcohol use: Not Currently    Comment: Rare   Drug use: Not Currently    Review of Systems  Constitutional: No fever/chills Eyes: No visual changes. ENT: No sore throat. Cardiovascular: Positive for chest pain. Respiratory: Denies shortness of breath. Gastrointestinal: No abdominal pain.  No nausea, no vomiting.  No diarrhea.  No constipation. Genitourinary: Negative for dysuria. Musculoskeletal: Positive for left shoulder pain.  Negative for back pain. Skin: Negative for rash. Neurological: Positive for headache. Negative for focal weakness or numbness.   ____________________________________________   PHYSICAL EXAM:  VITAL SIGNS: ED Triage Vitals  Enc Vitals Group     BP 05/16/21 2349 (!) 152/118      Pulse Rate 05/16/21 2349 99     Resp 05/16/21 2349 20     Temp 05/16/21 2349 98.9 F (37.2 C)     Temp Source 05/16/21 2349 Oral     SpO2 05/16/21 2340 98 %     Weight --      Height --      Head Circumference --      Peak Flow --      Pain Score 05/16/21 2346 5     Pain Loc --      Pain Edu? --      Excl. in [place]? --     Constitutional: Alert and oriented.  Anxious appearing and in mild acute distress. Eyes: Conjunctivae are normal. PERRL. EOMI. Head: Atraumatic. Nose: No congestion/rhinnorhea. Mouth/Throat: Mucous membranes are moist.   Neck: No stridor.  Supple neck without meningismus.  No carotid bruits. Cardiovascular: Normal rate, regular rhythm. Grossly normal heart sounds.  Good peripheral circulation. Respiratory: Normal respiratory effort.  No retractions. Lungs CTAB.  Anterior chest wall tender to palpation. Gastrointestinal: Soft and nontender to light or deep palpation. No distention. No abdominal bruits. No CVA tenderness. Musculoskeletal:  Left shoulder tender to palpation with limited range of motion secondary to pain.  2+ radial pulses.  Brisk, less than 5-second capillary refill. No lower extremity tenderness nor edema.  No joint effusions. Neurologic: Alert and oriented x3.  CN II to XII grossly intact.  Normal speech and language. No gross focal neurologic deficits are appreciated.  Skin:  Skin is warm, dry and intact. No rash noted. Psychiatric: Mood and affect are anxious. Speech and behavior are normal.  ____________________________________________   LABS (all labs ordered are listed, but only abnormal results are displayed)  Labs Reviewed  BASIC METABOLIC PANEL - Abnormal; Notable for the following components:      Result Value   CO2 20 (*)    Glucose, Bld 134 (*)    Creatinine, Ser 1.06 (*)    All other components within normal limits  CBC - Abnormal; Notable for the following components:   WBC 11.3 (*)    Hemoglobin 11.8 (*)    HCT 35.1 (*)     All other components within normal limits  D-DIMER, QUANTITATIVE - Abnormal; Notable  for the following components:   D-Dimer, Quant 0.95 (*)    All other components within normal limits  HEPATIC FUNCTION PANEL  LIPASE, BLOOD  CK  POC URINE PREG, ED  TROPONIN I (HIGH SENSITIVITY)  TROPONIN I (HIGH SENSITIVITY)   ____________________________________________  EKG  ED ECG REPORT I, Armonee Bojanowski J, the attending physician, personally viewed and interpreted this ECG.   Date: 05/17/2021  EKG Time: 2349  Rate: 94  Rhythm: normal EKG, normal sinus rhythm  Axis: Normal  Intervals:none  ST&T Change: Nonspecific  ____________________________________________  RADIOLOGY I, Ha Placeres J, personally viewed and evaluated these images (plain radiographs) as part of my medical decision making, as well as reviewing the written report by the radiologist.  ED MD interpretation: No acute cardiopulmonary process; negative shoulder x-ray, CT head negative; CTA head/neck unremarkable, CTA chest no PE  Official radiology report(s): CT Angio Head W or Wo Contrast  Result Date: 05/17/2021 CLINICAL DATA:  Headache with neck and chest pain EXAM: CT ANGIOGRAPHY HEAD AND NECK TECHNIQUE: Multidetector CT imaging of the head and neck was performed using the standard protocol during bolus administration of intravenous contrast. Multiplanar CT image reconstructions and MIPs were obtained to evaluate the vascular anatomy. Carotid stenosis measurements (when applicable) are obtained utilizing NASCET criteria, using the distal internal carotid diameter as the denominator. CONTRAST:  157mL OMNIPAQUE IOHEXOL 350 MG/ML SOLN COMPARISON:  None. FINDINGS: CTA NECK FINDINGS Aortic arch: No acute finding or dilatation. Aberrant right subclavian artery. Right carotid system: Vessels are smooth and widely patent with no atheromatous changes. Left carotid system: Vessels are smooth and widely patent with no atheromatous changes.  Vertebral arteries: Vessels are smooth and widely patent with no atheromatous changes. Skeleton: No acute or aggressive finding. Other neck: No noted inflammation or mass in the neck. Upper chest: Negative Review of the MIP images confirms the above findings CTA HEAD FINDINGS Anterior circulation: Vessels are smooth and widely patent Posterior circulation:  Vessels are smooth and widely patent Venous sinuses: Diffusely patent Anatomic variants: None significant Review of the MIP images confirms the above findings IMPRESSION: Unremarkable CTA of the head and neck. Electronically Signed   By: Monte Fantasia M.D.   On: 05/17/2021 04:55   DG Chest 2 View  Result Date: 05/17/2021 CLINICAL DATA:  Chest pain EXAM: CHEST - 2 VIEW COMPARISON:  09/29/2020 FINDINGS: The heart size and mediastinal contours are within normal limits. Both lungs are clear. The visualized skeletal structures are unremarkable. IMPRESSION: No active cardiopulmonary disease. Electronically Signed   By: Ulyses Jarred M.D.   On: 05/17/2021 00:19   CT Head Wo Contrast  Result Date: 05/17/2021 CLINICAL DATA:  Seizure EXAM: CT HEAD WITHOUT CONTRAST TECHNIQUE: Contiguous axial images were obtained from the base of the skull through the vertex without intravenous contrast. COMPARISON:  None. FINDINGS: Brain: There is no mass, hemorrhage or extra-axial collection. The size and configuration of the ventricles and extra-axial CSF spaces are normal. The brain parenchyma is normal, without acute or chronic infarction. Vascular: No abnormal hyperdensity of the major intracranial arteries or dural venous sinuses. No intracranial atherosclerosis. Skull: The visualized skull base, calvarium and extracranial soft tissues are normal. Sinuses/Orbits: No fluid levels or advanced mucosal thickening of the visualized paranasal sinuses. No mastoid or middle ear effusion. The orbits are normal. IMPRESSION: Normal head CT. Electronically Signed   By: Ulyses Jarred M.D.    On: 05/17/2021 02:56   CT Angio Neck W and/or Wo Contrast  Result Date: 05/17/2021 CLINICAL DATA:  Headache with neck and chest pain EXAM: CT ANGIOGRAPHY HEAD AND NECK TECHNIQUE: Multidetector CT imaging of the head and neck was performed using the standard protocol during bolus administration of intravenous contrast. Multiplanar CT image reconstructions and MIPs were obtained to evaluate the vascular anatomy. Carotid stenosis measurements (when applicable) are obtained utilizing NASCET criteria, using the distal internal carotid diameter as the denominator. CONTRAST:  145mL OMNIPAQUE IOHEXOL 350 MG/ML SOLN COMPARISON:  None. FINDINGS: CTA NECK FINDINGS Aortic arch: No acute finding or dilatation. Aberrant right subclavian artery. Right carotid system: Vessels are smooth and widely patent with no atheromatous changes. Left carotid system: Vessels are smooth and widely patent with no atheromatous changes. Vertebral arteries: Vessels are smooth and widely patent with no atheromatous changes. Skeleton: No acute or aggressive finding. Other neck: No noted inflammation or mass in the neck. Upper chest: Negative Review of the MIP images confirms the above findings CTA HEAD FINDINGS Anterior circulation: Vessels are smooth and widely patent Posterior circulation:  Vessels are smooth and widely patent Venous sinuses: Diffusely patent Anatomic variants: None significant Review of the MIP images confirms the above findings IMPRESSION: Unremarkable CTA of the head and neck. Electronically Signed   By: Monte Fantasia M.D.   On: 05/17/2021 04:55   CT Angio Chest PE W/Cm &/Or Wo Cm  Result Date: 05/17/2021 CLINICAL DATA:  Left-sided chest pain radiating into the neck and arm with headache on the left. EXAM: CT ANGIOGRAPHY CHEST WITH CONTRAST TECHNIQUE: Multidetector CT imaging of the chest was performed using the standard protocol during bolus administration of intravenous contrast. Multiplanar CT image reconstructions  and MIPs were obtained to evaluate the vascular anatomy. CONTRAST:  175mL OMNIPAQUE IOHEXOL 350 MG/ML SOLN COMPARISON:  None available FINDINGS: Cardiovascular: Suboptimal opacification of the pulmonary arteries, especially at the segmental level and beyond. No evidence of pulmonary embolism with very limited visualization beyond the proximal segmental level. Normal heart size. No pericardial effusion. No acute aortic finding. Aberrant right subclavian artery. Mediastinum/Nodes: Negative for adenopathy or pneumomediastinum. Lungs/Pleura: Mild atelectasis. There is no edema, consolidation, effusion, or pneumothorax. Upper Abdomen: Partially covered changes of interval right nephrectomy. Musculoskeletal: No acute or aggressive finding Review of the MIP images confirms the above findings. IMPRESSION: Suboptimal pulmonary artery opacification, especially beyond the lobar level. No evidence of pulmonary embolism or other cause for chest pain. Electronically Signed   By: Monte Fantasia M.D.   On: 05/17/2021 04:41   DG Shoulder Left  Result Date: 05/17/2021 CLINICAL DATA:  Pain EXAM: LEFT SHOULDER - 2+ VIEW COMPARISON:  None. FINDINGS: No fracture or dislocation is seen. The joint spaces are preserved. The visualized soft tissues are unremarkable. Visualized left lung is clear. IMPRESSION: Negative. Electronically Signed   By: Julian Hy M.D.   On: 05/17/2021 03:10    ____________________________________________   PROCEDURES  Procedure(s) performed (including Critical Care):  .1-3 Lead EKG Interpretation  Date/Time: 05/17/2021 2:41 AM Performed by: Paulette Blanch, MD Authorized by: Paulette Blanch, MD     Interpretation: normal     ECG rate:  94   ECG rate assessment: normal     Rhythm: sinus rhythm     Ectopy: none     Conduction: normal   Comments:     Patient placed on cardiac monitor to evaluate for arrhythmias   ____________________________________________   INITIAL IMPRESSION /  ASSESSMENT AND PLAN / ED COURSE  As part of my medical decision making, I reviewed the following data within the electronic medical  record:  Nursing notes reviewed and incorporated, Labs reviewed, EKG interpreted, Old chart reviewed, Radiograph reviewed, and Notes from prior ED visits     55 year old female presenting with left chest/shoulder pain, radiation to her neck with migraine headache. Differential diagnosis includes, but is not limited to, ACS, aortic dissection, pulmonary embolism, cardiac tamponade, pneumothorax, pneumonia, pericarditis, myocarditis, GI-related causes including esophagitis/gastritis, and musculoskeletal chest wall pain.     Initial EKG and troponin unremarkable.  Will check LFTs/lipase, repeat troponin, D-dimer.  Obtain noncontrasted CT head for headache.  Consider CTA head/neck/chest once D-dimer results.  Patient appears anxious; will administer droperidol for headache as well as anxiety.  Will reassess.  Clinical Course as of 05/17/21 0555  Tue May 17, 2021  0357 Patient feeling significantly better after droperidol.  Updated her of all imaging and laboratory results.  Will discharge home on analgesia, muscle relaxer, sling and patient will follow up closely with her PCP.  Strict return precautions given.  Patient verbalizes understanding agrees with plan of care. [JS]    Clinical Course User Index [JS] Paulette Blanch, MD     ____________________________________________   FINAL CLINICAL IMPRESSION(S) / ED DIAGNOSES  Final diagnoses:  Chest wall pain  Acute pain of left shoulder  Other migraine without status migrainosus, not intractable  Neck pain     ED Discharge Orders          Ordered    HYDROcodone-acetaminophen (NORCO) 5-325 MG tablet  Every 6 hours PRN        05/17/21 0505    diazepam (VALIUM) 2 MG tablet  Every 8 hours PRN        05/17/21 0505             Note:  This document was prepared using Dragon voice recognition software and may  include unintentional dictation errors.    Paulette Blanch, MD 05/17/21 [phone]

## 2021-05-20 ENCOUNTER — Other Ambulatory Visit: Payer: Self-pay | Admitting: *Deleted

## 2021-05-20 MED ORDER — LOSARTAN POTASSIUM 50 MG PO TABS: 50.0000 mg | ORAL_TABLET | Freq: Two times a day (BID) | ORAL | 11 refills | Status: AC

## 2021-05-26 ENCOUNTER — Telehealth: Payer: Medicaid Other | Admitting: Adult Health

## 2021-05-30 ENCOUNTER — Encounter: Payer: Self-pay | Admitting: Adult Health

## 2021-05-30 ENCOUNTER — Telehealth (INDEPENDENT_AMBULATORY_CARE_PROVIDER_SITE_OTHER): Payer: Medicaid Other | Admitting: Adult Health

## 2021-05-30 DIAGNOSIS — F411 Generalized anxiety disorder: Secondary | ICD-10-CM

## 2021-05-30 DIAGNOSIS — G4700 Insomnia, unspecified: Secondary | ICD-10-CM

## 2021-05-30 DIAGNOSIS — F4310 Post-traumatic stress disorder, unspecified: Secondary | ICD-10-CM

## 2021-05-30 DIAGNOSIS — F331 Major depressive disorder, recurrent, moderate: Secondary | ICD-10-CM

## 2021-05-30 DIAGNOSIS — F4001 Agoraphobia with panic disorder: Secondary | ICD-10-CM

## 2021-05-30 MED ORDER — CLONAZEPAM 1 MG PO TABS: 1.0000 mg | ORAL_TABLET | Freq: Two times a day (BID) | ORAL | 2 refills | Status: DC

## 2021-05-30 MED ORDER — DULOXETINE HCL 60 MG PO CPEP: 120.0000 mg | ORAL_CAPSULE | Freq: Every day | ORAL | 5 refills | Status: DC

## 2021-05-30 MED ORDER — PRAZOSIN HCL 5 MG PO CAPS: 5.0000 mg | ORAL_CAPSULE | Freq: Every day | ORAL | 5 refills | Status: DC

## 2021-05-30 NOTE — Progress Notes (Signed)
Deanna George [id number] 06-30-1966 55 y.o.  Virtual Visit via Video Note  I connected with pt @ on 05/30/21 at  3:20 PM EDT by a video enabled telemedicine application and verified that I am speaking with the correct person using two identifiers.   I discussed the limitations of evaluation and management by telemedicine and the availability of in person appointments. The patient expressed understanding and agreed to proceed.  I discussed the assessment and treatment plan with the patient. The patient was provided an opportunity to ask questions and all were answered. The patient agreed with the plan and demonstrated an understanding of the instructions.   The patient was advised to call back or seek an in-person evaluation if the symptoms worsen or if the condition fails to improve as anticipated.  I provided 10 minutes of non-face-to-face time during this encounter.  The patient was located at home.  The provider was located at [place].   Aloha Gell, NP   Subjective:   Patient ID:  Deanna George is a 55 y.o. (DOB 1966/06/16) female.  Chief Complaint: No chief complaint on file.   HPI Deanna George presents for follow-up of GAD and panic disorder.  Describes mood today as "not so good". Pleasant. Tearful at times. Mood symptoms - reports depression, irritability and anxiety. Reports panic attacks. Stating "I'm doing ok for now". Feels like medications continue to be  helpful. Suffering from chronic pain issues and multiple health issues. Continues to work with Dr. Rica Mote. Varying interest and motivation. Taking medications as prescribed.  Energy levels low. Active, does not have a regular exercise routine.   Unable to enjoy usual interests and activities. Single. Lives alone with cat and birds..  Appetite adequate. Weight loss - 25 pounds - now 194 pounds. Sleeps better some nights than others. Sleep varies day to day. Trying to keep a routine at night.  Focus and  concentration difficulties - ADHD. Completing some tasks. Managing some aspects of household.  Denies SI or HI.  Denies AH or VH.  Previous medications: Ritalin and Thorazine - others, Trazadone   Review of Systems:  Review of Systems  Musculoskeletal:  Negative for gait problem.  Neurological:  Negative for tremors.  Psychiatric/Behavioral:         Please refer to HPI   Medications: I have reviewed the patient's current medications.  Current Outpatient Medications  Medication Sig Dispense Refill   clonazePAM (KLONOPIN) 1 MG tablet Take 1 tablet (1 mg total) by mouth 2 (two) times daily. 60 tablet 2   cyclobenzaprine (FLEXERIL) 10 MG tablet TAKE 1 TABLET BY MOUTH EVERY 8 HOURS AS NEEDED FOR 30 DAYS     dicyclomine (BENTYL) 10 MG capsule Take 1 capsule (10 mg total) by mouth 4 (four) times daily -  before meals and at bedtime for 7 days. 28 capsule 0   diphenhydrAMINE (BENADRYL) 25 mg capsule Take 25 mg by mouth every 6 (six) hours as needed for allergies.     DULoxetine (CYMBALTA) 60 MG capsule Take 2 capsules (120 mg total) by mouth daily. 60 capsule 5   fluticasone (FLONASE) 50 MCG/ACT nasal spray Place 2 sprays into both nostrils daily for 7 days. 15.8 mL 0   HYDROcodone-acetaminophen (NORCO) 5-325 MG tablet Take 1 tablet by mouth every 6 (six) hours as needed for moderate pain. 15 tablet 0   ibuprofen (ADVIL) 800 MG tablet 1 tablet with food or milk as needed     loperamide (IMODIUM) 2  MG capsule Take 1 capsule (2 mg total) by mouth as needed for diarrhea or loose stools. 30 capsule 0   losartan (COZAAR) 50 MG tablet Take 1 tablet (50 mg total) by mouth in the morning and at bedtime. 60 tablet 11   Melatonin 10 MG CAPS Take 10 mg by mouth at bedtime as needed.      metoprolol tartrate (LOPRESSOR) 25 MG tablet TAKE 1 TABLET BY MOUTH TWICE DAILY. 60 tablet 11   omeprazole (PRILOSEC) 40 MG capsule TAKE 1 CAPSULE BY MOUTH EVERY MORNING AND AT BEDTIME 180 capsule 0   ondansetron  (ZOFRAN) 4 MG tablet Take 1 tablet (4 mg total) by mouth every 6 (six) hours as needed for nausea or vomiting. 30 tablet 11   ondansetron (ZOFRAN) 4 MG tablet Take 1 tablet (4 mg total) by mouth every 6 (six) hours as needed for nausea. 20 tablet 0   prazosin (MINIPRESS) 5 MG capsule Take 1 capsule (5 mg total) by mouth at bedtime. 30 capsule 5   promethazine (PHENERGAN) 25 MG tablet Take 1 tablet (25 mg total) by mouth every 6 (six) hours as needed for nausea or vomiting. 30 tablet 11   rizatriptan (MAXALT-MLT) 10 MG disintegrating tablet Take 1 tablet (10 mg total) by mouth as needed. May repeat in 2 hours if needed 15 tablet 4   SUMAtriptan (IMITREX) 50 MG tablet Take 50 mg by mouth every 2 (two) hours as needed for migraine. May repeat in 2 hours if headache persists or recurs.     SUMAtriptan (IMITREX) 6 MG/0.5ML SOLN injection Inject 0.5 mLs (6 mg total) into the skin as needed for migraine. May repeat in 2 hours if headache persists or recurs. 10 vial 4   No current facility-administered medications for this visit.    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    Situational stress    Urinary tract infection     Family History  Adopted: Yes  Problem Relation Age of Onset   Colon cancer Neg Hx    Pancreatic cancer Neg Hx    Stomach cancer Neg Hx    Rectal cancer Neg Hx     Social History   Socioeconomic History   Marital status: Legally Separated    Spouse name: Not on file   Number of children: 2    Years of education: Not on file   Highest education level: Not on file  Occupational History   Occupation: student    Comment: graduated 2018  Tobacco Use   Smoking status: Some Days   Smokeless tobacco: Never  Vaping Use   Vaping Use: Never used  Substance and Sexual Activity   Alcohol use: Not Currently    Comment: Rare   Drug use: Not Currently   Sexual activity: Never  Other Topics Concern   Not on file  Social History Narrative   Not on file  Social Determinants of Health   Financial Resource Strain: Not on file  Food Insecurity: Not on file  Transportation Needs: Not on file  Physical Activity: Not on file  Stress: Not on file  Social Connections: Not on file  Intimate Partner Violence: Not on file    Past Medical History, Surgical history, Social history, and Family history were reviewed and updated as appropriate.   Please see review of systems for further details on the patient's review from today.   Objective:   Physical Exam:  There were no vitals taken for this visit.  Physical Exam  Lab Review:     Component Value Date/Time   NA 135 05/16/2021 2351   NA 140 03/27/2017 1543   K 4.2 05/16/2021 2351   CL 105 05/16/2021 2351   CO2 20 (L) 05/16/2021 2351   GLUCOSE 134 (H) 05/16/2021 2351   BUN 18 05/16/2021 2351   BUN 6 03/27/2017 1543   CREATININE 1.06 (H) 05/16/2021 2351   CREATININE 0.95 09/28/2017 1847   CALCIUM 9.4 05/16/2021 2351   PROT 7.9 05/17/2021 0314   PROT 7.2 03/27/2017 1543   ALBUMIN 4.3 05/17/2021 0314   ALBUMIN 4.5 03/27/2017 1543   AST 16 05/17/2021 0314   ALT 12 05/17/2021 0314   ALKPHOS 83 05/17/2021 0314   BILITOT 0.6 05/17/2021 0314   BILITOT 0.2 03/27/2017 1543   GFRNONAA >60 05/16/2021 2351   GFRNONAA 70 09/28/2017 1847   GFRAA 82 09/28/2017 1847       Component Value Date/Time   WBC 11.3 (H) 05/16/2021 2351   RBC 4.30 05/16/2021 2351   HGB 11.8 (L) 05/16/2021 2351   HGB 12.5 03/27/2017 1543   HCT 35.1 (L)  05/16/2021 2351   HCT 39.0 03/27/2017 1543   PLT 209 05/16/2021 2351   PLT 248 03/27/2017 1543   MCV 81.6 05/16/2021 2351   MCV 87 03/27/2017 1543   MCH 27.4 05/16/2021 2351   MCHC 33.6 05/16/2021 2351   RDW 14.8 05/16/2021 2351   RDW 13.6 03/27/2017 1543   LYMPHSABS 1.7 10/03/2020 0511   LYMPHSABS 1.7 03/27/2017 1543   MONOABS 0.3 10/03/2020 0511   EOSABS 0.2 10/03/2020 0511   EOSABS 0.1 03/27/2017 1543   BASOSABS 0.0 10/03/2020 0511   BASOSABS 0.0 03/27/2017 1543    No results found for: POCLITH, LITHIUM   No results found for: PHENYTOIN, PHENOBARB, VALPROATE, CBMZ   .res Assessment: Plan:    Continue:  Cymbalta 120mg  daily Prazosin 5mg  at hs - nightmares Clonazepam 1mg  BID  Continue therapy with [name]  RTC 6 months  Patient advised to contact office with any questions, adverse effects, or acute worsening in signs and symptoms.  Discussed potential benefits, risk, and side effects of benzodiazepines to include potential risk of tolerance and dependence, as well as possible drowsiness.  Advised patient not to drive if experiencing drowsiness and to take lowest possible effective dose to minimize risk of dependence and tolerance.  Discussed potential benefits, risks, and side effects of stimulants with patient to include increased heart rate, palpitations, insomnia, increased anxiety, increased irritability, or decreased appetite.  Instructed patient to contact office if experiencing any significant tolerability issues.   Diagnoses and all orders for this visit:  Insomnia, unspecified type -     clonazePAM (KLONOPIN) 1 MG tablet; Take 1 tablet (1 mg total) by mouth 2 (two) times daily. -     prazosin (MINIPRESS) 5 MG capsule; Take 1 capsule (5 mg total) by mouth at bedtime.  Generalized anxiety disorder -     clonazePAM (KLONOPIN) 1 MG tablet; Take 1 tablet (1 mg total) by mouth 2 (two) times daily. -     DULoxetine (CYMBALTA) 60 MG capsule; Take 2 capsules  (120 mg total) by mouth daily.  Panic disorder with agoraphobia -     clonazePAM (KLONOPIN) 1 MG tablet; Take 1 tablet (1 mg total) by mouth 2 (two) times daily.  PTSD (post-traumatic stress disorder) -     prazosin (MINIPRESS) 5 MG capsule; Take 1 capsule (5 mg total) by mouth at bedtime. -     DULoxetine (CYMBALTA) 60 MG capsule; Take 2 capsules (120 mg total) by mouth daily.  Major depressive disorder, recurrent episode, moderate (HCC) -     DULoxetine (CYMBALTA) 60 MG capsule; Take 2 capsules (120 mg total) by mouth daily.    Please see After Visit Summary for patient specific instructions.  No future appointments.  No orders of the defined types were placed in this encounter.     -------------------------------

## 2021-06-09 ENCOUNTER — Other Ambulatory Visit: Payer: Self-pay | Admitting: Adult Health

## 2021-06-09 ENCOUNTER — Telehealth: Payer: Self-pay | Admitting: Psychiatry

## 2021-06-09 NOTE — Telephone Encounter (Signed)
Deanna George called in regards to an e-mail she will be sending. She says it will appear as [name] and not [name]. The e-mail will reference that her emotional instability and how she isn't doing very well. She has an appt 8/24 she wanted sooner appt but this was first available. She has been added to Cancellation list. Ph: [phone]

## 2021-06-09 NOTE — Telephone Encounter (Signed)
Deanna George a RTC and she will be coming in tomorrow at 1pm.

## 2021-06-10 ENCOUNTER — Ambulatory Visit: Payer: Medicaid Other | Admitting: Psychiatry

## 2021-06-10 NOTE — Telephone Encounter (Signed)
Request for urgent service received by staff, as only opening for Ester reportedly in September.  Staff member initially offered 1pm spot today, normally held in reserve for crisis service to existing patients, but somehow two staff both booked the same spot, with Korynne's creating a double booking.  Staff contacted Shakia to offer Tuesday 8/2 1pm.  Part of original message states that an email will be coming in her maiden (?) name, apparently with intent of explaining background since last seen or situations she is dealing with.  Not guaranteed that unsolicited writing can be reviewed ahead of time, and caution encouraged about counting on email to be private, timely, and appropriate at the same time, but will see about reading as able and field concerns when they present.  [name], PhD LP Clinical Psychologist, [facility] [address] [place], [place] [address] [phone]

## 2021-06-14 ENCOUNTER — Other Ambulatory Visit: Payer: Self-pay

## 2021-06-14 ENCOUNTER — Ambulatory Visit (INDEPENDENT_AMBULATORY_CARE_PROVIDER_SITE_OTHER): Payer: Self-pay | Admitting: Psychiatry

## 2021-06-14 DIAGNOSIS — F331 Major depressive disorder, recurrent, moderate: Secondary | ICD-10-CM

## 2021-06-14 DIAGNOSIS — Z599 Problem related to housing and economic circumstances, unspecified: Secondary | ICD-10-CM

## 2021-06-14 DIAGNOSIS — F4001 Agoraphobia with panic disorder: Secondary | ICD-10-CM

## 2021-06-14 DIAGNOSIS — Z905 Acquired absence of kidney: Secondary | ICD-10-CM

## 2021-06-14 DIAGNOSIS — F4312 Post-traumatic stress disorder, chronic: Secondary | ICD-10-CM

## 2021-06-14 DIAGNOSIS — M797 Fibromyalgia: Secondary | ICD-10-CM

## 2021-06-14 DIAGNOSIS — F5104 Psychophysiologic insomnia: Secondary | ICD-10-CM

## 2021-06-14 DIAGNOSIS — G9332 Myalgic encephalomyelitis/chronic fatigue syndrome: Secondary | ICD-10-CM

## 2021-06-14 DIAGNOSIS — F411 Generalized anxiety disorder: Secondary | ICD-10-CM

## 2021-06-14 DIAGNOSIS — Z62820 Parent-biological child conflict: Secondary | ICD-10-CM

## 2021-06-14 DIAGNOSIS — R5382 Chronic fatigue, unspecified: Secondary | ICD-10-CM

## 2021-06-14 NOTE — Progress Notes (Signed)
Psychotherapy Progress Note [facility] 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  Still lives in [place], medical services through Deanna George, and on [place].  Has come through broken feet, nephrectomy, hematoma, 6 surgeries, had a heart attack scare with markers of bodily response to blood clot.  In the past year, sold house and security, lived in [place], eventually worked through vastly complicated disability filing and is now on [employer].  Dx'Deanna diabetes.  Wants to reestablish regular therapy, no matter what the cost, due to familiarity and competency with her case.    Particularly wants help resolving strife with Deanna George, reportedly willing to come in as a patient herself for the sake of affordability and getting their boundaries worked out.  Finds George has been speaking up more about being "afraid" and needing Deanna George to "respect boundaries", but she doesn't know what this means, it just feels like George is turning on her.  Finding that George has become feisty, in a way that suggests she is maybe making up for lost time in adolescent rebellion.  C/o a lot of her telling Deanna George to not say anything, and if she has any negative feelings, it's on her.  Last week an over-tense encounter helping George move after she had thoracic outlet surgery, while having a panic attack herself.  Mid-June, 2 days after George's potentially life-threatening surgery, Deanna George showed up, and Deanna George seriously considered suicide by multiple  methods, eventually talked herself into refraining.    C/o social work issues -- SNAP benefits are fouled up, $800/mo fixed income.  Medically, having exacerbated IBS and is involved with the [facility] program.  Can't stomach meat or vegetables now.  Medications remain in service through [facility], DNP of this office, but not clear on what terms or whether she will be able to continue.  Discussed at length need for affordable services but steadfast in seeking continued service here.    Therapeutic modalities: Cognitive Behavioral Therapy and Solution-Oriented/Positive Psychology  Mental Status/Observations:  Appearance:   Casual     Behavior:  Appropriate and Rigid  Motor:  Normal  Speech/Language:   Clear and Coherent  Affect:  Appropriate  Mood:  anxious and dysthymic  Thought process:  normal  Thought content:    Obsessions  Sensory/Perceptual disturbances:    WNL  Orientation:  Fully oriented  Attention:  Good    Concentration:  Fair  Memory:  WNL  Insight:    Good  Judgment:   Fair  Impulse Control:  Good   Risk Assessment: Danger to Self: No Self-injurious Behavior: No Danger to Others: No Physical Aggression / Violence: No Duty to Warn: No Access to Firearms a concern: No  Assessment of progress:  situational setback(s)  Diagnosis:   ICD-10-CM   1. Major depressive disorder, recurrent episode, moderate (HCC)  F33.1     2. Panic disorder with agoraphobia and moderate panic attacks  F40.01     3. Chronic post-traumatic stress disorder (PTSD)  F43.12     4. Generalized anxiety disorder  F41.1     5. Financial difficulties  Z59.9     6. Chronic fatigue syndrome with fibromyalgia  R53.82    M79.7     7. Psychophysiological insomnia  F51.04     8. History of nephrectomy  Z90.5     9. Relationship problem between parent and child  Z62.820      Plan:  May continue services here on a basis TBD, but expected to put herself in touch with her  county's resources, approaching Deanna George for herself Ensure established PCP as well as needed specialists Should engage a medical social worker through her current medical care [facility]) ASAP. Willing to see her and George together if George truly consents.  If it suits purposes, can see George individually to better assess and clarify purposes.  Verbal consent to disclose and interpret Deanna George to her at discretion. Other recommendations/advice as may be noted above Continue to utilize previously learned skills ad lib Maintain medication as prescribed and work faithfully with relevant prescriber(s) if any changes are desired or seem indicated Call the clinic on-call service, present to ER, or call 911 if any life-threatening psychiatric crisis Return for time as available. Already scheduled visit in this office 07/06/2021.  Deanna Serve, PhD Deanna Moore, PhD LP Clinical Psychologist, [facility] [address], [place], [place] [address] [phone]

## 2021-06-17 ENCOUNTER — Telehealth: Payer: Self-pay | Admitting: Physician Assistant

## 2021-06-17 MED ORDER — OMEPRAZOLE 40 MG PO CPDR: 40.0000 mg | DELAYED_RELEASE_CAPSULE | Freq: Two times a day (BID) | ORAL | 0 refills | Status: AC

## 2021-06-17 NOTE — Telephone Encounter (Signed)
Pt calling requesting refill for Prilosec. Pt advised to schedule appt, pt states she is requesting refill/not sure her insurance will cover ofc visit.. plz advise

## 2021-06-17 NOTE — Telephone Encounter (Signed)
I sent in 30 days for her. We do not take her currently shown insurance so she will need to find another GI specialist or have her PCP do her refills from here on out.

## 2021-07-06 ENCOUNTER — Ambulatory Visit (INDEPENDENT_AMBULATORY_CARE_PROVIDER_SITE_OTHER): Payer: Medicaid Other | Admitting: Psychiatry

## 2021-07-06 ENCOUNTER — Other Ambulatory Visit: Payer: Self-pay

## 2021-07-06 DIAGNOSIS — Z599 Problem related to housing and economic circumstances, unspecified: Secondary | ICD-10-CM

## 2021-07-06 DIAGNOSIS — F331 Major depressive disorder, recurrent, moderate: Secondary | ICD-10-CM

## 2021-07-06 DIAGNOSIS — F5104 Psychophysiologic insomnia: Secondary | ICD-10-CM

## 2021-07-06 DIAGNOSIS — G4700 Insomnia, unspecified: Secondary | ICD-10-CM

## 2021-07-06 DIAGNOSIS — F4312 Post-traumatic stress disorder, chronic: Secondary | ICD-10-CM

## 2021-07-06 DIAGNOSIS — F4001 Agoraphobia with panic disorder: Secondary | ICD-10-CM

## 2021-07-06 DIAGNOSIS — G8929 Other chronic pain: Secondary | ICD-10-CM

## 2021-07-06 DIAGNOSIS — F411 Generalized anxiety disorder: Secondary | ICD-10-CM

## 2021-07-06 DIAGNOSIS — Z62820 Parent-biological child conflict: Secondary | ICD-10-CM

## 2021-07-06 DIAGNOSIS — M797 Fibromyalgia: Secondary | ICD-10-CM

## 2021-07-06 DIAGNOSIS — G9332 Myalgic encephalomyelitis/chronic fatigue syndrome: Secondary | ICD-10-CM

## 2021-07-06 DIAGNOSIS — Z905 Acquired absence of kidney: Secondary | ICD-10-CM
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
## 2021-07-15 ENCOUNTER — Ambulatory Visit: Payer: Medicaid Other | Admitting: Psychiatry

## 2021-07-25 ENCOUNTER — Ambulatory Visit: Payer: Medicaid Other | Admitting: Psychiatry

## 2021-08-08 ENCOUNTER — Ambulatory Visit: Payer: Medicaid Other | Admitting: Psychiatry

## 2021-08-16 ENCOUNTER — Telehealth: Payer: Self-pay | Admitting: Psychiatry

## 2021-08-16 NOTE — Telephone Encounter (Signed)
Deanna George, I know Deanna George has given you the info wants you to fill out.  Conley also advised that you wrote a letter for her in the past, that she would like included in the info you are going to send back to [place] at [facility].  [name], as I discussed with you, she wants to know when she first started being seen here so she can put a more exact date when she has to fill out paperwork.  She also stated a Dr. [name] wrote a letter on her behalf when she first started coming here.  She doesn't remember when.  I wasn't sure if Eustaquio Maize might find it in electronic files.  She thinks that letter might be helpful to include in the into to [facility].  If you can't send it by fax due to HIPPA, she can come pick it up, but she has a lot of anxiety and would like to avoid coming out if possible.

## 2021-08-16 NOTE — Telephone Encounter (Signed)
Received fax from [facility] to complete Attending Physician's Statement of Disability Form. Given to [employer] to complete.

## 2021-08-17 NOTE — Telephone Encounter (Signed)
Hello, will you still look up the first date she started so I can give her that info.  I don't know how hard it is to find if we have the [facility] letter or if we just let it go based on what Jonni Sanger said.   Then I guess I should call her back with the info

## 2021-08-17 NOTE — Telephone Encounter (Signed)
Deanna George, before I call her back, have you faxed the info or does she need to come pick it up?
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
## 2021-08-22 ENCOUNTER — Ambulatory Visit: Payer: Medicaid Other | Admitting: Psychiatry

## 2021-08-22 NOTE — Telephone Encounter (Signed)
Just wanted to make sure we ended up on the same page about this.  You were going to call Dezaree yourself rather than have me be the "intermediary" and lose more time resolving her issues.

## 2021-09-19 ENCOUNTER — Other Ambulatory Visit: Payer: Self-pay | Admitting: Adult Health

## 2021-09-19 DIAGNOSIS — F331 Major depressive disorder, recurrent, moderate: Secondary | ICD-10-CM

## 2021-09-19 DIAGNOSIS — F411 Generalized anxiety disorder: Secondary | ICD-10-CM

## 2021-09-19 DIAGNOSIS — F4310 Post-traumatic stress disorder, unspecified: Secondary | ICD-10-CM

## 2021-09-20 NOTE — Telephone Encounter (Signed)
Appt due in January

## 2021-09-21 ENCOUNTER — Ambulatory Visit (INDEPENDENT_AMBULATORY_CARE_PROVIDER_SITE_OTHER): Payer: Medicaid Other | Admitting: Psychiatry

## 2021-09-21 ENCOUNTER — Other Ambulatory Visit: Payer: Self-pay

## 2021-09-21 DIAGNOSIS — F819 Developmental disorder of scholastic skills, unspecified: Secondary | ICD-10-CM

## 2021-09-21 DIAGNOSIS — F331 Major depressive disorder, recurrent, moderate: Secondary | ICD-10-CM

## 2021-09-21 DIAGNOSIS — M797 Fibromyalgia: Secondary | ICD-10-CM

## 2021-09-21 DIAGNOSIS — F5104 Psychophysiologic insomnia: Secondary | ICD-10-CM

## 2021-09-21 DIAGNOSIS — F909 Attention-deficit hyperactivity disorder, unspecified type: Secondary | ICD-10-CM

## 2021-09-21 DIAGNOSIS — Z599 Problem related to housing and economic circumstances, unspecified: Secondary | ICD-10-CM

## 2021-09-21 DIAGNOSIS — F4001 Agoraphobia with panic disorder: Secondary | ICD-10-CM

## 2021-09-21 DIAGNOSIS — G8929 Other chronic pain: Secondary | ICD-10-CM

## 2021-09-21 DIAGNOSIS — F4312 Post-traumatic stress disorder, chronic: Secondary | ICD-10-CM

## 2021-09-21 DIAGNOSIS — G9332 Myalgic encephalomyelitis/chronic fatigue syndrome: Secondary | ICD-10-CM
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
## 2021-10-04 ENCOUNTER — Other Ambulatory Visit: Payer: Self-pay | Admitting: Adult Health

## 2021-10-04 DIAGNOSIS — F411 Generalized anxiety disorder: Secondary | ICD-10-CM

## 2021-10-04 DIAGNOSIS — F331 Major depressive disorder, recurrent, moderate: Secondary | ICD-10-CM

## 2021-10-04 DIAGNOSIS — F4310 Post-traumatic stress disorder, unspecified: Secondary | ICD-10-CM

## 2021-10-12 ENCOUNTER — Ambulatory Visit: Payer: Medicaid Other | Admitting: Psychiatry

## 2021-10-12 ENCOUNTER — Telehealth: Payer: Self-pay | Admitting: Psychiatry

## 2021-10-12 NOTE — Telephone Encounter (Signed)
Admin note for non-service contact  Patient ID: Deanna George  MRN: [id number] DATE: 10/12/2021  TC on request to clarify how to hold (hopefully) family session scheduled for Friday.  Worried whether they will be on the same track, obsessing about what to bring up, responding to sensitive issues, and answering D's call for agenda without inadvertently alienating her.  Provided guidance preparing, centering on distilling her own goals and things she needs to say, making sure not to [facility] the time, and focusing on active listening, including feeding back what she makes of critical feedback so far and owning what she can discern so far about what behavior has pressured or alienated [name] before.  Assured that outcries about breaking her "boundaries" are probably much more a blunt way of trying to say she feels too closed in at the moment and needs space, not necessarily "rule violations".  News on her welfare that [name] did help her get to the [facility] office, and [name] has given her props for practicing the ability to   [name], PhD [name], PhD LP Clinical Psychologist, [facility] [address], [place], [place] [address] [phone]

## 2021-10-14 ENCOUNTER — Ambulatory Visit (INDEPENDENT_AMBULATORY_CARE_PROVIDER_SITE_OTHER): Payer: Self-pay | Admitting: Psychiatry

## 2021-10-14 DIAGNOSIS — F4312 Post-traumatic stress disorder, chronic: Secondary | ICD-10-CM

## 2021-10-14 DIAGNOSIS — F4001 Agoraphobia with panic disorder: Secondary | ICD-10-CM

## 2021-10-14 DIAGNOSIS — Z62820 Parent-biological child conflict: Secondary | ICD-10-CM

## 2021-10-14 DIAGNOSIS — F331 Major depressive disorder, recurrent, moderate: Secondary | ICD-10-CM

## 2021-10-14 DIAGNOSIS — F5104 Psychophysiologic insomnia: Secondary | ICD-10-CM

## 2021-10-14 DIAGNOSIS — Z599 Problem related to housing and economic circumstances, unspecified: Secondary | ICD-10-CM

## 2021-10-14 NOTE — Progress Notes (Addendum)
Psychotherapy Progress Note [facility] [name], PhD LP  Patient ID: Deanna George)    MRN: [id number] Therapy format: Family therapy w/ patient -- accompanied by [name] Date: 10/14/2021      Start: 1:05p     Stop: 2:02p     Time Spent: 57 min -- pro bono Location: In-person   Session narrative (presenting needs, interim history, self-report of stressors and symptoms, applications of prior therapy, status changes, and interventions made in session) Conjoint meeting with daughter, Summer, with significant delay since seeing Summer separately by video x2, then in and out of contact addressing admin issues concerning disability benefits including SNAP and HOI WOP provision.  Natoria is clearly nervous, Summer notably stonefaced and controled from the outset and throughout, really.  Janitza opens professing an epiphany in recent times, waking up to how stressful it must have been, for some time now, to be her daughter/nearly adult child.  Trying to work, as asked by [employer], on mastering pronouns (nonbinary, you/they/them).  And conforming to their "boundaries".  This includes not being interrupted by Lattie Haw.  Discussion illuminated that Summer in fact has come on strong about how Jennise will behave if she wants attention and help, and while the subject was not discussed today, it does match Tamra's suggestion that the gender-informed counselor Summer sees may well advocate approaching boundaries more as "my requirements on you" than contracts between adults.  Stated on Summer's part that they do feel very guarded meeting with their mother and the session itself is very tense-making, but they are up to it.  Apparently Summer has accompanied Saba at this point on a trip to [employer] to symbolically give up grievances together, since it is so meaningful to [place], and plans to help her sign up for benefits she has not yet applied through [employer].  Affirmed help, and appreciation  for it, and confirmed limited availability to accompany Mylan to other things, with need to make her own connections to her own social work.  Mostly addressed process, easing down Chaitra's nerves, clarifying Summer's messages to help decatastrophize, and helping Shanasia take stiffer moments with a rain of salt.  Acknowledged that Maddyson is very well conditioned to fear the worst happening (I.e., abandonment), it goes off like a reflex, and it is not so choose-able as Summer would think or like, but also that Aashvi has more insight than she might portray, and she can grow in catching herself, asking questions and restating things, too.  Framed several guidelines for healthier communication and conflict resolution, mainly addressed to [place], including clearly taking turns, biting her tongue in the face of criticism or unfair sounding things, trying perception/message checks to drive down the feeling of quick-reacting, thinking or openly saying "I can wait", and trusting tat the harshest sounding messages don't necessarily mean "Thou shalt" commandments but could be ways Summer is trying to convey or cope with strong feelings without being necessarily direct and vulnerable about them.  Especially guided Yarimar to use that feeling of about to be overwhelmed as a good clue to pause.  Advocated a "can we talk?" opening for each to use, but especially Dhruti, and used speaking to [place] to convey to Summer how her stiff exterior ([place]ul[place]-like, in my observation) maybe is both better understood, better accepted, and less necessary than it seems in relation to their mother.  Confirmed understandings and willingness to use clarifying and process controls, benevolently predicted there will be much discomfort and  slipping around on these before they take well, so please be forgiving of each other trying to change reflexes.  Confirmed worth the time for Summer who took a day off and drive from the [place] for this.  Therapeutic  modalities: Cognitive Behavioral Therapy, Solution-Oriented/Positive Psychology, Assertiveness/Communication, and Interpersonal  Mental Status/Observations:  Appearance:   Casual     Behavior:  Appropriate  Motor:  Normal  Speech/Language:   Clear and Coherent  Affect:  Appropriate  Mood:  anxious and tense  Thought process:  normal  Thought content:    Obsessions  Sensory/Perceptual disturbances:    WNL  Orientation:  Fully oriented  Attention:  Good    Concentration:  Good  Memory:  WNL  Insight:    Good  Judgment:   Good  Impulse Control:  Good   Risk Assessment: Danger to Self: No Self-injurious Behavior: No Danger to Others: No Physical Aggression / Violence: No Duty to Warn: No Access to Firearms a concern: No  Assessment of progress:  progressing  Diagnosis:   ICD-10-CM   1. 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 Relationship problem between parent and child  Z62.820     3. Chronic post-traumatic stress disorder (PTSD)  F43.12     4. Major depressive disorder, recurrent episode, moderate (HCC)  F33.1     5. Financial difficulties  Z59.9     6. Psychophysiological insomnia  F51.04      Plan:  Apply communication guidelines as noted Other recommendations/advice as may be noted above Continue to utilize previously learned skills ad lib Maintain medication as prescribed and work faithfully with relevant prescriber(s) if any changes are desired or seem indicated Call the clinic on-call service, 988/hotline, 911, or present to [facility] or ER if any life-threatening psychiatric crisis Return for session(s) already scheduled. Already scheduled visit in this office 11/02/2021.  [name], PhD [name], PhD LP Clinical Psychologist, [facility] [address], [place], [place] [address] [phone]

## 2021-10-18 ENCOUNTER — Ambulatory Visit: Payer: Medicaid Other | Admitting: Psychiatry

## 2021-10-20 ENCOUNTER — Ambulatory Visit: Payer: Medicaid Other | Admitting: Psychiatry

## 2021-10-20 ENCOUNTER — Other Ambulatory Visit: Payer: Self-pay

## 2021-10-20 DIAGNOSIS — Z62820 Parent-biological child conflict: Secondary | ICD-10-CM

## 2021-10-20 DIAGNOSIS — F4001 Agoraphobia with panic disorder: Secondary | ICD-10-CM

## 2021-10-20 DIAGNOSIS — F909 Attention-deficit hyperactivity disorder, unspecified type: Secondary | ICD-10-CM

## 2021-10-20 DIAGNOSIS — Z599 Problem related to housing and economic circumstances, unspecified: Secondary | ICD-10-CM

## 2021-10-20 DIAGNOSIS — G8929 Other chronic pain: Secondary | ICD-10-CM

## 2021-10-20 DIAGNOSIS — F5104 Psychophysiologic insomnia: Secondary | ICD-10-CM

## 2021-10-20 DIAGNOSIS — F331 Major depressive disorder, recurrent, moderate: Secondary | ICD-10-CM

## 2021-10-20 DIAGNOSIS — M797 Fibromyalgia: Secondary | ICD-10-CM

## 2021-10-20 DIAGNOSIS — F4312 Post-traumatic stress disorder, chronic: Secondary | ICD-10-CM

## 2021-10-20 NOTE — Progress Notes (Signed)
Psychotherapy Progress Note [facility] [name], PhD LP  Patient ID: AKILI CUDA)    MRN: [id number] Therapy format: Individual psychotherapy Date: 10/20/2021      Start: 1:17p     Stop: 2:04p     Time Spent: 47 min -- pro bono Location: In-person   Session narrative (presenting needs, interim history, self-report of stressors and symptoms, applications of prior therapy, status changes, and interventions made in session) Several agendas -- needs help printing documents for SSI.  Did get SNAP benefits re-approved ([place]), but has to go through regional SSI office [facility]) to rectify.  Due to IT issues, cannot print for her, recommended to use publicly available services.    New concern with Summer -- has tried to let her take her exits from tension, make sure she doesn't add urgency to implied conflict, but new provocation, seems to signal really losing her now.  Says she took a call from Summer in which Summer continued to use very formalized language, was on speaker phone, with Summer's girlfriend explicitly present, and Summer announced that she (they) will be going skiing with her (their) counselor in a few weeks.  Apparently counselor has arranged a therapeutic group trip to include skiing, ostensibly underwritten by counselor somehow.  Worry largely that counselor is grooming, establishing herself as substitute parent, more or less cult-style, and that her continuing influence will be to completely alienate Summer from her -- in effect cancel her as mother.    Discussed at length signs and interpretations, reassuring her that Summer is both clearly involved in her own relationship and so clearly guarded about being manipulated -- especially by a female with authority -- that she would not let another woman snow her into any kind of a dependent relationship, be it romantic or quasi-parental.  If anything, she is liable to have trouble conducting a  relationship the guardedness and control needs she seems to carry.  Encouraged further in letting Summer unfold her/themself and just make sure to keep her own self-presentation nondemanding, concede Summer's authority to decide their own path and boundaries, and urge only to take care of Carol's child, whatever shape that takes.    Therapeutic modalities: Cognitive Behavioral Therapy, Solution-Oriented/Positive Psychology, and Ego-Supportive  Mental Status/Observations:  Appearance:   Casual     Behavior:  Appropriate  Motor:  Normal  Speech/Language:   Clear and Coherent  Affect:  Appropriate  Mood:  anxious and tense  Thought process:  normal  Thought content:    Obsessions  Sensory/Perceptual disturbances:    WNL  Orientation:  Fully oriented  Attention:  Good    Concentration:  Fair  Memory:  WNL  Insight:    Good  Judgment:   Good  Impulse Control:  Fair   Risk Assessment: Danger to Self: No Self-injurious Behavior: No Danger to Others: No Physical Aggression / Violence: No Duty to Warn: No Access to Firearms a concern: No  Assessment of progress:  stabilized  Diagnosis:   ICD-10-CM   1. 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 Relationship problem between parent and child  Z62.820     3. Chronic post-traumatic stress disorder (PTSD)  F43.12     4. Major depressive disorder, recurrent episode, moderate (HCC)  F33.1     5. Financial difficulties  Z59.9     6. Psychophysiological insomnia  F51.04     7. Chronic fatigue syndrome with fibromyalgia  G93.32  M79.7     8. Other chronic pain - multiple causes  G89.29     9. Attention deficit hyperactivity disorder (ADHD), unspecified ADHD type  F90.9      Plan:  Self-assure Summer will look after their own boundaries just as vigilantly with other women, including counselor, as she will with [employer] on showing [employer] alienation, but don't try to defend it or  portray victimhood with them Stay focused on working through benefits, social services, and health care as independently as possible and connecting to nonfamily supports available Other recommendations/advice as may be noted above Continue to utilize previously learned skills ad lib Maintain medication as prescribed and work faithfully with relevant prescriber(s) if any changes are desired or seem indicated Call the clinic on-call service, 988/hotline, 911, or present to [facility] or ER if any life-threatening psychiatric crisis Return for time as available. Already scheduled visit in this office 11/15/2021.  [name], PhD [name], PhD LP Clinical Psychologist, [facility] [address] [place], [name] [address] [phone]

## 2021-10-21 ENCOUNTER — Ambulatory Visit: Payer: Medicaid Other | Admitting: Psychiatry

## 2021-10-27 ENCOUNTER — Encounter: Payer: Self-pay | Admitting: Gastroenterology

## 2021-10-27 NOTE — H&P (Signed)
Pre-Procedure H&P   Patient ID: Deanna George is a 55 y.o. female.  Gastroenterology Provider: Annamaria Helling, DO  Referring Provider: Laurine Blazer, PA PCP: Azucena Cecil, MD  Date: 10/28/2021  HPI Ms. Deanna George is a 55 y.o. female who presents today for Esophagogastroduodenoscopy for IBS, diarrhea, nausea, vomiting, weight loss.  Pt with worsening nausea and vomiting for over a year. Pt reports 20 lb weight loss. Becomes nauseous with food, even the smell of it. Eating causes post prandial pain.  Post prandial diarrhea; on omeprazole 40 mg daily. Has not trialed bile acid sequestrants post cholecystectomy Taking multiple centrally acting therapeutic agents.  Underwent lap nephrectomy April 2022 with no change to sx.  Previous CT of GI organs in 2021 noted. Hgb 12.1, cr 0.95, lipase 30 a1c 7.3  2017 and 2015 egd- variable z line negative for BE. 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    Situational stress    Urinary tract infection     Past Surgical History:  Procedure Laterality Date   BILATERAL OOPHORECTOMY  01/2017   Laparoscopic at [facility] minimally invasive surgery department   BREAST EXCISIONAL BIOPSY Right 1995   CESAREAN SECTION  [id number]   x2   CHOLECYSTECTOMY N/A 01/28/2013   Procedure: LAPAROSCOPIC CHOLECYSTECTOMY WITH INTRAOPERATIVE CHOLANGIOGRAM;  Surgeon: Earnstine Regal, MD;  Location: [facility];  Service: General;  Laterality: N/A;    COLONOSCOPY     CYSTOSTOMY W/ BLADDER DILATION     ESOPHAGUS SURGERY     Foot pin post fracture  09/2008   Left foot x 2   KNEE ARTHROSCOPY  2011   right   LAPAROSCOPIC BILATERAL SALPINGECTOMY Bilateral 02/02/2015   Procedure: BILATERAL SALPINGECTOMY, ;  Surgeon: Anastasio Auerbach, MD;  Location: [facility];  Service: Gynecology;  Laterality: Bilateral;   LAPAROSCOPIC NEPHRECTOMY Right    Laparoscopic surgery  01/2007   uterus and ovary x 2   LAPAROSCOPIC VAGINAL HYSTERECTOMY     02/2008   LAPAROSCOPY N/A 02/02/2015   Procedure: LAPAROSCOPY DIAGNOSTIC, FULGERATION ENDOMETREOSIS, EXCISION RIGHT OVARIAN CYST, LYSIS OF ADHESIONS, EXCISION VULVAR AND VAGINAL CYST ;  Surgeon: Anastasio Auerbach, MD;  Location: [facility];  Service: Gynecology;  Laterality: N/A;   MOLE REMOVAL Right 2010   US ECHOCARDIOGRAPHY  07/31/2008   EF 55-60% with mild LVH    Family History Unknown- pt adopted  Review of Systems  Constitutional:  Positive for unexpected weight change. Negative for activity change, appetite change, chills, diaphoresis, fatigue and fever.  HENT:  Negative for trouble swallowing and voice change.   Respiratory:  Negative for shortness of breath and wheezing.   Cardiovascular:  Negative for chest pain, palpitations and leg swelling.  Gastrointestinal:  Positive for abdominal pain, diarrhea, nausea and vomiting. Negative for abdominal distention, anal bleeding, blood in stool, constipation and rectal  pain.  Musculoskeletal:  Negative for arthralgias and myalgias.  Skin:  Negative for color change and pallor.  Neurological:  Negative for dizziness, syncope and weakness.  Psychiatric/Behavioral:  Negative for confusion.   All other systems reviewed and are negative.   Medications No current facility-administered medications on file prior to encounter.   Current Outpatient Medications on File Prior to Encounter  Medication Sig Dispense Refill   DULoxetine (CYMBALTA) 60 MG capsule TAKE 2  CAPSULES BY MOUTH DAILY 60 capsule 2   empagliflozin (JARDIANCE) 25 MG TABS tablet Take 25 mg by mouth daily.     hyoscyamine (LEVSIN SL) 0.125 MG SL tablet Place 0.125 mg under the tongue every 6 (six) hours as needed for cramping.     losartan (COZAAR) 50 MG tablet Take 1 tablet (50 mg total) by mouth in the morning and at bedtime. 60 tablet 11   metoprolol tartrate (LOPRESSOR) 25 MG tablet TAKE 1 TABLET BY MOUTH TWICE DAILY. 60 tablet 11   omeprazole (PRILOSEC) 40 MG capsule Take 1 capsule (40 mg total) by mouth in the morning and at bedtime. 60 capsule 0   ondansetron (ZOFRAN) 4 MG tablet Take 1 tablet (4 mg total) by mouth every 6 (six) hours as needed for nausea or vomiting. 30 tablet 11   clonazePAM (KLONOPIN) 1 MG tablet Take 1 tablet (1 mg total) by mouth 2 (two) times daily. 60 tablet 2   cyclobenzaprine (FLEXERIL) 10 MG tablet TAKE 1 TABLET BY MOUTH EVERY 8 HOURS AS NEEDED FOR 30 DAYS     dicyclomine (BENTYL) 10 MG capsule Take 1 capsule (10 mg total) by mouth 4 (four) times daily -  before meals and at bedtime for 7 days. 28 capsule 0   diphenhydrAMINE (BENADRYL) 25 mg capsule Take 25 mg by mouth every 6 (six) hours as needed for allergies.     fluticasone (FLONASE) 50 MCG/ACT nasal spray Place 2 sprays into both nostrils daily for 7 days. 15.8 mL 0   HYDROcodone-acetaminophen (NORCO) 5-325 MG tablet Take 1 tablet by mouth every 6 (six) hours as needed for moderate pain. 15 tablet 0   ibuprofen (ADVIL) 800 MG tablet 1 tablet with food or milk as needed     loperamide (IMODIUM) 2 MG capsule Take 1 capsule (2 mg total) by mouth as needed for diarrhea or loose stools. 30 capsule 0   Melatonin 10 MG CAPS Take 10 mg by mouth at bedtime as needed.      ondansetron (ZOFRAN) 4 MG tablet Take 1 tablet (4 mg total) by mouth every 6 (six) hours as needed for nausea. 20 tablet 0   prazosin (MINIPRESS) 2 MG capsule TAKE 1 CAPSULE BY MOUTH AT BEDTIME. 90 capsule 0   prazosin (MINIPRESS) 5 MG capsule  Take 1 capsule (5 mg total) by mouth at bedtime. 30 capsule 5   promethazine (PHENERGAN) 25 MG tablet Take 1 tablet (25 mg total) by mouth every 6 (six) hours as needed for nausea or vomiting. 30 tablet 11   rizatriptan (MAXALT-MLT) 10 MG disintegrating tablet Take 1 tablet (10 mg total) by mouth as needed. May repeat in 2 hours if needed 15 tablet 4   SUMAtriptan (IMITREX) 50 MG tablet Take 50 mg by mouth every 2 (two) hours as needed for migraine. May repeat in 2 hours if headache persists or recurs.     SUMAtriptan (IMITREX) 6 MG/0.5ML SOLN injection Inject 0.5 mLs (6 mg total) into the skin as needed for migraine. May repeat in  2 hours if headache persists or recurs. 10 vial 4    Pertinent medications related to GI and procedure were reviewed by me with the patient prior to the procedure   Current Facility-Administered Medications:    0.9 %  sodium chloride infusion, , Intravenous, Continuous, Annamaria Helling, DO, Last Rate: 20 mL/hr at 10/28/21 1111, New Bag at 10/28/21 1111      No Known Allergies Allergies were reviewed by me prior to the procedure  Objective    Vitals:   10/28/21 1056  BP: 139/85  Pulse: 81  Resp: 18  Temp: 97.7 F (36.5 C)  TempSrc: Temporal  SpO2: 99%  Weight: 90.7 kg  Height: 5\' 4"  (1.626 m)     Physical Exam Vitals and nursing note reviewed.  Constitutional:      General: She is not in acute distress.    Appearance: Normal appearance. She is not ill-appearing, toxic-appearing or diaphoretic.  HENT:     Head: Normocephalic and atraumatic.     Nose: Nose normal.     Mouth/Throat:     Mouth: Mucous membranes are moist.     Pharynx: Oropharynx is clear.  Eyes:     General: No scleral icterus.    Extraocular Movements: Extraocular movements intact.  Cardiovascular:     Rate and Rhythm: Normal rate and regular rhythm.     Heart sounds: Normal heart sounds. No murmur heard.   No friction rub. No gallop.  Pulmonary:     Effort:  Pulmonary effort is normal. No respiratory distress.     Breath sounds: Normal breath sounds. No wheezing, rhonchi or rales.  Abdominal:     General: Abdomen is flat. Bowel sounds are normal. There is no distension.     Palpations: Abdomen is soft.     Tenderness: There is no abdominal tenderness. There is no guarding or rebound.  Musculoskeletal:     Cervical back: Neck supple.     Right lower leg: No edema.     Left lower leg: No edema.  Skin:    General: Skin is warm and dry.     Coloration: Skin is not jaundiced or pale.  Neurological:     General: No focal deficit present.     Mental Status: She is alert and oriented to person, place, and time. Mental status is at baseline.  Psychiatric:        Mood and Affect: Mood normal.        Behavior: Behavior normal.        Thought Content: Thought content normal.        Judgment: Judgment normal.     Assessment:  Ms. Deanna George is a 55 y.o. female  who presents today for Esophagogastroduodenoscopy for  IBS, diarrhea, nausea, vomiting, weight loss.  Plan:  Esophagogastroduodenoscopy with possible intervention today  Esophagogastroduodenoscopy with possible biopsy, control of bleeding, polypectomy, and interventions as necessary has been discussed with the patient/patient representative. Informed consent was obtained from the patient/patient representative after explaining the indication, nature, and risks of the procedure including but not limited to death, bleeding, perforation, missed neoplasm/lesions, cardiorespiratory compromise, and reaction to medications. Opportunity for questions was given and appropriate answers were provided. Patient/patient representative has verbalized understanding is amenable to undergoing the procedure.   Annamaria Helling, DO  [facility]  Portions of the record may have been created with voice recognition software. Occasional wrong-word or 'sound-a-like' substitutions may  have occurred due to the inherent limitations of voice  recognition software.  Read the chart carefully and recognize, using context, where substitutions may have occurred.

## 2021-10-28 ENCOUNTER — Encounter: Payer: Self-pay | Admitting: Gastroenterology

## 2021-10-28 ENCOUNTER — Encounter
Admission: RE | Disposition: A | Discharge status: Home / Self Care | Payer: Self-pay | Source: Ambulatory Visit | Attending: Gastroenterology

## 2021-10-28 ENCOUNTER — Ambulatory Visit: Payer: Medicaid Other | Admitting: Anesthesiology

## 2021-10-28 ENCOUNTER — Ambulatory Visit
Admission: RE | Admit: 2021-10-28 | Discharge: 2021-10-28 | Disposition: A | Discharge status: Home / Self Care | Payer: Medicaid Other | Source: Ambulatory Visit | Attending: Gastroenterology | Admitting: Gastroenterology

## 2021-10-28 DIAGNOSIS — K319 Disease of stomach and duodenum, unspecified: Secondary | ICD-10-CM | POA: Diagnosis not present

## 2021-10-28 DIAGNOSIS — K449 Diaphragmatic hernia without obstruction or gangrene: Secondary | ICD-10-CM | POA: Diagnosis not present

## 2021-10-28 DIAGNOSIS — R112 Nausea with vomiting, unspecified: Secondary | ICD-10-CM | POA: Diagnosis present

## 2021-10-28 DIAGNOSIS — I129 Hypertensive chronic kidney disease with stage 1 through stage 4 chronic kidney disease, or unspecified chronic kidney disease: Secondary | ICD-10-CM | POA: Insufficient documentation

## 2021-10-28 DIAGNOSIS — N189 Chronic kidney disease, unspecified: Secondary | ICD-10-CM | POA: Diagnosis not present

## 2021-10-28 DIAGNOSIS — E1122 Type 2 diabetes mellitus with diabetic chronic kidney disease: Secondary | ICD-10-CM | POA: Diagnosis not present

## 2021-10-28 DIAGNOSIS — Z905 Acquired absence of kidney: Secondary | ICD-10-CM | POA: Diagnosis not present

## 2021-10-28 DIAGNOSIS — K317 Polyp of stomach and duodenum: Secondary | ICD-10-CM | POA: Diagnosis not present

## 2021-10-28 DIAGNOSIS — Z79899 Other long term (current) drug therapy: Secondary | ICD-10-CM | POA: Diagnosis not present

## 2021-10-28 DIAGNOSIS — Z87440 Personal history of urinary (tract) infections: Secondary | ICD-10-CM | POA: Diagnosis not present

## 2021-10-28 DIAGNOSIS — K589 Irritable bowel syndrome without diarrhea: Secondary | ICD-10-CM | POA: Diagnosis not present

## 2021-10-28 DIAGNOSIS — K219 Gastro-esophageal reflux disease without esophagitis: Secondary | ICD-10-CM | POA: Insufficient documentation

## 2021-10-28 HISTORY — PX: ESOPHAGOGASTRODUODENOSCOPY (EGD) WITH PROPOFOL: SHX5813

## 2021-10-28 HISTORY — DX: Chronic kidney disease, unspecified: N18.9

## 2021-10-28 HISTORY — DX: Type 2 diabetes mellitus without complications: E11.9

## 2021-10-28 LAB — GLUCOSE, CAPILLARY: Glucose-Capillary: 142 mg/dL — ABNORMAL HIGH (ref 70–99)

## 2021-10-28 SURGERY — ESOPHAGOGASTRODUODENOSCOPY (EGD) WITH PROPOFOL
Anesthesia: General

## 2021-10-28 MED ORDER — ONDANSETRON HCL 4 MG/2ML IJ SOLN
INTRAMUSCULAR | Status: DC | PRN
  Administered 2021-10-28: 4 mg via INTRAVENOUS

## 2021-10-28 MED ORDER — PROPOFOL 10 MG/ML IV BOLUS
INTRAVENOUS | Status: DC | PRN
  Administered 2021-10-28: 100 mg via INTRAVENOUS

## 2021-10-28 MED ORDER — SODIUM CHLORIDE 0.9 % IV SOLN: INTRAVENOUS | Status: DC

## 2021-10-28 MED ORDER — PROPOFOL 500 MG/50ML IV EMUL
INTRAVENOUS | Status: DC | PRN
  Administered 2021-10-28: 200 ug/kg/min via INTRAVENOUS

## 2021-10-28 NOTE — Transfer of Care (Signed)
Immediate Anesthesia Transfer of Care Note  Patient: Deanna George  Procedure(s) Performed: ESOPHAGOGASTRODUODENOSCOPY (EGD) WITH PROPOFOL  Patient Location: PACU  Anesthesia Type:General  Level of Consciousness: awake, alert  and oriented  Airway & Oxygen Therapy: Patient Spontanous Breathing and Patient connected to nasal cannula oxygen  Post-op Assessment: Report given to RN and Post -op Vital signs reviewed and stable  Post vital signs: Reviewed and stable  Last Vitals:  Vitals Value Taken Time  BP 110/63 10/28/21 1222  Temp 36.1 C 10/28/21 1222  Pulse 72 10/28/21 1225  Resp 12 10/28/21 1225  SpO2 98 % 10/28/21 1225  Vitals shown include unvalidated device data.  Last Pain:  Vitals:   10/28/21 1222  TempSrc: Temporal  PainSc: 0-No pain         Complications: No notable events documented.

## 2021-10-28 NOTE — Anesthesia Preprocedure Evaluation (Signed)
Anesthesia Evaluation  Patient identified by MRN, date of birth, ID band Patient awake    Reviewed: Allergy & Precautions, NPO status , Patient's Chart, lab work & pertinent test results  History of Anesthesia Complications (+) PONV  Airway Mallampati: II  TM Distance: >3 FB Neck ROM: full    Dental  (+) Teeth Intact   Pulmonary neg pulmonary ROS, Current Smoker,    Pulmonary exam normal  + decreased breath sounds      Cardiovascular Exercise Tolerance: Poor hypertension, Pt. on medications negative cardio ROS Normal cardiovascular exam+ dysrhythmias  Rhythm:Regular     Neuro/Psych  Headaches, negative neurological ROS  negative psych ROS   GI/Hepatic negative GI ROS, Neg liver ROS, GERD  ,  Endo/Other  negative endocrine ROSdiabetes, Well Controlled, Type 2  Renal/GU Renal diseasenegative Renal ROS  negative genitourinary   Musculoskeletal  (+) Arthritis ,   Abdominal Normal abdominal exam  (+)   Peds negative pediatric ROS (+)  Hematology negative hematology ROS (+)   Anesthesia Other Findings Past Medical History: No date: Anal fissure No date: Cervical disc disorder No date: Chronic kidney disease No date: Clostridium difficile infection No date: Diabetes mellitus without complication (HCC) No date: Dysrhythmia     Comment:  tachycardia No date: Esophageal stricture No date: GERD (gastroesophageal reflux disease) No date: Headache     Comment:  migraines No date: Hemorrhoids No date: Hypertension No date: IBS (irritable bowel syndrome) No date: Interstitial cystitis No date: Noncompliance     Comment:  pt denies No date: OA (osteoarthritis) of knee No date: Palpitations No date: Pneumonia No date: 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 No date: Situational stress No date: Urinary tract infection  Past Surgical History: 01/2017: BILATERAL OOPHORECTOMY     Comment:  Laparoscopic at [facility]  minimally invasive surgery               department 1995: BREAST EXCISIONAL BIOPSY; Right 1998,2000: CESAREAN SECTION     Comment:  x2 01/28/2013: CHOLECYSTECTOMY; N/A     Comment:  Procedure: LAPAROSCOPIC CHOLECYSTECTOMY WITH               INTRAOPERATIVE CHOLANGIOGRAM;  Surgeon: Earnstine Regal,               MD;  Location: [facility];  Service: General;  Laterality:               N/A; No date: COLONOSCOPY No date: CYSTOSTOMY W/ BLADDER DILATION No date: ESOPHAGUS SURGERY 09/2008: Foot pin post fracture     Comment:  Left foot x 2 2011: KNEE ARTHROSCOPY     Comment:  right 02/02/2015: LAPAROSCOPIC BILATERAL SALPINGECTOMY; Bilateral     Comment:  Procedure: BILATERAL SALPINGECTOMY, ;  Surgeon: Anastasio Auerbach, MD;  Location: [facility];  Service: Gynecology;               Laterality: Bilateral; No date: LAPAROSCOPIC NEPHRECTOMY; Right 01/2007: Laparoscopic surgery     Comment:  uterus and ovary x 2 No date: LAPAROSCOPIC VAGINAL HYSTERECTOMY     Comment:  02/2008 02/02/2015: LAPAROSCOPY; N/A     Comment:  Procedure: LAPAROSCOPY DIAGNOSTIC, FULGERATION               ENDOMETREOSIS, EXCISION RIGHT OVARIAN CYST, LYSIS OF               ADHESIONS, EXCISION VULVAR AND VAGINAL CYST ;  Surgeon:               Anastasio Auerbach, MD;  Location: [facility];  Service:               Gynecology;  Laterality: N/A; 2010: MOLE REMOVAL; Right 07/31/2008: US ECHOCARDIOGRAPHY     Comment:  EF 55-60% with mild LVH  BMI    Body Mass Index: 34.33 kg/m      Reproductive/Obstetrics negative OB ROS                             Anesthesia Physical Anesthesia Plan  ASA: 3  Anesthesia Plan: General   Post-op Pain Management:    Induction: Intravenous  PONV Risk Score and Plan: 1 and Ondansetron  Airway Management Planned: Natural Airway and Nasal Cannula  Additional Equipment:   Intra-op Plan:   Post-operative Plan:   Informed Consent: I have reviewed the  patients History and Physical, chart, labs and discussed the procedure including the risks, benefits and alternatives for the proposed anesthesia with the patient or authorized representative who has indicated his/her understanding and acceptance.     Dental Advisory Given  Plan Discussed with: CRNA and Surgeon  Anesthesia Plan Comments:         Anesthesia Quick Evaluation

## 2021-10-28 NOTE — Interval H&P Note (Signed)
History and Physical Interval Note: Preprocedure H&P from 10/28/21  was reviewed and there was no interval change after seeing and examining the patient.  Written consent was obtained from the patient after discussion of risks, benefits, and alternatives. Patient has consented to proceed with Esophagogastroduodenoscopy with possible intervention   10/28/2021 11:59 AM  [name]  has presented today for surgery, with the diagnosis of Weight loss R63.4 Abdominal pain, diffuse R10.84 Nausea R11.0.  The various methods of treatment have been discussed with the patient and family. After consideration of risks, benefits and other options for treatment, the patient has consented to  Procedure(s) with comments: ESOPHAGOGASTRODUODENOSCOPY (EGD) WITH PROPOFOL (N/A) - DM as a surgical intervention.  The patient's history has been reviewed, patient examined, no change in status, stable for surgery.  I have reviewed the patient's chart and labs.  Questions were answered to the patient's satisfaction.     Annamaria Helling

## 2021-10-28 NOTE — Anesthesia Postprocedure Evaluation (Signed)
Anesthesia Post Note  Patient: HONORE WIPPERFURTH  Procedure(s) Performed: ESOPHAGOGASTRODUODENOSCOPY (EGD) WITH PROPOFOL  Patient location during evaluation: PACU Anesthesia Type: General Level of consciousness: awake and awake and alert Pain management: pain level controlled Vital Signs Assessment: post-procedure vital signs reviewed and stable Respiratory status: spontaneous breathing and respiratory function stable Cardiovascular status: blood pressure returned to baseline Anesthetic complications: no   No notable events documented.   Last Vitals:  Vitals:   10/28/21 1222 10/28/21 1232  BP: 110/63 106/68  Pulse: 73 65  Resp: 15 10  Temp: (!) 36.1 C   SpO2: 96% 100%    Last Pain:  Vitals:   10/28/21 1232  TempSrc:   PainSc: 0-No pain                 VAN STAVEREN,Shizuo Biskup

## 2021-10-28 NOTE — Op Note (Signed)
[facility] Gastroenterology Patient Name: Deanna George Procedure Date: 10/28/2021 11:45 AM MRN: [id number] Account #: [id number] Date of Birth: [birth year] Admit Type: Outpatient Age: 55 Room: [facility] ENDO ROOM 1 Gender: Female Note Status: Finalized Instrument Name: Upper Endoscope [id number] Procedure:             Upper GI endoscopy Indications:           Nausea with vomiting Providers:             Annamaria Helling DO, DO Medicines:             Monitored Anesthesia Care Complications:         No immediate complications. Estimated blood loss:                         Minimal. Procedure:             Pre-Anesthesia Assessment:                        - Prior to the procedure, a History and Physical was                         performed, and patient medications and allergies were                         reviewed. The patient is competent. The risks and                         benefits of the procedure and the sedation options and                         risks were discussed with the patient. All questions                         were answered and informed consent was obtained.                         Patient identification and proposed procedure were                         verified by the physician, the nurse, the anesthetist                         and the technician in the endoscopy suite. Mental                         Status Examination: alert and oriented. Airway                         Examination: normal oropharyngeal airway and neck                         mobility. Respiratory Examination: clear to                         auscultation. CV Examination: RRR, no murmurs, no S3                         or S4. Prophylactic Antibiotics: The patient does  not                         require prophylactic antibiotics. Prior                         Anticoagulants: The patient has taken no previous                         anticoagulant or antiplatelet agents. ASA Grade                          Assessment: II - A patient with mild systemic disease.                         After reviewing the risks and benefits, the patient                         was deemed in satisfactory condition to undergo the                         procedure. The anesthesia plan was to use monitored                         anesthesia care (MAC). Immediately prior to                         administration of medications, the patient was                         re-assessed for adequacy to receive sedatives. The                         heart rate, respiratory rate, oxygen saturations,                         blood pressure, adequacy of pulmonary ventilation, and                         response to care were monitored throughout the                         procedure. The physical status of the patient was                         re-assessed after the procedure.                        After obtaining informed consent, the endoscope was                         passed under direct vision. Throughout the procedure,                         the patient's blood pressure, pulse, and oxygen                         saturations were monitored continuously. The Endoscope  was introduced through the mouth, and advanced to the                         second part of duodenum. The upper GI endoscopy was                         accomplished without difficulty. The patient tolerated                         the procedure well. Findings:      The duodenal bulb, first portion of the duodenum and second portion of       the duodenum were normal. Biopsies for histology were taken with a cold       forceps for evaluation of celiac disease. Estimated blood loss was       minimal.      Localized mild inflammation characterized by erythema was found in the       gastric antrum suspicious for bile gastritis. Biopsies were taken with a       cold forceps for Helicobacter pylori testing. Estimated  blood loss was       minimal.      Multiple 1 to 4 mm sessile polyps with no bleeding and no stigmata of       recent bleeding were found on the greater curvature of the stomach.       Biopsies were taken with a cold forceps for histology. Estimated blood       loss was minimal.      A small hiatal hernia was present. Estimated blood loss: none.      The Z-line was regular. Estimated blood loss: none.      Esophagogastric landmarks were identified: the gastroesophageal junction       was found at 38 cm from the incisors.      The exam was otherwise without abnormality. Impression:            - Normal duodenal bulb, first portion of the duodenum                         and second portion of the duodenum. Biopsied.                        - Mucosal changes suspicious for bile gastritis.                         Biopsied.                        - Multiple gastric polyps. Biopsied.                        - Small hiatal hernia.                        - Z-line regular.                        - Esophagogastric landmarks identified.                        - The examination was otherwise normal. Recommendation:        - Discharge patient to home.                        -  Resume previous diet.                        - Continue present medications.                        - plan to start bile acid sequestrants                        - Await pathology results.                        - Return to GI clinic as previously scheduled. Procedure Code(s):     --- Professional ---                        [phone], Esophagogastroduodenoscopy, flexible,                         transoral; with biopsy, single or multiple Diagnosis Code(s):     --- Professional ---                        K31.89, Other diseases of stomach and duodenum                        K31.7, Polyp of stomach and duodenum                        K44.9, Diaphragmatic hernia without obstruction or                         gangrene                         R11.2, Nausea with vomiting, unspecified CPT copyright 2019 [employer]. All rights reserved. The codes documented in this report are preliminary and upon coder review may  be revised to meet current compliance requirements. Attending Participation:      I personally performed the entire procedure. Volney American, DO Annamaria Helling DO, DO 10/28/2021 12:24:47 PM This report has been signed electronically. Number of Addenda: 0 Note Initiated On: 10/28/2021 11:45 AM Estimated Blood Loss:  Estimated blood loss was minimal.      [facility]

## 2021-10-31 ENCOUNTER — Encounter: Payer: Self-pay | Admitting: Gastroenterology

## 2021-10-31 LAB — SURGICAL PATHOLOGY

## 2021-11-02 ENCOUNTER — Ambulatory Visit: Payer: Self-pay | Admitting: Psychiatry

## 2021-11-15 ENCOUNTER — Other Ambulatory Visit: Payer: Self-pay

## 2021-11-15 ENCOUNTER — Ambulatory Visit (INDEPENDENT_AMBULATORY_CARE_PROVIDER_SITE_OTHER): Payer: Self-pay | Admitting: Psychiatry

## 2021-11-15 DIAGNOSIS — F331 Major depressive disorder, recurrent, moderate: Secondary | ICD-10-CM

## 2021-11-15 DIAGNOSIS — G4700 Insomnia, unspecified: Secondary | ICD-10-CM

## 2021-11-15 DIAGNOSIS — F5104 Psychophysiologic insomnia: Secondary | ICD-10-CM

## 2021-11-15 DIAGNOSIS — Z599 Problem related to housing and economic circumstances, unspecified: Secondary | ICD-10-CM

## 2021-11-15 DIAGNOSIS — F4312 Post-traumatic stress disorder, chronic: Secondary | ICD-10-CM

## 2021-11-15 DIAGNOSIS — M797 Fibromyalgia: Secondary | ICD-10-CM

## 2021-11-15 DIAGNOSIS — G9332 Myalgic encephalomyelitis/chronic fatigue syndrome: Secondary | ICD-10-CM

## 2021-11-15 DIAGNOSIS — Z62820 Parent-biological child conflict: Secondary | ICD-10-CM

## 2021-11-15 DIAGNOSIS — F909 Attention-deficit hyperactivity disorder, unspecified type: Secondary | ICD-10-CM

## 2021-11-15 DIAGNOSIS — F4001 Agoraphobia with panic disorder: Secondary | ICD-10-CM
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
## 2021-12-07 ENCOUNTER — Ambulatory Visit: Payer: Self-pay | Admitting: Psychiatry

## 2021-12-07 ENCOUNTER — Telehealth: Payer: Self-pay

## 2021-12-07 NOTE — Telephone Encounter (Signed)
Pt called today very panicky, she reports she feels like she is suffocating, she can't breath, she can't eat, she can't sleep. She reports only sleeping 2.5 hours in the last 3 days, she can't slow her thoughts down. Her conversation went from one subject to the next. She denied being suicidal many times, but feels if she didn't wake up it wouldn't matter. Very hopeless, she has no money, she has no gas in her car, she has no support or anyone to help her. She has medicaid now which is not taken at our practice. She mentions ways she could die but she said she would be too scared to ever do it. She has 2 children in college right now and it sounded like they don't talk to her much. She does need to get into someone for counseling, she was/is seeing Dr. [name] and was asking if he could call her later because he always can help her talk to through things.  She has not seen Gina since 05/2021 so would need an apt for any changes. Will suggest her county mental health. Will discuss with her to give her some suggestions. I asked if she would be assessed for inpatient mental health but she has a diabetic cat and 3 birds and no one to care for them.  Updated Rollene Fare and she recommends her county mental health as well.

## 2021-12-07 NOTE — Telephone Encounter (Signed)
Noted  

## 2021-12-29 ENCOUNTER — Ambulatory Visit: Payer: Self-pay | Admitting: Psychiatry

## 2022-01-24 ENCOUNTER — Ambulatory Visit: Payer: Medicaid Other | Admitting: Psychiatry

## 2022-01-29 ENCOUNTER — Other Ambulatory Visit: Payer: Self-pay | Admitting: Adult Health

## 2022-02-13 ENCOUNTER — Other Ambulatory Visit: Payer: Self-pay | Admitting: Adult Health

## 2022-02-13 DIAGNOSIS — G4700 Insomnia, unspecified: Secondary | ICD-10-CM

## 2022-02-13 DIAGNOSIS — F4001 Agoraphobia with panic disorder: Secondary | ICD-10-CM

## 2022-02-13 DIAGNOSIS — F411 Generalized anxiety disorder: Secondary | ICD-10-CM

## 2022-02-20 NOTE — Telephone Encounter (Signed)
Can you give patient a call to verify medications?

## 2022-03-15 ENCOUNTER — Ambulatory Visit: Payer: Medicaid Other | Admitting: Psychiatry

## 2022-03-15 ENCOUNTER — Ambulatory Visit (INDEPENDENT_AMBULATORY_CARE_PROVIDER_SITE_OTHER): Payer: Medicaid Other | Admitting: Adult Health

## 2022-03-15 DIAGNOSIS — F909 Attention-deficit hyperactivity disorder, unspecified type: Secondary | ICD-10-CM

## 2022-03-15 DIAGNOSIS — F331 Major depressive disorder, recurrent, moderate: Secondary | ICD-10-CM

## 2022-03-15 DIAGNOSIS — F5104 Psychophysiologic insomnia: Secondary | ICD-10-CM

## 2022-03-15 DIAGNOSIS — G4700 Insomnia, unspecified: Secondary | ICD-10-CM

## 2022-03-15 DIAGNOSIS — F4310 Post-traumatic stress disorder, unspecified: Secondary | ICD-10-CM

## 2022-03-15 DIAGNOSIS — Z62820 Parent-biological child conflict: Secondary | ICD-10-CM

## 2022-03-15 DIAGNOSIS — G8929 Other chronic pain: Secondary | ICD-10-CM

## 2022-03-15 DIAGNOSIS — F411 Generalized anxiety disorder: Secondary | ICD-10-CM

## 2022-03-15 DIAGNOSIS — F4312 Post-traumatic stress disorder, chronic: Secondary | ICD-10-CM

## 2022-03-15 DIAGNOSIS — Z599 Problem related to housing and economic circumstances, unspecified: Secondary | ICD-10-CM

## 2022-03-15 DIAGNOSIS — F4001 Agoraphobia with panic disorder: Secondary | ICD-10-CM

## 2022-03-15 DIAGNOSIS — M797 Fibromyalgia: Secondary | ICD-10-CM

## 2022-03-15 MED ORDER — CLONAZEPAM 1 MG PO TBDP: ORAL_TABLET | ORAL | 2 refills | Status: DC
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
## 2022-03-15 NOTE — Progress Notes (Signed)
Deanna George ?[id number] ?1966-07-17 ?56 y.o. ? ?Subjective:  ? ?Patient ID:  Deanna George is a 56 y.o. (DOB [birth year]) female. ? ?Chief Complaint: No chief complaint on file. ? ? ?HPI ?Deanna George presents to the office today for follow-up of PTSD and panic disorder. ? ?Describes mood today as "not so good". Pleasant. Tearful at times. Mood symptoms - reports depression, irritability and anxiety. Reports panic attacks. Reports mood instability. Stating "I'm having a difficult time". Feels like medications continue to be helpful, although she is having difficulties taking them current GI issues. Suggesting possible liquid or dissolvable formulas if available. Suffering from chronic pain issues and multiple health issues. Continues to work with Dr. Rica Mote. Varying interest and motivation. Taking medications as prescribed.  ?Energy levels low. Active, does not have a regular exercise routine.   ?Unable to enjoy usual interests and activities. Single. Lives alone with cat and birds.Marland Kitchen  ?Appetite adequate. Weight loss 40 pounds - unable to eat. ?Sleeps better some nights than others. Sleep varies day to day. ?Focus and concentration difficulties - ADHD. Completing some tasks. Managing some aspects of household.  ?Denies SI or HI.  ?Denies AH or VH. ? ?Previous medications: Ritalin and Thorazine - others, Trazadone ? ? ?Flowsheet Row Admission (Discharged) from 10/28/2021 in [place] ED from 05/17/2021 in [place]  ?C-SSRS RISK CATEGORY No Risk No Risk  ? ?  ?  ? ?Review of Systems:  ?Review of Systems  ?Musculoskeletal:  Negative for gait problem.  ?Neurological:  Negative for tremors.  ?Psychiatric/Behavioral:    ?     Please refer to HPI  ? ?Medications: I have reviewed the patient's current medications. ? ?Current Outpatient Medications  ?Medication Sig Dispense Refill  ? clonazePAM (KLONOPIN) 1 MG tablet TAKE 1 TABLET BY MOUTH TWICE A DAY  60 tablet 0  ? cyclobenzaprine (FLEXERIL) 10 MG tablet TAKE 1 TABLET BY MOUTH EVERY 8 HOURS AS NEEDED FOR 30 DAYS    ? dicyclomine (BENTYL) 10 MG capsule Take 1 capsule (10 mg total) by mouth 4 (four) times daily -  before meals and at bedtime for 7 days. 28 capsule 0  ? diphenhydrAMINE (BENADRYL) 25 mg capsule Take 25 mg by mouth every 6 (six) hours as needed for allergies.    ? DULoxetine (CYMBALTA) 60 MG capsule TAKE 2 CAPSULES BY MOUTH DAILY 60 capsule 2  ? empagliflozin (JARDIANCE) 25 MG TABS tablet Take 25 mg by mouth daily.    ? fluticasone (FLONASE) 50 MCG/ACT nasal spray Place 2 sprays into both nostrils daily for 7 days. 15.8 mL 0  ? HYDROcodone-acetaminophen (NORCO) 5-325 MG tablet Take 1 tablet by mouth every 6 (six) hours as needed for moderate pain. 15 tablet 0  ? hyoscyamine (LEVSIN SL) 0.125 MG SL tablet Place 0.125 mg under the tongue every 6 (six) hours as needed for cramping.    ? ibuprofen (ADVIL) 800 MG tablet 1 tablet with food or milk as needed    ? loperamide (IMODIUM) 2 MG capsule Take 1 capsule (2 mg total) by mouth as needed for diarrhea or loose stools. 30 capsule 0  ? losartan (COZAAR) 50 MG tablet Take 1 tablet (50 mg total) by mouth in the morning and at bedtime. 60 tablet 11  ? Melatonin 10 MG CAPS Take 10 mg by mouth at bedtime as needed.     ? metoprolol tartrate (LOPRESSOR) 25 MG tablet TAKE 1 TABLET BY MOUTH TWICE  DAILY. 60 tablet 11  ? omeprazole (PRILOSEC) 40 MG capsule Take 1 capsule (40 mg total) by mouth in the morning and at bedtime. 60 capsule 0  ? ondansetron (ZOFRAN) 4 MG tablet Take 1 tablet (4 mg total) by mouth every 6 (six) hours as needed for nausea or vomiting. 30 tablet 11  ? ondansetron (ZOFRAN) 4 MG tablet Take 1 tablet (4 mg total) by mouth every 6 (six) hours as needed for nausea. 20 tablet 0  ? prazosin (MINIPRESS) 2 MG capsule TAKE 1 CAPSULE BY MOUTH EVERYDAY AT BEDTIME 90 capsule 0  ? prazosin (MINIPRESS) 5 MG capsule Take 1 capsule (5 mg total) by mouth at  bedtime. 30 capsule 5  ? promethazine (PHENERGAN) 25 MG tablet Take 1 tablet (25 mg total) by mouth every 6 (six) hours as needed for nausea or vomiting. 30 tablet 11  ? rizatriptan (MAXALT-MLT) 10 MG disintegrating tablet Take 1 tablet (10 mg total) by mouth as needed. May repeat in 2 hours if needed 15 tablet 4  ? SUMAtriptan (IMITREX) 50 MG tablet Take 50 mg by mouth every 2 (two) hours as needed for migraine. May repeat in 2 hours if headache persists or recurs.    ? SUMAtriptan (IMITREX) 6 MG/0.5ML SOLN injection Inject 0.5 mLs (6 mg total) into the skin as needed for migraine. May repeat in 2 hours if headache persists or recurs. 10 vial 4  ? ?No current facility-administered medications for this visit.  ? ? ?Medication Side Effects: None ? ?Allergies: No Known Allergies ? ?Past Medical History:  ?Diagnosis Date  ? Anal fissure   ? Cervical disc disorder   ? Chronic kidney disease   ? Clostridium difficile infection   ? Diabetes mellitus without complication ([place])   ? Dysrhythmia   ? tachycardia  ? Esophageal stricture   ? GERD (gastroesophageal reflux disease)   ? Headache   ? migraines  ? Hemorrhoids   ? Hypertension   ? IBS (irritable bowel syndrome)   ? Interstitial cystitis   ? Noncompliance   ? pt denies  ? OA (osteoarthritis) of knee   ? Palpitations   ? Pneumonia   ? 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 Situational stress   ? Urinary tract infection   ? ? ?Past Medical History, Surgical history, Social history, and Family history were reviewed and updated as appropriate.  ? ?Please see review of systems for further details on the patient's review from today.  ? ?Objective:  ? ?Physical Exam:  ?There were no vitals taken for this visit. ? ?Physical Exam ?Constitutional:   ?   General: She is not in acute distress. ?Musculoskeletal:     ?   General: No deformity.  ?Neurological:  ?   Mental Status: She is alert and oriented to person, place, and time.  ?   Coordination: Coordination normal.   ?Psychiatric:     ?   Attention and Perception: Attention and perception normal. She does not perceive auditory or visual hallucinations.     ?   Mood and Affect: Mood normal. Mood is not anxious or depressed. Affect is not labile, blunt, angry or inappropriate.     ?   Speech: Speech normal.     ?   Behavior: Behavior normal.     ?   Thought Content: Thought content normal. Thought content is not paranoid or delusional. Thought content does not include homicidal or suicidal ideation. Thought content does not include homicidal  or suicidal plan.     ?   Cognition and Memory: Cognition and memory normal.     ?   Judgment: Judgment normal.  ?   Comments: Insight intact  ? ? ?Lab Review:  ?   ?Component Value Date/Time  ? NA 135 05/16/2021 2351  ? NA 140 03/27/2017 1543  ? K 4.2 05/16/2021 2351  ? CL 105 05/16/2021 2351  ? CO2 20 (L) 05/16/2021 2351  ? GLUCOSE 134 (H) 05/16/2021 2351  ? BUN 18 05/16/2021 2351  ? BUN 6 03/27/2017 1543  ? CREATININE 1.06 (H) 05/16/2021 2351  ? CREATININE 0.95 09/28/2017 1847  ? CALCIUM 9.4 05/16/2021 2351  ? PROT 7.9 05/17/2021 0314  ? PROT 7.2 03/27/2017 1543  ? ALBUMIN 4.3 05/17/2021 0314  ? ALBUMIN 4.5 03/27/2017 1543  ? AST 16 05/17/2021 0314  ? ALT 12 05/17/2021 0314  ? ALKPHOS 83 05/17/2021 0314  ? BILITOT 0.6 05/17/2021 0314  ? BILITOT 0.2 03/27/2017 1543  ? GFRNONAA >60 05/16/2021 2351  ? GFRNONAA 70 09/28/2017 1847  ? GFRAA 82 09/28/2017 1847  ? ? ?   ?Component Value Date/Time  ? WBC 11.3 (H) 05/16/2021 2351  ? RBC 4.30 05/16/2021 2351  ? HGB 11.8 (L) 05/16/2021 2351  ? HGB 12.5 03/27/2017 1543  ? HCT 35.1 (L) 05/16/2021 2351  ? HCT 39.0 03/27/2017 1543  ? PLT 209 05/16/2021 2351  ? PLT 248 03/27/2017 1543  ? MCV 81.6 05/16/2021 2351  ? MCV 87 03/27/2017 1543  ? MCH 27.4 05/16/2021 2351  ? MCHC 33.6 05/16/2021 2351  ? RDW 14.8 05/16/2021 2351  ? RDW 13.6 03/27/2017 1543  ? LYMPHSABS 1.7 10/03/2020 0511  ? LYMPHSABS 1.7 03/27/2017 1543  ? MONOABS 0.3 10/03/2020 0511  ? EOSABS  0.2 10/03/2020 0511  ? EOSABS 0.1 03/27/2017 1543  ? BASOSABS 0.0 10/03/2020 0511  ? BASOSABS 0.0 03/27/2017 1543  ? ? ?No results found for: POCLITH, LITHIUM  ? ?No results found for: PHENYTOIN, PHENOBAR

## 2022-03-16 ENCOUNTER — Encounter: Payer: Self-pay | Admitting: Adult Health

## 2022-03-17 ENCOUNTER — Telehealth: Payer: Self-pay

## 2022-03-17 NOTE — Telephone Encounter (Signed)
Ty!

## 2022-03-17 NOTE — Telephone Encounter (Signed)
Prior Authorization submitted and APPROVED for CLONAZEPAM 1 MG ODT #136 for 34 days effective 03/16/2022-03/17/2023 with [employer] ?

## 2022-04-15 ENCOUNTER — Other Ambulatory Visit: Payer: Self-pay | Admitting: Nurse Practitioner

## 2022-04-15 DIAGNOSIS — R1033 Periumbilical pain: Secondary | ICD-10-CM

## 2022-04-20 ENCOUNTER — Other Ambulatory Visit: Payer: Self-pay | Admitting: Nurse Practitioner

## 2022-04-20 DIAGNOSIS — R1033 Periumbilical pain: Secondary | ICD-10-CM

## 2022-04-20 DIAGNOSIS — R634 Abnormal weight loss: Secondary | ICD-10-CM

## 2022-04-20 DIAGNOSIS — R112 Nausea with vomiting, unspecified: Secondary | ICD-10-CM

## 2022-04-25 ENCOUNTER — Ambulatory Visit: Payer: Medicaid Other

## 2022-04-26 ENCOUNTER — Ambulatory Visit
Admission: RE | Admit: 2022-04-26 | Discharge: 2022-04-26 | Disposition: A | Discharge status: Home / Self Care | Payer: Medicaid Other | Source: Ambulatory Visit | Attending: Nurse Practitioner | Admitting: Nurse Practitioner

## 2022-04-26 DIAGNOSIS — R634 Abnormal weight loss: Secondary | ICD-10-CM | POA: Insufficient documentation

## 2022-04-26 DIAGNOSIS — R1033 Periumbilical pain: Secondary | ICD-10-CM | POA: Insufficient documentation

## 2022-04-26 DIAGNOSIS — R112 Nausea with vomiting, unspecified: Secondary | ICD-10-CM | POA: Insufficient documentation

## 2022-05-05 ENCOUNTER — Other Ambulatory Visit: Payer: Self-pay | Admitting: Nurse Practitioner

## 2022-05-05 DIAGNOSIS — R634 Abnormal weight loss: Secondary | ICD-10-CM

## 2022-05-05 DIAGNOSIS — R1033 Periumbilical pain: Secondary | ICD-10-CM

## 2022-05-05 DIAGNOSIS — R197 Diarrhea, unspecified: Secondary | ICD-10-CM

## 2022-05-05 DIAGNOSIS — R112 Nausea with vomiting, unspecified: Secondary | ICD-10-CM

## 2022-05-26 ENCOUNTER — Other Ambulatory Visit: Payer: Self-pay | Admitting: Adult Health

## 2022-05-26 DIAGNOSIS — G4700 Insomnia, unspecified: Secondary | ICD-10-CM

## 2022-05-26 DIAGNOSIS — F4310 Post-traumatic stress disorder, unspecified: Secondary | ICD-10-CM

## 2022-06-20 DIAGNOSIS — Z905 Acquired absence of kidney: Secondary | ICD-10-CM | POA: Insufficient documentation

## 2022-06-22 ENCOUNTER — Ambulatory Visit
Admission: RE | Admit: 2022-06-22 | Discharge: 2022-06-22 | Disposition: A | Discharge status: Home / Self Care | Payer: Medicaid Other | Source: Ambulatory Visit | Attending: Nurse Practitioner | Admitting: Nurse Practitioner

## 2022-06-22 DIAGNOSIS — R1033 Periumbilical pain: Secondary | ICD-10-CM | POA: Insufficient documentation

## 2022-06-22 DIAGNOSIS — R634 Abnormal weight loss: Secondary | ICD-10-CM | POA: Insufficient documentation

## 2022-06-22 DIAGNOSIS — R197 Diarrhea, unspecified: Secondary | ICD-10-CM | POA: Insufficient documentation

## 2022-06-22 DIAGNOSIS — R112 Nausea with vomiting, unspecified: Secondary | ICD-10-CM | POA: Insufficient documentation

## 2022-06-28 ENCOUNTER — Other Ambulatory Visit: Payer: Self-pay | Admitting: Nurse Practitioner

## 2022-06-28 DIAGNOSIS — R197 Diarrhea, unspecified: Secondary | ICD-10-CM

## 2022-06-28 DIAGNOSIS — R1084 Generalized abdominal pain: Secondary | ICD-10-CM

## 2022-06-28 DIAGNOSIS — R634 Abnormal weight loss: Secondary | ICD-10-CM

## 2022-07-19 ENCOUNTER — Ambulatory Visit
Admission: RE | Admit: 2022-07-19 | Discharge: 2022-07-19 | Disposition: A | Discharge status: Home / Self Care | Payer: Medicaid Other | Source: Ambulatory Visit | Attending: Nurse Practitioner | Admitting: Nurse Practitioner

## 2022-07-19 DIAGNOSIS — R197 Diarrhea, unspecified: Secondary | ICD-10-CM | POA: Insufficient documentation

## 2022-07-19 DIAGNOSIS — R634 Abnormal weight loss: Secondary | ICD-10-CM | POA: Insufficient documentation

## 2022-07-19 DIAGNOSIS — R1084 Generalized abdominal pain: Secondary | ICD-10-CM | POA: Insufficient documentation

## 2022-09-08 ENCOUNTER — Other Ambulatory Visit: Payer: Self-pay

## 2022-09-08 ENCOUNTER — Telehealth: Payer: Self-pay | Admitting: Adult Health

## 2022-09-08 DIAGNOSIS — F411 Generalized anxiety disorder: Secondary | ICD-10-CM

## 2022-09-08 MED ORDER — CLONAZEPAM 1 MG PO TBDP: ORAL_TABLET | ORAL | 0 refills | Status: DC

## 2022-09-08 NOTE — Telephone Encounter (Signed)
Pharmacy called and requested a new script be sent in for the klonopin. The script will expire before they will have the medication in stock

## 2022-09-08 NOTE — Telephone Encounter (Signed)
Pt called requesting the Clonazepam Rx send to the stand alone [facility].

## 2022-09-08 NOTE — Telephone Encounter (Signed)
Pended please schedule appt

## 2022-09-18 NOTE — Telephone Encounter (Signed)
Called and left voice mail to schedule appt

## 2022-09-21 ENCOUNTER — Other Ambulatory Visit: Payer: Self-pay | Admitting: Adult Health

## 2022-09-21 DIAGNOSIS — G4700 Insomnia, unspecified: Secondary | ICD-10-CM

## 2022-09-21 DIAGNOSIS — F4310 Post-traumatic stress disorder, unspecified: Secondary | ICD-10-CM

## 2022-09-22 NOTE — Telephone Encounter (Signed)
Please call patient to schedule an appt.-

## 2022-10-13 ENCOUNTER — Other Ambulatory Visit: Payer: Self-pay | Admitting: Adult Health

## 2022-10-13 NOTE — Telephone Encounter (Signed)
Filled 9/6 appt 12/7

## 2022-10-13 NOTE — Telephone Encounter (Signed)
Scheduled f/u 12/7

## 2022-10-19 ENCOUNTER — Ambulatory Visit: Payer: Medicaid Other | Admitting: Adult Health

## 2022-10-19 ENCOUNTER — Encounter: Payer: Self-pay | Admitting: Adult Health

## 2022-10-19 DIAGNOSIS — F411 Generalized anxiety disorder: Secondary | ICD-10-CM

## 2022-10-19 DIAGNOSIS — F4310 Post-traumatic stress disorder, unspecified: Secondary | ICD-10-CM | POA: Diagnosis not present

## 2022-10-19 MED ORDER — CLONAZEPAM 1 MG PO TBDP: ORAL_TABLET | ORAL | 2 refills | Status: DC

## 2022-10-19 MED ORDER — PRAZOSIN HCL 5 MG PO CAPS: 5.0000 mg | ORAL_CAPSULE | Freq: Every day | ORAL | 5 refills | Status: DC

## 2022-10-19 MED ORDER — DULOXETINE HCL 60 MG PO CPEP: 120.0000 mg | ORAL_CAPSULE | Freq: Every day | ORAL | 2 refills | Status: DC

## 2022-10-19 MED ORDER — DULOXETINE HCL 30 MG PO CPEP: ORAL_CAPSULE | ORAL | 0 refills | Status: DC

## 2022-10-19 NOTE — Progress Notes (Signed)
Deanna George [id number] 1966/02/26 56 y.o.  Subjective:   Patient ID:  Deanna George is a 56 y.o. (DOB [birth year]) female.  Chief Complaint: No chief complaint on file.   HPI Deanna George presents to the office today for follow-up of  PTSD and panic disorder.  Describes mood today as "not so good". Pleasant. Tearful at times. Mood symptoms - reports depression, irritability and anxiety - "hard to control emotions". Stating "I have days that are good - moments that are good". Reports worry, rumination, and over thinking. Reports panic attacks - using Clonazepam as needed. Has periods of getting - worked up". Reports mood is variable. Reports increased situational stressors. Financial struggles - difficulties making ends met". Has 2 pets that are dying - "triggering depression". Struggles to complete daily tasks. Suffering from chronic pain issues and multiple health issues. Reports excruciating pain - fibromyalgia - injuries. Increased health issues - throwing up - trying to keep weight on.  Feels like medications continue to be helpful. Continues to work with Dr. Rica Mote. Varying interest and motivation. Reports journaling and meditating daily. Taking medications as prescribed.  Energy levels lower. Active, does not have a regular exercise routine with physical limitations.   Unable to enjoy usual interests and activities. Single. Lives alone with cats. Has 2 children. Daughter graduating from [employer] and possibly leaving the country. Son at [employer]. Appetite adequate. Weight loss. Sleeps better some nights than others. Sleep varies day to day. Focus and concentration difficulties - ADHD. Completing some tasks. Managing some aspects of household. Disabled. Denies SI or HI.  Denies AH or VH. Denies self harm. Denies substance use.  Previous medications: Ritalin and Thorazine - others, Trazadone   Flowsheet Row Admission (Discharged) from 10/28/2021 in [facility] from 05/17/2021 in [facility] No Risk No Risk        Review of Systems:  Review of Systems  Musculoskeletal:  Negative for gait problem.  Neurological:  Negative for tremors.  Psychiatric/Behavioral:         Please refer to HPI    Medications: I have reviewed the patient's current medications.  Current Outpatient Medications  Medication Sig Dispense Refill   DULoxetine (CYMBALTA) 30 MG capsule Take one capsule daily - tapering up to the 62m daily. 30 capsule 0   clonazePAM (KLONOPIN) 1 MG disintegrating tablet Take one tablet four times daily as needed for anxiety and panic attacks. 120 tablet 2   cyclobenzaprine (FLEXERIL) 10 MG tablet TAKE 1 TABLET BY MOUTH EVERY 8 HOURS AS NEEDED FOR 30 DAYS     dicyclomine (BENTYL) 10 MG capsule Take 1 capsule (10 mg total) by mouth 4 (four) times daily -  before meals and at bedtime for 7 days. 28 capsule 0   diphenhydrAMINE (BENADRYL) 25 mg capsule Take 25 mg by mouth every 6 (six) hours as needed for allergies.     DULoxetine (CYMBALTA) 60 MG capsule Take 2 capsules (120 mg total) by mouth daily. 60 capsule 2   empagliflozin (JARDIANCE) 25 MG TABS tablet Take 25 mg by mouth daily.     fluticasone (FLONASE) 50 MCG/ACT nasal spray Place 2 sprays into both nostrils daily for 7 days. 15.8 mL 0   HYDROcodone-acetaminophen (NORCO) 5-325 MG tablet Take 1 tablet by mouth every 6 (six) hours as needed for moderate pain. 15 tablet 0   hyoscyamine (LEVSIN SL) 0.125 MG SL tablet Place 0.125 mg  under the tongue every 6 (six) hours as needed for cramping.     ibuprofen (ADVIL) 800 MG tablet 1 tablet with food or milk as needed     loperamide (IMODIUM) 2 MG capsule Take 1 capsule (2 mg total) by mouth as needed for diarrhea or loose stools. 30 capsule 0   losartan (COZAAR) 50 MG tablet Take 1 tablet (50 mg total) by mouth in the morning and at bedtime. 60 tablet 11   Melatonin 10 MG  CAPS Take 10 mg by mouth at bedtime as needed.      metoprolol tartrate (LOPRESSOR) 25 MG tablet TAKE 1 TABLET BY MOUTH TWICE DAILY. 60 tablet 11   omeprazole (PRILOSEC) 40 MG capsule Take 1 capsule (40 mg total) by mouth in the morning and at bedtime. 60 capsule 0   ondansetron (ZOFRAN) 4 MG tablet Take 1 tablet (4 mg total) by mouth every 6 (six) hours as needed for nausea or vomiting. 30 tablet 11   ondansetron (ZOFRAN) 4 MG tablet Take 1 tablet (4 mg total) by mouth every 6 (six) hours as needed for nausea. 20 tablet 0   prazosin (MINIPRESS) 2 MG capsule TAKE 1 CAPSULE BY MOUTH EVERYDAY AT BEDTIME 90 capsule 0   prazosin (MINIPRESS) 5 MG capsule Take 1 capsule (5 mg total) by mouth at bedtime. 30 capsule 5   promethazine (PHENERGAN) 25 MG tablet Take 1 tablet (25 mg total) by mouth every 6 (six) hours as needed for nausea or vomiting. 30 tablet 11   rizatriptan (MAXALT-MLT) 10 MG disintegrating tablet Take 1 tablet (10 mg total) by mouth as needed. May repeat in 2 hours if needed 15 tablet 4   SUMAtriptan (IMITREX) 50 MG tablet Take 50 mg by mouth every 2 (two) hours as needed for migraine. May repeat in 2 hours if headache persists or recurs.     SUMAtriptan (IMITREX) 6 MG/0.5ML SOLN injection Inject 0.5 mLs (6 mg total) into the skin as needed for migraine. May repeat in 2 hours if headache persists or recurs. 10 vial 4   No current facility-administered medications for this visit.    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   Please see review of systems for further details on the patient's review from today.   Objective:   Physical Exam:  There were no vitals taken for this visit.  Physical Exam Constitutional:      General: She is not in acute distress. Musculoskeletal:        General: No deformity.  Neurological:     Mental Status: She is alert and oriented to person, place, and time.  Coordination: Coordination normal.  Psychiatric:        Attention and Perception: Attention and perception normal. She does not perceive auditory or visual hallucinations.        Mood and Affect: Mood normal. Mood is not anxious or depressed. Affect is not labile, blunt, angry or inappropriate.        Speech: Speech normal.        Behavior: Behavior normal.        Thought Content: Thought content normal. Thought content is not paranoid or delusional. Thought content does not include homicidal or suicidal ideation. Thought content does not include homicidal or suicidal plan.        Cognition and Memory: Cognition and memory normal.        Judgment: Judgment normal.     Comments: Insight intact     Lab Review:     Component Value Date/Time   NA 135 05/16/2021 2351   NA 140 03/27/2017 1543   K 4.2 05/16/2021 2351   CL 105 05/16/2021 2351   CO2 20 (L) 05/16/2021 2351   GLUCOSE 134 (H) 05/16/2021 2351   BUN 18 05/16/2021 2351   BUN 6 03/27/2017 1543   CREATININE 1.06 (H) 05/16/2021 2351   CREATININE 0.95 09/28/2017 1847   CALCIUM 9.4 05/16/2021 2351   PROT 7.9 05/17/2021 0314   PROT 7.2 03/27/2017 1543   ALBUMIN 4.3 05/17/2021 0314   ALBUMIN 4.5 03/27/2017 1543   AST 16 05/17/2021 0314   ALT 12 05/17/2021 0314   ALKPHOS 83  05/17/2021 0314   BILITOT 0.6 05/17/2021 0314   BILITOT 0.2 03/27/2017 1543   GFRNONAA >60 05/16/2021 2351   GFRNONAA 70 09/28/2017 1847   GFRAA 82 09/28/2017 1847       Component Value Date/Time   WBC 11.3 (H) 05/16/2021 2351   RBC 4.30 05/16/2021 2351   HGB 11.8 (L) 05/16/2021 2351   HGB 12.5 03/27/2017 1543   HCT 35.1 (L) 05/16/2021 2351   HCT 39.0 03/27/2017 1543   PLT 209 05/16/2021 2351   PLT 248 03/27/2017 1543   MCV 81.6 05/16/2021 2351   MCV 87 03/27/2017 1543   MCH 27.4 05/16/2021 2351   MCHC 33.6 05/16/2021 2351   RDW 14.8 05/16/2021 2351   RDW 13.6 03/27/2017 1543   LYMPHSABS 1.7 10/03/2020 0511   LYMPHSABS 1.7 03/27/2017 1543   MONOABS 0.3 10/03/2020 0511   EOSABS 0.2 10/03/2020 0511   EOSABS 0.1 03/27/2017 1543   BASOSABS 0.0 10/03/2020 0511   BASOSABS 0.0 03/27/2017 1543    No results found for: "POCLITH", "LITHIUM"   No results found for: "PHENYTOIN", "PHENOBARB", "VALPROATE", "CBMZ"   .res Assessment: Plan:    Continue:  Cymbalta 133m daily  - restart with 333mcapsule - discussed taper up to previous dose of 12064mPrazosin 5mg19m hs - nightmaresn Clonazepam 1mg 71m to TID - ODT from tablet  Continue therapy with Andy [name] 6 months  Patient advised to contact office with any questions, adverse effects, or acute worsening in signs and symptoms.  Discussed potential benefits, risk, and side effects of benzodiazepines to include potential risk of tolerance and dependence, as well as possible drowsiness.  Advised patient not to drive if experiencing drowsiness and to take lowest possible effective dose to minimize risk of dependence and tolerance.  Discussed potential benefits, risks, and side effects of stimulants with patient to include increased heart rate, palpitations, insomnia, increased anxiety, increased irritability, or decreased appetite.  Instructed patient to  contact office if experiencing any significant tolerability  issues.  Diagnoses and all orders for this visit:  PTSD (post-traumatic stress disorder) -     DULoxetine (CYMBALTA) 60 MG capsule; Take 2 capsules (120 mg total) by mouth daily. -     prazosin (MINIPRESS) 5 MG capsule; Take 1 capsule (5 mg total) by mouth at bedtime.  Generalized anxiety disorder -     DULoxetine (CYMBALTA) 60 MG capsule; Take 2 capsules (120 mg total) by mouth daily. -     DULoxetine (CYMBALTA) 30 MG capsule; Take one capsule daily - tapering up to the 53m daily. -     clonazePAM (KLONOPIN) 1 MG disintegrating tablet; Take one tablet four times daily as needed for anxiety and panic attacks.     Please see After Visit Summary for patient specific instructions.  Future Appointments  Date Time Provider [place] 12/05/2022  4:00 PM MBlanchie Serve PhD CP-CP None  04/19/2023  1:20 PM , RBerdie Ogren NP CP-CP None    No orders of the defined types were placed in this encounter.   -------------------------------

## 2022-11-02 ENCOUNTER — Other Ambulatory Visit: Payer: Self-pay | Admitting: Adult Health

## 2022-11-02 DIAGNOSIS — F411 Generalized anxiety disorder: Secondary | ICD-10-CM

## 2022-11-02 DIAGNOSIS — F4310 Post-traumatic stress disorder, unspecified: Secondary | ICD-10-CM

## 2022-11-30 ENCOUNTER — Encounter (INDEPENDENT_AMBULATORY_CARE_PROVIDER_SITE_OTHER): Payer: Medicaid Other | Admitting: Vascular Surgery

## 2022-12-05 ENCOUNTER — Ambulatory Visit: Payer: Medicaid Other | Admitting: Psychiatry

## 2022-12-05 DIAGNOSIS — F5104 Psychophysiologic insomnia: Secondary | ICD-10-CM

## 2022-12-05 DIAGNOSIS — G8929 Other chronic pain: Secondary | ICD-10-CM

## 2022-12-05 DIAGNOSIS — F4312 Post-traumatic stress disorder, chronic: Secondary | ICD-10-CM

## 2022-12-05 DIAGNOSIS — F331 Major depressive disorder, recurrent, moderate: Secondary | ICD-10-CM

## 2022-12-05 DIAGNOSIS — Z599 Problem related to housing and economic circumstances, unspecified: Secondary | ICD-10-CM

## 2022-12-05 DIAGNOSIS — G9332 Myalgic encephalomyelitis/chronic fatigue syndrome: Secondary | ICD-10-CM

## 2022-12-05 DIAGNOSIS — Z62820 Parent-biological child conflict: Secondary | ICD-10-CM

## 2022-12-05 DIAGNOSIS — F411 Generalized anxiety disorder: Secondary | ICD-10-CM
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
## 2023-01-02 ENCOUNTER — Other Ambulatory Visit: Payer: Self-pay

## 2023-01-02 ENCOUNTER — Emergency Department: Payer: Medicaid Other

## 2023-01-02 ENCOUNTER — Emergency Department
Admission: EM | Admit: 2023-01-02 | Discharge: 2023-01-02 | Disposition: A | Discharge status: Home / Self Care | Payer: Medicaid Other | Attending: Emergency Medicine | Admitting: Emergency Medicine

## 2023-01-02 ENCOUNTER — Encounter (INDEPENDENT_AMBULATORY_CARE_PROVIDER_SITE_OTHER): Payer: Self-pay | Admitting: Vascular Surgery

## 2023-01-02 DIAGNOSIS — R0602 Shortness of breath: Secondary | ICD-10-CM

## 2023-01-02 DIAGNOSIS — U071 COVID-19: Secondary | ICD-10-CM | POA: Diagnosis not present

## 2023-01-02 DIAGNOSIS — I10 Essential (primary) hypertension: Secondary | ICD-10-CM | POA: Diagnosis not present

## 2023-01-02 LAB — RESP PANEL BY RT-PCR (RSV, FLU A&B, COVID)  RVPGX2
Influenza A by PCR: NEGATIVE
Influenza B by PCR: NEGATIVE
Resp Syncytial Virus by PCR: NEGATIVE
SARS Coronavirus 2 by RT PCR: POSITIVE — AB

## 2023-01-02 MED ORDER — PREDNISONE 10 MG (21) PO TBPK: ORAL_TABLET | ORAL | 0 refills | Status: AC

## 2023-01-02 MED ORDER — ALBUTEROL SULFATE HFA 108 (90 BASE) MCG/ACT IN AERS: 2.0000 | INHALATION_SPRAY | Freq: Four times a day (QID) | RESPIRATORY_TRACT | 2 refills | Status: AC | PRN

## 2023-01-02 NOTE — ED Provider Notes (Signed)
[facility] Provider Note   Event Date/Time   First MD Initiated Contact with Patient 01/02/23 1347     (approximate) History  Shortness of Breath  HPI TOMASITA TABIN is a 57 y.o. female with multiple chronic medical problems including interstitial cystitis, abdominal pain, endometriosis, hypertension, cervical radiculopathy, migraine, and chronic dysphagia to solid foods who presents with concerns for 11 days of shortness of breath.  Patient does endorse recent sick contact with coronavirus and states that she has been having generalized weakness, fatigue, and breathing problems since contact with this person.  Patient also endorses vomiting over this time as well however patient does have chronic vomiting. ROS: Patient currently denies any vision changes, tinnitus, difficulty speaking, facial droop, chest pain, shortness of breath, abdominal pain, diarrhea, dysuria, or weakness/numbness/paresthesias in any extremity   Physical Exam  Triage Vital Signs: ED Triage Vitals  Enc Vitals Group     BP 01/02/23 1300 (!) 165/101     Pulse Rate 01/02/23 1300 67     Resp 01/02/23 1300 20     Temp 01/02/23 1300 99 F (37.2 C)     Temp Source 01/02/23 1300 Oral     SpO2 01/02/23 1300 99 %     Weight --      Height --      Head Circumference --      Peak Flow --      Pain Score 01/02/23 1251 2     Pain Loc --      Pain Edu? --      Excl. in [place]? --    Most recent vital signs: Vitals:   01/02/23 1300  BP: (!) 165/101  Pulse: 67  Resp: 20  Temp: 99 F (37.2 C)  SpO2: 99%   General: Awake, oriented x4. CV:  Good peripheral perfusion.  Resp:  Normal effort.  Mild end expiratory wheezing worse on the right Abd:  No distention.  Other:  Middle-aged overweight Caucasian female laying in bed in no acute distress ED Results / Procedures / Treatments  Labs (all labs ordered are listed, but only abnormal results are displayed) Labs Reviewed  RESP PANEL BY RT-PCR  (RSV, FLU A&B, COVID)  [id number] - Abnormal; Notable for the following components:      Result Value   SARS Coronavirus 2 by RT PCR POSITIVE (*)    All other components within normal limits   RADIOLOGY ED MD interpretation: One-view chest x-ray interpreted by me shows no evidence of acute abnormalities including no pneumonia, pneumothorax, or widened mediastinum -Agree with radiology assessment Official radiology report(s): DG Chest Port 1 View  Result Date: 01/02/2023 CLINICAL DATA:  SOB EXAM: PORTABLE CHEST 1 VIEW COMPARISON:  05/16/21 CXR FINDINGS: The heart size and mediastinal contours are within normal limits. Both lungs are clear. The visualized skeletal structures are unremarkable.Surgical clips in the right upper quadrant. IMPRESSION: No focal airspace opacity Electronically Signed   By: Marin Roberts M.D.   On: 01/02/2023 14:45   PROCEDURES: Critical Care performed: No Procedures MEDICATIONS ORDERED IN ED: Medications - No data to display IMPRESSION / MDM / [place] / ED COURSE  I reviewed the triage vital signs and the nursing notes.                             The patient is on the cardiac monitor to evaluate for evidence of arrhythmia and/or significant heart rate changes.  Patient's presentation is most consistent with acute presentation with potential threat to life or bodily function. Presentation most consistent with Viral Syndrome.  Patient has tested positive for COVID-19. Based on vitals and exam they are nontoxic and stable for discharge.  Given History and Exam I have a lower suspicion for: Emergent CardioPulmonary causes [acute Heart Failure or exacerbation, PE, PTX, atypical ACS, PNA]. Emergent Otolaryngeal causes [such as PTA, RPA, Ludwigs, Epiglottitis, EBV].  Regarding Emergent Travel or Immunosuppressive related infectious: I have a low suspicion for acute HIV.  Will provide strict return precautions and instructions on self-isolation/quarantine and  anticipatory guidance.  Patient ambulatory without difficulty Rx: Albuterol, prednisone Dispo: Discharge home with PCP follow-up as needed   FINAL CLINICAL IMPRESSION(S) / ED DIAGNOSES   Final diagnoses:  COVID-19 virus infection  Shortness of breath   Rx / DC Orders   ED Discharge Orders          Ordered    albuterol (VENTOLIN HFA) 108 (90 Base) MCG/ACT inhaler  Every 6 hours PRN        01/02/23 1443    predniSONE (STERAPRED UNI-PAK 21 TAB) 10 MG (21) TBPK tablet        01/02/23 1443           Note:  This document was prepared using Dragon voice recognition software and may include unintentional dictation errors.   Naaman Plummer, MD 01/02/23 [phone]

## 2023-01-02 NOTE — ED Triage Notes (Signed)
Pt comes via EMS from home with c/o breathing problems that started 11 days ago. Family in home has covid. Pt has not tested herself.

## 2023-01-04 ENCOUNTER — Encounter (INDEPENDENT_AMBULATORY_CARE_PROVIDER_SITE_OTHER): Payer: Medicaid Other | Admitting: Vascular Surgery

## 2023-01-30 ENCOUNTER — Ambulatory Visit: Payer: Medicaid Other | Admitting: Psychiatry

## 2023-02-08 ENCOUNTER — Encounter (INDEPENDENT_AMBULATORY_CARE_PROVIDER_SITE_OTHER): Payer: Medicaid Other | Admitting: Vascular Surgery

## 2023-03-01 ENCOUNTER — Ambulatory Visit: Payer: Medicaid Other | Admitting: Psychiatry

## 2023-03-22 ENCOUNTER — Other Ambulatory Visit: Payer: Self-pay | Admitting: Nurse Practitioner

## 2023-03-22 DIAGNOSIS — R112 Nausea with vomiting, unspecified: Secondary | ICD-10-CM

## 2023-03-26 ENCOUNTER — Ambulatory Visit (INDEPENDENT_AMBULATORY_CARE_PROVIDER_SITE_OTHER): Payer: Medicaid Other | Admitting: Vascular Surgery

## 2023-03-26 ENCOUNTER — Encounter (INDEPENDENT_AMBULATORY_CARE_PROVIDER_SITE_OTHER): Payer: Self-pay | Admitting: Vascular Surgery

## 2023-03-26 VITALS — BP 145/88 | HR 71 | Resp 16 | Wt 169.6 lb

## 2023-03-26 DIAGNOSIS — K219 Gastro-esophageal reflux disease without esophagitis: Secondary | ICD-10-CM | POA: Diagnosis not present

## 2023-03-26 DIAGNOSIS — R1031 Right lower quadrant pain: Secondary | ICD-10-CM

## 2023-03-26 DIAGNOSIS — G8929 Other chronic pain: Secondary | ICD-10-CM | POA: Diagnosis not present

## 2023-03-26 DIAGNOSIS — I10 Essential (primary) hypertension: Secondary | ICD-10-CM | POA: Diagnosis not present

## 2023-04-01 ENCOUNTER — Encounter (INDEPENDENT_AMBULATORY_CARE_PROVIDER_SITE_OTHER): Payer: Self-pay | Admitting: Vascular Surgery

## 2023-04-01 DIAGNOSIS — G8929 Other chronic pain: Secondary | ICD-10-CM | POA: Insufficient documentation

## 2023-04-01 NOTE — Progress Notes (Signed)
MRN : [id number]  Deanna George is a 57 y.o. (1966-02-20) female who presents with chief complaint of check circulation.  History of Present Illness:   I am asked to evaluate the patient for the complaint of abdominal pain with uncertain etiology.  The patient is followed the medical service, who is concerned about mesenteric ischemia.  The patient has note noted weight loss as well as nausea.  The patient denies food fear, particular foods do not seem to aggravate or alleviate the symptoms.  The patient denies bloody bowel movements or diarrhea.  No history of peptic ulcer disease.   No prior peripheral angiograms or vascular interventions.  The patient denies amaurosis fugax or recent TIA symptoms. There are no recent neurological changes noted. The patient denies claudication symptoms or rest pain symptoms. The patient denies history of DVT, PE or superficial thrombophlebitis. The patient denies recent episodes of angina   I have reviewed several of the CT scans that she has already had both personally as well as with the patient.  There is no evidence to support arcuate ligament syndrome or mesenteric ischemia    Current Meds  Medication Sig   albuterol (VENTOLIN HFA) 108 (90 Base) MCG/ACT inhaler Inhale 2 puffs into the lungs every 6 (six) hours as needed for wheezing or shortness of breath.   CANNABIDIOL PO Take by mouth at bedtime.   clonazePAM (KLONOPIN) 1 MG disintegrating tablet Take one tablet four times daily as needed for anxiety and panic attacks.   cyclobenzaprine (FLEXERIL) 10 MG tablet TAKE 1 TABLET BY MOUTH EVERY 8 HOURS AS NEEDED FOR 30 DAYS   diphenhydrAMINE (BENADRYL) 25 mg capsule Take 25 mg by mouth every 6 (six) hours as needed for allergies.   DULoxetine (CYMBALTA) 30 MG capsule Take one capsule daily - tapering up to the 60mg  daily.   DULoxetine (CYMBALTA) 60 MG capsule Take 2 capsules (120  mg total) by mouth daily.   empagliflozin (JARDIANCE) 25 MG TABS tablet Take 25 mg by mouth daily.   hyoscyamine (LEVSIN SL) 0.125 MG SL tablet Place 0.125 mg under the tongue every 6 (six) hours as needed for cramping.   ibuprofen (ADVIL) 800 MG tablet 1 tablet with food or milk as needed   loperamide (IMODIUM) 2 MG capsule Take 1 capsule (2 mg total) by mouth as needed for diarrhea or loose stools.   losartan (COZAAR) 50 MG tablet Take 1 tablet (50 mg total) by mouth in the morning and at bedtime.   Melatonin 10 MG CAPS Take 10 mg by mouth at bedtime as needed.    omeprazole (PRILOSEC) 40 MG capsule Take 1 capsule (40 mg total) by mouth in the morning and at bedtime.   ondansetron (ZOFRAN) 4 MG tablet Take 1 tablet (4 mg total) by mouth every 6 (six) hours as needed for nausea or vomiting.   ondansetron (ZOFRAN) 4 MG tablet Take 1 tablet (4 mg total) by mouth every 6 (six) hours as needed for nausea.   prazosin (MINIPRESS) 2 MG capsule TAKE 1 CAPSULE BY MOUTH EVERYDAY AT BEDTIME   prazosin (MINIPRESS)  5 MG capsule Take 1 capsule (5 mg total) by mouth at bedtime.   predniSONE (STERAPRED UNI-PAK 21 TAB) 10 MG (21) TBPK tablet As directed on packaging   promethazine (PHENERGAN) 25 MG tablet Take 1 tablet (25 mg total) by mouth every 6 (six) hours as needed for nausea or vomiting.   propranolol (INDERAL) 20 MG tablet Take 1 tablet by mouth 2 (two) times daily.   rizatriptan (MAXALT-MLT) 10 MG disintegrating tablet Take 1 tablet (10 mg total) by mouth as needed. May repeat in 2 hours if needed   SUMAtriptan (IMITREX) 50 MG tablet Take 50 mg by mouth every 2 (two) hours as needed for migraine. May repeat in 2 hours if headache persists or recurs.   SUMAtriptan (IMITREX) 6 MG/0.5ML SOLN injection Inject 0.5 mLs (6 mg total) into the skin as needed for migraine. May repeat in 2 hours if headache persists or recurs.    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    Situational stress    Urinary tract infection     Past Surgical History:  Procedure Laterality Date   BILATERAL OOPHORECTOMY  01/2017   Laparoscopic at [facility] minimally invasive surgery department   BREAST EXCISIONAL BIOPSY Right 1995   CESAREAN SECTION  [phone]   x2   CHOLECYSTECTOMY N/A 01/28/2013   Procedure: LAPAROSCOPIC CHOLECYSTECTOMY WITH INTRAOPERATIVE CHOLANGIOGRAM;  Surgeon: Velora Heckler, MD;  Location: [facility] ORS;  Service: General;  Laterality: N/A;   COLONOSCOPY     CYSTOSTOMY W/ BLADDER DILATION     ESOPHAGOGASTRODUODENOSCOPY (EGD) WITH PROPOFOL N/A 10/28/2021   Procedure: ESOPHAGOGASTRODUODENOSCOPY (EGD) WITH PROPOFOL;  Surgeon: Jaynie Collins, DO;  Location: [facility] ENDOSCOPY;  Service: Gastroenterology;  Laterality: N/A;  DM   ESOPHAGUS SURGERY     Foot pin post fracture  09/2008   Left foot x 2   KNEE ARTHROSCOPY  2011   right   LAPAROSCOPIC BILATERAL SALPINGECTOMY Bilateral 02/02/2015   Procedure: BILATERAL SALPINGECTOMY, ;  Surgeon: Dara Lords, MD;  Location: [facility] ORS;  Service: Gynecology;  Laterality: Bilateral;   LAPAROSCOPIC NEPHRECTOMY Right    Laparoscopic surgery  01/2007   uterus and ovary x 2   LAPAROSCOPIC VAGINAL HYSTERECTOMY     02/2008   LAPAROSCOPY N/A 02/02/2015   Procedure: LAPAROSCOPY DIAGNOSTIC, FULGERATION ENDOMETREOSIS, EXCISION RIGHT OVARIAN CYST, LYSIS OF ADHESIONS, EXCISION VULVAR AND VAGINAL CYST ;  Surgeon: Dara Lords, MD;  Location: [facility] ORS;  Service: Gynecology;   Laterality: N/A;   MOLE REMOVAL Right 2010   US ECHOCARDIOGRAPHY  07/31/2008   EF 55-60% with mild LVH    Social History Social History   Tobacco Use   Smoking status: Some Days    Types: Cigarettes   Smokeless tobacco: Never  Vaping Use   Vaping Use: Never used  Substance Use Topics   Alcohol use: Not Currently  Comment: Rare   Drug use: Not Currently    Family History Family History  Adopted: Yes  Problem Relation Age of Onset   Colon cancer Neg Hx    Pancreatic cancer Neg Hx    Stomach cancer Neg Hx    Rectal cancer Neg Hx     No Known Allergies   REVIEW OF SYSTEMS (Negative unless checked)  Constitutional: [] Weight loss  [] Fever  [] Chills Cardiac: [] Chest pain   [] Chest pressure   [] Palpitations   [] Shortness of breath when laying flat   [] Shortness of breath with exertion. Vascular:  [x] Pain in legs with walking   [] Pain in legs at rest  [] History of DVT   [] Phlebitis   [] Swelling in legs   [] Varicose veins   [] Non-healing ulcers Pulmonary:   [] Uses home oxygen   [] Productive cough   [] Hemoptysis   [] Wheeze  [] COPD   [] Asthma Neurologic:  [] Dizziness   [] Seizures   [] History of stroke   [] History of TIA  [] Aphasia   [] Vissual changes   [] Weakness or numbness in arm   [] Weakness or numbness in leg Musculoskeletal:   [] Joint swelling   [] Joint pain   [] Low back pain Hematologic:  [] Easy bruising  [] Easy bleeding   [] Hypercoagulable state   [] Anemic Gastrointestinal:  [] Diarrhea   [] Vomiting  [] Gastroesophageal reflux/heartburn   [] Difficulty swallowing. Genitourinary:  [] Chronic kidney disease   [] Difficult urination  [] Frequent urination   [] Blood in urine Skin:  [] Rashes   [] Ulcers  Psychological:  [] History of anxiety   []  History of major depression.  Physical Examination  Vitals:   03/26/23 1436  BP: (!) 145/88  Pulse: 71  Resp: 16  Weight: 169 lb 9.6 oz (76.9 kg)   Body mass index is 29.11 kg/m. Gen: WD/WN, NAD Head: [place]/AT, No temporalis wasting.   Ear/Nose/Throat: Hearing grossly intact, nares w/o erythema or drainage Eyes: PER, EOMI, sclera nonicteric.  Neck: Supple, no masses.  No bruit or JVD.  Pulmonary:  Good air movement, no audible wheezing, no use of accessory muscles.  Cardiac: RRR, normal S1, S2, no Murmurs. Vascular:  mild trophic changes, no open wounds Vessel Right Left  Radial Palpable Palpable  Gastrointestinal: soft, non-distended. No guarding/no peritoneal signs.  Musculoskeletal: M/S 5/5 throughout.  No visible deformity.  Neurologic: CN 2-12 intact. Pain and light touch intact in extremities.  Symmetrical.  Speech is fluent. Motor exam as listed above. Psychiatric: Judgment intact, Mood & affect appropriate for pt's clinical situation. Dermatologic: No rashes or ulcers noted.  No changes consistent with cellulitis.   CBC Lab Results  Component Value Date   WBC 11.3 (H) 05/16/2021   HGB 11.8 (L) 05/16/2021   HCT 35.1 (L) 05/16/2021   MCV 81.6 05/16/2021   PLT 209 05/16/2021    BMET    Component Value Date/Time   NA 135 05/16/2021 2351   NA 140 03/27/2017 1543   K 4.2 05/16/2021 2351   CL 105 05/16/2021 2351   CO2 20 (L) 05/16/2021 2351   GLUCOSE 134 (H) 05/16/2021 2351   BUN 18 05/16/2021 2351   BUN 6 03/27/2017 1543   CREATININE 1.06 (H) 05/16/2021 2351   CREATININE 0.95 09/28/2017 1847   CALCIUM 9.4 05/16/2021 2351   GFRNONAA >60 05/16/2021 2351   GFRNONAA 70 09/28/2017 1847   GFRAA 82 09/28/2017 1847   CrCl cannot be calculated (Patient's most recent lab result is older than the maximum 21 days allowed.).  COAG Lab Results  Component Value Date   INR  1.01 01/24/2013    Radiology No results found.   Assessment/Plan 1. Abdominal pain, chronic, right lower quadrant Recommend:  The patient does not have any significant evidence for chronic mesenteric ischemia or arcuate ligament syndrome.  Given the lack of symptoms no intervention is warranted at this time.  I have reviewed several  of the CT scans that she has already had both personally as well as with the patient.  There is no evidence to support arcuate ligament syndrome or mesenteric ischemia  No further invasive studies, angiography or surgery at this time  The patient should continue walking and a more formal exercise program.   She will follow-up with me as needed  2. Benign essential hypertension Continue antihypertensive medications as already ordered, these medications have been reviewed and there are no changes at this time.  3. Gastroesophageal reflux disease, unspecified whether esophagitis present Continue PPI as already ordered, this medication has been reviewed and there are no changes at this time.  Avoidence of caffeine and alcohol  Moderate elevation of the head of the bed     Levora Dredge, MD  04/01/2023 3:55 PM

## 2023-04-19 ENCOUNTER — Encounter: Payer: Self-pay | Admitting: Adult Health

## 2023-04-19 ENCOUNTER — Ambulatory Visit (INDEPENDENT_AMBULATORY_CARE_PROVIDER_SITE_OTHER): Payer: Medicaid Other | Admitting: Adult Health

## 2023-04-19 DIAGNOSIS — F4310 Post-traumatic stress disorder, unspecified: Secondary | ICD-10-CM

## 2023-04-19 DIAGNOSIS — F411 Generalized anxiety disorder: Secondary | ICD-10-CM

## 2023-04-19 MED ORDER — DULOXETINE HCL 60 MG PO CPEP: 120.0000 mg | ORAL_CAPSULE | Freq: Every day | ORAL | 5 refills | Status: DC

## 2023-04-19 MED ORDER — PRAZOSIN HCL 5 MG PO CAPS: 5.0000 mg | ORAL_CAPSULE | Freq: Every day | ORAL | 5 refills | Status: DC

## 2023-04-19 MED ORDER — DULOXETINE HCL 30 MG PO CPEP: ORAL_CAPSULE | ORAL | 0 refills | Status: DC

## 2023-04-19 MED ORDER — CLONAZEPAM 1 MG PO TBDP: ORAL_TABLET | ORAL | 2 refills | Status: DC

## 2023-04-19 NOTE — Progress Notes (Signed)
[name] [id number] Jan 29, 1966 57 y.o.  Virtual Visit via Telephone Note  I connected with pt on 04/19/23 at  1:20 PM EDT by telephone and verified that I am speaking with the correct person using two identifiers.   I discussed the limitations, risks, security and privacy concerns of performing an evaluation and management service by telephone and the availability of in person appointments. I also discussed with the patient that there may be a patient responsible charge related to this service. The patient expressed understanding and agreed to proceed.   I discussed the assessment and treatment plan with the patient. The patient was provided an opportunity to ask questions and all were answered. The patient agreed with the plan and demonstrated an understanding of the instructions.   The patient was advised to call back or seek an in-person evaluation if the symptoms worsen or if the condition fails to improve as anticipated.  I provided 10 minutes of non-face-to-face time during this encounter.  The patient was located at home.  The provider was located at [facility].   Dorothyann Gibbs, NP   Subjective:   Patient ID:  Deanna George is a 57 y.o. (DOB 11-18-1965) female.  Chief Complaint: No chief complaint on file.   HPI Deanna George presents for follow-up of  PTSD and panic disorder.  Describes mood today as "not so good". Pleasant. Tearful at times. Mood symptoms - reports depression, irritability and anxiety. Reports worry, rumination, and over thinking. Reports fearfulness. Reports panic attacks. Reports ongoing situational stressors. Reports financial struggles. Reports difficulties completing daily tasks. Reports chronic pain issues and multiple health issues. Reports mood is variable. Stating "I'm trying to make it - things are a struggle".Feels like medications continue to be helpful. Continues to work with Dr. Farrel Demark. Varying interest and motivation. Reports  journaling and meditating daily. Taking medications as prescribed.  Energy levels lower. Active, does not have a regular exercise routine with physical limitations.   Unable to enjoy usual interests and activities. Single. Lives alone with cats. Has 2 children. Daughter recently graduated from [employer] and is staying with her for now. Son at [employer]. Appetite adequate. Weight loss. Sleeps better some nights than others.  Focus and concentration difficulties - ADHD. Completing some tasks. Managing some aspects of household. Disabled. Denies SI or HI.  Denies AH or VH. Denies self harm. Denies substance use.  Previous medications: Ritalin and Thorazine - others, Trazadone  Review of Systems:  Review of Systems  Musculoskeletal:  Negative for gait problem.  Neurological:  Negative for tremors.  Psychiatric/Behavioral:         Please refer to HPI    Medications: I have reviewed the patient's current medications.  Current Outpatient Medications  Medication Sig Dispense Refill   albuterol (VENTOLIN HFA) 108 (90 Base) MCG/ACT inhaler Inhale 2 puffs into the lungs every 6 (six) hours as needed for wheezing or shortness of breath. 8 g 2   CANNABIDIOL PO Take by mouth at bedtime.     clonazePAM (KLONOPIN) 1 MG disintegrating tablet Take one tablet four times daily as needed for anxiety and panic attacks. 120 tablet 2   cyclobenzaprine (FLEXERIL) 10 MG tablet TAKE 1 TABLET BY MOUTH EVERY 8 HOURS AS NEEDED FOR 30 DAYS     dicyclomine (BENTYL) 10 MG capsule Take 1 capsule (10 mg total) by mouth 4 (four) times daily -  before meals and at bedtime for 7 days. 28 capsule 0   diphenhydrAMINE (  BENADRYL) 25 mg capsule Take 25 mg by mouth every 6 (six) hours as needed for allergies.     DULoxetine (CYMBALTA) 30 MG capsule Take one capsule daily - tapering up to the 60mg  daily. 30 capsule 0   DULoxetine (CYMBALTA) 60 MG capsule Take 2 capsules (120 mg total) by mouth daily. 60 capsule 2    empagliflozin (JARDIANCE) 25 MG TABS tablet Take 25 mg by mouth daily.     fluticasone (FLONASE) 50 MCG/ACT nasal spray Place 2 sprays into both nostrils daily for 7 days. 15.8 mL 0   HYDROcodone-acetaminophen (NORCO) 5-325 MG tablet Take 1 tablet by mouth every 6 (six) hours as needed for moderate pain. (Patient not taking: Reported on 03/26/2023) 15 tablet 0   hyoscyamine (LEVSIN SL) 0.125 MG SL tablet Place 0.125 mg under the tongue every 6 (six) hours as needed for cramping.     ibuprofen (ADVIL) 800 MG tablet 1 tablet with food or milk as needed     loperamide (IMODIUM) 2 MG capsule Take 1 capsule (2 mg total) by mouth as needed for diarrhea or loose stools. 30 capsule 0   losartan (COZAAR) 50 MG tablet Take 1 tablet (50 mg total) by mouth in the morning and at bedtime. 60 tablet 11   Melatonin 10 MG CAPS Take 10 mg by mouth at bedtime as needed.      metoprolol tartrate (LOPRESSOR) 25 MG tablet TAKE 1 TABLET BY MOUTH TWICE DAILY. (Patient not taking: Reported on 03/26/2023) 60 tablet 11   omeprazole (PRILOSEC) 40 MG capsule Take 1 capsule (40 mg total) by mouth in the morning and at bedtime. 60 capsule 0   ondansetron (ZOFRAN) 4 MG tablet Take 1 tablet (4 mg total) by mouth every 6 (six) hours as needed for nausea or vomiting. 30 tablet 11   ondansetron (ZOFRAN) 4 MG tablet Take 1 tablet (4 mg total) by mouth every 6 (six) hours as needed for nausea. 20 tablet 0   prazosin (MINIPRESS) 2 MG capsule TAKE 1 CAPSULE BY MOUTH EVERYDAY AT BEDTIME 90 capsule 0   prazosin (MINIPRESS) 5 MG capsule Take 1 capsule (5 mg total) by mouth at bedtime. 30 capsule 5   predniSONE (STERAPRED UNI-PAK 21 TAB) 10 MG (21) TBPK tablet As directed on packaging 1 each 0   promethazine (PHENERGAN) 25 MG tablet Take 1 tablet (25 mg total) by mouth every 6 (six) hours as needed for nausea or vomiting. 30 tablet 11   propranolol (INDERAL) 20 MG tablet Take 1 tablet by mouth 2 (two) times daily.     rizatriptan (MAXALT-MLT)  10 MG disintegrating tablet Take 1 tablet (10 mg total) by mouth as needed. May repeat in 2 hours if needed 15 tablet 4   SUMAtriptan (IMITREX) 50 MG tablet Take 50 mg by mouth every 2 (two) hours as needed for migraine. May repeat in 2 hours if headache persists or recurs.     SUMAtriptan (IMITREX) 6 MG/0.5ML SOLN injection Inject 0.5 mLs (6 mg total) into the skin as needed for migraine. May repeat in 2 hours if headache persists or recurs. 10 vial 4   No current facility-administered medications for this visit.    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    Situational stress    Urinary tract infection     Family History  Adopted: Yes  Problem Relation Age of Onset   Colon cancer Neg Hx    Pancreatic cancer Neg Hx    Stomach cancer Neg Hx    Rectal cancer Neg Hx     Social History   Socioeconomic History   Marital status: Divorced    Spouse name: Not on file   Number of children: 2   Years of education: Not on file   Highest education level: Not on file  Occupational History   Occupation: student    Comment: graduated 2018  Tobacco Use   Smoking status: Some Days    Types: Cigarettes   Smokeless tobacco: Never  Vaping Use   Vaping Use: Never used  Substance and Sexual Activity   Alcohol use: Not Currently    Comment: Rare   Drug use: Not Currently   Sexual activity: Not Currently  Other Topics Concern    Not on file  Social History Narrative   Not on file   Social Determinants of Health   Financial Resource Strain: Not on file  Food Insecurity: Not on file  Transportation Needs: Not on file  Physical Activity: Not on file  Stress: Not on file  Social Connections: Not on file  Intimate Partner Violence: Not on file    Past Medical History, Surgical history, Social history, and Family history were reviewed and updated as appropriate.   Please see review of systems for further details on the patient's review from today.   Objective:   Physical Exam:  There were no vitals taken for this visit.  Physical Exam Constitutional:      General: She is not in acute distress. Musculoskeletal:        General: No deformity.  Neurological:     Mental Status: She is alert and oriented to person, place, and time.     Coordination: Coordination normal.  Psychiatric:        Attention and Perception: Attention and perception normal. She does not perceive auditory or visual hallucinations.        Mood and Affect: Mood normal. Mood is not anxious or depressed. Affect is not labile, blunt, angry or inappropriate.        Speech: Speech normal.        Behavior: Behavior normal.        Thought Content: Thought content normal. Thought content is not paranoid or delusional. Thought content does not include homicidal or suicidal ideation. Thought content does not include homicidal or suicidal plan.        Cognition and Memory: Cognition and memory normal.        Judgment: Judgment normal.     Comments: Insight intact     Lab Review:     Component Value Date/Time   NA 135 05/16/2021 2351   NA 140 03/27/2017 1543   K 4.2 05/16/2021 2351   CL 105 05/16/2021 2351   CO2 20 (L) 05/16/2021 2351   GLUCOSE 134 (H) 05/16/2021 2351  BUN 18 05/16/2021 2351   BUN 6 03/27/2017 1543   CREATININE 1.06 (H) 05/16/2021 2351   CREATININE 0.95 09/28/2017 1847   CALCIUM 9.4 05/16/2021 2351   PROT 7.9  05/17/2021 0314   PROT 7.2 03/27/2017 1543   ALBUMIN 4.3 05/17/2021 0314   ALBUMIN 4.5 03/27/2017 1543   AST 16 05/17/2021 0314   ALT 12 05/17/2021 0314   ALKPHOS 83 05/17/2021 0314   BILITOT 0.6 05/17/2021 0314   BILITOT 0.2 03/27/2017 1543   GFRNONAA >60 05/16/2021 2351   GFRNONAA 70 09/28/2017 1847   GFRAA 82 09/28/2017 1847       Component Value Date/Time   WBC 11.3 (H) 05/16/2021 2351   RBC 4.30 05/16/2021 2351   HGB 11.8 (L) 05/16/2021 2351   HGB 12.5 03/27/2017 1543   HCT 35.1 (L) 05/16/2021 2351   HCT 39.0 03/27/2017 1543   PLT 209 05/16/2021 2351   PLT 248 03/27/2017 1543   MCV 81.6 05/16/2021 2351   MCV 87 03/27/2017 1543   MCH 27.4 05/16/2021 2351   MCHC 33.6 05/16/2021 2351   RDW 14.8 05/16/2021 2351   RDW 13.6 03/27/2017 1543   LYMPHSABS 1.7 10/03/2020 0511   LYMPHSABS 1.7 03/27/2017 1543   MONOABS 0.3 10/03/2020 0511   EOSABS 0.2 10/03/2020 0511   EOSABS 0.1 03/27/2017 1543   BASOSABS 0.0 10/03/2020 0511   BASOSABS 0.0 03/27/2017 1543    No results found for: "POCLITH", "LITHIUM"   No results found for: "PHENYTOIN", "PHENOBARB", "VALPROATE", "CBMZ"   .res Assessment: Plan:    Continue:  Cymbalta 120mg  daily  - restart with 30mg  capsule - discussed taper up to previous dose of 120mg . Prazosin 5mg  at hs - nightmares Clonazepam 1mg  4 times daily - ODT from tablet  Continue therapy with [name]  RTC 6 months  Patient advised to contact office with any questions, adverse effects, or acute worsening in signs and symptoms.  Discussed potential benefits, risk, and side effects of benzodiazepines to include potential risk of tolerance and dependence, as well as possible drowsiness.  Advised patient not to drive if experiencing drowsiness and to take lowest possible effective dose to minimize risk of dependence and tolerance.  Discussed potential benefits, risks, and side effects of stimulants with patient to include increased heart rate,  palpitations, insomnia, increased anxiety, increased irritability, or decreased appetite.  Instructed patient to contact office if experiencing any significant tolerability issues.  There are no diagnoses linked to this encounter.  Please see After Visit Summary for patient specific instructions.  Future Appointments  Date Time Provider Department Center  05/31/2023  1:00 PM Robley Fries, PhD [facility] None  06/19/2023  3:00 PM Robley Fries, PhD [facility] None  07/04/2023  3:00 PM Robley Fries, PhD [facility] None  07/18/2023  3:00 PM Robley Fries, PhD [facility] None    No orders of the defined types were placed in this encounter.     -------------------------------

## 2023-05-31 ENCOUNTER — Ambulatory Visit: Payer: Medicaid Other | Admitting: Psychiatry

## 2023-06-05 ENCOUNTER — Other Ambulatory Visit: Payer: Self-pay | Admitting: Adult Health

## 2023-06-05 DIAGNOSIS — F411 Generalized anxiety disorder: Secondary | ICD-10-CM

## 2023-06-19 ENCOUNTER — Ambulatory Visit: Payer: Medicaid Other | Admitting: Psychiatry

## 2023-07-04 ENCOUNTER — Ambulatory Visit (INDEPENDENT_AMBULATORY_CARE_PROVIDER_SITE_OTHER): Payer: Medicaid Other | Admitting: Psychiatry

## 2023-07-04 DIAGNOSIS — F4312 Post-traumatic stress disorder, chronic: Secondary | ICD-10-CM | POA: Diagnosis not present

## 2023-07-04 DIAGNOSIS — G8929 Other chronic pain: Secondary | ICD-10-CM

## 2023-07-04 DIAGNOSIS — Z62820 Parent-biological child conflict: Secondary | ICD-10-CM

## 2023-07-04 DIAGNOSIS — F411 Generalized anxiety disorder: Secondary | ICD-10-CM

## 2023-07-04 DIAGNOSIS — F331 Major depressive disorder, recurrent, moderate: Secondary | ICD-10-CM | POA: Diagnosis not present

## 2023-07-04 DIAGNOSIS — G9332 Myalgic encephalomyelitis/chronic fatigue syndrome: Secondary | ICD-10-CM

## 2023-07-04 DIAGNOSIS — Z599 Problem related to housing and economic circumstances, unspecified: Secondary | ICD-10-CM

## 2023-07-04 DIAGNOSIS — Z905 Acquired absence of kidney: Secondary | ICD-10-CM

## 2023-07-04 DIAGNOSIS — M797 Fibromyalgia: Secondary | ICD-10-CM
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
## 2023-07-06 ENCOUNTER — Other Ambulatory Visit: Payer: Self-pay | Admitting: Nurse Practitioner

## 2023-07-06 DIAGNOSIS — R1033 Periumbilical pain: Secondary | ICD-10-CM

## 2023-07-06 DIAGNOSIS — R112 Nausea with vomiting, unspecified: Secondary | ICD-10-CM

## 2023-07-06 DIAGNOSIS — K638219 Small intestinal bacterial overgrowth, unspecified: Secondary | ICD-10-CM

## 2023-07-13 ENCOUNTER — Ambulatory Visit (INDEPENDENT_AMBULATORY_CARE_PROVIDER_SITE_OTHER): Payer: Medicaid Other | Admitting: Psychiatry

## 2023-07-13 ENCOUNTER — Other Ambulatory Visit: Payer: Medicaid Other

## 2023-07-13 DIAGNOSIS — F411 Generalized anxiety disorder: Secondary | ICD-10-CM | POA: Diagnosis not present

## 2023-07-13 DIAGNOSIS — Z62820 Parent-biological child conflict: Secondary | ICD-10-CM

## 2023-07-13 DIAGNOSIS — F4001 Agoraphobia with panic disorder: Secondary | ICD-10-CM

## 2023-07-13 DIAGNOSIS — F4312 Post-traumatic stress disorder, chronic: Secondary | ICD-10-CM

## 2023-07-13 DIAGNOSIS — F331 Major depressive disorder, recurrent, moderate: Secondary | ICD-10-CM

## 2023-07-13 DIAGNOSIS — M797 Fibromyalgia: Secondary | ICD-10-CM

## 2023-07-13 DIAGNOSIS — G9332 Myalgic encephalomyelitis/chronic fatigue syndrome: Secondary | ICD-10-CM

## 2023-07-13 DIAGNOSIS — G8929 Other chronic pain: Secondary | ICD-10-CM

## 2023-07-13 DIAGNOSIS — F5104 Psychophysiologic insomnia: Secondary | ICD-10-CM

## 2023-07-13 DIAGNOSIS — Z905 Acquired absence of kidney: Secondary | ICD-10-CM

## 2023-07-13 NOTE — Progress Notes (Unsigned)
Psychotherapy Progress Note [facility] [name], PhD LP  Patient ID: Deanna George)    MRN: [id number] Therapy format: Individual psychotherapy Date: 07/13/2023      Start: 3:11p     Stop: 4:24p     Time Spent: 73 min (45, remainder donated) Location: Telehealth visit -- I connected with this patient by an approved telecommunication method (video), with her informed consent, and verifying identity and patient privacy.  I was located at my office and patient at her home.  As needed, we discussed the limitations, risks, and security and privacy concerns associated with telehealth service, including the availability and conditions which currently govern in-person appointments and the possibility that 3rd-party payment may not be fully guaranteed and she may be responsible for charges.  After she indicated understanding, we proceeded with the session.  Also discussed treatment planning, as needed, including ongoing verbal agreement with the plan, the opportunity to ask and answer all questions, her demonstrated understanding of instructions, and her readiness to call the office should symptoms worsen or she feels she is in a crisis state and needs more immediate and tangible assistance.   Session narrative (presenting needs, interim history, self-report of stressors and symptoms, applications of prior therapy, status changes, and interventions made in session) Scheduled urgently, citing something "traumatic" on Monday.  Spent 15 minutes on tech matters   Recent provocation in that Deanna George has been sharing bits of news on her father, tom, Flossie's ex, and his fracturing relationship with French Ana, his longtime girlfriend.  For her part, Deanna George has been trying to be big enough to hear about him, then the idea came up about French Ana wanting to get in touch with Deanna George to check her doubts whether Elijah Birk is gay.  Dilemma for Deanna George over what the "boundaries" are supposed to be with French Ana getting  involved with her daughter, having her bells rung about what seems like too many incursions, and worry over whether  Unfortunately, family women including stepmother have been pumping paranoid ideas that Deanna George was walking into some kind of a trap   At this point, Deanna George already went on the trip with French Ana and has spent an extra night   Som insight that she has been trying too hard to manage and "save" Deanna George from herself.    Re. GI issue, not eating for most of a week.  Recommended get calories in any gentle form she can handle and make sure electrolytres are OK.  Probably not in need of antibiotics, though they have ben called in.  Side note of severe GI pain last Friday after a beef taco, including violent vomiting,   Therapeutic modalities: {AM:23362::"Cognitive Behavioral Therapy","Solution-Oriented/Positive Psychology"}  Mental Status/Observations:  Appearance:   {PSY:22683}     Behavior:  {PSY:21022743}  Motor:  {PSY:22302}  Speech/Language:   normal  Affect:  {PSY:22687}  Mood:  {PSY:31886}  Thought process:  Obsessive, overinclusive  Thought content:    {PSY:[id number]}  Sensory/Perceptual disturbances:    {PSY:985-341-5066}  Orientation:  {Psych Orientation:23301::"Fully oriented"}  Attention:  {Good-Fair-Poor ratings:23770::"Good"}    Concentration:  {Good-Fair-Poor ratings:23770::"Good"}  Memory:  {PSY:302-187-0405}  Insight:    {Good-Fair-Poor ratings:23770::"Good"}  Judgment:   {Good-Fair-Poor ratings:23770::"Good"}  Impulse Control:  {Good-Fair-Poor ratings:23770::"Good"}   Risk Assessment: Danger to Self: {Risk:22599::"No"} Self-injurious Behavior: {Risk:22599::"No"} Danger to Others: {Risk:22599::"No"} Physical Aggression / Violence: {Risk:22599::"No"} Duty to Warn: {AMYesNo:22526::"No"} Access to Firearms a concern: {AMYesNo:22526::"No"}  Assessment of progress:  {Progress:22147::"progressing"}  Diagnosis: No diagnosis found. Plan:  *** Other  recommendations/advice -- As may be noted above.  Continue to utilize previously learned skills ad lib. Medication compliance -- Maintain medication as prescribed and work faithfully with relevant prescriber(s) if any changes are desired or seem indicated. Crisis service -- Aware of call list and work-in appts.  Call the clinic on-call service, 988/hotline, 911, or present to [facility] or ER if any life-threatening psychiatric crisis. Followup -- Return for time as already scheduled.  Next scheduled visit with me 07/18/2023.  Next scheduled in this office 07/18/2023.  Robley Fries, PhD [name], PhD LP Clinical Psychologist, [facility] [address] [place], [place] [address] [phone]

## 2023-07-18 ENCOUNTER — Ambulatory Visit (INDEPENDENT_AMBULATORY_CARE_PROVIDER_SITE_OTHER): Payer: Medicaid Other | Admitting: Psychiatry

## 2023-07-18 DIAGNOSIS — Z905 Acquired absence of kidney: Secondary | ICD-10-CM

## 2023-07-18 DIAGNOSIS — F331 Major depressive disorder, recurrent, moderate: Secondary | ICD-10-CM

## 2023-07-18 DIAGNOSIS — F4001 Agoraphobia with panic disorder: Secondary | ICD-10-CM

## 2023-07-18 DIAGNOSIS — F411 Generalized anxiety disorder: Secondary | ICD-10-CM | POA: Diagnosis not present

## 2023-07-18 DIAGNOSIS — F5104 Psychophysiologic insomnia: Secondary | ICD-10-CM

## 2023-07-18 DIAGNOSIS — G9332 Myalgic encephalomyelitis/chronic fatigue syndrome: Secondary | ICD-10-CM

## 2023-07-18 DIAGNOSIS — M797 Fibromyalgia: Secondary | ICD-10-CM

## 2023-07-18 DIAGNOSIS — F4312 Post-traumatic stress disorder, chronic: Secondary | ICD-10-CM

## 2023-07-18 DIAGNOSIS — G8929 Other chronic pain: Secondary | ICD-10-CM

## 2023-07-18 DIAGNOSIS — Z62820 Parent-biological child conflict: Secondary | ICD-10-CM

## 2023-07-18 DIAGNOSIS — Z599 Problem related to housing and economic circumstances, unspecified: Secondary | ICD-10-CM
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
## 2023-07-30 ENCOUNTER — Encounter: Payer: Self-pay | Admitting: Adult Health

## 2023-07-30 ENCOUNTER — Ambulatory Visit (INDEPENDENT_AMBULATORY_CARE_PROVIDER_SITE_OTHER): Payer: Medicaid Other | Admitting: Adult Health

## 2023-07-30 ENCOUNTER — Telehealth: Payer: Self-pay | Admitting: Adult Health

## 2023-07-30 DIAGNOSIS — F331 Major depressive disorder, recurrent, moderate: Secondary | ICD-10-CM

## 2023-07-30 DIAGNOSIS — F4001 Agoraphobia with panic disorder: Secondary | ICD-10-CM

## 2023-07-30 DIAGNOSIS — F411 Generalized anxiety disorder: Secondary | ICD-10-CM | POA: Diagnosis not present

## 2023-07-30 DIAGNOSIS — F4312 Post-traumatic stress disorder, chronic: Secondary | ICD-10-CM

## 2023-07-30 DIAGNOSIS — F5104 Psychophysiologic insomnia: Secondary | ICD-10-CM

## 2023-07-30 MED ORDER — OLANZAPINE 2.5 MG PO TABS: 2.5000 mg | ORAL_TABLET | Freq: Every day | ORAL | 2 refills | Status: DC

## 2023-07-30 NOTE — Telephone Encounter (Signed)
Pharmacy sent PA request for Olanzapine 2.5mg  tabs, see CMM

## 2023-07-30 NOTE — Progress Notes (Signed)
Deanna George [id number] 09/15/1966 57 y.o.  Subjective:   Patient ID:  Deanna George is a 57 y.o. (DOB [birth year]) female.  Chief Complaint: No chief complaint on file.   HPI Deanna George presents to the office today for follow-up of PTSD, GAD, MDD, insomnia and panic disorder.  Describes mood today as "not good". Pleasant. Tearful at times. Mood symptoms - reports depression, irritability and anxiety. Reports worry, rumination, and over thinking. Reports panic attacks. Reports ongoing situational stressors. Reports financial struggles. Reports difficulties completing daily tasks. Reports chronic pain issues and multiple health issues. Reports mood is variable. Stating "I'm looking for relief". Feels like medications continue to be helpful. Continues to work with Dr. Farrel Demark. Varying interest and motivation. Reports journaling and meditating daily. Taking medications as prescribed.  Energy levels low. Active, does not have a regular exercise routine with physical limitations.   Unable to enjoy usual interests and activities. Single. Lives alone with cat. Has 2 children. Daughter recently graduated from [employer] and is staying with her for now - will be moving out soon. Son at [employer]. Appetite adequate. Weight loss.  Reports sleeping difficulties - waking up 3 x a night. Reports waking up through out the night. Giving cat insulin twice during the night.  Focus and concentration difficulties - ADHD. Completing some tasks. Managing some aspects of household. Disabled. Denies SI or HI.  Denies AH or VH. Denies self harm. Reports smoking THC.  Previous medications: Ritalin and Thorazine - others, Trazadone   Flowsheet Row Admission (Discharged) from 10/28/2021 in [facility] from 05/17/2021 in [facility] Emergency Department at [facility]  C-SSRS RISK CATEGORY No Risk No Risk        Review of Systems:  Review of Systems  Musculoskeletal:   Negative for gait problem.  Neurological:  Negative for tremors.  Psychiatric/Behavioral:         Please refer to HPI    Medications: I have reviewed the patient's current medications.  Current Outpatient Medications  Medication Sig Dispense Refill   albuterol (VENTOLIN HFA) 108 (90 Base) MCG/ACT inhaler Inhale 2 puffs into the lungs every 6 (six) hours as needed for wheezing or shortness of breath. 8 g 2   CANNABIDIOL PO Take by mouth at bedtime.     clonazePAM (KLONOPIN) 1 MG disintegrating tablet Take one tablet four times daily as needed for anxiety and panic attacks. 120 tablet 2   cyclobenzaprine (FLEXERIL) 10 MG tablet TAKE 1 TABLET BY MOUTH EVERY 8 HOURS AS NEEDED FOR 30 DAYS     dicyclomine (BENTYL) 10 MG capsule Take 1 capsule (10 mg total) by mouth 4 (four) times daily -  before meals and at bedtime for 7 days. 28 capsule 0   diphenhydrAMINE (BENADRYL) 25 mg capsule Take 25 mg by mouth every 6 (six) hours as needed for allergies.     DULoxetine (CYMBALTA) 30 MG capsule Take one capsule daily - tapering up to the 60mg  daily. 30 capsule 0   DULoxetine (CYMBALTA) 60 MG capsule Take 2 capsules (120 mg total) by mouth daily. 60 capsule 5   empagliflozin (JARDIANCE) 25 MG TABS tablet Take 25 mg by mouth daily.     fluticasone (FLONASE) 50 MCG/ACT nasal spray Place 2 sprays into both nostrils daily for 7 days. 15.8 mL 0   HYDROcodone-acetaminophen (NORCO) 5-325 MG tablet Take 1 tablet by mouth every 6 (six) hours as needed for moderate pain. (Patient not taking: Reported on  03/26/2023) 15 tablet 0   hyoscyamine (LEVSIN SL) 0.125 MG SL tablet Place 0.125 mg under the tongue every 6 (six) hours as needed for cramping.     ibuprofen (ADVIL) 800 MG tablet 1 tablet with food or milk as needed     loperamide (IMODIUM) 2 MG capsule Take 1 capsule (2 mg total) by mouth as needed for diarrhea or loose stools. 30 capsule 0   losartan (COZAAR) 50 MG tablet Take 1 tablet (50 mg total) by mouth in the  morning and at bedtime. 60 tablet 11   Melatonin 10 MG CAPS Take 10 mg by mouth at bedtime as needed.      metoprolol tartrate (LOPRESSOR) 25 MG tablet TAKE 1 TABLET BY MOUTH TWICE DAILY. (Patient not taking: Reported on 03/26/2023) 60 tablet 11   omeprazole (PRILOSEC) 40 MG capsule Take 1 capsule (40 mg total) by mouth in the morning and at bedtime. 60 capsule 0   ondansetron (ZOFRAN) 4 MG tablet Take 1 tablet (4 mg total) by mouth every 6 (six) hours as needed for nausea or vomiting. 30 tablet 11   ondansetron (ZOFRAN) 4 MG tablet Take 1 tablet (4 mg total) by mouth every 6 (six) hours as needed for nausea. 20 tablet 0   prazosin (MINIPRESS) 5 MG capsule Take 1 capsule (5 mg total) by mouth at bedtime. 30 capsule 5   predniSONE (STERAPRED UNI-PAK 21 TAB) 10 MG (21) TBPK tablet As directed on packaging 1 each 0   promethazine (PHENERGAN) 25 MG tablet Take 1 tablet (25 mg total) by mouth every 6 (six) hours as needed for nausea or vomiting. 30 tablet 11   propranolol (INDERAL) 20 MG tablet Take 1 tablet by mouth 2 (two) times daily.     rizatriptan (MAXALT-MLT) 10 MG disintegrating tablet Take 1 tablet (10 mg total) by mouth as needed. May repeat in 2 hours if needed 15 tablet 4   SUMAtriptan (IMITREX) 50 MG tablet Take 50 mg by mouth every 2 (two) hours as needed for migraine. May repeat in 2 hours if headache persists or recurs.     SUMAtriptan (IMITREX) 6 MG/0.5ML SOLN injection Inject 0.5 mLs (6 mg total) into the skin as needed for migraine. May repeat in 2 hours if headache persists or recurs. 10 vial 4   No current facility-administered medications for this visit.    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   Please see review of systems for further details on the patient's review from today.   Objective:   Physical Exam:  There were no vitals taken for this visit.  Physical Exam Constitutional:  General: She is not in acute distress. Musculoskeletal:        General: No deformity.  Neurological:     Mental Status: She is alert and oriented to person, place, and time.     Coordination: Coordination normal.  Psychiatric:        Attention and Perception: Attention and perception normal. She does not perceive auditory or visual hallucinations.        Mood and Affect: Affect is not labile, blunt, angry or inappropriate.        Speech: Speech normal.        Behavior: Behavior normal.        Thought Content: Thought content normal. Thought content is not paranoid or delusional. Thought content does not include homicidal or suicidal ideation. Thought content does not include homicidal or suicidal plan.        Cognition and Memory: Cognition and memory normal.        Judgment: Judgment normal.     Comments: Insight intact     Lab Review:     Component Value Date/Time   NA 135 05/16/2021 2351   NA 140 03/27/2017 1543   K 4.2 05/16/2021 2351   CL 105 05/16/2021 2351   CO2 20 (L) 05/16/2021 2351   GLUCOSE 134 (H) 05/16/2021 2351   BUN 18 05/16/2021 2351   BUN 6 03/27/2017 1543   CREATININE 1.06 (H) 05/16/2021 2351   CREATININE 0.95 09/28/2017 1847   CALCIUM 9.4 05/16/2021 2351   PROT 7.9 05/17/2021 0314   PROT 7.2  03/27/2017 1543   ALBUMIN 4.3 05/17/2021 0314   ALBUMIN 4.5 03/27/2017 1543   AST 16 05/17/2021 0314   ALT 12 05/17/2021 0314   ALKPHOS 83 05/17/2021 0314   BILITOT 0.6 05/17/2021 0314   BILITOT 0.2 03/27/2017 1543   GFRNONAA >60 05/16/2021 2351   GFRNONAA 70 09/28/2017 1847   GFRAA 82 09/28/2017 1847       Component Value Date/Time   WBC 11.3 (H) 05/16/2021 2351   RBC 4.30 05/16/2021 2351   HGB 11.8 (L) 05/16/2021 2351   HGB 12.5 03/27/2017 1543   HCT 35.1 (L) 05/16/2021 2351   HCT 39.0 03/27/2017 1543   PLT 209 05/16/2021 2351   PLT 248 03/27/2017 1543   MCV 81.6 05/16/2021 2351   MCV 87 03/27/2017 1543   MCH 27.4 05/16/2021 2351   MCHC 33.6 05/16/2021 2351   RDW 14.8 05/16/2021 2351   RDW 13.6 03/27/2017 1543   LYMPHSABS 1.7 10/03/2020 0511   LYMPHSABS 1.7 03/27/2017 1543   MONOABS 0.3 10/03/2020 0511   EOSABS 0.2 10/03/2020 0511   EOSABS 0.1 03/27/2017 1543   BASOSABS 0.0 10/03/2020 0511   BASOSABS 0.0 03/27/2017 1543    No results found for: "POCLITH", "LITHIUM"   No results found for: "PHENYTOIN", "PHENOBARB", "VALPROATE", "CBMZ"   .res Assessment: Plan:    Continue:  Cymbalta 30mg  daily - decreased from 120mg  while on the Flagyl Prazosin 5mg  at hs - nightmares - takes 10mg  3 times a week. Clonazepam 1mg  4 times daily - ODT from tablet - took extra for 5 days   Add Zyprexa 2.5mg  at hs  Continue therapy with [name]  RTC 6 months  Patient advised to contact office with any questions, adverse effects, or acute worsening in signs and symptoms.  Discussed potential benefits, risk, and side effects of benzodiazepines to include potential risk of tolerance and dependence, as well as possible drowsiness.  Advised patient not to drive if experiencing  drowsiness and to take lowest possible effective dose to minimize risk of dependence and tolerance.  Discussed potential benefits, risks, and side effects of stimulants with patient to include increased  heart rate, palpitations, insomnia, increased anxiety, increased irritability, or decreased appetite.  Instructed patient to contact office if experiencing any significant tolerability issues.  There are no diagnoses linked to this encounter.   Please see After Visit Summary for patient specific instructions.  Future Appointments  Date Time Provider Department Center  07/30/2023  2:00 PM Jolie Strohecker, Thereasa Solo, NP [facility] None  08/01/2023  1:00 PM Robley Fries, PhD [facility] None  08/10/2023 10:00 AM [facility]  08/13/2023  3:00 PM Robley Fries, PhD [facility] None  08/23/2023  2:00 PM Robley Fries, PhD [facility] None  09/12/2023  1:00 PM Robley Fries, PhD [facility] None  09/26/2023  2:00 PM Robley Fries, PhD [facility] None  10/18/2023  1:00 PM Robley Fries, PhD [facility] None    No orders of the defined types were placed in this encounter.   -------------------------------

## 2023-08-01 ENCOUNTER — Ambulatory Visit: Payer: Medicaid Other | Admitting: Psychiatry

## 2023-08-01 DIAGNOSIS — F5104 Psychophysiologic insomnia: Secondary | ICD-10-CM | POA: Diagnosis not present

## 2023-08-01 DIAGNOSIS — F331 Major depressive disorder, recurrent, moderate: Secondary | ICD-10-CM

## 2023-08-01 DIAGNOSIS — F4312 Post-traumatic stress disorder, chronic: Secondary | ICD-10-CM | POA: Diagnosis not present

## 2023-08-01 DIAGNOSIS — G9332 Myalgic encephalomyelitis/chronic fatigue syndrome: Secondary | ICD-10-CM

## 2023-08-01 DIAGNOSIS — F411 Generalized anxiety disorder: Secondary | ICD-10-CM

## 2023-08-01 DIAGNOSIS — Z905 Acquired absence of kidney: Secondary | ICD-10-CM

## 2023-08-01 DIAGNOSIS — M797 Fibromyalgia: Secondary | ICD-10-CM

## 2023-08-01 DIAGNOSIS — Z62820 Parent-biological child conflict: Secondary | ICD-10-CM

## 2023-08-01 DIAGNOSIS — G8929 Other chronic pain: Secondary | ICD-10-CM

## 2023-08-01 DIAGNOSIS — F4001 Agoraphobia with panic disorder: Secondary | ICD-10-CM | POA: Diagnosis not present
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
## 2023-08-02 ENCOUNTER — Telehealth: Payer: Self-pay | Admitting: Adult Health

## 2023-08-02 NOTE — Telephone Encounter (Signed)
Called patient back. Gina informed.
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
## 2023-08-02 NOTE — Telephone Encounter (Signed)
Called patient and she was just waking up. She said she had an SSI renewal meeting and she was trying to stay calm for that. I asked her if she wanted me to call back later so any conversation would not upset her. She asked me to call back after lunch. I asked if she felt safe and she said she did. Will call back this afternoon.

## 2023-08-02 NOTE — Telephone Encounter (Signed)
Please see message. You saw her 9/16. Do I need to FU with her today?

## 2023-08-08 ENCOUNTER — Other Ambulatory Visit: Payer: Self-pay | Admitting: Nurse Practitioner

## 2023-08-08 DIAGNOSIS — R1084 Generalized abdominal pain: Secondary | ICD-10-CM

## 2023-08-08 DIAGNOSIS — R1033 Periumbilical pain: Secondary | ICD-10-CM

## 2023-08-08 DIAGNOSIS — R112 Nausea with vomiting, unspecified: Secondary | ICD-10-CM

## 2023-08-13 ENCOUNTER — Ambulatory Visit (INDEPENDENT_AMBULATORY_CARE_PROVIDER_SITE_OTHER): Payer: Medicaid Other | Admitting: Psychiatry

## 2023-08-13 DIAGNOSIS — Z62820 Parent-biological child conflict: Secondary | ICD-10-CM

## 2023-08-13 DIAGNOSIS — F331 Major depressive disorder, recurrent, moderate: Secondary | ICD-10-CM

## 2023-08-13 DIAGNOSIS — F411 Generalized anxiety disorder: Secondary | ICD-10-CM

## 2023-08-13 DIAGNOSIS — F4001 Agoraphobia with panic disorder: Secondary | ICD-10-CM | POA: Diagnosis not present

## 2023-08-13 DIAGNOSIS — F5104 Psychophysiologic insomnia: Secondary | ICD-10-CM

## 2023-08-13 DIAGNOSIS — Z599 Problem related to housing and economic circumstances, unspecified: Secondary | ICD-10-CM

## 2023-08-13 DIAGNOSIS — F4312 Post-traumatic stress disorder, chronic: Secondary | ICD-10-CM | POA: Diagnosis not present

## 2023-08-13 DIAGNOSIS — G9332 Myalgic encephalomyelitis/chronic fatigue syndrome: Secondary | ICD-10-CM

## 2023-08-13 DIAGNOSIS — M797 Fibromyalgia: Secondary | ICD-10-CM

## 2023-08-13 DIAGNOSIS — G8929 Other chronic pain: Secondary | ICD-10-CM

## 2023-08-13 DIAGNOSIS — Z905 Acquired absence of kidney: Secondary | ICD-10-CM
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
## 2023-08-20 ENCOUNTER — Ambulatory Visit
Admission: RE | Admit: 2023-08-20 | Discharge: 2023-08-20 | Disposition: A | Discharge status: Home / Self Care | Payer: Medicaid Other | Source: Ambulatory Visit | Attending: Nurse Practitioner | Admitting: Nurse Practitioner

## 2023-08-20 DIAGNOSIS — R1033 Periumbilical pain: Secondary | ICD-10-CM | POA: Diagnosis present

## 2023-08-20 DIAGNOSIS — R112 Nausea with vomiting, unspecified: Secondary | ICD-10-CM | POA: Insufficient documentation

## 2023-08-20 DIAGNOSIS — R1084 Generalized abdominal pain: Secondary | ICD-10-CM | POA: Insufficient documentation

## 2023-08-20 LAB — POCT I-STAT CREATININE: Creatinine, Ser: 1.2 mg/dL — ABNORMAL HIGH (ref 0.44–1.00)

## 2023-08-20 MED ORDER — BARIUM SULFATE 0.1 % PO SUSP
1350.0000 mL | Freq: Once | ORAL | Status: AC
  Administered 2023-08-20: 1350 mL via ORAL

## 2023-08-20 MED ORDER — IOHEXOL 300 MG/ML  SOLN
80.0000 mL | Freq: Once | INTRAMUSCULAR | Status: AC | PRN
  Administered 2023-08-20: 100 mL via INTRAVENOUS

## 2023-08-22 ENCOUNTER — Encounter: Payer: Self-pay | Admitting: Orthopedic Surgery

## 2023-08-22 ENCOUNTER — Other Ambulatory Visit: Payer: Self-pay | Admitting: Orthopedic Surgery

## 2023-08-22 DIAGNOSIS — S83241A Other tear of medial meniscus, current injury, right knee, initial encounter: Secondary | ICD-10-CM

## 2023-08-22 DIAGNOSIS — M25561 Pain in right knee: Secondary | ICD-10-CM

## 2023-08-23 ENCOUNTER — Ambulatory Visit: Payer: Medicaid Other | Admitting: Psychiatry

## 2023-08-23 DIAGNOSIS — Z599 Problem related to housing and economic circumstances, unspecified: Secondary | ICD-10-CM

## 2023-08-23 DIAGNOSIS — G9332 Myalgic encephalomyelitis/chronic fatigue syndrome: Secondary | ICD-10-CM

## 2023-08-23 DIAGNOSIS — F4312 Post-traumatic stress disorder, chronic: Secondary | ICD-10-CM

## 2023-08-23 DIAGNOSIS — Z62820 Parent-biological child conflict: Secondary | ICD-10-CM

## 2023-08-23 DIAGNOSIS — F4001 Agoraphobia with panic disorder: Secondary | ICD-10-CM

## 2023-08-23 DIAGNOSIS — F5104 Psychophysiologic insomnia: Secondary | ICD-10-CM

## 2023-08-23 DIAGNOSIS — M797 Fibromyalgia: Secondary | ICD-10-CM

## 2023-08-23 DIAGNOSIS — F331 Major depressive disorder, recurrent, moderate: Secondary | ICD-10-CM

## 2023-08-23 DIAGNOSIS — F411 Generalized anxiety disorder: Secondary | ICD-10-CM | POA: Diagnosis not present

## 2023-08-23 DIAGNOSIS — G8929 Other chronic pain: Secondary | ICD-10-CM
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
## 2023-08-24 ENCOUNTER — Ambulatory Visit: Payer: Medicaid Other | Admitting: Psychiatry

## 2023-08-27 ENCOUNTER — Telehealth: Payer: Medicaid Other | Admitting: Adult Health

## 2023-09-03 ENCOUNTER — Ambulatory Visit: Payer: Medicaid Other | Admitting: Psychiatry

## 2023-09-03 DIAGNOSIS — F331 Major depressive disorder, recurrent, moderate: Secondary | ICD-10-CM

## 2023-09-03 DIAGNOSIS — G8929 Other chronic pain: Secondary | ICD-10-CM

## 2023-09-03 DIAGNOSIS — F5104 Psychophysiologic insomnia: Secondary | ICD-10-CM | POA: Diagnosis not present

## 2023-09-03 DIAGNOSIS — F4312 Post-traumatic stress disorder, chronic: Secondary | ICD-10-CM

## 2023-09-03 DIAGNOSIS — F411 Generalized anxiety disorder: Secondary | ICD-10-CM

## 2023-09-03 DIAGNOSIS — Z62820 Parent-biological child conflict: Secondary | ICD-10-CM
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
## 2023-09-04 ENCOUNTER — Encounter: Payer: Self-pay | Admitting: Orthopedic Surgery

## 2023-09-06 ENCOUNTER — Ambulatory Visit
Admission: RE | Admit: 2023-09-06 | Discharge: 2023-09-06 | Disposition: A | Discharge status: Home / Self Care | Payer: Medicaid Other | Source: Ambulatory Visit | Attending: Orthopedic Surgery | Admitting: Orthopedic Surgery

## 2023-09-06 DIAGNOSIS — M25561 Pain in right knee: Secondary | ICD-10-CM

## 2023-09-06 DIAGNOSIS — S83241A Other tear of medial meniscus, current injury, right knee, initial encounter: Secondary | ICD-10-CM

## 2023-09-12 ENCOUNTER — Ambulatory Visit: Payer: Medicaid Other | Admitting: Psychiatry

## 2023-09-12 DIAGNOSIS — M797 Fibromyalgia: Secondary | ICD-10-CM

## 2023-09-12 DIAGNOSIS — F411 Generalized anxiety disorder: Secondary | ICD-10-CM | POA: Diagnosis not present

## 2023-09-12 DIAGNOSIS — F331 Major depressive disorder, recurrent, moderate: Secondary | ICD-10-CM | POA: Diagnosis not present

## 2023-09-12 DIAGNOSIS — G8929 Other chronic pain: Secondary | ICD-10-CM

## 2023-09-12 DIAGNOSIS — Z599 Problem related to housing and economic circumstances, unspecified: Secondary | ICD-10-CM

## 2023-09-12 DIAGNOSIS — G9332 Myalgic encephalomyelitis/chronic fatigue syndrome: Secondary | ICD-10-CM

## 2023-09-12 DIAGNOSIS — F5104 Psychophysiologic insomnia: Secondary | ICD-10-CM | POA: Diagnosis not present

## 2023-09-12 DIAGNOSIS — F4312 Post-traumatic stress disorder, chronic: Secondary | ICD-10-CM | POA: Diagnosis not present

## 2023-09-12 DIAGNOSIS — Z62820 Parent-biological child conflict: Secondary | ICD-10-CM
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
## 2023-09-16 NOTE — Telephone Encounter (Signed)
PA for olanzapine 2.5 mg #90/90 [employer]  Approved Effective:  08/30/23-09/03/24

## 2023-09-26 ENCOUNTER — Encounter: Payer: Self-pay | Admitting: Psychiatry

## 2023-09-26 ENCOUNTER — Ambulatory Visit (INDEPENDENT_AMBULATORY_CARE_PROVIDER_SITE_OTHER): Payer: Medicaid Other | Admitting: Psychiatry

## 2023-09-26 DIAGNOSIS — G8929 Other chronic pain: Secondary | ICD-10-CM

## 2023-09-26 DIAGNOSIS — F5104 Psychophysiologic insomnia: Secondary | ICD-10-CM

## 2023-09-26 DIAGNOSIS — F331 Major depressive disorder, recurrent, moderate: Secondary | ICD-10-CM | POA: Diagnosis not present

## 2023-09-26 DIAGNOSIS — F4312 Post-traumatic stress disorder, chronic: Secondary | ICD-10-CM | POA: Diagnosis not present

## 2023-09-26 DIAGNOSIS — Z599 Problem related to housing and economic circumstances, unspecified: Secondary | ICD-10-CM

## 2023-09-26 DIAGNOSIS — Z62820 Parent-biological child conflict: Secondary | ICD-10-CM

## 2023-09-26 DIAGNOSIS — F909 Attention-deficit hyperactivity disorder, unspecified type: Secondary | ICD-10-CM

## 2023-09-26 DIAGNOSIS — F411 Generalized anxiety disorder: Secondary | ICD-10-CM

## 2023-09-26 NOTE — Progress Notes (Unsigned)
 Psychotherapy Progress Note [facility] [name], PhD LP  Patient ID: YATANA PIPE)    MRN: [id number] Therapy format: Individual psychotherapy Date: 09/26/2023      Start: 2:15p     Stop: 3:15p     Time Spent: 60 min Location: Telehealth visit -- I connected with this patient by an approved telecommunication method (video), with her informed consent, and verifying identity and patient privacy.  I was located at my office and patient at her home.  As needed, we discussed the limitations, risks, and security and privacy concerns associated with telehealth service, including the availability and conditions which currently govern in-person appointments and the possibility that 3rd-party payment may not be fully guaranteed and she may be responsible for charges.  After she indicated understanding, we proceeded with the session.  Also discussed treatment planning, as needed, including ongoing verbal agreement with the plan, the opportunity to ask and answer all questions, her demonstrated understanding of instructions, and her readiness to call the office should symptoms worsen or she feels she is in a crisis state and needs more immediate and tangible assistance.   Session narrative (presenting needs, interim history, self-report of stressors and symptoms, applications of prior therapy, status changes, and interventions made in session) Got around to filling her olanzapine  but concerned after reading online that it's used for schizophrenia, "defnitely not" sleep treatment.  Allayed all fears of being labelled, med interactions, or developing psychosis.  Had a "brownout" this morning in the kitchen, seems to be a matter of being entirely exhausted from yesterday's activity, prolonged sleep deprivation, and not eating enough.  Managed to catch herself partway and only bruise her back.  Checked dosing of prazosin , noted as swapping 5mg  and 10mg  2 months ago.  Complex routine right  now is 11pm take 5mg  prazosin , give it an hour, take 1mg  clonazepam , 2:30-3am fall asleep if all goes well, all in addition to aromatherapy and CBD.  If a more provocative day for anxiety, she is cleared by psychiatry to take a 2nd clonazepam .  Does not double prazosin , contrary to note, but may take 2nd.  Typically sleeps on couch, which reclines.    Advised it is OK to combine the meds she has -- not compulsively put them an hour apart and watch to see how they work -- just combine, trust, and give over to sleep as soon as able.  Probed her mindset when pursuing her sometimes elaborate ritual to sleep, which involves a stuffed animal, burning incense, a tealight candel, meds as mentioned, a warming blanket, and a sound background (currently rain sounds on YouTube, sometimes a soothing voice reading).  Probed the spirit of these things and her prayers, encouraged to keep it clear that she can deal with variations, and to make authentic decisions Encouraged in 3 Cs for sleep -- comfort, consent, and chemistry -- as well as good sleep hygiene, including catching worry as the occasion to decide if she means to be up problem-solving, and if so, to get out of bed   Still running low on vitamin D  and plausibly B12.  Complicated affording vitamins, but working on it with [facility] system.  Currently down to about 163 lbs, and medicine is concerned with muscle loss.    Therapeutic modalities: {AM:23362::"Cognitive Behavioral Therapy","Solution-Oriented/Positive Psychology"}  Mental Status/Observations:  Appearance:   {PSY:22683}     Behavior:  {PSY:21022743}  Motor:  {PSY:22302}  Speech/Language:   {PSY:22685}  Affect:  {PSY:22687}  Mood:  {PSY:31886}  Thought process:  {NFA:21308}  Thought content:    {[phone]}  Sensory/Perceptual disturbances:    {[phone]}  Orientation:  {Psych Orientation:23301::"Fully oriented"}  Attention:  {Good-Fair-Poor ratings:23770::"Good"}    Concentration:   {Good-Fair-Poor ratings:23770::"Good"}  Memory:  {[phone]}  Insight:    {Good-Fair-Poor ratings:23770::"Good"}  Judgment:   {Good-Fair-Poor ratings:23770::"Good"}  Impulse Control:  {Good-Fair-Poor ratings:23770::"Good"}   Risk Assessment: Danger to Self: {Risk:22599::"No"} Self-injurious Behavior: {Risk:22599::"No"} Danger to Others: {Risk:22599::"No"} Physical Aggression / Violence: {Risk:22599::"No"} Duty to Warn: {AMYesNo:22526::"No"} Access to Firearms a concern: {AMYesNo:22526::"No"}  Assessment of progress:  {Progress:22147::"progressing"}  Diagnosis: No diagnosis found. Plan:  *** Other recommendations/advice -- As may be noted above.  Continue to utilize previously learned skills ad lib. Medication compliance -- Maintain medication as prescribed and work faithfully with relevant prescriber(s) if any changes are desired or seem indicated. Crisis service -- Aware of call list and work-in appts.  Call the clinic on-call service, 988/hotline, 911, or present to [facility] or ER if any life-threatening psychiatric crisis. Followup -- Return for recommend sched ahead q 2 wks if able.  Next scheduled visit with me 10/18/2023.  Next scheduled in this office 09/28/2023.  Maretta Shaper, PhD [name], PhD LP Clinical Psychologist, [facility] [address] [place], [place] [address] [phone]

## 2023-09-28 ENCOUNTER — Ambulatory Visit: Payer: Medicaid Other | Admitting: Adult Health

## 2023-09-28 ENCOUNTER — Encounter: Payer: Self-pay | Admitting: Adult Health

## 2023-09-28 DIAGNOSIS — G4700 Insomnia, unspecified: Secondary | ICD-10-CM

## 2023-09-28 DIAGNOSIS — F410 Panic disorder [episodic paroxysmal anxiety] without agoraphobia: Secondary | ICD-10-CM | POA: Diagnosis not present

## 2023-09-28 DIAGNOSIS — F411 Generalized anxiety disorder: Secondary | ICD-10-CM | POA: Diagnosis not present

## 2023-09-28 DIAGNOSIS — F4310 Post-traumatic stress disorder, unspecified: Secondary | ICD-10-CM

## 2023-09-28 DIAGNOSIS — F4312 Post-traumatic stress disorder, chronic: Secondary | ICD-10-CM

## 2023-09-28 DIAGNOSIS — F5104 Psychophysiologic insomnia: Secondary | ICD-10-CM

## 2023-09-28 DIAGNOSIS — F331 Major depressive disorder, recurrent, moderate: Secondary | ICD-10-CM

## 2023-09-28 DIAGNOSIS — F4001 Agoraphobia with panic disorder: Secondary | ICD-10-CM

## 2023-09-28 MED ORDER — DULOXETINE HCL 60 MG PO CPEP: 120.0000 mg | ORAL_CAPSULE | Freq: Every day | ORAL | 5 refills | Status: DC

## 2023-09-28 MED ORDER — CLONAZEPAM 1 MG PO TBDP: ORAL_TABLET | ORAL | 2 refills | Status: DC

## 2023-09-28 MED ORDER — PRAZOSIN HCL 5 MG PO CAPS: 5.0000 mg | ORAL_CAPSULE | Freq: Every day | ORAL | 5 refills | Status: DC

## 2023-09-28 NOTE — Progress Notes (Signed)
Deanna George [id number] 1966-01-13 57 y.o.  Virtual Visit via Telephone Note  I connected with pt on 09/28/23 at  3:00 PM EST by telephone and verified that I am speaking with the correct person using two identifiers.   I discussed the limitations, risks, security and privacy concerns of performing an evaluation and management service by telephone and the availability of in person appointments. I also discussed with the patient that there may be a patient responsible charge related to this service. The patient expressed understanding and agreed to proceed.   I discussed the assessment and treatment plan with the patient. The patient was provided an opportunity to ask questions and all were answered. The patient agreed with the plan and demonstrated an understanding of the instructions.   The patient was advised to call back or seek an in-person evaluation if the symptoms worsen or if the condition fails to improve as anticipated.  I provided 25 minutes of non-face-to-face time during this encounter.  The patient was located at home.  The provider was located at [facility].   Dorothyann Gibbs, NP   Subjective:   Patient ID:  Deanna George is a 57 y.o. (DOB 1966-03-16) female.  Chief Complaint: No chief complaint on file.   HPI Deanna George presents for follow-up of PTSD, GAD, MDD, insomnia and panic disorder.  Describes mood today as "managing". Pleasant. Tearful at times. Mood symptoms - reports depression, irritability and anxiety. Reports panic attacks. Reports worry, rumination, and over thinking. Reports ongoing situational stressors - health and financial. Reports difficulties completing daily tasks. Reports chronic pain issues and multiple health issues. Reports mood is variable. Stating "I'm trying every day ". Feels like medications continue to be helpful. Continues to work with Dr. Farrel Demark. Varying interest and motivation. Reports journaling and meditating daily.  Taking medications as prescribed.  Energy levels low. Active, does not have a regular exercise routine with physical limitations.   Unable to enjoy usual interests and activities. Single. Lives alone with cat. Has 2 children.  Appetite adequate. Weight loss.  Reports sleeping difficulties - better for the past 2 nights.  Focus and concentration difficulties - ADHD. Completing some tasks. Managing some aspects of household. Disabled. Denies SI or HI.  Denies AH or VH. Denies self harm. Reports smoking THC.  Previous medications: Ritalin and Thorazine - others, Trazadone   Review of Systems:  Review of Systems  Musculoskeletal:  Negative for gait problem.  Neurological:  Negative for tremors.  Psychiatric/Behavioral:         Please refer to HPI    Medications: I have reviewed the patient's current medications.  Current Outpatient Medications  Medication Sig Dispense Refill   albuterol (VENTOLIN HFA) 108 (90 Base) MCG/ACT inhaler Inhale 2 puffs into the lungs every 6 (six) hours as needed for wheezing or shortness of breath. 8 g 2   CANNABIDIOL PO Take by mouth at bedtime.     clonazePAM (KLONOPIN) 1 MG disintegrating tablet Take one tablet four times daily as needed for anxiety and panic attacks. 120 tablet 2   cyclobenzaprine (FLEXERIL) 10 MG tablet TAKE 1 TABLET BY MOUTH EVERY 8 HOURS AS NEEDED FOR 30 DAYS     dicyclomine (BENTYL) 10 MG capsule Take 1 capsule (10 mg total) by mouth 4 (four) times daily -  before meals and at bedtime for 7 days. 28 capsule 0   diphenhydrAMINE (BENADRYL) 25 mg capsule Take 25 mg by mouth every 6 (six) hours as needed  for allergies.     DULoxetine (CYMBALTA) 30 MG capsule Take one capsule daily - tapering up to the 60mg  daily. 30 capsule 0   DULoxetine (CYMBALTA) 60 MG capsule Take 2 capsules (120 mg total) by mouth daily. 60 capsule 5   empagliflozin (JARDIANCE) 25 MG TABS tablet Take 25 mg by mouth daily.     fluticasone (FLONASE) 50 MCG/ACT nasal  spray Place 2 sprays into both nostrils daily for 7 days. 15.8 mL 0   HYDROcodone-acetaminophen (NORCO) 5-325 MG tablet Take 1 tablet by mouth every 6 (six) hours as needed for moderate pain. (Patient not taking: Reported on 03/26/2023) 15 tablet 0   hyoscyamine (LEVSIN SL) 0.125 MG SL tablet Place 0.125 mg under the tongue every 6 (six) hours as needed for cramping.     ibuprofen (ADVIL) 800 MG tablet 1 tablet with food or milk as needed     loperamide (IMODIUM) 2 MG capsule Take 1 capsule (2 mg total) by mouth as needed for diarrhea or loose stools. 30 capsule 0   losartan (COZAAR) 50 MG tablet Take 1 tablet (50 mg total) by mouth in the morning and at bedtime. 60 tablet 11   Melatonin 10 MG CAPS Take 10 mg by mouth at bedtime as needed.      metoprolol tartrate (LOPRESSOR) 25 MG tablet TAKE 1 TABLET BY MOUTH TWICE DAILY. (Patient not taking: Reported on 03/26/2023) 60 tablet 11   OLANZapine (ZYPREXA) 2.5 MG tablet Take 1 tablet (2.5 mg total) by mouth at bedtime. 30 tablet 2   omeprazole (PRILOSEC) 40 MG capsule Take 1 capsule (40 mg total) by mouth in the morning and at bedtime. 60 capsule 0   ondansetron (ZOFRAN) 4 MG tablet Take 1 tablet (4 mg total) by mouth every 6 (six) hours as needed for nausea or vomiting. 30 tablet 11   ondansetron (ZOFRAN) 4 MG tablet Take 1 tablet (4 mg total) by mouth every 6 (six) hours as needed for nausea. 20 tablet 0   prazosin (MINIPRESS) 5 MG capsule Take 1 capsule (5 mg total) by mouth at bedtime. 30 capsule 5   predniSONE (STERAPRED UNI-PAK 21 TAB) 10 MG (21) TBPK tablet As directed on packaging 1 each 0   promethazine (PHENERGAN) 25 MG tablet Take 1 tablet (25 mg total) by mouth every 6 (six) hours as needed for nausea or vomiting. 30 tablet 11   propranolol (INDERAL) 20 MG tablet Take 1 tablet by mouth 2 (two) times daily.     rizatriptan (MAXALT-MLT) 10 MG disintegrating tablet Take 1 tablet (10 mg total) by mouth as needed. May repeat in 2 hours if needed 15  tablet 4   SUMAtriptan (IMITREX) 50 MG tablet Take 50 mg by mouth every 2 (two) hours as needed for migraine. May repeat in 2 hours if headache persists or recurs.     SUMAtriptan (IMITREX) 6 MG/0.5ML SOLN injection Inject 0.5 mLs (6 mg total) into the skin as needed for migraine. May repeat in 2 hours if headache persists or recurs. 10 vial 4   No current facility-administered medications for this visit.    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    Situational stress    Urinary tract infection     Family History  Adopted: Yes  Problem Relation Age of Onset   Colon cancer Neg Hx    Pancreatic cancer Neg Hx    Stomach cancer Neg Hx    Rectal cancer Neg Hx     Social History   Socioeconomic History   Marital status: Divorced    Spouse name: Not on file   Number of children: 2   Years of education: Not on file   Highest education level: Not on file  Occupational History   Occupation: student    Comment: graduated 2018  Tobacco Use   Smoking status: Some Days    Types: Cigarettes   Smokeless tobacco: Never  Vaping Use   Vaping status: Never Used  Substance and Sexual Activity   Alcohol use: Not Currently    Comment: Rare   Drug use: Not Currently   Sexual activity: Not Currently  Other Topics Concern   Not on file  Social History Narrative   Not on file   Social Determinants of Health   Financial  Resource Strain: High Risk (08/01/2023)   Received from [facility]   Overall Financial Resource Strain (CARDIA)    Difficulty of Paying Living Expenses: Very hard  Food Insecurity: Food Insecurity Present (08/01/2023)   Received from [facility]   Hunger Vital Sign    Worried About Running Out of Food in the Last Year: Often true    Ran Out of Food in the Last Year: Sometimes true  Transportation Needs: Unmet Transportation Needs (08/01/2023)   Received from [facility]   PRAPARE - Transportation    In the past 12 months, has lack of transportation kept you from medical appointments or from getting medications?: Yes    Lack of Transportation (Non-Medical): Yes  Physical Activity: Inactive (08/01/2023)   Received from [facility]   Exercise Vital Sign    Days of Exercise per Week: 0 days    Minutes of Exercise per Session: 0 min  Stress: Stress Concern Present (08/07/2023)   Received from [facility] of Occupational Health - Occupational Stress Questionnaire    Feeling of Stress : Very much  Social Connections: Socially Isolated (08/01/2023)   Received from [facility]   Social Connection and Isolation Panel [NHANES]    Frequency of Communication with Friends and Family: Once a week    Frequency of Social Gatherings with Friends and Family: Never    Attends Religious Services: Never    Database administrator or Organizations: No    Attends Banker Meetings: Never    Marital Status: Divorced  Catering manager Violence: Not At Risk (11/17/2021)   Received from [facility]   Humiliation, Afraid, Rape, and Kick questionnaire    Fear of Current or Ex-Partner: No    Emotionally Abused: No    Physically Abused: No    Sexually Abused: No    Past Medical History, Surgical history, Social history, and Family history were reviewed and  updated as appropriate.   Please see review of systems for further details on the patient's review from today.   Objective:   Physical Exam:  There were no vitals taken for this visit.  Physical Exam Constitutional:      General: She is not in acute distress. Musculoskeletal:        General: No deformity.  Neurological:     Mental Status: She is alert and oriented to person, place, and time.     Cranial Nerves: No dysarthria.     Coordination: Coordination normal.  Psychiatric:        Attention and Perception: Attention and perception normal. She does not perceive auditory or visual hallucinations.        Mood and Affect: Affect is not labile, blunt, angry or inappropriate.        Speech: Speech normal.        Behavior: Behavior normal. Behavior is cooperative.        Thought Content: Thought content normal. Thought content is not paranoid or delusional. Thought content does not include homicidal or suicidal ideation. Thought content does not include homicidal or suicidal plan.        Cognition and Memory: Cognition and memory normal.        Judgment: Judgment normal.     Comments: Insight intact     Lab Review:     Component Value Date/Time   NA 135 05/16/2021 2351   NA 140 03/27/2017 1543   K 4.2 05/16/2021 2351   CL 105 05/16/2021 2351   CO2 20 (L) 05/16/2021 2351   GLUCOSE 134 (H) 05/16/2021 2351   BUN 18 05/16/2021 2351   BUN 6 03/27/2017 1543   CREATININE 1.20 (H) 08/20/2023 1639   CREATININE 0.95 09/28/2017 1847   CALCIUM 9.4 05/16/2021 2351   PROT 7.9 05/17/2021 0314   PROT 7.2 03/27/2017 1543   ALBUMIN 4.3 05/17/2021 0314   ALBUMIN 4.5 03/27/2017 1543   AST 16 05/17/2021 0314   ALT 12 05/17/2021 0314   ALKPHOS 83 05/17/2021 0314   BILITOT 0.6 05/17/2021 0314   BILITOT 0.2 03/27/2017 1543   GFRNONAA >60 05/16/2021 2351   GFRNONAA 70 09/28/2017 1847   GFRAA 82 09/28/2017 1847       Component Value Date/Time   WBC 11.3 (H) 05/16/2021 2351   RBC 4.30  05/16/2021 2351   HGB 11.8 (L) 05/16/2021 2351   HGB 12.5 03/27/2017 1543   HCT 35.1 (L) 05/16/2021 2351   HCT 39.0 03/27/2017 1543   PLT 209 05/16/2021 2351   PLT 248 03/27/2017 1543   MCV 81.6 05/16/2021 2351   MCV 87 03/27/2017 1543   MCH 27.4 05/16/2021 2351   MCHC 33.6 05/16/2021 2351   RDW 14.8 05/16/2021 2351   RDW 13.6 03/27/2017 1543   LYMPHSABS 1.7 10/03/2020 0511   LYMPHSABS 1.7 03/27/2017 1543   MONOABS 0.3 10/03/2020 0511   EOSABS 0.2 10/03/2020 0511   EOSABS 0.1 03/27/2017 1543   BASOSABS 0.0 10/03/2020 0511   BASOSABS 0.0 03/27/2017 1543    No results found for: "POCLITH", "LITHIUM"   No results found for: "PHENYTOIN", "PHENOBARB", "VALPROATE", "CBMZ"   .res Assessment: Plan:    Continue:  Cymbalta 120mg  daily - tapering back up on the dose. Prazosin 5mg  at hs - nightmares - takes 10mg  3 times a week. Clonazepam 1mg  4 times daily - ODT from tablet - took extra for 5 days  Zyprexa 2.5mg  at hs  [facility]  RTC 4 weeks  Patient advised to contact office with any questions, adverse effects, or acute worsening in signs and symptoms.  Discussed potential benefits, risk, and side effects of benzodiazepines to include potential risk of tolerance and dependence, as well as possible drowsiness.  Advised patient not to drive if experiencing drowsiness and to take lowest possible effective dose to minimize risk of dependence and tolerance.  Discussed potential benefits, risks, and side effects of stimulants with patient to include increased heart rate, palpitations, insomnia, increased anxiety, increased irritability, or decreased appetite.  Instructed patient to contact office if experiencing any significant tolerability issues.  There are no diagnoses linked to this encounter.  Please see After Visit Summary for patient specific instructions.  Future Appointments  Date Time Provider Department Center  10/18/2023  1:00 PM Robley Fries,  PhD [facility] None  10/23/2023  3:00 PM Robley Fries, PhD [facility] None    No orders of the defined types were placed in this encounter.     -------------------------------

## 2023-10-18 ENCOUNTER — Ambulatory Visit: Payer: Medicaid Other | Admitting: Psychiatry

## 2023-10-18 DIAGNOSIS — Z62820 Parent-biological child conflict: Secondary | ICD-10-CM

## 2023-10-18 DIAGNOSIS — F4312 Post-traumatic stress disorder, chronic: Secondary | ICD-10-CM | POA: Diagnosis not present

## 2023-10-18 DIAGNOSIS — F5104 Psychophysiologic insomnia: Secondary | ICD-10-CM | POA: Diagnosis not present

## 2023-10-18 DIAGNOSIS — F331 Major depressive disorder, recurrent, moderate: Secondary | ICD-10-CM | POA: Diagnosis not present

## 2023-10-18 DIAGNOSIS — Z599 Problem related to housing and economic circumstances, unspecified: Secondary | ICD-10-CM

## 2023-10-18 DIAGNOSIS — F411 Generalized anxiety disorder: Secondary | ICD-10-CM

## 2023-10-18 DIAGNOSIS — G8929 Other chronic pain: Secondary | ICD-10-CM

## 2023-10-18 DIAGNOSIS — F909 Attention-deficit hyperactivity disorder, unspecified type: Secondary | ICD-10-CM

## 2023-10-18 DIAGNOSIS — F447 Conversion disorder with mixed symptom presentation: Secondary | ICD-10-CM

## 2023-10-18 NOTE — Progress Notes (Signed)
 Psychotherapy Progress Note [facility] [name], PhD LP  Patient ID: Deanna George)    MRN: [id number] Therapy format: Individual psychotherapy Date: 10/18/2023      Start: 1:14p     Stop: 2:15p     Time Spent: 61 min Location: Telehealth visit -- I connected with this patient by an approved telecommunication method (video), with her informed consent, and verifying identity and patient privacy.  I was located at my office and patient at her home.  As needed, we discussed the limitations, risks, and security and privacy concerns associated with telehealth service, including the availability and conditions which currently govern in-person appointments and the possibility that 3rd-party payment may not be fully guaranteed and she may be responsible for charges.  After she indicated understanding, we proceeded with the session.  Also discussed treatment planning, as needed, including ongoing verbal agreement with the plan, the opportunity to ask and answer all questions, her demonstrated understanding of instructions, and her readiness to call the office should symptoms worsen or she feels she is in a crisis state and needs more immediate and tangible assistance.   Session narrative (presenting needs, interim history, self-report of stressors and symptoms, applications of prior therapy, status changes, and interventions made in session) Started Zyprexa , as noted 3 weeks ago, which worked well, but after about 5 or 6 nights seemed to quit working.  Last night took her qhs clonazepam , a shot of moonshine, CBD, 5mg  prazosin , Zyprexa  2.5mg , all staggered around 30-45 min apart, give or take, up till 3:30am or so.  Typically eats a little to help settle her stomach and go through some subset of her meds in about a 2-2.5 hr time frame.  Most typical to go with clonazepam , CBD, and 5mg  prazosin , but notably tends to make many extra decisions about what to take and how much, and she is  running into intermittent problems affording RFs.   Confronted the likely issue of too much decision-making in addition to too much chemistry, all combining to perpetuate anxiety rather than relieve it for sleep.  Reinterpreted her powerful, trauma-backed insomnia response, and encouraged her to decidedly give in to sleep.  Agreed she may also seek medical advice with psychiatry, but certainly should regulate her night regimen, to make it more the same constellation each night for a while, for one, decide some things as conscious relaxers preparing for sleep, others as sleepy "makers" to be taken together rather than her "wait and see" approach, so as to give herself relief from the paradox of having to be so vigilant to assess things.  With extensive discussion, agreed she should try maintaining scheduled clonazepam , apply CBD for pain and relaxation, not sleep, ensure she has 5mg  prazosin , then add 1-2 of her 2.5mg  Zyprexa  at the same time, depending.  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  Found him patient and kind, and informative, reassuring that she does not have Parkinson's.  Found it covered very well her constellation of symptoms, including what we call "brownouts", fibromyalgia, and it does actually affect myelin.  Has advice to go up on propranolol for her tremors, involve with [facility] pain clinic, engage biofeedback, and continue to work on stress management in therapy.  Very validated and more hopeful.    Re. Deanna George, has seen her a couple times now and it's going great.  Alone for TG, bore that just fine.  Enthused to be asked to go to Buddhist temple together, which turned out to be a warm and beautiful experience.  Did well to practice the pregnant pause, "OK", instead of going probing her life unwelcome or overexplain things.  Even when Deanna George cautioned her how she wanted to just tell some things without Deanna George  catastrophizing, allowed her to do so.  Finding herself able to ask for details about plans without triggering irritability.  And able to let Deanna George make the call how much to reveal about herself and how much to go interact with people who may be judgmental, like a fundamentalist.  Affirmed and encouraged applying the low-needs, low-demand approach.  Finding more that Deanna George can manage her own plans and boundaries.  Got out to favorite movie together as well.  Affirmed and encouraged in normalizing relations and retiring worry about her flying hostile or alienating her.    Therapeutic modalities: Cognitive Behavioral Therapy, Solution-Oriented/Positive Psychology, Ego-Supportive, Assertiveness/Communication, and Interpersonal  Mental Status/Observations:  Appearance:   Casual     Behavior:  Appropriate  Motor:  Normal  Speech/Language:   Clear and Coherent  Affect:  Appropriate  Mood:  anxious, depressed, and improving exc sleep loss  Thought process:  normal  Thought content:    Rumination  Sensory/Perceptual disturbances:    WNL  Orientation:  Fully oriented  Attention:  Good    Concentration:  Fair  Memory:  WNL  Insight:    Variable  Judgment:   Variable  Impulse Control:  Good   Risk Assessment: Danger to Self: No Self-injurious Behavior: No Danger to Others: No Physical Aggression / Violence: No Duty to Warn: No Access to Firearms a concern: No  Assessment of progress:  progressing  Diagnosis:   ICD-10-CM   1. Chronic post-traumatic stress disorder (PTSD)  F43.12     2. Generalized anxiety disorder  F41.1     3. Major depressive disorder, recurrent episode, moderate (HCC)  F33.1     4. 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 Attention deficit hyperactivity disorder (ADHD), unspecified ADHD type  F90.9     6. Functional neurological symptom disorder with mixed symptoms  F44.7    dx by [facility] neurology    7. Relationship problem between parent and  child  Z62.820     8. Financial difficulties  Z59.9     9. Other chronic pain - multiple causes  G89.29      Plan:  Safety plan -- Ongoing pledge of no harm, will use crisis call service if unable to guarantee safety.  Recognize SI as a way of getting her own attention on feeling overwhelmed and prioritize actions to mitigate stress or find support.  Ongoing availability on wait list for earlier appts. Anxiety/panic management and sleep -- Pursue/maintain appropriate sleep medication, refrain from up-dosing and stacking because it just won't work, it will only tease her into being more anxious.  Practice allowing sleep and consciously settling for doing all she can today but rest for the next.  As needed, self-affirm her disability inquiry is only to direct next actions updating her claim, not a prelude to putting her on the street, and if further info is needed, we will address it in turn.  Where possible, continue to practice meditation and calming skills at interest, to proficiency in reducing experienced distress.   Relationship with Deanna George -- Pay close attention to open, trusting, revealing behavior and take heart.  Actively dispute catastrophic ideas of  what will happen to Deanna George, or their relationship, and recognize PTSD imagination in progress, dispute.  Reaffirm it is developmentally Deanna George's business to work out for herself what her boundaries will be with peers and father.  Practice trust that Deanna George will use her independent mind to figure it out without wrecking her life or taking [name]'s perceived bait in the process, and that she will just as vigorously defend herself with anyone else as she does her mother if she sees the need, including her father and her counselor.  Take any harsh utterances with as much salt as possible, because they are viciously stated for effects, not facts.  OK to confront rudeness if it comes, but may be more worth it to let it pass with grace and focus on controlling  her own tendencies to catastrophize and overtalk.  Advocate timeout agreement, prepared when calm, if things turns harsh.  Ongoing advice to firmly refuse impulses to irritating behaviors like overexplaining, overworry, over-probing, or trying in any way to lobby Deanna George's choices without first having her explicit consent to debate/dispute or challenge. FOO issues -- May consult members of FOO and adoptive FOO at will, also OK not to press it.  Monitor for inadvertently alarmist advice and be willing to tamp down.  Explore at discretion only if cogent and careful enough to avoid being triggered to paranoia and compulsive agreement with wild ideas.  Maintain authority to decide how to approach it and when to refuse torturous boundaries.  Stay very realistic about expecting maturity from birth mother and her nominal "sisters", who all have their own problems and blind spots, and actively dispute any idea that she is rejected or indicted by people who honestly don't know her well.  Take all family advice with skepticism, stay ready to recognize when urgency to help will jump to conclusions and make hasty judgments.  Stay ready to write off bad reactions as either too agenda-driven or just too ignorant about her to see the better.   Health -- Keep up with recommended referrals and assessments, treatments for pain and kidney care.  Recommend common vitamin supplements if able for neurological health, including B complex and D.  Advice against smoking and sodas for general health and kidney protection. Poverty -- Continue to endorse SSDI as she is unable to hold any gainful employment with a slew of medical and emotional conditions.  Financial counseling as available, notice and flag any overly expensive items.  Ongoing encouragement to use, and ask about, patient resources, Medicaid and Medicare privileges, and utilize social work rather than make assumptions. Other recommendations/advice -- As may be noted above.   Continue to utilize previously learned skills ad lib. Medication compliance -- Maintain medication as prescribed and work faithfully with relevant prescriber(s) if any changes are desired or seem indicated. Crisis service -- Aware of call list and work-in appts.  Call the clinic on-call service, 988/hotline, 911, or present to [facility] or ER if any life-threatening psychiatric crisis. Followup -- Return for time as already scheduled.  Next scheduled visit with me 10/23/2023.  Next scheduled in this office 10/23/2023.  Maretta Shaper, PhD [name], PhD LP Clinical Psychologist, [facility] [address], [address] [place], [place] [address] [phone]

## 2023-10-19 ENCOUNTER — Other Ambulatory Visit: Payer: Medicaid Other

## 2023-10-23 ENCOUNTER — Ambulatory Visit (INDEPENDENT_AMBULATORY_CARE_PROVIDER_SITE_OTHER): Payer: Medicaid Other | Admitting: Psychiatry

## 2023-10-23 DIAGNOSIS — F331 Major depressive disorder, recurrent, moderate: Secondary | ICD-10-CM

## 2023-10-23 DIAGNOSIS — Z62820 Parent-biological child conflict: Secondary | ICD-10-CM

## 2023-10-23 DIAGNOSIS — F4312 Post-traumatic stress disorder, chronic: Secondary | ICD-10-CM

## 2023-10-23 DIAGNOSIS — F909 Attention-deficit hyperactivity disorder, unspecified type: Secondary | ICD-10-CM

## 2023-10-23 DIAGNOSIS — G8929 Other chronic pain: Secondary | ICD-10-CM

## 2023-10-23 DIAGNOSIS — F447 Conversion disorder with mixed symptom presentation: Secondary | ICD-10-CM

## 2023-10-23 DIAGNOSIS — F5104 Psychophysiologic insomnia: Secondary | ICD-10-CM

## 2023-10-23 DIAGNOSIS — Z599 Problem related to housing and economic circumstances, unspecified: Secondary | ICD-10-CM

## 2023-10-23 NOTE — Progress Notes (Signed)
 Psychotherapy Progress Note [facility] Deanna Ferry, PhD LP  Patient ID: Deanna George)    MRN: [id number] Therapy format: Individual psychotherapy Date: 10/23/2023      Start: 3:17p     Stop: 4:14p     Time Spent: 57 min Location: Telehealth visit -- I connected with this patient by an approved telecommunication method (video), with her informed consent, and verifying identity and patient privacy.  I was located at my office and patient at her home.  As needed, we discussed the limitations, risks, and security and privacy concerns associated with telehealth service, including the availability and conditions which currently govern in-person appointments and the possibility that 3rd-party payment may not be fully guaranteed and she may be responsible for charges.  After she indicated understanding, we proceeded with the session.  Also discussed treatment planning, as needed, including ongoing verbal agreement with the plan, the opportunity to ask and answer all questions, her demonstrated understanding of instructions, and her readiness to call the office should symptoms worsen or she feels she is in a crisis state and needs more immediate and tangible assistance.   Session narrative (presenting needs, interim history, self-report of stressors and symptoms, applications of prior therapy, status changes, and interventions made in session) In better mood overall, surely, though fibro is really flaring up today, along with amy number of old injury sites.    Good news in that Summer has spontaneously mentioned thinking about changing counselors.  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  Other good news, having attended a Buddhist temple, getting referred to a somewhat older couple she has a lot in common with, who are in a generous mood, helped her  with money and gave her a couple crystals, being crystal lovers themselves.  Great relief, having gotten down to $1.26 in her pocket last week.  Knows she has a friend among the women who implicitly understands, given her own hardships in life.  Sees it as Universe telling her to hang on, she's actually loved and accepted.  Grateful to the priest for connecting them.  Sees answered prayer, beginning to acquire a "tribe" in life.  Not rushing, but also feeling the hunger to eventually land a relationship.    Sleep has gone very well last couple days, partly for narrowing down her bedtime to 11/11:15.  Has tried Zyprexa  2.5mg  1.5 tabs to good effect.  Found she could sleep very soundly at that level and then go right back to sleep after getting up for the cat.  Not waking up with any full-on anxiety attacks since beginning Zyprexa , thankfully.  Making good use of the slogan, "I don't do homework in bed" to answer intrusive worry thoughts.  Also has made sense to her the idea that her "powerful" brain can successfully fight strong medication like it would fight abuse and other hostile control, and trying to give in better.  Noticed that psychiatric service is unscheduled so far.  With her consent passing on word to her NP what's working for her now.    Therapeutic modalities: Cognitive Behavioral Therapy, Solution-Oriented/Positive Psychology, and Ego-Supportive  Mental Status/Observations:  Appearance:   Casual     Behavior:  Appropriate  Motor:  Normal  Speech/Language:   Clear and Coherent  Affect:  Appropriate  Mood:  anxious and less depressed  Thought process:  normal  Thought content:    Rumination  Sensory/Perceptual disturbances:    WNL  Orientation:  Fully oriented  Attention:  Good    Concentration:  Fair  Memory:  WNL  Insight:    Variable  Judgment:   Good  Impulse Control:  Good   Risk Assessment: Danger to Self: No Self-injurious Behavior: No Danger to Others: No Physical  Aggression / Violence: No Duty to Warn: No Access to Firearms a concern: No  Assessment of progress:  progressing  Diagnosis:   ICD-10-CM   1. Chronic post-traumatic stress disorder (PTSD)  F43.12     2. Major depressive disorder, recurrent episode, moderate (HCC)  F33.1     3. 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 Attention deficit hyperactivity disorder (ADHD), unspecified ADHD type  F90.9     5. Other chronic pain - multiple causes incl fibromyalgia  G89.29     6. Functional neurological symptom disorder with mixed symptoms  F44.7     7. Relationship problem between parent and child  Z62.820     8. Financial difficulties  Z59.9      Plan:  Safety plan -- Ongoing pledge of no harm, will use crisis call service if unable to guarantee safety.  Recognize SI as a way of getting her own attention on feeling overwhelmed and prioritize actions to mitigate stress or find support.  Ongoing availability on wait list for earlier appts. Anxiety/panic management and sleep -- Pursue/maintain appropriate sleep medication, refrain from up-dosing and stacking because it just won't work, it will only tease her into being more anxious.  Practice allowing sleep and consciously settling for doing all she can today but rest for the next.  As needed, self-affirm her disability inquiry is only to direct next actions updating her claim, not a prelude to putting her on the street, and if further info is needed, we will address it in turn.  Where possible, continue to practice meditation and calming skills at interest, to proficiency in reducing experienced distress.   Relationship with Summer -- Pay close attention to open, trusting, revealing behavior and take heart.  Actively dispute catastrophic ideas of what will happen to Summer, or their relationship, and recognize PTSD imagination in progress, dispute.  Reaffirm it is developmentally Summer's business to work out for herself what  her boundaries will be with peers and father.  Practice trust that Summer will use her independent mind to figure it out without wrecking her life or taking [name]'s perceived bait in the process, and that she will just as vigorously defend herself with anyone else as she does her mother if she sees the need, including her father and her counselor.  Take any harsh utterances with as much salt as possible, because they are viciously stated for effects, not facts.  OK to confront rudeness if it comes, but may be more worth it to let it pass with grace and focus on controlling her own tendencies to catastrophize and overtalk.  Advocate timeout agreement, prepared when calm, if things turns harsh.  Ongoing advice to firmly refuse impulses to irritating behaviors like overexplaining, overworry, over-probing, or trying in any way to lobby Summer's choices without first having her explicit consent to debate/dispute or challenge. FOO issues -- May consult members of FOO and adoptive FOO at will, also OK not to press it.  Monitor for inadvertently alarmist advice and be willing to tamp down.  Explore at discretion only if cogent  and careful enough to avoid being triggered to paranoia and compulsive agreement with wild ideas.  Maintain authority to decide how to approach it and when to refuse torturous boundaries.  Stay very realistic about expecting maturity from birth mother and her nominal "sisters", who all have their own problems and blind spots, and actively dispute any idea that she is rejected or indicted by people who honestly don't know her well.  Take all family advice with skepticism, stay ready to recognize when urgency to help will jump to conclusions and make hasty judgments.  Stay ready to write off bad reactions as either too agenda-driven or just too ignorant about her to see the better.   Health -- Keep up with recommended referrals and assessments, treatments for pain and kidney care.  Recommend common  vitamin supplements if able for neurological health, including B complex and D.  Advice against smoking and sodas for general health and kidney protection. Poverty -- Continue to endorse SSDI as she is unable to hold any gainful employment with a slew of medical and emotional conditions.  Financial counseling as available, notice and flag any overly expensive items.  Ongoing encouragement to use, and ask about, patient resources, Medicaid and Medicare privileges, and utilize social work rather than make assumptions. Other recommendations/advice -- As may be noted above.  Continue to utilize previously learned skills ad lib. Medication compliance -- Maintain medication as prescribed and work faithfully with relevant prescriber(s) if any changes are desired or seem indicated. Crisis service -- Aware of call list and work-in appts.  Call the clinic on-call service, 988/hotline, 911, or present to [facility] or ER if any life-threatening psychiatric crisis. Followup -- Return for time as already scheduled, needs reschedule with prescriber.  Next scheduled visit with me 12/05/2023.  Next scheduled in this office 12/05/2023.  Maretta Shaper, PhD Deanna Ferry, PhD LP Clinical Psychologist, [facility] [address] [place], [place] [address] [phone]

## 2023-11-21 ENCOUNTER — Ambulatory Visit: Payer: Medicaid Other | Admitting: Dietician

## 2023-11-26 ENCOUNTER — Other Ambulatory Visit: Payer: Self-pay

## 2023-11-26 ENCOUNTER — Telehealth: Payer: Self-pay | Admitting: Adult Health

## 2023-11-26 ENCOUNTER — Ambulatory Visit: Payer: Medicaid Other | Admitting: Psychiatry

## 2023-11-26 DIAGNOSIS — F447 Conversion disorder with mixed symptom presentation: Secondary | ICD-10-CM

## 2023-11-26 DIAGNOSIS — F5104 Psychophysiologic insomnia: Secondary | ICD-10-CM | POA: Diagnosis not present

## 2023-11-26 DIAGNOSIS — F411 Generalized anxiety disorder: Secondary | ICD-10-CM

## 2023-11-26 DIAGNOSIS — G8929 Other chronic pain: Secondary | ICD-10-CM

## 2023-11-26 DIAGNOSIS — Z62820 Parent-biological child conflict: Secondary | ICD-10-CM

## 2023-11-26 DIAGNOSIS — F331 Major depressive disorder, recurrent, moderate: Secondary | ICD-10-CM | POA: Diagnosis not present

## 2023-11-26 DIAGNOSIS — F4312 Post-traumatic stress disorder, chronic: Secondary | ICD-10-CM | POA: Diagnosis not present

## 2023-11-26 DIAGNOSIS — F909 Attention-deficit hyperactivity disorder, unspecified type: Secondary | ICD-10-CM

## 2023-11-26 DIAGNOSIS — Z599 Problem related to housing and economic circumstances, unspecified: Secondary | ICD-10-CM

## 2023-11-26 MED ORDER — CLONAZEPAM 1 MG PO TABS: 1.0000 mg | ORAL_TABLET | Freq: Four times a day (QID) | ORAL | 0 refills | Status: DC | PRN

## 2023-11-26 NOTE — Telephone Encounter (Signed)
 Deanna George called at 11:33 to report that she has been trying for several days to get her Clonazepam  filled but no one has it.  She has tried [facility], Therapist, Occupational and her Financial Planner and it is on backorder and she doesn't know when she will be able to get it.  She is entering a cluster attack and really needs something to ward this off.  Please call in an alternate medication.  Appt 1/23. [facility] to [facility] on [address], [employer]
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
## 2023-11-26 NOTE — Telephone Encounter (Signed)
 Pended regular tablets of clonazepam 1 mg QID prn. Pharmacist said they could be crushed.

## 2023-11-26 NOTE — Telephone Encounter (Signed)
 Notified Mardelle Matte of this and he will relay info to patient during appt.

## 2023-11-26 NOTE — Telephone Encounter (Signed)
 Patient reporting she is unable to find her 1 mg clonazepam  ODT in stock. I checked with the [facility] outpatient pharmacies and [facility] has a few in stock.  She said she has been out of medication for 6 days. She has an urgent appt with Jodie today. She is asking for another medication in the place of the clonazepam .

## 2023-11-26 NOTE — Telephone Encounter (Signed)
 She said she has checked with [facility], [facility], [facility]. I called [facility], [facility], and [facility].  Can she crush regular tablets and take in yogurt or applesauce?

## 2023-12-05 ENCOUNTER — Ambulatory Visit: Payer: Medicaid Other | Admitting: Psychiatry

## 2023-12-05 DIAGNOSIS — M797 Fibromyalgia: Secondary | ICD-10-CM

## 2023-12-05 DIAGNOSIS — Z599 Problem related to housing and economic circumstances, unspecified: Secondary | ICD-10-CM

## 2023-12-05 DIAGNOSIS — F4312 Post-traumatic stress disorder, chronic: Secondary | ICD-10-CM

## 2023-12-05 DIAGNOSIS — F411 Generalized anxiety disorder: Secondary | ICD-10-CM

## 2023-12-05 DIAGNOSIS — F331 Major depressive disorder, recurrent, moderate: Secondary | ICD-10-CM | POA: Diagnosis not present

## 2023-12-05 DIAGNOSIS — F5104 Psychophysiologic insomnia: Secondary | ICD-10-CM

## 2023-12-05 DIAGNOSIS — Z62820 Parent-biological child conflict: Secondary | ICD-10-CM

## 2023-12-05 DIAGNOSIS — F909 Attention-deficit hyperactivity disorder, unspecified type: Secondary | ICD-10-CM

## 2023-12-05 DIAGNOSIS — G8929 Other chronic pain: Secondary | ICD-10-CM
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
## 2023-12-06 ENCOUNTER — Encounter: Payer: Self-pay | Admitting: Adult Health

## 2023-12-06 ENCOUNTER — Telehealth: Payer: Medicaid Other | Admitting: Adult Health

## 2023-12-06 DIAGNOSIS — F410 Panic disorder [episodic paroxysmal anxiety] without agoraphobia: Secondary | ICD-10-CM

## 2023-12-06 DIAGNOSIS — F411 Generalized anxiety disorder: Secondary | ICD-10-CM | POA: Diagnosis not present

## 2023-12-06 DIAGNOSIS — F331 Major depressive disorder, recurrent, moderate: Secondary | ICD-10-CM

## 2023-12-06 DIAGNOSIS — F4312 Post-traumatic stress disorder, chronic: Secondary | ICD-10-CM | POA: Diagnosis not present

## 2023-12-06 DIAGNOSIS — G4700 Insomnia, unspecified: Secondary | ICD-10-CM | POA: Diagnosis not present

## 2023-12-06 MED ORDER — OLANZAPINE 2.5 MG PO TABS: 2.5000 mg | ORAL_TABLET | Freq: Every day | ORAL | 2 refills | Status: DC

## 2023-12-06 MED ORDER — CLONAZEPAM 1 MG PO TABS: 1.0000 mg | ORAL_TABLET | Freq: Four times a day (QID) | ORAL | 2 refills | Status: DC | PRN

## 2023-12-06 NOTE — Progress Notes (Signed)
Deanna George [id number] [birth year] 58 y.o.  Virtual Visit via Video Note  I connected with pt @ on 12/06/23 at  3:30 PM EST by a video enabled telemedicine application and verified that I am speaking with the correct person using two identifiers.   I discussed the limitations of evaluation and management by telemedicine and the availability of in person appointments. The patient expressed understanding and agreed to proceed.  I discussed the assessment and treatment plan with the patient. The patient was provided an opportunity to ask questions and all were answered. The patient agreed with the plan and demonstrated an understanding of the instructions.   The patient was advised to call back or seek an in-person evaluation if the symptoms worsen or if the condition fails to improve as anticipated.  I provided 30 minutes of non-face-to-face time during this encounter.  The patient was located at home.  The provider was located at [facility].   Dorothyann Gibbs, NP   Subjective:   Patient ID:  Deanna George is a 58 y.o. (DOB 1966-06-27) female.  Chief Complaint: No chief complaint on file.   HPI BULEAH EFFRON presents for follow-up of PTSD, GAD, MDD, insomnia and panic disorder.  Describes mood today as "about the same". Pleasant. Tearful at times. Mood symptoms - reports depression, irritability and anxiety. Reports low interest and motivation. Reports panic attacks. Reports worry, rumination, and over thinking. Reports ongoing situational stressors - health and financial. Reports difficulties completing daily tasks. Reports chronic pain issues and multiple health issues. Reports mood is variable. Stating "I feel like every day is a guessing game as to what I can and can't do". Feels like medications continue to be helpful. Continues to work with Dr. Farrel Demark.  Taking medications as prescribed.  Energy levels lower. Active, does not have a regular exercise routine with physical  limitations.   Unable to enjoy usual interests and activities. Single. Lives alone with cat and 3 birds. Has 2 children.  Appetite adequate - poor. Reports weight loss. Current weight - 170 pounds. Reports sleeping difficulties. Averages 4 hours most nights. Focus and concentration difficulties - severe ADHD. Completing some tasks. Managing some aspects of household. Disabled. Denies SI or HI.  Denies AH or VH. Denies self harm. Reports smoking THC daily - OTC - nausea and overall pain.  Previous medications: Ritalin and Thorazine - others, Trazadone    Review of Systems:  Review of Systems  Musculoskeletal:  Negative for gait problem.  Neurological:  Negative for tremors.  Psychiatric/Behavioral:         Please refer to HPI    Medications: I have reviewed the patient's current medications.  Current Outpatient Medications  Medication Sig Dispense Refill   albuterol (VENTOLIN HFA) 108 (90 Base) MCG/ACT inhaler Inhale 2 puffs into the lungs every 6 (six) hours as needed for wheezing or shortness of breath. 8 g 2   CANNABIDIOL PO Take by mouth at bedtime.     clonazePAM (KLONOPIN) 1 MG disintegrating tablet Take one tablet four times daily as needed for anxiety and panic attacks. 120 tablet 2   clonazePAM (KLONOPIN) 1 MG tablet Take 1 tablet (1 mg total) by mouth 4 (four) times daily as needed for anxiety. 120 tablet 0   cyclobenzaprine (FLEXERIL) 10 MG tablet TAKE 1 TABLET BY MOUTH EVERY 8 HOURS AS NEEDED FOR 30 DAYS     dicyclomine (BENTYL) 10 MG capsule Take 1 capsule (10 mg total) by mouth 4 (four) times  daily -  before meals and at bedtime for 7 days. 28 capsule 0   diphenhydrAMINE (BENADRYL) 25 mg capsule Take 25 mg by mouth every 6 (six) hours as needed for allergies.     DULoxetine (CYMBALTA) 30 MG capsule Take one capsule daily - tapering up to the 60mg  daily. 30 capsule 0   DULoxetine (CYMBALTA) 60 MG capsule Take 2 capsules (120 mg total) by mouth daily. 60 capsule 5    empagliflozin (JARDIANCE) 25 MG TABS tablet Take 25 mg by mouth daily.     fluticasone (FLONASE) 50 MCG/ACT nasal spray Place 2 sprays into both nostrils daily for 7 days. 15.8 mL 0   HYDROcodone-acetaminophen (NORCO) 5-325 MG tablet Take 1 tablet by mouth every 6 (six) hours as needed for moderate pain. (Patient not taking: Reported on 03/26/2023) 15 tablet 0   hyoscyamine (LEVSIN SL) 0.125 MG SL tablet Place 0.125 mg under the tongue every 6 (six) hours as needed for cramping.     ibuprofen (ADVIL) 800 MG tablet 1 tablet with food or milk as needed     loperamide (IMODIUM) 2 MG capsule Take 1 capsule (2 mg total) by mouth as needed for diarrhea or loose stools. 30 capsule 0   losartan (COZAAR) 50 MG tablet Take 1 tablet (50 mg total) by mouth in the morning and at bedtime. 60 tablet 11   Melatonin 10 MG CAPS Take 10 mg by mouth at bedtime as needed.      metoprolol tartrate (LOPRESSOR) 25 MG tablet TAKE 1 TABLET BY MOUTH TWICE DAILY. (Patient not taking: Reported on 03/26/2023) 60 tablet 11   OLANZapine (ZYPREXA) 2.5 MG tablet Take 1 tablet (2.5 mg total) by mouth at bedtime. 30 tablet 2   omeprazole (PRILOSEC) 40 MG capsule Take 1 capsule (40 mg total) by mouth in the morning and at bedtime. 60 capsule 0   ondansetron (ZOFRAN) 4 MG tablet Take 1 tablet (4 mg total) by mouth every 6 (six) hours as needed for nausea or vomiting. 30 tablet 11   ondansetron (ZOFRAN) 4 MG tablet Take 1 tablet (4 mg total) by mouth every 6 (six) hours as needed for nausea. 20 tablet 0   prazosin (MINIPRESS) 5 MG capsule Take 1 capsule (5 mg total) by mouth at bedtime. 30 capsule 5   predniSONE (STERAPRED UNI-PAK 21 TAB) 10 MG (21) TBPK tablet As directed on packaging 1 each 0   promethazine (PHENERGAN) 25 MG tablet Take 1 tablet (25 mg total) by mouth every 6 (six) hours as needed for nausea or vomiting. 30 tablet 11   propranolol (INDERAL) 20 MG tablet Take 1 tablet by mouth 2 (two) times daily.     rizatriptan  (MAXALT-MLT) 10 MG disintegrating tablet Take 1 tablet (10 mg total) by mouth as needed. May repeat in 2 hours if needed 15 tablet 4   SUMAtriptan (IMITREX) 50 MG tablet Take 50 mg by mouth every 2 (two) hours as needed for migraine. May repeat in 2 hours if headache persists or recurs.     SUMAtriptan (IMITREX) 6 MG/0.5ML SOLN injection Inject 0.5 mLs (6 mg total) into the skin as needed for migraine. May repeat in 2 hours if headache persists or recurs. 10 vial 4   No current facility-administered medications for this visit.    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    Situational stress    Urinary tract infection     Family History  Adopted: Yes  Problem Relation Age of Onset   Colon cancer Neg Hx    Pancreatic cancer Neg Hx    Stomach cancer Neg Hx    Rectal cancer Neg Hx     Social History   Socioeconomic History   Marital status: Divorced    Spouse name: Not on file   Number of children: 2   Years of education: Not on file   Highest education level: Not on file  Occupational History   Occupation: student    Comment: graduated 2018  Tobacco Use   Smoking status: Some Days    Types: Cigarettes   Smokeless tobacco: Never  Vaping Use   Vaping status: Never Used  Substance and Sexual Activity   Alcohol use: Not Currently    Comment: Rare   Drug use: Not Currently   Sexual activity: Not Currently   Other Topics Concern   Not on file  Social History Narrative   Not on file   Social Drivers of Health   Financial Resource Strain: High Risk (08/01/2023)   Received from [facility]   Overall Financial Resource Strain (CARDIA)    Difficulty of Paying Living Expenses: Very hard  Food Insecurity: Food Insecurity Present (08/01/2023)   Received [facility]   Hunger Vital Sign    Worried About Running Out of Food in the Last Year: Often true    Ran Out of Food in the Last Year: Sometimes true  Transportation Needs: Unmet Transportation Needs (08/01/2023)   Received from [facility]   PRAPARE - Transportation    In the past 12 months, has lack of transportation kept you from medical appointments or from getting medications?: Yes    Lack of Transportation (Non-Medical): Yes  Physical Activity: Inactive (08/01/2023)   Received from [facility]   Exercise Vital Sign    Days of Exercise per Week: 0 days    Minutes of Exercise per Session: 0 min  Stress: Stress Concern Present (08/07/2023)   Received from [facility] - Occupational Stress Questionnaire    Feeling of Stress : Very much  Social Connections: Socially Isolated (08/01/2023)   Received from [facility]   Social Connection and Isolation Panel [NHANES]    Frequency of Communication with Friends and Family: Once a week    Frequency of Social Gatherings with Friends and Family: Never    Attends Religious Services: Never    Database administrator or Organizations: No    Attends Banker Meetings: Never    Marital Status: Divorced  Catering manager Violence: Not At Risk (11/17/2021)   Received from [facility]   Humiliation, Afraid, Rape, and Kick questionnaire    Fear of Current or Ex-Partner: No  Emotionally Abused: No    Physically Abused: No     Sexually Abused: No    Past Medical History, Surgical history, Social history, and Family history were reviewed and updated as appropriate.   Please see review of systems for further details on the patient's review from today.   Objective:   Physical Exam:  There were no vitals taken for this visit.  Physical Exam Constitutional:      General: She is not in acute distress. Musculoskeletal:        General: No deformity.  Neurological:     Mental Status: She is alert and oriented to person, place, and time.     Coordination: Coordination normal.  Psychiatric:        Attention and Perception: Attention and perception normal. She does not perceive auditory or visual hallucinations.        Mood and Affect: Affect is not labile, blunt, angry or inappropriate.        Speech: Speech normal.        Behavior: Behavior normal.        Thought Content: Thought content normal. Thought content is not paranoid or delusional. Thought content does not include homicidal or suicidal ideation. Thought content does not include homicidal or suicidal plan.        Cognition and Memory: Cognition and memory normal.        Judgment: Judgment normal.     Comments: Insight intact     Lab Review:     Component Value Date/Time   NA 135 05/16/2021 2351   NA 140 03/27/2017 1543   K 4.2 05/16/2021 2351   CL 105 05/16/2021 2351   CO2 20 (L) 05/16/2021 2351   GLUCOSE 134 (H) 05/16/2021 2351   BUN 18 05/16/2021 2351   BUN 6 03/27/2017 1543   CREATININE 1.20 (H) 08/20/2023 1639   CREATININE 0.95 09/28/2017 1847   CALCIUM 9.4 05/16/2021 2351   PROT 7.9 05/17/2021 0314   PROT 7.2 03/27/2017 1543   ALBUMIN 4.3 05/17/2021 0314   ALBUMIN 4.5 03/27/2017 1543   AST 16 05/17/2021 0314   ALT 12 05/17/2021 0314   ALKPHOS 83 05/17/2021 0314   BILITOT 0.6 05/17/2021 0314   BILITOT 0.2 03/27/2017 1543   GFRNONAA >60 05/16/2021 2351   GFRNONAA 70 09/28/2017 1847   GFRAA 82 09/28/2017 1847        Component Value Date/Time   WBC 11.3 (H) 05/16/2021 2351   RBC 4.30 05/16/2021 2351   HGB 11.8 (L) 05/16/2021 2351   HGB 12.5 03/27/2017 1543   HCT 35.1 (L) 05/16/2021 2351   HCT 39.0 03/27/2017 1543   PLT 209 05/16/2021 2351   PLT 248 03/27/2017 1543   MCV 81.6 05/16/2021 2351   MCV 87 03/27/2017 1543   MCH 27.4 05/16/2021 2351   MCHC 33.6 05/16/2021 2351   RDW 14.8 05/16/2021 2351   RDW 13.6 03/27/2017 1543   LYMPHSABS 1.7 10/03/2020 0511   LYMPHSABS 1.7 03/27/2017 1543   MONOABS 0.3 10/03/2020 0511   EOSABS 0.2 10/03/2020 0511   EOSABS 0.1 03/27/2017 1543   BASOSABS 0.0 10/03/2020 0511   BASOSABS 0.0 03/27/2017 1543    No results found for: "POCLITH", "LITHIUM"   No results found for: "PHENYTOIN", "PHENOBARB", "VALPROATE", "CBMZ"   .res Assessment: Plan:    Plan:  Cymbalta 120mg  daily - taking 60mg  some days - 3 to 4 days a week - reports GI issues. Zyprexa 2.5mg  at hs Prazosin 5mg  at hs - nightmares  Clonazepam 1mg  4 times daily - ODT from tablet   Continue therapy with [name]  RTC 4 weeks  Patient advised to contact office with any questions, adverse effects, or acute worsening in signs and symptoms.  Discussed potential benefits, risk, and side effects of benzodiazepines to include potential risk of tolerance and dependence, as well as possible drowsiness.  Advised patient not to drive if experiencing drowsiness and to take lowest possible effective dose to minimize risk of dependence and tolerance.  Discussed potential benefits, risks, and side effects of stimulants with patient to include increased heart rate, palpitations, insomnia, increased anxiety, increased irritability, or decreased appetite.  Instructed patient to contact office if experiencing any significant tolerability issues.  There are no diagnoses linked to this encounter.   Please see After Visit Summary for patient specific instructions.  Future Appointments  Date Time Provider  Department Center  12/06/2023  3:30 PM Mayline Dragon, Thereasa Solo, NP [facility] None  12/11/2023  3:00 PM Robley Fries, PhD [facility] None  12/25/2023  2:00 PM Robley Fries, PhD [facility] None  01/08/2024  2:00 PM Robley Fries, PhD [facility] None  01/16/2024  1:30 PM Darrick Grinder, RD [facility] None  01/21/2024  2:00 PM Robley Fries, PhD [facility] None    No orders of the defined types were placed in this encounter.     -------------------------------

## 2023-12-11 ENCOUNTER — Ambulatory Visit: Payer: Medicaid Other | Admitting: Psychiatry

## 2023-12-11 DIAGNOSIS — F331 Major depressive disorder, recurrent, moderate: Secondary | ICD-10-CM

## 2023-12-11 DIAGNOSIS — F4312 Post-traumatic stress disorder, chronic: Secondary | ICD-10-CM | POA: Diagnosis not present

## 2023-12-11 DIAGNOSIS — Z62820 Parent-biological child conflict: Secondary | ICD-10-CM

## 2023-12-11 DIAGNOSIS — F411 Generalized anxiety disorder: Secondary | ICD-10-CM | POA: Diagnosis not present

## 2023-12-11 DIAGNOSIS — F5104 Psychophysiologic insomnia: Secondary | ICD-10-CM | POA: Diagnosis not present

## 2023-12-11 DIAGNOSIS — G8929 Other chronic pain: Secondary | ICD-10-CM

## 2023-12-11 DIAGNOSIS — F909 Attention-deficit hyperactivity disorder, unspecified type: Secondary | ICD-10-CM

## 2023-12-11 NOTE — Progress Notes (Signed)
 Psychotherapy Progress Note [facility] Deanna Ferry, PhD LP  Patient ID: Deanna George)    MRN: [id number] Therapy format: Individual psychotherapy Date: 12/11/2023      Start: 3:05p     Stop: 4:10p     Time Spent: 65 min Location: Telehealth visit -- I connected with this patient by an approved telecommunication method (video), with her informed consent, and verifying identity and patient privacy.  I was located at my office and patient at her home.  As needed, we discussed the limitations, risks, and security and privacy concerns associated with telehealth service, including the availability and conditions which currently govern in-person appointments and the possibility that 3rd-party payment may not be fully guaranteed and she may be responsible for charges.  After she indicated understanding, we proceeded with the session.  Also discussed treatment planning, as needed, including ongoing verbal agreement with the plan, the opportunity to ask and answer all questions, her demonstrated understanding of instructions, and her readiness to call the office should symptoms worsen or she feels she is in a crisis state and needs more immediate and tangible assistance.   Session narrative (presenting needs, interim history, self-report of stressors and symptoms, applications of prior therapy, status changes, and interventions made in session) Signed on 3:05p, after having noticed Pt showing online already, but not there on sign in.  Reached by phone 10 minutes on, worked through unusual tech difficulties for 10 further minutes, extending session time to accommodate clinical needs.  Proud to report that she dealt well with having lunch with both [place] and Deanna George recently.  Kept her serenity through an irritable turn from Deanna George and a string of superior-sounding comments from her.  Able to spot her queer daughter some intensified personal stress from identity-hostile current  politics, as well as an unexpected cut in her work hours, and even pulled off staying overnight without a fight.  Notable positive moves made in redirecting from complaints to the next thing they might do, and in acknowledging Deanna George's point in principle about Deanna George being self-deprecating while pointing out that she is working on that actually.  Also begging they collaborate on pacing bad news rather than soak in it.  Even offered Deanna George a hand and head massage like old times, which looked to be enormously relaxing.  Affirmed applying various positive communication tactics and the kind offer to help Deanna George regulate her own refractory nervous system, as well as suspending judgment, and finding fun amid tensions.  Results seem good -- Deanna George was even allowed to express pride in "them" for finding a new counselor (vs risk of reigniting defensiveness about her old one).  It's proving to be durable learning for Pt that she is in "overtime" parenting now, more about affirming her kids' growing abilities, and handing off unrealistic responsibility to protect them to them.    Forging a regular 3-way get together now, too, with hopes of strengthening their bonds and making more frequent opportunity to connect and belong, in addition to just furthering their track record together for getting along normally and noncatastrophically, so everyone can relax more.  Dedicatedly remembering guidance given to slow down instead of speed up in the face of reactivity, remember she doesn't have to protect or placate, and she can use "Let me think about it" anytime it seems to be rushing her.  And of course, recalling the idea of "backhanded compliments" to reframe and forgive harshness she receives.  It's clearly paying off in the kids  approaching her, overall.  Affirmed and encouraged.  Has had some nightmares of late.  Coped with grounding skills and the self-reminder it's a dream, not real, but close to panic.  Notes current stresses  that probably drive it, in intensifying physical issue (right knee, unsure if it will require surgery), anda new affront in how Deanna George is not talking to her.  Not sure why, but knows she is Bipolar, with a shock therapy history, and she could easily be in a disabling winter depression.  Hx of a surprise email from her 3 yrs ago, blaming Deanna George for being painful to listen to, jabbering on, and calling Deanna George out as selfish.  Support/validation provided.  Something recently, not as tough.  Dubious theory that mother's mood disorder has benefitted from going home to [place] in years past, expressly because the weather is springlike in winter.  (Not factual -- December temp averages low 40s then, but allowed to believe it for now.)  Encouraged in trusting nightmares to be working out something she needs, not a sign of breakdown, and certainly encouraged in taking her Deanna George with a grain of salt, as she has her own issues, still doesn't know that well how it works for Deanna George, and she is not fully suited to the kind of reattachment and trusting guidance Deanna George wants from her.  Therapeutic modalities: Cognitive Behavioral Therapy, Solution-Oriented/Positive Psychology, and Ego-Supportive  Mental Status/Observations:  Appearance:   Casual     Behavior:  Appropriate  Motor:  Normal  Speech/Language:   Clear and Coherent  Affect:  Appropriate  Mood:  anxious and dysthymic  Thought process:  normal  Thought content:    Generally WNL, a few overvalued ideas  Sensory/Perceptual disturbances:    WNL  Orientation:  Fully oriented  Attention:  Good    Concentration:  Fair  Memory:  WNL  Insight:    Variable  Judgment:   Good  Impulse Control:  Good   Risk Assessment: Danger to Self: No Self-injurious Behavior: No Danger to Others: No Physical Aggression / Violence: No Duty to Warn: No Access to Firearms a concern: No  Assessment of progress:  progressing  Diagnosis:   ICD-10-CM   1. Generalized anxiety disorder   F41.1     2. Major depressive disorder, recurrent episode, moderate (HCC)  F33.1     3. Chronic post-traumatic stress disorder (PTSD)  F43.12    improving substantially in responses    4. 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 Attention deficit hyperactivity disorder (ADHD), unspecified ADHD type  F90.9     6. Relationship problem between parent and child  Z62.820     7. Other chronic pain - multiple causes incl fibromyalgia  G89.29      Plan:  Safety plan -- Ongoing pledge of no harm, will use crisis call service if unable to guarantee safety.  Recognize SI as a way of getting her own attention on feeling overwhelmed and prioritize actions to mitigate stress or find support.  Ongoing availability on wait list for earlier appts. Anxiety/panic management and sleep -- Pursue/maintain appropriate sleep medication, refrain from up-dosing and stacking because it just won't work, it will only tease her into being more anxious.  Practice allowing sleep and consciously settling for doing all she can today but rest for the next.  As needed, self-affirm her disability inquiry is only to direct next actions updating her claim, not a prelude to putting her on the street, and  if further info is needed, we will address it in turn.  Where possible, continue to practice meditation and calming skills at interest, to proficiency in reducing experienced distress.   Relationship with Deanna George -- Pay close attention to open, trusting, revealing behavior and take heart.  Actively dispute catastrophic ideas of what will happen to Deanna George, or their relationship, and recognize PTSD imagination in progress, dispute.  Reaffirm it is developmentally Deanna George's business to work out for herself what her boundaries will be with peers and father.  Practice trust that Deanna George will use her independent mind to figure it out without wrecking her life or taking [name]'s perceived bait in the process, and that she will  just as vigorously defend herself with anyone else as she does her mother if she sees the need, including her father and her counselor.  Take any harsh utterances with as much salt as possible, because they are viciously stated for effects, not facts, and often enough can be construed as "backhanded compliments" (i.e., evidence of trust, actually).  OK to confront rudeness if it comes, or ask if she meant something the way it sounds, but may be more worth it to let it pass with grace and focus on controlling one's own tendencies to catastrophize and overtalk.  Advocate timeout agreement, prepared when calm, if things turns harsh.  Ongoing advice to firmly refuse her own impulses to irritating behaviors like overexplaining, overworry, over-probing, or trying in any way to lobby Deanna George's choices without first getting her explicit consent to challenge something. FOO issues -- May consult birth mother and stepmother at will, but also OK not to press it if what she gets is further distressing.  Monitor for inadvertently alarmist advice and be willing to tamp it down for herself.  Explore at discretion only if cogent and careful enough to avoid being triggered to paranoia, or compulsive agreement with wild ideas, or an obligation to swallow underinformed views.  Maintain her own authority to decide how to approach it and when to refuse torturous boundaries.  Stay very realistic about expecting a maturity she can depend on from birth mother and her nominal "sisters", all of whom have their own problems and blind spots, and none of whom have a true longterm family relationship with her.  Actively dispute any idea that she is rejected or indicted by people who honestly don't know her that well.  Take all family advice with skepticism, stay ready to recognize when urgency to help will jump to conclusions and make hasty judgments.  Stay ready to write off bad reactions as either too agenda-driven or just too ignorant about  her to see the better.   Health -- Keep up with recommended referrals and assessments, treatments for pain and kidney care.  Recommend common vitamin supplements if able for neurological health, including B complex and D.  Advice against smoking and sodas for general health and kidney protection, as well as gastric irritation. Poverty -- Continue to endorse SSDI as she is unable to hold any gainful employment with a slew of medical and emotional conditions.  Financial counseling as available, otherwise notice and flag any overly expensive items.  Ongoing encouragement to use, and ask about, patient resources, Medicaid and Medicare privileges, and utilize social work rather than make helpless assumptions. Other recommendations/advice -- As may be noted above.  Continue to utilize previously learned skills ad lib. Medication compliance -- Maintain medication as prescribed and work faithfully with relevant prescriber(s) if any changes are desired or  seem indicated. Crisis service -- Aware of call list and work-in appts.  Call the clinic on-call service, 988/hotline, 911, or present to [facility] or ER if any life-threatening psychiatric crisis. Followup -- Return for time as already scheduled, avail earlier @ PT's need.  Next scheduled visit with me 12/25/2023.  Next scheduled in this office 12/25/2023.  Maretta Shaper, PhD Deanna Ferry, PhD LP Clinical Psychologist, [facility] [address] [place], [place] [address] [phone]

## 2023-12-25 ENCOUNTER — Ambulatory Visit: Payer: Medicaid Other | Admitting: Psychiatry

## 2023-12-25 DIAGNOSIS — F331 Major depressive disorder, recurrent, moderate: Secondary | ICD-10-CM | POA: Diagnosis not present

## 2023-12-25 DIAGNOSIS — F447 Conversion disorder with mixed symptom presentation: Secondary | ICD-10-CM

## 2023-12-25 DIAGNOSIS — F411 Generalized anxiety disorder: Secondary | ICD-10-CM

## 2023-12-25 DIAGNOSIS — F4312 Post-traumatic stress disorder, chronic: Secondary | ICD-10-CM | POA: Diagnosis not present

## 2023-12-25 DIAGNOSIS — F5104 Psychophysiologic insomnia: Secondary | ICD-10-CM | POA: Diagnosis not present

## 2023-12-25 DIAGNOSIS — G8929 Other chronic pain: Secondary | ICD-10-CM

## 2023-12-25 DIAGNOSIS — F909 Attention-deficit hyperactivity disorder, unspecified type: Secondary | ICD-10-CM

## 2023-12-25 DIAGNOSIS — Z62820 Parent-biological child conflict: Secondary | ICD-10-CM

## 2023-12-25 NOTE — Progress Notes (Signed)
 Psychotherapy Progress Note [facility] [name], PhD LP  Patient ID: Deanna George)    MRN: [id number] Therapy format: Individual psychotherapy Date: 12/25/2023      Start: 2:15p     Stop: 3:05p     Time Spent: 50 min Location: In-person   Session narrative (presenting needs, interim history, self-report of stressors and symptoms, applications of prior therapy, status changes, and interventions made in session) Half-terrified today, comes in person because of receiving a SS disability.  Walked through the form and worked through getting her to understand she does not have to compile lists of doctor's visits or put up a detailed letter at this time.  It's all presuming requirements based on her anxiety and history of having to do more and more to placate authority with no assurance it will work, along with her fear of being rejected, abandoned, not taken care of, but this is a stable institution she is working with, with established standards, a track record of recognizing her needs and qualifications, and Tx experience of the process is that it is way too early to assume a great burden to document anything.  Somewhat reassured, but still shaken.  Worry about it rings and rings, and has been causing severe insomnia.  Still using all her meds as directed, and applying self-instructions to "don't do home work in bed", but it's just that intense, and undeniable to worry.  Allowed to leave a copy with me and promise to go back over the form if she actually needs, but for now trust that it either won't mean a challenge to prove again, or that any questions SSI has will be answerable.  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  Reports today passing out again, and bruising her hand on the wall trying to catch herself.  Becoming phobic again of leaving the house, though she did it today.  Encouraged to trust  her system more not to become dysfunctional when she truly needs it. Pain management f/u in March.  Apart from that, relationship with Summer Ladell Pickett) going well.  She is on a new ADHD med that agrees with her, and she is meshing well with her new counselor, all of which is encouraging for her welfare and emotional safety interacting.  Just wish for more secure feeling about their attachment.  Support/validation provided.   Therapeutic modalities: Cognitive Behavioral Therapy, Solution-Oriented/Positive Psychology, Ego-Supportive, and Psycho-education/Bibliotherapy  Mental Status/Observations:  Appearance:   Casual     Behavior:  Appropriate  Motor:  Tremor  Speech/Language:   Clear and Coherent  Affect:  Appropriate  Mood:  anxious  Thought process:  Obsessive, responsive to tx  Thought content:    Obsessions  Sensory/Perceptual disturbances:    WNL  Orientation:  Fully oriented  Attention:  Good    Concentration:  Fair  Memory:  WNL  Insight:    Fair  Judgment:   Variable  Impulse Control:  Good   Risk Assessment: Danger to Self: No Self-injurious Behavior: No Danger to Others: No Physical Aggression / Violence: No Duty to Warn: No Access to Firearms a concern: No  Assessment of progress:  progressing  Diagnosis:   ICD-10-CM   1. Major depressive disorder, recurrent episode, moderate (HCC)  F33.1     2. Chronic post-traumatic stress disorder (PTSD)  F43.12     3. Generalized anxiety disorder with panic attacks  F41.1  4. 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 Attention deficit hyperactivity disorder (ADHD), unspecified ADHD type  F90.9     6. Relationship problem between parent and child  Z62.820     7. Other chronic pain - multiple causes incl fibromyalgia  G89.29     8. 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  Recognize SI as a way of getting her own attention on feeling overwhelmed and prioritize actions to mitigate stress or find support.  Ongoing availability on wait list for earlier appts. Anxiety/panic management and sleep -- Pursue/maintain appropriate sleep medication, refrain from up-dosing and stacking because it just won't work, it will only tease her into being more anxious.  Practice allowing sleep and consciously settling for doing all she can today but rest for the next.  As needed, self-affirm her disability inquiry is only to direct next actions updating her claim, not a prelude to putting her on the street, and if further info is needed, we will address it in turn.  Where possible, continue to practice meditation and calming skills at interest, to proficiency in reducing experienced distress.   Relationship with Summer -- Pay close attention to open, trusting, revealing behavior and take heart.  Actively dispute catastrophic ideas of what will happen to Summer, or their relationship, and recognize PTSD imagination in progress, dispute.  Reaffirm it is developmentally Summer's business to work out for herself what her boundaries will be with peers and father.  Practice trust that Summer will use her independent mind to figure it out without wrecking her life or taking [name]'s perceived bait in the process, and that she will just as vigorously defend herself with anyone else as she does her mother if she sees the need, including her father and her counselor.  Take any harsh utterances with as much salt as possible, because they are viciously stated for effects, not facts, and often enough can be construed as "backhanded compliments" (i.e., evidence of trust, actually).  OK to confront rudeness if it comes, or ask if she meant something the way it sounds, but may be more worth it to let it pass with grace and focus on controlling one's own tendencies to catastrophize and overtalk.   Advocate timeout agreement, prepared when calm, if things turns harsh.  Ongoing advice to firmly refuse her own impulses to irritating behaviors like overexplaining, overworry, over-probing, or trying in any way to lobby Summer's choices without first getting her explicit consent to challenge something. FOO issues -- May consult birth mother and stepmother at will, but also OK not to press it if what she gets is further distressing.  Monitor for inadvertently alarmist advice and be willing to tamp it down for herself.  Explore at discretion only if cogent and careful enough to avoid being triggered to paranoia, or compulsive agreement with wild ideas, or an obligation to swallow underinformed views.  Maintain her own authority to decide how to approach it and when to refuse torturous boundaries.  Stay very realistic about expecting a maturity she can depend on from birth mother and her nominal "sisters", all of whom have their own problems and blind spots, and none of whom have a true longterm family relationship with her.  Actively dispute any idea that she is rejected or indicted by people who  honestly don't know her that well.  Take all family advice with skepticism, stay ready to recognize when urgency to help will jump to conclusions and make hasty judgments.  Stay ready to write off bad reactions as either too agenda-driven or just too ignorant about her to see the better.   Health -- Keep up with recommended referrals and assessments, treatments for pain and kidney care.  Recommend common vitamin supplements if able for neurological health, including B complex and D.  Advice against smoking and sodas for general health and kidney protection, as well as gastric irritation. Poverty -- Continue to endorse SSDI as she is unable to hold any gainful employment with a slew of medical and emotional conditions.  Financial counseling as available, otherwise notice and flag any overly expensive items.  Ongoing  encouragement to use, and ask about, patient resources, Medicaid and Medicare privileges, and utilize social work rather than make helpless assumptions. Other recommendations/advice -- As may be noted above.  Continue to utilize previously learned skills ad lib. Medication compliance -- Maintain medication as prescribed and work faithfully with relevant prescriber(s) if any changes are desired or seem indicated. Crisis service -- Aware of call list and work-in appts.  Call the clinic on-call service, 988/hotline, 911, or present to [facility] or ER if any life-threatening psychiatric crisis. Followup -- Return for time as already scheduled, avail earlier @ PT's need.  Next scheduled visit with me 01/08/2024.  Next scheduled in this office 01/08/2024.  Maretta Shaper, PhD [name], PhD LP Clinical Psychologist, [facility] [address] [place], [place] [address] [phone]

## 2024-01-04 ENCOUNTER — Other Ambulatory Visit: Payer: Self-pay | Admitting: Adult Health

## 2024-01-04 DIAGNOSIS — F331 Major depressive disorder, recurrent, moderate: Secondary | ICD-10-CM

## 2024-01-08 ENCOUNTER — Ambulatory Visit: Payer: Medicaid Other | Admitting: Psychiatry

## 2024-01-08 DIAGNOSIS — F331 Major depressive disorder, recurrent, moderate: Secondary | ICD-10-CM

## 2024-01-08 DIAGNOSIS — F447 Conversion disorder with mixed symptom presentation: Secondary | ICD-10-CM

## 2024-01-08 DIAGNOSIS — F5104 Psychophysiologic insomnia: Secondary | ICD-10-CM

## 2024-01-08 DIAGNOSIS — Z599 Problem related to housing and economic circumstances, unspecified: Secondary | ICD-10-CM

## 2024-01-08 DIAGNOSIS — F909 Attention-deficit hyperactivity disorder, unspecified type: Secondary | ICD-10-CM

## 2024-01-08 DIAGNOSIS — F4312 Post-traumatic stress disorder, chronic: Secondary | ICD-10-CM

## 2024-01-08 DIAGNOSIS — Z62820 Parent-biological child conflict: Secondary | ICD-10-CM

## 2024-01-08 DIAGNOSIS — F411 Generalized anxiety disorder: Secondary | ICD-10-CM

## 2024-01-08 DIAGNOSIS — G8929 Other chronic pain: Secondary | ICD-10-CM
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
## 2024-01-10 ENCOUNTER — Telehealth: Payer: Medicaid Other | Admitting: Adult Health

## 2024-01-10 ENCOUNTER — Encounter: Payer: Self-pay | Admitting: Adult Health

## 2024-01-10 DIAGNOSIS — F331 Major depressive disorder, recurrent, moderate: Secondary | ICD-10-CM

## 2024-01-10 DIAGNOSIS — F411 Generalized anxiety disorder: Secondary | ICD-10-CM

## 2024-01-10 DIAGNOSIS — G4700 Insomnia, unspecified: Secondary | ICD-10-CM | POA: Diagnosis not present

## 2024-01-10 DIAGNOSIS — F4312 Post-traumatic stress disorder, chronic: Secondary | ICD-10-CM

## 2024-01-10 DIAGNOSIS — F410 Panic disorder [episodic paroxysmal anxiety] without agoraphobia: Secondary | ICD-10-CM

## 2024-01-10 MED ORDER — CLONAZEPAM 1 MG PO TABS: 1.0000 mg | ORAL_TABLET | Freq: Four times a day (QID) | ORAL | 2 refills | Status: DC | PRN

## 2024-01-10 NOTE — Progress Notes (Signed)
 Deanna George [id number] May 12, 1966 58 y.o.  Virtual Visit via Video Note  I connected with pt @ on 01/10/24 at  3:00 PM EST by a video enabled telemedicine application and verified that I am speaking with the correct person using two identifiers.   I discussed the limitations of evaluation and management by telemedicine and the availability of in person appointments. The patient expressed understanding and agreed to proceed.  I discussed the assessment and treatment plan with the patient. The patient was provided an opportunity to ask questions and all were answered. The patient agreed with the plan and demonstrated an understanding of the instructions.   The patient was advised to call back or seek an in-person evaluation if the symptoms worsen or if the condition fails to improve as anticipated.  I provided 25 minutes of non-face-to-face time during this encounter.  The patient was located at home.  The provider was located at [facility].   Deanna Gibbs, NP   Subjective:   Patient ID:  Deanna George is a 58 y.o. (DOB Apr 14, 1966) female.  Chief Complaint: No chief complaint on file.   HPI Deanna George presents for follow-up of PTSD, GAD, MDD, insomnia and panic disorder.  Describes mood today as "about the same". Pleasant. Reports tearful at times. Mood symptoms - reports depression, irritability and anxiety. Reports low interest and motivation. Reports panic attacks. Reports worry, rumination, and over thinking. Reports ongoing situational stressors - health and financial. Reports difficulties completing daily tasks. Reports chronic pain issues and multiple health issues. Reports mood is variable. Stating "I feel like I'm struggling every day". Feels like medications continue to be helpful. Continues to work with Dr. Farrel Demark. Taking medications as prescribed.  Energy levels lower. Active, does not have a regular exercise routine with physical limitations.   Unable to  enjoy usual interests and activities. Single. Lives alone with cat and 3 birds. Has 2 children.  Appetite adequate - poor. Reports weight loss. Current weight - 170 pounds. Reports sleeping difficulties. Averages 4 hours most nights. Focus and concentration difficulties - severe ADHD. Completing some tasks. Managing some aspects of household. Disabled. Denies SI or HI.  Denies AH or VH. Denies self harm. Reports smoking THC daily - OTC - nausea and overall pain.  Previous medications: Ritalin and Thorazine - others, Trazadone   Review of Systems:  Review of Systems  Musculoskeletal:  Negative for gait problem.  Neurological:  Negative for tremors.  Psychiatric/Behavioral:         Please refer to HPI    Medications: I have reviewed the patient's current medications.  Current Outpatient Medications  Medication Sig Dispense Refill   albuterol (VENTOLIN HFA) 108 (90 Base) MCG/ACT inhaler Inhale 2 puffs into the lungs every 6 (six) hours as needed for wheezing or shortness of breath. 8 g 2   CANNABIDIOL PO Take by mouth at bedtime.     clonazePAM (KLONOPIN) 1 MG tablet Take 1 tablet (1 mg total) by mouth 4 (four) times daily as needed for anxiety. 120 tablet 2   cyclobenzaprine (FLEXERIL) 10 MG tablet TAKE 1 TABLET BY MOUTH EVERY 8 HOURS AS NEEDED FOR 30 DAYS     dicyclomine (BENTYL) 10 MG capsule Take 1 capsule (10 mg total) by mouth 4 (four) times daily -  before meals and at bedtime for 7 days. 28 capsule 0   diphenhydrAMINE (BENADRYL) 25 mg capsule Take 25 mg by mouth every 6 (six) hours as needed for allergies.  DULoxetine (CYMBALTA) 30 MG capsule Take one capsule daily - tapering up to the 60mg  daily. 30 capsule 0   DULoxetine (CYMBALTA) 60 MG capsule Take 2 capsules (120 mg total) by mouth daily. 60 capsule 5   empagliflozin (JARDIANCE) 25 MG TABS tablet Take 25 mg by mouth daily.     fluticasone (FLONASE) 50 MCG/ACT nasal spray Place 2 sprays into both nostrils daily for 7  days. 15.8 mL 0   HYDROcodone-acetaminophen (NORCO) 5-325 MG tablet Take 1 tablet by mouth every 6 (six) hours as needed for moderate pain. (Patient not taking: Reported on 03/26/2023) 15 tablet 0   hyoscyamine (LEVSIN SL) 0.125 MG SL tablet Place 0.125 mg under the tongue every 6 (six) hours as needed for cramping.     ibuprofen (ADVIL) 800 MG tablet 1 tablet with food or milk as needed     loperamide (IMODIUM) 2 MG capsule Take 1 capsule (2 mg total) by mouth as needed for diarrhea or loose stools. 30 capsule 0   losartan (COZAAR) 50 MG tablet Take 1 tablet (50 mg total) by mouth in the morning and at bedtime. 60 tablet 11   Melatonin 10 MG CAPS Take 10 mg by mouth at bedtime as needed.      metoprolol tartrate (LOPRESSOR) 25 MG tablet TAKE 1 TABLET BY MOUTH TWICE DAILY. (Patient not taking: Reported on 03/26/2023) 60 tablet 11   OLANZapine (ZYPREXA) 2.5 MG tablet TAKE 1 TABLET BY MOUTH AT BEDTIME. 30 tablet 0   omeprazole (PRILOSEC) 40 MG capsule Take 1 capsule (40 mg total) by mouth in the morning and at bedtime. 60 capsule 0   ondansetron (ZOFRAN) 4 MG tablet Take 1 tablet (4 mg total) by mouth every 6 (six) hours as needed for nausea or vomiting. 30 tablet 11   ondansetron (ZOFRAN) 4 MG tablet Take 1 tablet (4 mg total) by mouth every 6 (six) hours as needed for nausea. 20 tablet 0   prazosin (MINIPRESS) 5 MG capsule Take 1 capsule (5 mg total) by mouth at bedtime. 30 capsule 5   predniSONE (STERAPRED UNI-PAK 21 TAB) 10 MG (21) TBPK tablet As directed on packaging 1 each 0   promethazine (PHENERGAN) 25 MG tablet Take 1 tablet (25 mg total) by mouth every 6 (six) hours as needed for nausea or vomiting. 30 tablet 11   propranolol (INDERAL) 20 MG tablet Take 1 tablet by mouth 2 (two) times daily.     rizatriptan (MAXALT-MLT) 10 MG disintegrating tablet Take 1 tablet (10 mg total) by mouth as needed. May repeat in 2 hours if needed 15 tablet 4   SUMAtriptan (IMITREX) 50 MG tablet Take 50 mg by mouth  every 2 (two) hours as needed for migraine. May repeat in 2 hours if headache persists or recurs.     SUMAtriptan (IMITREX) 6 MG/0.5ML SOLN injection Inject 0.5 mLs (6 mg total) into the skin as needed for migraine. May repeat in 2 hours if headache persists or recurs. 10 vial 4   No current facility-administered medications for this visit.    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    Situational stress    Urinary tract infection     Family History  Adopted: Yes  Problem Relation Age of Onset   Colon cancer Neg Hx    Pancreatic cancer Neg Hx    Stomach cancer Neg Hx    Rectal cancer Neg Hx     Social History   Socioeconomic History   Marital status: Divorced    Spouse name: Not on file   Number of children: 2   Years of education: Not on file   Highest education level: Not on file  Occupational History   Occupation: student    Comment: graduated 2018  Tobacco Use   Smoking status: Some Days    Types: Cigarettes   Smokeless tobacco: Never  Vaping Use   Vaping status: Never Used  Substance and Sexual Activity   Alcohol use: Not Currently    Comment: Rare   Drug use: Not Currently   Sexual activity: Not Currently  Other Topics Concern   Not on file  Social History Narrative   Not on file   Social Drivers of Health   Financial Resource Strain: High Risk (08/01/2023)   Received from [facility]   Overall Financial Resource Strain (CARDIA)    Difficulty of Paying Living Expenses: Very hard  Food Insecurity: Food Insecurity Present (08/01/2023)   Received from [facility]   Hunger Vital Sign    Worried About Running Out of Food in the Last Year: Often true    Ran Out of Food in the Last Year: Sometimes true  Transportation Needs: Unmet Transportation Needs (08/01/2023)   Received from [facility]   PRAPARE - Transportation    In the past 12 months, has lack of transportation kept you from medical appointments or from getting medications?: Yes    Lack of Transportation (Non-Medical): Yes  Physical Activity: Inactive (08/01/2023)   Received from [facility]   Exercise Vital Sign    Days of Exercise per Week: 0 days    Minutes of Exercise per Session: 0 min  Stress: Stress Concern Present (08/07/2023)   Received from [facility] - Occupational Stress Questionnaire    Feeling of Stress : Very much  Social Connections: Socially Isolated (08/01/2023)   Received from [facility]   Social Connection and Isolation Panel [NHANES]    Frequency of Communication with Friends and Family: Once a week    Frequency of Social Gatherings with Friends and Family: Never    Attends Religious Services: Never    Database administrator or Organizations: No    Attends Banker Meetings: Never    Marital Status: Divorced  Catering manager Violence: Not At Risk (11/17/2021)   Received from [facility]   Humiliation, Afraid, Rape, and Kick questionnaire    Fear of Current or Ex-Partner: No    Emotionally Abused: No    Physically Abused: No    Sexually Abused: No    Past Medical History, Surgical history, Social history, and Family history were reviewed and updated as appropriate.   Please see  review of systems for further  details on the patient's review from today.   Objective:   Physical Exam:  There were no vitals taken for this visit.  Physical Exam Constitutional:      General: She is not in acute distress. Musculoskeletal:        General: No deformity.  Neurological:     Mental Status: She is alert and oriented to person, place, and time.     Coordination: Coordination normal.  Psychiatric:        Attention and Perception: Attention and perception normal. She does not perceive auditory or visual hallucinations.        Mood and Affect: Mood normal. Mood is not anxious or depressed. Affect is not labile, blunt, angry or inappropriate.        Speech: Speech normal.        Behavior: Behavior normal.        Thought Content: Thought content normal. Thought content is not paranoid or delusional. Thought content does not include homicidal or suicidal ideation. Thought content does not include homicidal or suicidal plan.        Cognition and Memory: Cognition and memory normal.        Judgment: Judgment normal.     Comments: Insight intact     Lab Review:     Component Value Date/Time   NA 135 05/16/2021 2351   NA 140 03/27/2017 1543   K 4.2 05/16/2021 2351   CL 105 05/16/2021 2351   CO2 20 (L) 05/16/2021 2351   GLUCOSE 134 (H) 05/16/2021 2351   BUN 18 05/16/2021 2351   BUN 6 03/27/2017 1543   CREATININE 1.20 (H) 08/20/2023 1639   CREATININE 0.95 09/28/2017 1847   CALCIUM 9.4 05/16/2021 2351   PROT 7.9 05/17/2021 0314   PROT 7.2 03/27/2017 1543   ALBUMIN 4.3 05/17/2021 0314   ALBUMIN 4.5 03/27/2017 1543   AST 16 05/17/2021 0314   ALT 12 05/17/2021 0314   ALKPHOS 83 05/17/2021 0314   BILITOT 0.6 05/17/2021 0314   BILITOT 0.2 03/27/2017 1543   GFRNONAA >60 05/16/2021 2351   GFRNONAA 70 09/28/2017 1847   GFRAA 82 09/28/2017 1847       Component Value Date/Time   WBC 11.3 (H) 05/16/2021 2351   RBC 4.30 05/16/2021 2351   HGB 11.8 (L) 05/16/2021 2351   HGB 12.5 03/27/2017 1543   HCT  35.1 (L) 05/16/2021 2351   HCT 39.0 03/27/2017 1543   PLT 209 05/16/2021 2351   PLT 248 03/27/2017 1543   MCV 81.6 05/16/2021 2351   MCV 87 03/27/2017 1543   MCH 27.4 05/16/2021 2351   MCHC 33.6 05/16/2021 2351   RDW 14.8 05/16/2021 2351   RDW 13.6 03/27/2017 1543   LYMPHSABS 1.7 10/03/2020 0511   LYMPHSABS 1.7 03/27/2017 1543   MONOABS 0.3 10/03/2020 0511   EOSABS 0.2 10/03/2020 0511   EOSABS 0.1 03/27/2017 1543   BASOSABS 0.0 10/03/2020 0511   BASOSABS 0.0 03/27/2017 1543    No results found for: "POCLITH", "LITHIUM"   No results found for: "PHENYTOIN", "PHENOBARB", "VALPROATE", "CBMZ"   .res Assessment: Plan:    Plan:  Cymbalta 120mg  daily - 90mg  - reports GI issues. Zyprexa 2.5mg  at hs Prazosin 5mg  at hs - nightmares  Clonazepam 1mg  4 times daily - ODT from tablet   Continue therapy with [name] - meeting every 2 weeks.  RTC 3 months  Patient advised to contact office with any questions, adverse effects, or acute worsening  in signs and symptoms.  Discussed potential benefits, risk, and side effects of benzodiazepines to include potential risk of tolerance and dependence, as well as possible drowsiness.  Advised patient not to drive if experiencing drowsiness and to take lowest possible effective dose to minimize risk of dependence and tolerance.  Discussed potential benefits, risks, and side effects of stimulants with patient to include increased heart rate, palpitations, insomnia, increased anxiety, increased irritability, or decreased appetite.  Instructed patient to contact office if experiencing any significant tolerability issues.  There are no diagnoses linked to this encounter.   Please see After Visit Summary for patient specific instructions.  Future Appointments  Date Time Provider Department Center  01/10/2024  3:00 PM Verneal Wiers, Thereasa Solo, NP [facility] None  01/16/2024  1:30 PM Darrick Grinder, RD [facility] None  01/21/2024  2:00 PM Robley Fries, PhD [facility] None  02/11/2024  2:00 PM Robley Fries, PhD [facility] None  02/20/2024  1:00 PM Robley Fries, PhD [facility] None  03/05/2024  3:00 PM Robley Fries, PhD [facility] None  03/18/2024  2:00 PM Robley Fries, PhD [facility] None    No orders of the defined types were placed in this encounter.     -------------------------------

## 2024-01-16 ENCOUNTER — Ambulatory Visit: Payer: Medicaid Other | Admitting: Dietician

## 2024-01-18 ENCOUNTER — Other Ambulatory Visit: Payer: Self-pay | Admitting: Adult Health

## 2024-01-18 DIAGNOSIS — F331 Major depressive disorder, recurrent, moderate: Secondary | ICD-10-CM

## 2024-01-21 ENCOUNTER — Ambulatory Visit (INDEPENDENT_AMBULATORY_CARE_PROVIDER_SITE_OTHER): Payer: Medicaid Other | Admitting: Psychiatry

## 2024-01-21 DIAGNOSIS — F411 Generalized anxiety disorder: Secondary | ICD-10-CM | POA: Diagnosis not present

## 2024-01-21 DIAGNOSIS — F410 Panic disorder [episodic paroxysmal anxiety] without agoraphobia: Secondary | ICD-10-CM

## 2024-01-21 DIAGNOSIS — F331 Major depressive disorder, recurrent, moderate: Secondary | ICD-10-CM | POA: Diagnosis not present

## 2024-01-21 DIAGNOSIS — F447 Conversion disorder with mixed symptom presentation: Secondary | ICD-10-CM

## 2024-01-21 DIAGNOSIS — F5104 Psychophysiologic insomnia: Secondary | ICD-10-CM

## 2024-01-21 DIAGNOSIS — F4312 Post-traumatic stress disorder, chronic: Secondary | ICD-10-CM | POA: Diagnosis not present

## 2024-01-21 DIAGNOSIS — G8929 Other chronic pain: Secondary | ICD-10-CM

## 2024-01-21 DIAGNOSIS — Z599 Problem related to housing and economic circumstances, unspecified: Secondary | ICD-10-CM

## 2024-01-21 NOTE — Progress Notes (Signed)
 Psychotherapy Progress Note [facility] [name], PhD LP  Patient ID: Deanna George)    MRN: [id number] Therapy format: Individual psychotherapy Date: 01/21/2024      Start: 2:14p     Stop: 3:12p     Time Spent: 58 min Location: Telehealth visit -- I connected with this patient by an approved telecommunication method (video), with her informed consent, and verifying identity and patient privacy.  I was located at my office and patient at her home.  As needed, we discussed the limitations, risks, and security and privacy concerns associated with telehealth service, including the availability and conditions which currently govern in-person appointments and the possibility that 3rd-party payment may not be fully guaranteed and she may be responsible for charges.  After she indicated understanding, we proceeded with the session.  Also discussed treatment planning, as needed, including ongoing verbal agreement with the plan, the opportunity to ask and answer all questions, her demonstrated understanding of instructions, and her readiness to call the office should symptoms worsen or she feels she is in a crisis state and needs more immediate and tangible assistance.   Session narrative (presenting needs, interim history, self-report of stressors and symptoms, applications of prior therapy, status changes, and interventions made in session) Anxiety and depression up recently.  Says she gets limited news, but hearing about tariffs and economic forecast, as well as federal management changes, and her ongoing dependency for financial security.  News of potential welfare changes, and money is short this month already.  Has her thinking about the possibility of being forced into homelessness again, which was traumatic 4 years ago and certainly much more as a teenager in [place] after [place] pulled her out of [employer] before her school admin could arrange for safe refuge.  Has developed a  vomiting response to food and pills, so unable to reliably medicate at this time.  Knows she is at risk for vitamin deficiencies, had a B12 shot, can't swallow her B12 pills yet, and magnesium  is testing low, which is known to be hard to do for how serum regulates itself at the expense of tissue.  Is with a program, [facility], says they do understand she has a food insecurity problem, also, and are working on getting her benefit and supply put together.  Has referral to a nutritionist who knows about eating disorders.    Out of prazosin  right now also, due to some insurance snafu with changing dosages.  Recommended she call her insurance coverage to clarify and put psychiatry in touch if needed.  At least relationship still going smoothly with [name].  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    Despite pressing concerns in day to day health and living, Pt wants to go over her early childhood history of being handed off and missing necessary things from her various parents.  Acknowledged as important, encouraged to keep priority on getting her GI issues addressed in service of all else.  Therapeutic modalities: Cognitive Behavioral Therapy, Solution-Oriented/Positive Psychology, and Ego-Supportive  Mental Status/Observations:  Appearance:   Casual     Behavior:  Appropriate  Motor:  Normal  Speech/Language:   Some pressure  Affect:  Appropriate and tense  Mood:  anxious and depressed  Thought process:  normal  Thought content:    Obsessions  Sensory/Perceptual disturbances:    Pain   Orientation:  Fully oriented  Attention:  Good  Concentration:  Fair  Memory:  WNL  Insight:    Variable  Judgment:   Variable  Impulse Control:  Good   Risk Assessment: Danger to Self: No Self-injurious Behavior: No Danger to Others: No Physical Aggression / Violence: No Duty to Warn: No Access to Firearms a concern: No  Assessment of progress:   stabilized  Diagnosis:   ICD-10-CM   1. Major depressive disorder, recurrent episode, moderate (HCC)  F33.1     2. Chronic post-traumatic stress disorder (PTSD)  F43.12     3. Generalized anxiety disorder  F41.1     4. Panic disorder  F41.0     5. 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 Other chronic pain - multiple causes incl fibromyalgia  G89.29     7. Financial difficulties  Z59.9     8. Functional neurological symptom disorder with mixed symptoms  F44.7    suspect actual gastritis now involved     Plan:  Safety plan -- Ongoing pledge of no harm, will use crisis call service if unable to guarantee safety.  Recognize SI as a way of getting her own attention on feeling overwhelmed and prioritize actions to mitigate stress or find support.  Ongoing availability on wait list for earlier appts. Health -- Keep up with recommended referrals and assessments, treatments for pain, kidney care, and GI distress.  Recommend common vitamin supplements if able for neurological health, including B complex and D.  Advice against smoking and sodas for general health and kidney protection, as well as gastric irritation. Anxiety/panic management and sleep -- Pursue/maintain appropriate sleep medication, refrain from up-dosing and stacking because it just won't work, it will only tease her into being more anxious.  Practice allowing sleep and consciously settling for doing all she can today but rest for the next.  As needed, self-affirm her disability inquiry is only to direct next actions updating her claim, not a prelude to putting her on the street, and if further info is needed, we will address it in turn.  Where possible, continue to practice meditation and calming skills at interest, to proficiency in reducing experienced distress.   Trauma -- Practice acknowledging when intrusive memories occur, acknowledging horrors that happen, and self-validating how she made it out.   Practice acknowledging to loved ones when a moment is shadowed by bad memory and she may need relief. Relationship with Summer -- Pay close attention to open, trusting, revealing behavior and take heart.  Actively dispute catastrophic ideas of what will happen to Summer, or their relationship, and recognize PTSD imagination in progress, dispute.  Reaffirm it is developmentally Summer's business to work out for herself what her boundaries will be with peers and father.  Practice trust that Summer will use her independent mind to figure it out without wrecking her life or taking [name]'s perceived bait in the process, and that she will just as vigorously defend herself with anyone else as she does her mother if she sees the need, including her father and her counselor.  Take any harsh utterances with as much salt as possible, because they are viciously stated for effects, not facts, and often enough can be construed as backhanded compliments (i.e., evidence of trust, actually).  OK to confront rudeness if it comes, or ask if she meant something the way it sounds, but may be more worth it to let it pass with grace and focus on controlling one's own tendencies to catastrophize and overtalk.  Advocate timeout agreement, prepared when calm, if things turns harsh.  Ongoing advice to firmly refuse her own impulses to irritating behaviors like overexplaining, overworry, over-probing, or trying in any way to lobby Summer's choices without first getting her explicit consent to challenge something. FOO issues -- May consult birth mother and stepmother at will, but also OK not to press it if what she gets is further distressing.  Monitor for inadvertently alarmist advice and be willing to tamp it down for herself.  Explore at discretion only if cogent and careful enough to avoid being triggered to paranoia, or compulsive agreement with wild ideas, or an obligation to swallow underinformed views.  Maintain her own authority to  decide how to approach it and when to refuse torturous boundaries.  Stay very realistic about expecting a maturity she can depend on from birth mother and her nominal sisters, all of whom have their own problems and blind spots, and none of whom have a true longterm family relationship with her.  Actively dispute any idea that she is rejected or indicted by people who honestly don't know her that well.  Take all family advice with skepticism, stay ready to recognize when urgency to help will jump to conclusions and make hasty judgments.  Stay ready to write off bad reactions as either too agenda-driven or just too ignorant about her to see the better.   Poverty -- Continue to endorse SSDI as she is unable to hold any gainful employment with a slew of medical and emotional conditions.  Financial counseling as available, otherwise notice and flag any overly expensive items.  Ongoing encouragement to use, and ask about, patient resources, Medicaid and Medicare privileges, and utilize social work rather than make helpless assumptions. Other recommendations/advice -- As may be noted above.  Continue to utilize previously learned skills ad lib. Medication compliance -- Maintain medication as prescribed and work faithfully with relevant prescriber(s) if any changes are desired or seem indicated. Crisis service -- Aware of call list and work-in appts.  Call the clinic on-call service, 988/hotline, 911, or present to [facility] or ER if any life-threatening psychiatric crisis. Followup -- Return for time as already scheduled, put on [place] list.  Next scheduled visit with me 02/11/2024.  Next scheduled in this office 02/11/2024.  Lamar Kendall, PhD [name], PhD LP Clinical Psychologist, [facility] [address], [address] [place], [place] [address] [phone]

## 2024-01-23 ENCOUNTER — Ambulatory Visit (INDEPENDENT_AMBULATORY_CARE_PROVIDER_SITE_OTHER): Admitting: Psychiatry

## 2024-01-23 DIAGNOSIS — G8929 Other chronic pain: Secondary | ICD-10-CM

## 2024-01-23 DIAGNOSIS — F411 Generalized anxiety disorder: Secondary | ICD-10-CM | POA: Diagnosis not present

## 2024-01-23 DIAGNOSIS — F331 Major depressive disorder, recurrent, moderate: Secondary | ICD-10-CM | POA: Diagnosis not present

## 2024-01-23 DIAGNOSIS — G4700 Insomnia, unspecified: Secondary | ICD-10-CM

## 2024-01-23 DIAGNOSIS — F5104 Psychophysiologic insomnia: Secondary | ICD-10-CM

## 2024-01-23 DIAGNOSIS — F4312 Post-traumatic stress disorder, chronic: Secondary | ICD-10-CM | POA: Diagnosis not present

## 2024-01-23 DIAGNOSIS — Z599 Problem related to housing and economic circumstances, unspecified: Secondary | ICD-10-CM

## 2024-01-23 DIAGNOSIS — F447 Conversion disorder with mixed symptom presentation: Secondary | ICD-10-CM

## 2024-01-23 DIAGNOSIS — F410 Panic disorder [episodic paroxysmal anxiety] without agoraphobia: Secondary | ICD-10-CM

## 2024-01-23 NOTE — Progress Notes (Signed)
 Psychotherapy Progress Note [facility] [name], PhD LP  Patient ID: Deanna George)    MRN: [id number] Therapy format: Individual psychotherapy Date: 01/23/2024      Start: 6:17p     Stop: 7:16p     Time Spent: 59 min Location: Telehealth visit -- I connected with this patient by an approved telecommunication method (video), with her informed consent, and verifying identity and patient privacy.  I was located at my office and patient at her home.  As needed, we discussed the limitations, risks, and security and privacy concerns associated with telehealth service, including the availability and conditions which currently govern in-person appointments and the possibility that 3rd-party payment may not be fully guaranteed and she may be responsible for charges.  After she indicated understanding, we proceeded with the session.  Also discussed treatment planning, as needed, including ongoing verbal agreement with the plan, the opportunity to ask and answer all questions, her demonstrated understanding of instructions, and her readiness to call the office should symptoms worsen or she feels she is in a crisis state and needs more immediate and tangible assistance.   Session narrative (presenting needs, interim history, self-report of stressors and symptoms, applications of prior therapy, status changes, and interventions made in session) Called for urgent appt as she finds herself spinning.  3rd day now not feeling like being awake, no appetite, tremors and sweats, needing more Klonopin  to knock down anxiety.  Wishing a nurse or someone could come help her out, give her a shot, make her sleep it off.  Some comfort from stuffed animals.  Has reached out to friends, unavailable, and daughter, who got back in touch.  Agrees a lot of her cycling worry is about financial security and the perceived threat of homelessness and/or being all alone in life, fueled simultaneously by SSDI  review and Financially, has of sweeping government changes rumored to threaten the Social Security system.  The kids underwrite cat food, which is precious to her.  Fielded a call today informing her that the [facility] program has met goals somehow, but she is eligible.    Discussed options for material and personal support, including DSS, calling daughter about sleeping in for a night for two, friends (two, not known well enough to ask), 988 warm line for after-hours urgent support, hospital (can call family to cover petsitting if that is the hangup).  Presently sounds that much more like gastritis has taken over and she needs to stop all irritants to her gut lining so she can hold down food and medicine, which must be irregular at best now.  For tonight, strong advice is to get enough fluids, eliminate caffeine, try to prevent smoke, chamomile tea OK, and mentally stand ready to tell her fear of sleep itself that it's actually OK, it's part of the help, and it's how she's going to rest up to cope tomorrow.  For somatic comfort, let herself go get a shower (or even a rinse), let herself completely off the hook for inventorying troubles, and set the attitude that she has the absolute authority to work tomorrow's problems tomorrow and don't have to answer all the what ifs she can think of tonight.    Therapeutic modalities: Cognitive Behavioral Therapy, Solution-Oriented/Positive Psychology, Ego-Supportive, and problem-solving  Mental Status/Observations:  Appearance:   Casual     Behavior:  Appropriate  Motor:  Restlestness  Speech/Language:   Clear and Coherent  Affect:  Depressed  Mood:  anxious and depressed  Thought process:  Some flight  Thought content:    Obsessions  Sensory/Perceptual disturbances:    pain  Orientation:  Fully oriented  Attention:  Good    Concentration:  Fair  Memory:  WNL  Insight:    Fair  Judgment:   Fair  Impulse Control:  Good   Risk Assessment: Danger to  Self: No Self-injurious Behavior: No Danger to Others: No Physical Aggression / Violence: No Duty to Warn: No Access to Firearms a concern: No  Assessment of progress:  deteriorating -- in need of reassessment  Diagnosis:   ICD-10-CM   1. Major depressive disorder, recurrent episode, moderate (HCC)  F33.1     2. Chronic post-traumatic stress disorder (PTSD)  F43.12     3. Generalized anxiety disorder  F41.1     4. Panic disorder  F41.0     5. 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 Other chronic pain - multiple causes incl fibromyalgia  G89.29     7. Financial difficulties  Z59.9     8. Functional neurological symptom disorder with mixed symptoms  F44.7    suspect true gastritis    9. Insomnia, unspecified type  G47.00      Plan:  Safety plan -- Ongoing pledge of no harm, will use crisis call service or present to hospital if unable to guarantee safety.  Recognize SI as a way of getting her own attention on feeling overwhelmed and prioritize actions to mitigate stress, find support, address material issues in health and life administration.  Ongoing availability on wait list for earlier appts. Health -- Keep up with recommended referrals and assessments, treatments for pain, kidney care, and GI distress.  Recommend common vitamin supplements if able for neurological health, including B complex and D.  Advice against smoking and sodas for general health, kidney protection, and gastric irritation. Anxiety/panic management and sleep -- Pursue/maintain appropriate sleep medication, refrain from up-dosing and stacking because it just won't work, it will only tease her into being more anxious.  Practice allowing sleep and consciously settling for doing all she can today but rest for the next.  As needed, self-affirm her disability inquiry is only to direct next actions updating her claim, not a prelude to putting her on the street, and if further info is needed, we will  address it in turn.  Where possible, continue to practice meditation and calming skills at interest, to proficiency in reducing experienced distress.   Trauma -- Practice acknowledging when intrusive memories occur, acknowledging horrors that happen, and self-validating how she made it out.  Practice acknowledging to loved ones when a moment is shadowed by bad memory and she may need relief. Relationship with Summer -- Pay close attention to open, trusting, revealing behavior and take heart.  Actively dispute catastrophic ideas of what will happen to Summer, or their relationship, and recognize PTSD imagination in progress, dispute.  Reaffirm it is developmentally Summer's business to work out for herself what her boundaries will be with peers and father.  Practice trust that Summer will use her independent mind to figure it out without wrecking her life or taking [name]'s perceived bait in the process, and that she will just as vigorously defend herself with anyone else as she does her mother if she sees the need, including her father and her counselor.  Take any harsh utterances with as much salt as possible, because they are viciously stated for effects, not facts, and often enough  can be construed as backhanded compliments (i.e., evidence of trust, actually).  OK to confront rudeness if it comes, or ask if she meant something the way it sounds, but may be more worth it to let it pass with grace and focus on controlling one's own tendencies to catastrophize and overtalk.  Advocate timeout agreement, prepared when calm, if things turns harsh.  Ongoing advice to firmly refuse her own impulses to irritating behaviors like overexplaining, overworry, over-probing, or trying in any way to lobby Summer's choices without first getting her explicit consent to challenge something. FOO issues -- May consult birth mother and stepmother at will, but also OK not to press it if what she gets is further distressing.  Monitor  for inadvertently alarmist advice and be willing to tamp it down for herself.  Explore at discretion only if cogent and careful enough to avoid being triggered to paranoia, or compulsive agreement with wild ideas, or an obligation to swallow underinformed views.  Maintain her own authority to decide how to approach it and when to refuse torturous boundaries.  Stay very realistic about expecting a maturity she can depend on from birth mother and her nominal sisters, all of whom have their own problems and blind spots, and none of whom have a true longterm family relationship with her.  Actively dispute any idea that she is rejected or indicted by people who honestly don't know her that well.  Take all family advice with skepticism, stay ready to recognize when urgency to help will jump to conclusions and make hasty judgments.  Stay ready to write off bad reactions as either too agenda-driven or just too ignorant about her to see the better.   Poverty -- Continue to endorse SSDI as she is unable to hold any gainful employment with a slew of medical and emotional conditions.  Financial counseling as available, otherwise notice and flag any overly expensive items.  Ongoing encouragement to use, and ask about, patient resources, Medicaid and Medicare privileges, and utilize social work rather than make helpless assumptions. Other recommendations/advice -- As may be noted above.  Continue to utilize previously learned skills ad lib. Medication compliance -- Maintain medication as prescribed and work faithfully with relevant prescriber(s) if any changes are desired or seem indicated. Crisis service -- Aware of call list and work-in appts.  Call the clinic on-call service, 988/hotline, 911, or present to [facility] or ER if any life-threatening psychiatric crisis. Followup -- Return for time as already scheduled, avail earlier @ PT's need, OK for 2 days from now urgent slot.  Next scheduled visit with me 02/11/2024.  Next  scheduled in this office 02/11/2024.  Lamar Kendall, PhD [name], PhD LP Clinical Psychologist, [facility] [address], [address] [place], [place] [address] [phone]

## 2024-01-29 ENCOUNTER — Telehealth: Payer: Self-pay | Admitting: Psychiatry

## 2024-01-29 NOTE — Telephone Encounter (Signed)
 Called patient and she was able to get an appt with Mardelle Matte tomorrow at 5 pm.

## 2024-01-29 NOTE — Telephone Encounter (Signed)
 Next visit is 01/30/24. Last night and today she went down a bit and has thrown up several times today (am). Could someone please call her at [phone].

## 2024-01-30 ENCOUNTER — Ambulatory Visit (INDEPENDENT_AMBULATORY_CARE_PROVIDER_SITE_OTHER): Admitting: Psychiatry

## 2024-01-30 DIAGNOSIS — F4312 Post-traumatic stress disorder, chronic: Secondary | ICD-10-CM | POA: Diagnosis not present

## 2024-01-30 DIAGNOSIS — F331 Major depressive disorder, recurrent, moderate: Secondary | ICD-10-CM

## 2024-01-30 DIAGNOSIS — F447 Conversion disorder with mixed symptom presentation: Secondary | ICD-10-CM

## 2024-01-30 DIAGNOSIS — Z599 Problem related to housing and economic circumstances, unspecified: Secondary | ICD-10-CM

## 2024-01-30 DIAGNOSIS — F410 Panic disorder [episodic paroxysmal anxiety] without agoraphobia: Secondary | ICD-10-CM

## 2024-01-30 DIAGNOSIS — F17200 Nicotine dependence, unspecified, uncomplicated: Secondary | ICD-10-CM

## 2024-01-30 DIAGNOSIS — F411 Generalized anxiety disorder: Secondary | ICD-10-CM | POA: Diagnosis not present

## 2024-01-30 DIAGNOSIS — R69 Illness, unspecified: Secondary | ICD-10-CM

## 2024-01-30 DIAGNOSIS — G8929 Other chronic pain: Secondary | ICD-10-CM

## 2024-01-30 DIAGNOSIS — F5104 Psychophysiologic insomnia: Secondary | ICD-10-CM

## 2024-01-30 NOTE — Progress Notes (Signed)
 Psychotherapy Progress Note [facility] [name], PhD LP  Patient ID: Deanna George)    MRN: [id number] Therapy format: Individual psychotherapy Date: 01/30/2024      Start: 5:10p     Stop: 6:00p     Time Spent: 50 min Location: Telehealth visit -- I connected with this patient by an approved telecommunication method (video), with her informed consent, and verifying identity and patient privacy.  I was located at my office and patient at her home.  As needed, we discussed the limitations, risks, and security and privacy concerns associated with telehealth service, including the availability and conditions which currently govern in-person appointments and the possibility that 3rd-party payment may not be fully guaranteed and she may be responsible for charges.  After she indicated understanding, we proceeded with the session.  Also discussed treatment planning, as needed, including ongoing verbal agreement with the plan, the opportunity to ask and answer all questions, her demonstrated understanding of instructions, and her readiness to call the office should symptoms worsen or she feels she is in a crisis state and needs more immediate and tangible assistance.   Session narrative (presenting needs, interim history, self-report of stressors and symptoms, applications of prior therapy, status changes, and interventions made in session) Very gratified to have had Deanna George stay with her Thurs-Sat last week for comfort and recuperation, and did beautifully as  support.  She has also been solicitous, calling regularly, which is substantially reassuring.  Unfortunately, sleep has not come that well because the cat has been meowing loudly.  Still throwing up regularly, and had quick diarrhea on eating.  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   ADLS are stressful, and she has had basically 1 or 2 meals for the whole of the last 3 weeks.  Has spoken with her case manager nurse, hopefully understanding that this is not just IBS or 40 years of acid reflux.  Feels like her stomach is inflamed and stretched thin.  Has been able to swallow tiny pieces of food, like a bit of donut, in order to deliver medicines that otherwise just get thrown up.  White knuckled it today to do a basic shopping run at [employer].  Trying to use ginger ale, clonazepam , and a cigarette to calm.    Discussed gastric remedies -- she has taken antiacid meds, not understanding that these won't soothe.  Pepto-Bismol has traumatic associations, since Vertell and Wolm forced that after giving her ipecac.  Advised against smoking due to gastric irritation from swallowed smoke, but understand the neurochemical effect is nearly irresistible.  Repeated prior recommendations to eliminate as many irritants as possible, including caffeine.  Battling feeling very alone and forlorn, too, mindful that her family of origin have not reached out or allowed her to be in touch, particularly.  Everyone has severe anxiety disorders, which may be part of it.  Reached out in text to her son Deanna George, but knows he is very committed with his grad Company secretary, hard to reach.    Knows they have tested out Crohn's negative, colonoscopy essentially clean, while fairly recent laparoscopy did see points of irritation gastrically.  Possible she has adhesions, as mentioned.  Normalized a food aversion like this for anyone who has gone through enough digestive ordeal as she has, with or without trauma background, and directed  her to be ready to address any tendency medicine may have to write off sxs as just psych -- it is absolutely clear to me that PTSD and panic cannot accomplish the full ravaging of this GI condition, and anxiety management therapy by itself cannot hope to heal it.  She really should get the  endoscopy once scheduled to know.  Therapeutic modalities: Cognitive Behavioral Therapy, Solution-Oriented/Positive Psychology, Ego-Supportive, and problem-solving  Mental Status/Observations:  Appearance:   Casual     Behavior:  Appropriate  Motor:  Normal  Speech/Language:   Clear and Coherent  Affect:  Appropriate  Mood:  anxious, depressed, and less  Thought process:  normal  Thought content:    Obsessions  Sensory/Perceptual disturbances:    WNL  Orientation:  Fully oriented  Attention:  Good    Concentration:  Fair  Memory:  WNL  Insight:    Fair  Judgment:   Fair  Impulse Control:  Good   Risk Assessment: Danger to Self: No Self-injurious Behavior: No Danger to Others: No Physical Aggression / Violence: No Duty to Warn: No Access to Firearms a concern: No  Assessment of progress:  stabilized  Diagnosis:   ICD-10-CM   1. Panic disorder  F41.0     2. Chronic post-traumatic stress disorder (PTSD)  F43.12     3. Generalized anxiety disorder  F41.1     4. Major depressive disorder, recurrent episode, moderate (HCC)  F33.1     5. Suspected condition -- chronic gastritis with conditioned food aversion, nausea, and undernourishment  R69     6. 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 Functional neurological symptom disorder with mixed symptoms  F44.7    per history    8. Financial difficulties  Z59.9     9. Other chronic pain - multiple causes incl fibromyalgia  G89.29     10. Unable to stop smoking  F17.200      Plan:  Safety plan -- Ongoing pledge of no harm, will use crisis call service or present to hospital if unable to guarantee safety.  Recognize SI as a way of getting her own attention on feeling overwhelmed and prioritize actions to mitigate stress, find support, address material issues in health and life administration.  Ongoing availability on wait list for earlier appts. Health -- Keep up with recommended referrals and  assessments, treatments for pain, kidney care, and GI distress.  Recommend common vitamin supplements if able for neurological health, including B complex and D.  Advice against smoking and sodas for general health, kidney protection, and gastric irritation. Anxiety/panic management and sleep -- Pursue/maintain appropriate sleep medication, refrain from up-dosing and stacking because it just won't work, it will only tease her into being more anxious.  Practice allowing sleep and consciously settling for doing all she can today but rest for the next.  As needed, self-affirm her disability inquiry is only to direct next actions updating her claim, not a prelude to putting her on the street, and if further info is needed, we will address it in turn.  Where possible, continue to practice meditation and calming skills at interest, to proficiency in reducing experienced distress.   Trauma -- Practice acknowledging when intrusive memories occur, acknowledging horrors that happen, and self-validating how she made it out.  Practice acknowledging to loved ones when a moment is shadowed by bad memory and she may need relief. Relationship with Deanna George -- Pay close attention to open, trusting, revealing behavior  and take heart.  Actively dispute catastrophic ideas of what will happen to Deanna George, or their relationship, and recognize PTSD imagination in progress, dispute.  Reaffirm it is developmentally Deanna George's business to work out for herself what her boundaries will be with peers and father.  Practice trust that Deanna George will use her independent mind to figure it out without wrecking her life or taking [name]'s perceived bait in the process, and that she will just as vigorously defend herself with anyone else as she does her mother if she sees the need, including her father and her counselor.  Take any harsh utterances with as much salt as possible, because they are viciously stated for effects, not facts, and often enough can be  construed as backhanded compliments (i.e., evidence of trust, actually).  OK to confront rudeness if it comes, or ask if she meant something the way it sounds, but may be more worth it to let it pass with grace and focus on controlling one's own tendencies to catastrophize and overtalk.  Advocate timeout agreement, prepared when calm, if things turns harsh.  Ongoing advice to firmly refuse her own impulses to irritating behaviors like overexplaining, overworry, over-probing, or trying in any way to lobby Deanna George's choices without first getting her explicit consent to challenge something. FOO issues -- May consult birth mother and stepmother at will, but also OK not to press it if what she gets is further distressing.  Monitor for inadvertently alarmist advice and be willing to tamp it down for herself.  Explore at discretion only if cogent and careful enough to avoid being triggered to paranoia, or compulsive agreement with wild ideas, or an obligation to swallow underinformed views.  Maintain her own authority to decide how to approach it and when to refuse torturous boundaries.  Stay very realistic about expecting a maturity she can depend on from birth mother and her nominal sisters, all of whom have their own problems and blind spots, and none of whom have a true longterm family relationship with her.  Actively dispute any idea that she is rejected or indicted by people who honestly don't know her that well.  Take all family advice with skepticism, stay ready to recognize when urgency to help will jump to conclusions and make hasty judgments.  Stay ready to write off bad reactions as either too agenda-driven or just too ignorant about her to see the better.   Poverty -- Continue to endorse SSDI as she is unable to hold any gainful employment with a slew of medical and emotional conditions.  Financial counseling as available, otherwise notice and flag any overly expensive items.  Ongoing encouragement to  use, and ask about, patient resources, Medicaid and Medicare privileges, and utilize social work rather than make helpless assumptions. Other recommendations/advice -- As may be noted above.  Continue to utilize previously learned skills ad lib. Medication compliance -- Maintain medication as prescribed and work faithfully with relevant prescriber(s) if any changes are desired or seem indicated. Crisis service -- Aware of call list and work-in appts.  Call the clinic on-call service, 988/hotline, 911, or present to [facility] or ER if any life-threatening psychiatric crisis. Followup -- Return for time as already scheduled, avail earlier @ PT's need.  Next scheduled visit with me 02/11/2024.  Next scheduled in this office 02/11/2024.  Lamar Kendall, PhD [name], PhD LP Clinical Psychologist, [facility] [address] [place], [place] [address] [phone]

## 2024-01-31 ENCOUNTER — Other Ambulatory Visit: Payer: Self-pay | Admitting: Gastroenterology

## 2024-01-31 DIAGNOSIS — R6881 Early satiety: Secondary | ICD-10-CM

## 2024-01-31 DIAGNOSIS — R1033 Periumbilical pain: Secondary | ICD-10-CM

## 2024-02-04 ENCOUNTER — Telehealth: Payer: Self-pay | Admitting: Psychiatry

## 2024-02-04 DIAGNOSIS — R69 Illness, unspecified: Secondary | ICD-10-CM

## 2024-02-04 DIAGNOSIS — G8929 Other chronic pain: Secondary | ICD-10-CM

## 2024-02-04 DIAGNOSIS — F331 Major depressive disorder, recurrent, moderate: Secondary | ICD-10-CM

## 2024-02-04 DIAGNOSIS — G4700 Insomnia, unspecified: Secondary | ICD-10-CM

## 2024-02-04 DIAGNOSIS — F4312 Post-traumatic stress disorder, chronic: Secondary | ICD-10-CM

## 2024-02-04 DIAGNOSIS — Z905 Acquired absence of kidney: Secondary | ICD-10-CM

## 2024-02-04 NOTE — Telephone Encounter (Signed)
 Called Deanna George about appt for tomorrow she took it. She states that she will possibly be going to an inpatient facility and was going to call into office asking for an emergency appt. Her daughter Deanna George may be calling into the office to speak with you at some today or this week. She isn't listed on the DPR but she gave verbal permission for you to speak with her.

## 2024-02-04 NOTE — Telephone Encounter (Signed)
 Multiple calls today, from Pt and to D, whom she designated to help handle the crisis including admission to hospital for combination med/psych reasons.  Anxiety, insomnia, nausea, and persistent inability to eat have continued to the point where Pt is desperate to admit.  D initially pursued a private, holistically oriented treatment center in [place], but found that Medicaid is an issue, and in 1st conversation c. 10:55am agreed that medical needs -- particularly suspected severe gastritits, anbout which Pt was unable to attend GI last week -- warrants a med/psych placement instead.  Began looking into [facility], which would be well-positioned to integrate medical and psychiatric care, but leery of trying to admit through the emergency room due to Pt's tendency to panic and dissociate there, asks for a referral call from me.  Agreed for Summer to assess availability and return call with findings to ensure that 1 referral call gets made.  As of 12:15, no word yet, but Pt back on phone with another staff member, now talking about admitting locally, whether it is [facility] or [facility].    Return call to Summer 12:25p, who says appropriate number would be [phone] [facility]) to make clinician referral.  Est arrival 4-5pm in c/o Summer.  Call made to that number, option 2 (clinician referral from home to hospital) -- provided case summary and reasons for acute admission.  Referral nurse able to locate her record in the [facility] system, will staff and advise.  Recommended Pt and D present to [facility] ED over [facility], given current traffic. Callback to Summer, apprised of the process, confirmed she will be escorting Pt and working out ROI for coordination and continuity of care during her stay and leading to discharge planning.    Confirm we will remain available to continue outpatient services when appropriate, and tomorrow's urgently scheduled OP session with me cancelled.  Apprise  Ms. Mozingo of intended admission and likely call for coordination.  AM

## 2024-02-05 ENCOUNTER — Ambulatory Visit: Admitting: Psychiatry

## 2024-02-05 ENCOUNTER — Telehealth: Payer: Self-pay | Admitting: Psychiatry

## 2024-02-05 DIAGNOSIS — Z905 Acquired absence of kidney: Secondary | ICD-10-CM

## 2024-02-05 DIAGNOSIS — F332 Major depressive disorder, recurrent severe without psychotic features: Secondary | ICD-10-CM

## 2024-02-05 DIAGNOSIS — G8929 Other chronic pain: Secondary | ICD-10-CM

## 2024-02-05 DIAGNOSIS — F5104 Psychophysiologic insomnia: Secondary | ICD-10-CM

## 2024-02-05 DIAGNOSIS — R69 Illness, unspecified: Secondary | ICD-10-CM

## 2024-02-05 DIAGNOSIS — F4312 Post-traumatic stress disorder, chronic: Secondary | ICD-10-CM

## 2024-02-05 NOTE — Telephone Encounter (Signed)
 Deanna George called this morning stating that she is admitted at inpatient hospital and was told that her patient rights are being taken away and she may be locked away. She is quite concerned and wanted you to know. She would like a rtc. Her daughter is also able to speak with you.
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
## 2024-02-08 ENCOUNTER — Telehealth: Payer: Self-pay | Admitting: Psychiatry

## 2024-02-08 ENCOUNTER — Other Ambulatory Visit
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
## 2024-02-08 NOTE — Telephone Encounter (Signed)
 Pt called after 4 today to let you know that she has been placed in inpatient hospital at [facility]. She would love a call if you can, but cannot make her appts for next week.

## 2024-02-11 ENCOUNTER — Ambulatory Visit: Payer: Medicaid Other | Admitting: Psychiatry

## 2024-02-15 ENCOUNTER — Other Ambulatory Visit

## 2024-02-20 ENCOUNTER — Ambulatory Visit: Payer: Medicaid Other | Admitting: Psychiatry

## 2024-02-20 DIAGNOSIS — F4312 Post-traumatic stress disorder, chronic: Secondary | ICD-10-CM

## 2024-02-20 DIAGNOSIS — R69 Illness, unspecified: Secondary | ICD-10-CM

## 2024-02-20 DIAGNOSIS — Z599 Problem related to housing and economic circumstances, unspecified: Secondary | ICD-10-CM

## 2024-02-20 DIAGNOSIS — F5104 Psychophysiologic insomnia: Secondary | ICD-10-CM

## 2024-02-20 DIAGNOSIS — G8929 Other chronic pain: Secondary | ICD-10-CM

## 2024-02-20 DIAGNOSIS — F332 Major depressive disorder, recurrent severe without psychotic features: Secondary | ICD-10-CM

## 2024-02-20 NOTE — Progress Notes (Signed)
 Admin note for non-service contact  Patient ID: [name]  MRN: [id number] DATE: 02/20/2024  Scheduled today for telehealth session 1pm, not seen.  Last seen 3/19, then hospitalized a few days later at [facility] for prolonged severe sleep and GI issues with escalating panic and SI.  See other communications notes re process entering the hospital and re evidence of posttraumatic transference arriving, at least initially, 2 unfulfillable requests to call her there, and unreturned messages to her and to her daughter Summer.  Known and suspected activating factors in her breakdown included notice of a scheduled Social Security review of her disability, a spiral of worry over being forced to return to homelessness, and looming cuts to federally funded safety net programs including Social Security, Medicaid, and SNAP presumed to possibly affect her as well, already on a shoestring affording her home, food, and self-care.  Scheduled session3 3/25 and 3/31 were cancelled for hospitalization, with today's visit remaining on the calendar.  At appt time, Pt not logged on, no evidence she had checked into her online acct re appt.  Video link sent to her phone as a prompt  without reply.  Presumed still in hospital care.  No charge for missed session.  Currently scheduled 4/23 but available to work out earlier f/u for continuity of care on discharge.  EHR also shows a cancelled EGD 4/23, hopefully meaning she has been scoped during her stay and relevant issues are being treated.  Robley Fries, PhD [name], PhD LP Clinical Psychologist, [facility] [address] [place], [place] [address] [phone]

## 2024-03-05 ENCOUNTER — Ambulatory Visit: Payer: Medicaid Other | Admitting: Psychiatry

## 2024-03-05 ENCOUNTER — Ambulatory Visit: Admit: 2024-03-05 | Admitting: Gastroenterology

## 2024-03-05 DIAGNOSIS — Z91199 Patient's noncompliance with other medical treatment and regimen due to unspecified reason: Secondary | ICD-10-CM

## 2024-03-05 DIAGNOSIS — R69 Illness, unspecified: Secondary | ICD-10-CM

## 2024-03-05 DIAGNOSIS — F4312 Post-traumatic stress disorder, chronic: Secondary | ICD-10-CM

## 2024-03-05 SURGERY — EGD (ESOPHAGOGASTRODUODENOSCOPY)
Anesthesia: General
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
## 2024-03-12 ENCOUNTER — Telehealth: Admitting: Adult Health

## 2024-03-13 ENCOUNTER — Ambulatory Visit: Admitting: Adult Health

## 2024-03-13 ENCOUNTER — Ambulatory Visit: Admitting: Psychiatry

## 2024-03-13 DIAGNOSIS — Z0389 Encounter for observation for other suspected diseases and conditions ruled out: Secondary | ICD-10-CM

## 2024-03-13 NOTE — Progress Notes (Signed)
 No charge appointment.

## 2024-03-17 ENCOUNTER — Ambulatory Visit: Admitting: Dietician

## 2024-03-18 ENCOUNTER — Ambulatory Visit: Payer: Medicaid Other | Admitting: Psychiatry

## 2024-03-18 DIAGNOSIS — F332 Major depressive disorder, recurrent severe without psychotic features: Secondary | ICD-10-CM

## 2024-03-18 DIAGNOSIS — Z599 Problem related to housing and economic circumstances, unspecified: Secondary | ICD-10-CM

## 2024-03-18 DIAGNOSIS — F4312 Post-traumatic stress disorder, chronic: Secondary | ICD-10-CM | POA: Diagnosis not present

## 2024-03-18 DIAGNOSIS — G8929 Other chronic pain: Secondary | ICD-10-CM

## 2024-03-18 DIAGNOSIS — R69 Illness, unspecified: Secondary | ICD-10-CM | POA: Diagnosis not present

## 2024-03-18 DIAGNOSIS — F5104 Psychophysiologic insomnia: Secondary | ICD-10-CM
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
## 2024-03-20 ENCOUNTER — Encounter: Payer: Self-pay | Admitting: Adult Health

## 2024-03-20 ENCOUNTER — Telehealth: Admitting: Adult Health

## 2024-03-20 DIAGNOSIS — F331 Major depressive disorder, recurrent, moderate: Secondary | ICD-10-CM

## 2024-03-20 DIAGNOSIS — G4700 Insomnia, unspecified: Secondary | ICD-10-CM

## 2024-03-20 DIAGNOSIS — F411 Generalized anxiety disorder: Secondary | ICD-10-CM

## 2024-03-20 DIAGNOSIS — F4310 Post-traumatic stress disorder, unspecified: Secondary | ICD-10-CM

## 2024-03-20 MED ORDER — DULOXETINE HCL 30 MG PO CPEP: ORAL_CAPSULE | ORAL | 5 refills | Status: DC

## 2024-03-20 MED ORDER — DULOXETINE HCL 60 MG PO CPEP: 120.0000 mg | ORAL_CAPSULE | Freq: Every day | ORAL | 5 refills | Status: DC

## 2024-03-20 MED ORDER — PRAZOSIN HCL 5 MG PO CAPS: 5.0000 mg | ORAL_CAPSULE | Freq: Every day | ORAL | 5 refills | Status: DC

## 2024-03-20 MED ORDER — CLONAZEPAM 1 MG PO TABS: 1.0000 mg | ORAL_TABLET | Freq: Four times a day (QID) | ORAL | 2 refills | Status: DC | PRN

## 2024-03-20 NOTE — Progress Notes (Signed)
 Deanna George [id number] 1966-05-13 58 y.o.  Virtual Visit via Video Note  I connected with pt @ on 03/20/24 at  4:00 PM EDT by a video enabled telemedicine application and verified that I am speaking with the correct person using two identifiers.   I discussed the limitations of evaluation and management by telemedicine and the availability of in person appointments. The patient expressed understanding and agreed to proceed.  I discussed the assessment and treatment plan with the patient. The patient was provided an opportunity to ask questions and all were answered. The patient agreed with the plan and demonstrated an understanding of the instructions.   The patient was advised to call back or seek an in-person evaluation if the symptoms worsen or if the condition fails to improve as anticipated.  I provided 25 minutes of non-face-to-face time during this encounter.  The patient was located at home.  The provider was located at [facility].   Reagan Camera, NP   Subjective:   Patient ID:  Deanna George is a 58 y.o. (DOB 1966/05/17) female.  Chief Complaint: No chief complaint on file.   HPI TAFFI BELGARD presents for follow-up of PTSD, GAD, MDD, insomnia and panic disorder.  Recent 33 day stay at [facility] - now receiving ECT treatments twice a week.  Describes mood today as "better". Pleasant. Reports tearfulness at times. Mood symptoms - reports depression is lower, but is experiencing increased anxiety. Reports irritability - "body tired and fatigued. Reports low interest and motivation - "very low". Reports panic attacks. Reports worry, rumination and over thinking. Reports ongoing situational stressors - health and financial. Reports difficulties completing daily tasks - "I still have my Christmas tree up". Reports chronic pain issues and is trying to manage multiple health issues. Reports mood is variable - "having ups and downs". Stating "I feel like I'm doing  better than I was". Reports taking medications as prescribed and feels like they are beneficial. Also reports ECT is helpful for mood symptoms and plans to continue treatment. Continues to work with Dr. Caroleen Churn. Taking medications as prescribed.  Energy levels lower - "sugar levels up a little". Active, does not have a regular exercise routine with physical limitations.   Reports she is unable to enjoy interests and activities. Single. Lives alone with cat and 3 birds. Has 2 children.  Appetite adequate - poor. Reports weight loss. Current weight - 170 pounds. Reports sleeping difficulties. Averages 6.5 to 8 hours since hospitalization. Reports difficulties with focus and concentration - severe ADHD. Completing some tasks. Managing some aspects of household. Disabled and unable to work. Denies SI or HI.  Denies AH or VH. Denies self harm. Reports smoking OTC THC as needed.     Previous medications: Ritalin and Thorazine - others, Trazadone   Review of Systems:  Review of Systems  Musculoskeletal:  Negative for gait problem.  Neurological:  Negative for tremors.  Psychiatric/Behavioral:         Please refer to HPI    Medications: I have reviewed the patient's current medications.  Current Outpatient Medications  Medication Sig Dispense Refill   albuterol  (VENTOLIN  HFA) 108 (90 Base) MCG/ACT inhaler Inhale 2 puffs into the lungs every 6 (six) hours as needed for wheezing or shortness of breath. 8 g 2   CANNABIDIOL PO Take by mouth at bedtime.     clonazePAM  (KLONOPIN ) 1 MG tablet Take 1 tablet (1 mg total) by mouth 4 (four) times daily as needed for anxiety.  120 tablet 2   cyclobenzaprine  (FLEXERIL ) 10 MG tablet TAKE 1 TABLET BY MOUTH EVERY 8 HOURS AS NEEDED FOR 30 DAYS     dicyclomine  (BENTYL ) 10 MG capsule Take 1 capsule (10 mg total) by mouth 4 (four) times daily -  before meals and at bedtime for 7 days. 28 capsule 0   diphenhydrAMINE  (BENADRYL ) 25 mg capsule Take 25 mg by mouth  every 6 (six) hours as needed for allergies.     DULoxetine  (CYMBALTA ) 30 MG capsule Take one capsule daily - tapering up to the 60mg  daily. 30 capsule 5   DULoxetine  (CYMBALTA ) 60 MG capsule Take 2 capsules (120 mg total) by mouth daily. 60 capsule 5   empagliflozin (JARDIANCE) 25 MG TABS tablet Take 25 mg by mouth daily.     fluticasone  (FLONASE ) 50 MCG/ACT nasal spray Place 2 sprays into both nostrils daily for 7 days. 15.8 mL 0   HYDROcodone -acetaminophen  (NORCO) 5-325 MG tablet Take 1 tablet by mouth every 6 (six) hours as needed for moderate pain. (Patient not taking: Reported on 03/26/2023) 15 tablet 0   hyoscyamine  (LEVSIN SL) 0.125 MG SL tablet Place 0.125 mg under the tongue every 6 (six) hours as needed for cramping.     ibuprofen  (ADVIL ) 800 MG tablet 1 tablet with food or milk as needed     loperamide  (IMODIUM ) 2 MG capsule Take 1 capsule (2 mg total) by mouth as needed for diarrhea or loose stools. 30 capsule 0   losartan  (COZAAR ) 50 MG tablet Take 1 tablet (50 mg total) by mouth in the morning and at bedtime. 60 tablet 11   Melatonin 10 MG CAPS Take 10 mg by mouth at bedtime as needed.      metoprolol  tartrate (LOPRESSOR ) 25 MG tablet TAKE 1 TABLET BY MOUTH TWICE DAILY. (Patient not taking: Reported on 03/26/2023) 60 tablet 11   OLANZapine  (ZYPREXA ) 2.5 MG tablet TAKE 1 TABLET BY MOUTH EVERYDAY AT BEDTIME 90 tablet 0   omeprazole  (PRILOSEC) 40 MG capsule Take 1 capsule (40 mg total) by mouth in the morning and at bedtime. 60 capsule 0   ondansetron  (ZOFRAN ) 4 MG tablet Take 1 tablet (4 mg total) by mouth every 6 (six) hours as needed for nausea or vomiting. 30 tablet 11   ondansetron  (ZOFRAN ) 4 MG tablet Take 1 tablet (4 mg total) by mouth every 6 (six) hours as needed for nausea. 20 tablet 0   prazosin  (MINIPRESS ) 5 MG capsule Take 1 capsule (5 mg total) by mouth at bedtime. 30 capsule 5   predniSONE  (STERAPRED UNI-PAK 21 TAB) 10 MG (21) TBPK tablet As directed on packaging 1 each 0    promethazine  (PHENERGAN ) 25 MG tablet Take 1 tablet (25 mg total) by mouth every 6 (six) hours as needed for nausea or vomiting. 30 tablet 11   propranolol (INDERAL) 20 MG tablet Take 1 tablet by mouth 2 (two) times daily.     rizatriptan  (MAXALT -MLT) 10 MG disintegrating tablet Take 1 tablet (10 mg total) by mouth as needed. May repeat in 2 hours if needed 15 tablet 4   SUMAtriptan  (IMITREX ) 50 MG tablet Take 50 mg by mouth every 2 (two) hours as needed for migraine. May repeat in 2 hours if headache persists or recurs.     SUMAtriptan  (IMITREX ) 6 MG/0.5ML SOLN injection Inject 0.5 mLs (6 mg total) into the skin as needed for migraine. May repeat in 2 hours if headache persists or recurs. 10 vial 4   No current  facility-administered medications for this visit.    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    Situational stress    Urinary tract infection     Family History  Adopted: Yes  Problem Relation Age of Onset   Colon cancer Neg Hx    Pancreatic cancer Neg Hx    Stomach cancer Neg Hx    Rectal cancer Neg Hx     Social History   Socioeconomic History   Marital status: Divorced    Spouse name: Not on file   Number of children: 2   Years of education: Not on file   Highest education level: Not on file  Occupational History   Occupation: student    Comment: graduated 2018  Tobacco Use   Smoking status: Some  Days    Types: Cigarettes   Smokeless tobacco: Never  Vaping Use   Vaping status: Never Used  Substance and Sexual Activity   Alcohol use: Not Currently    Comment: Rare   Drug use: Not Currently   Sexual activity: Not Currently  Other Topics Concern   Not on file  Social History Narrative   Not on file   Social Drivers of Health   Financial Resource Strain: High Risk (02/06/2024)   Received from [facility]   Overall Financial Resource Strain (CARDIA)    Difficulty of Paying Living Expenses: Very hard  Food Insecurity: Food Insecurity Present (02/07/2024)   Received from [facility]   Hunger Vital Sign    Worried About Running Out of Food in the Last Year: Sometimes true    Ran Out of Food in the Last Year: Sometimes true  Transportation Needs: No Transportation Needs (02/07/2024)   Received from [facility]   PRAPARE - Transportation    In the past 12 months, has lack of transportation kept you from medical appointments or from getting medications?: No    Lack of Transportation (Non-Medical): No  Physical Activity: Inactive (08/01/2023)   Received from [facility]   Exercise Vital Sign    Days of Exercise per Week: 0 days    Minutes of Exercise per Session: 0 min  Stress: Stress Concern Present (08/07/2023)   Received from [facility] - Occupational Stress Questionnaire    Feeling of Stress : Very much  Social Connections: Socially Isolated (08/01/2023)   Received from [facility]   Social Connection and Isolation Panel [NHANES]    Frequency of Communication with Friends and Family: Once a week    Frequency of Social Gatherings with Friends and Family: Never    Attends Religious Services: Never    Database administrator or Organizations: No    Attends Engineer, structural:  Never    Marital Status: Divorced   Catering manager Violence: Not At Risk (11/17/2021)   Received from [facility]   Humiliation, Afraid, Rape, and Kick questionnaire    Fear of Current or Ex-Partner: No    Emotionally Abused: No    Physically Abused: No    Sexually Abused: No    Past Medical History, Surgical history, Social history, and Family history were reviewed and updated as appropriate.   Please see review of systems for further details on the patient's review from today.   Objective:   Physical Exam:  There were no vitals taken for this visit.  Physical Exam Constitutional:      General: She is not in acute distress. Musculoskeletal:        General: No deformity.  Neurological:     Mental Status: She is alert and oriented to person, place, and time.     Coordination: Coordination normal.  Psychiatric:        Attention and Perception: Attention and perception normal. She does not perceive auditory or visual hallucinations.        Mood and Affect: Mood normal. Mood is not anxious or depressed. Affect is not labile, blunt, angry or inappropriate.        Speech: Speech normal.        Behavior: Behavior normal.        Thought Content: Thought content normal. Thought content is not paranoid or delusional. Thought content does not include homicidal or suicidal ideation. Thought content does not include homicidal or suicidal plan.        Cognition and Memory: Cognition and memory normal.        Judgment: Judgment normal.     Comments: Insight intact     Lab Review:     Component Value Date/Time   NA 135 05/16/2021 2351   NA 140 03/27/2017 1543   K 4.2 05/16/2021 2351   CL 105 05/16/2021 2351   CO2 20 (L) 05/16/2021 2351   GLUCOSE 134 (H) 05/16/2021 2351   BUN 18 05/16/2021 2351   BUN 6 03/27/2017 1543   CREATININE 1.20 (H) 08/20/2023 1639   CREATININE 0.95 09/28/2017 1847   CALCIUM  9.4 05/16/2021 2351   PROT 7.9 05/17/2021 0314   PROT 7.2 03/27/2017 1543   ALBUMIN 4.3  05/17/2021 0314   ALBUMIN 4.5 03/27/2017 1543   AST 16 05/17/2021 0314   ALT 12 05/17/2021 0314   ALKPHOS 83 05/17/2021 0314   BILITOT 0.6 05/17/2021 0314   BILITOT 0.2 03/27/2017 1543   GFRNONAA >60 05/16/2021 2351   GFRNONAA 70 09/28/2017 1847   GFRAA 82 09/28/2017 1847       Component Value Date/Time   WBC 11.3 (H) 05/16/2021 2351   RBC 4.30 05/16/2021 2351   HGB 11.8 (L) 05/16/2021 2351   HGB 12.5 03/27/2017 1543   HCT 35.1 (L) 05/16/2021 2351   HCT 39.0 03/27/2017 1543   PLT 209 05/16/2021 2351   PLT 248 03/27/2017 1543   MCV 81.6 05/16/2021 2351   MCV 87 03/27/2017 1543   MCH 27.4 05/16/2021 2351   MCHC 33.6 05/16/2021 2351   RDW 14.8 05/16/2021 2351   RDW 13.6 03/27/2017 1543   LYMPHSABS 1.7 10/03/2020 0511   LYMPHSABS 1.7 03/27/2017 1543   MONOABS 0.3 10/03/2020 0511   EOSABS 0.2 10/03/2020 0511   EOSABS 0.1 03/27/2017 1543   BASOSABS 0.0 10/03/2020 0511   BASOSABS 0.0 03/27/2017 1543    No results  found for: "POCLITH", "LITHIUM"   No results found for: "PHENYTOIN", "PHENOBARB", "VALPROATE", "CBMZ"   .res Assessment: Plan:    Plan:  Added at [facility]: Prozac 20mg  daily Hydroxyzine 25mg  - 1 to 2 capsules daily  Propranolol through PCP  Cymbalta  - 90mg  - reports GI issues. Zyprexa  2.5mg  to 7.5mg  at hs - increased in hospital Prazosin  5mg  at hs - nightmares  Clonazepam  1mg  4 times daily - ODT from tablet   Continue therapy with Andy Mitchum - meeting every 2 weeks.  RTC 4 weeks  25 minutes spent dedicated to the care of this patient on the date of this encounter to include pre-visit review of records, ordering of medication, post visit documentation, and face-to-face time with the patient discussing PTSD, GAD, MDD, insomnia and panic disorder. Discussed continuing current medication regimen.  Patient advised to contact office with any questions, adverse effects, or acute worsening in signs and symptoms.  Discussed potential benefits, risk, and side  effects of benzodiazepines to include potential risk of tolerance and dependence, as well as possible drowsiness.  Advised patient not to drive if experiencing drowsiness and to take lowest possible effective dose to minimize risk of dependence and tolerance.  Discussed potential benefits, risks, and side effects of stimulants with patient to include increased heart rate, palpitations, insomnia, increased anxiety, increased irritability, or decreased appetite.  Instructed patient to contact office if experiencing any significant tolerability issues.  Diagnoses and all orders for this visit:  Major depressive disorder, recurrent episode, moderate (HCC)  Generalized anxiety disorder -     DULoxetine  (CYMBALTA ) 30 MG capsule; Take one capsule daily - tapering up to the 60mg  daily. -     DULoxetine  (CYMBALTA ) 60 MG capsule; Take 2 capsules (120 mg total) by mouth daily. -     clonazePAM  (KLONOPIN ) 1 MG tablet; Take 1 tablet (1 mg total) by mouth 4 (four) times daily as needed for anxiety.  PTSD (post-traumatic stress disorder) -     DULoxetine  (CYMBALTA ) 60 MG capsule; Take 2 capsules (120 mg total) by mouth daily. -     prazosin  (MINIPRESS ) 5 MG capsule; Take 1 capsule (5 mg total) by mouth at bedtime.  Insomnia, unspecified type     Please see After Visit Summary for patient specific instructions.  Future Appointments  Date Time Provider Department Center  03/25/2024  1:00 PM Maretta Shaper, PhD [facility] None  04/30/2024  2:30 PM Kelvin Pattee, RD [facility] None  05/01/2024  2:00 PM Maretta Shaper, PhD [facility] None  05/08/2024  3:00 PM Maretta Shaper, PhD [facility] None  05/23/2024  3:00 PM Maretta Shaper, PhD [facility] None  06/04/2024  7:30 AM [facility] None  06/05/2024  2:00 PM Mitchum, Porfirio Bristol, PhD [facility] None    No orders of the defined types were placed in this encounter.     -------------------------------

## 2024-03-25 ENCOUNTER — Ambulatory Visit: Admitting: Psychiatry

## 2024-03-25 DIAGNOSIS — F4001 Agoraphobia with panic disorder: Secondary | ICD-10-CM | POA: Diagnosis not present

## 2024-03-25 DIAGNOSIS — G8929 Other chronic pain: Secondary | ICD-10-CM

## 2024-03-25 DIAGNOSIS — F5104 Psychophysiologic insomnia: Secondary | ICD-10-CM

## 2024-03-25 DIAGNOSIS — F331 Major depressive disorder, recurrent, moderate: Secondary | ICD-10-CM | POA: Diagnosis not present

## 2024-03-25 DIAGNOSIS — F17211 Nicotine dependence, cigarettes, in remission: Secondary | ICD-10-CM

## 2024-03-25 DIAGNOSIS — F4312 Post-traumatic stress disorder, chronic: Secondary | ICD-10-CM
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
## 2024-04-02 ENCOUNTER — Ambulatory Visit: Admitting: Psychiatry

## 2024-04-02 DIAGNOSIS — F4001 Agoraphobia with panic disorder: Secondary | ICD-10-CM

## 2024-04-02 DIAGNOSIS — F5104 Psychophysiologic insomnia: Secondary | ICD-10-CM | POA: Diagnosis not present

## 2024-04-02 DIAGNOSIS — Z599 Problem related to housing and economic circumstances, unspecified: Secondary | ICD-10-CM

## 2024-04-02 DIAGNOSIS — F4312 Post-traumatic stress disorder, chronic: Secondary | ICD-10-CM

## 2024-04-02 DIAGNOSIS — F331 Major depressive disorder, recurrent, moderate: Secondary | ICD-10-CM

## 2024-04-02 DIAGNOSIS — G8929 Other chronic pain: Secondary | ICD-10-CM
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
## 2024-04-11 ENCOUNTER — Telehealth: Payer: Self-pay | Admitting: Adult Health

## 2024-04-11 NOTE — Telephone Encounter (Signed)
 Please see message and advise. Will send Rx as appropriate.  I tried to see if I could find this information in hospital notes and I could not.

## 2024-04-11 NOTE — Telephone Encounter (Signed)
 Tried calling patient to see what the frequency was of both of these medications. Her mailbox is full. Sent MyChart message to call the office.

## 2024-04-11 NOTE — Telephone Encounter (Signed)
 Pt called asking for a refill on two  medicaitons that she was given at the hospital. One is hydroxine 25 mg and the other is fluoxetine 20 mg. Pharmacy is [facility] on [address] in [place]

## 2024-04-14 NOTE — Telephone Encounter (Signed)
 You prescribe olanzapine  2.5 mg for patient. She said when she was inpatient she was prescribed 7.5 mg for 30 days. She is asking if you will increase it to 7.5.  She also asked for hydroxyzine 25 mg 1-2 cap BID and fluoxetine 20 mg, which you previously approved.

## 2024-04-15 NOTE — Telephone Encounter (Signed)
Has appt 6/6

## 2024-04-17 ENCOUNTER — Telehealth: Admitting: Adult Health

## 2024-04-18 ENCOUNTER — Telehealth: Admitting: Adult Health

## 2024-04-18 ENCOUNTER — Encounter: Payer: Self-pay | Admitting: Adult Health

## 2024-04-18 DIAGNOSIS — F331 Major depressive disorder, recurrent, moderate: Secondary | ICD-10-CM

## 2024-04-18 DIAGNOSIS — G4700 Insomnia, unspecified: Secondary | ICD-10-CM | POA: Diagnosis not present

## 2024-04-18 DIAGNOSIS — F4310 Post-traumatic stress disorder, unspecified: Secondary | ICD-10-CM | POA: Diagnosis not present

## 2024-04-18 DIAGNOSIS — F411 Generalized anxiety disorder: Secondary | ICD-10-CM | POA: Diagnosis not present

## 2024-04-18 DIAGNOSIS — F4001 Agoraphobia with panic disorder: Secondary | ICD-10-CM

## 2024-04-18 DIAGNOSIS — F410 Panic disorder [episodic paroxysmal anxiety] without agoraphobia: Secondary | ICD-10-CM

## 2024-04-18 DIAGNOSIS — F4312 Post-traumatic stress disorder, chronic: Secondary | ICD-10-CM

## 2024-04-18 DIAGNOSIS — F5104 Psychophysiologic insomnia: Secondary | ICD-10-CM

## 2024-04-18 MED ORDER — OLANZAPINE 7.5 MG PO TABS: 7.5000 mg | ORAL_TABLET | Freq: Every day | ORAL | 1 refills | Status: DC

## 2024-04-18 MED ORDER — FLUOXETINE HCL 20 MG PO CAPS: 20.0000 mg | ORAL_CAPSULE | Freq: Every day | ORAL | 1 refills | Status: AC

## 2024-04-18 MED ORDER — HYDROXYZINE HCL 25 MG PO TABS: ORAL_TABLET | ORAL | 1 refills | Status: AC

## 2024-04-18 NOTE — Progress Notes (Signed)
 Deanna George [id number] [birth year] 58 y.o.  Virtual Visit via Video Note  I connected with pt @ on 04/18/24 at  2:00 PM EDT by a video enabled telemedicine application and verified that I am speaking with the correct person using two identifiers.   I discussed the limitations of evaluation and management by telemedicine and the availability of in person appointments. The patient expressed understanding and agreed to proceed.  I discussed the assessment and treatment plan with the patient. The patient was provided an opportunity to ask questions and all were answered. The patient agreed with the plan and demonstrated an understanding of the instructions.   The patient was advised to call back or seek an in-person evaluation if the symptoms worsen or if the condition fails to improve as anticipated.  I provided 25 minutes of non-face-to-face time during this encounter.  The patient was located at home.  The provider was located at [facility].   Reagan Camera, NP   Subjective:   Patient ID:  Deanna George is a 58 y.o. (DOB 11-Sep-1966) female.  Chief Complaint: No chief complaint on file.   HPI Deanna George presents for follow-up of PTSD, GAD, MDD, insomnia and panic disorder.  Recent 33 day stay at [facility] - now receiving ECT treatments twice a week.  Describes mood today as "a little better". Pleasant. Reports tearfulness at times. Mood symptoms - reports depression and anxiety". Stating "I will probably always have depression and anxiety with my childhood the way it was". Reports irritability - "associated with my ADHD". Reports low interest and motivation - "very little". Reports panic attacks - "12 to 20 something a day". Reports worry, rumination and over thinking - "I get paralyzed with it". Reports ongoing situational stressors - health and financial. Reports difficulties completing daily tasks. Reports chronic pain issues and manage multiple health issues.  Reports mood is variable - "having highs and lows". Stating "I feel like I'm doing better than I was before I went into the hospital - now back in her every day reality". Reports taking medications as prescribed and feels like they are beneficial. Continues to work with Dr. Caroleen Churn. Taking medications as prescribed.  Energy levels lower. Unable to establish a regular exercise routine with physical disabilities.  Active, does not have a regular exercise routine with physical limitations.   Reports she is unable to enjoy interests and activities. Single. Lives alone with cat and 3 birds. Has 2 children.  Appetite adequate - "finicky". Reports weight stable. Current weight - 170 pounds. Reports sleeping difficulties. Averages 6 to 8 hours since hospitalization. Reports difficulties with focus and concentration - severe ADHD. Completing some tasks. Managing some aspects of household. Disabled and unable to work. Denies SI or HI.  Denies AH or VH. Denies self harm. Reports smoking OTC THC as needed for nausea.     Previous medications: Ritalin and Thorazine - others, Trazadone   Review of Systems:  Review of Systems  Musculoskeletal:  Negative for gait problem.  Neurological:  Negative for tremors.  Psychiatric/Behavioral:         Please refer to HPI    Medications: I have reviewed the patient's current medications.  Current Outpatient Medications  Medication Sig Dispense Refill   albuterol  (VENTOLIN  HFA) 108 (90 Base) MCG/ACT inhaler Inhale 2 puffs into the lungs every 6 (six) hours as needed for wheezing or shortness of breath. 8 g 2   CANNABIDIOL PO Take by mouth at bedtime.  clonazePAM  (KLONOPIN ) 1 MG tablet Take 1 tablet (1 mg total) by mouth 4 (four) times daily as needed for anxiety. 120 tablet 2   cyclobenzaprine  (FLEXERIL ) 10 MG tablet TAKE 1 TABLET BY MOUTH EVERY 8 HOURS AS NEEDED FOR 30 DAYS     dicyclomine  (BENTYL ) 10 MG capsule Take 1 capsule (10 mg total) by mouth 4 (four)  times daily -  before meals and at bedtime for 7 days. 28 capsule 0   diphenhydrAMINE  (BENADRYL ) 25 mg capsule Take 25 mg by mouth every 6 (six) hours as needed for allergies.     DULoxetine  (CYMBALTA ) 30 MG capsule Take one capsule daily - tapering up to the 60mg  daily. 30 capsule 5   DULoxetine  (CYMBALTA ) 60 MG capsule Take 2 capsules (120 mg total) by mouth daily. 60 capsule 5   empagliflozin (JARDIANCE) 25 MG TABS tablet Take 25 mg by mouth daily.     fluticasone  (FLONASE ) 50 MCG/ACT nasal spray Place 2 sprays into both nostrils daily for 7 days. 15.8 mL 0   HYDROcodone -acetaminophen  (NORCO) 5-325 MG tablet Take 1 tablet by mouth every 6 (six) hours as needed for moderate pain. (Patient not taking: Reported on 03/26/2023) 15 tablet 0   hyoscyamine  (LEVSIN SL) 0.125 MG SL tablet Place 0.125 mg under the tongue every 6 (six) hours as needed for cramping.     ibuprofen  (ADVIL ) 800 MG tablet 1 tablet with food or milk as needed     loperamide  (IMODIUM ) 2 MG capsule Take 1 capsule (2 mg total) by mouth as needed for diarrhea or loose stools. 30 capsule 0   losartan  (COZAAR ) 50 MG tablet Take 1 tablet (50 mg total) by mouth in the morning and at bedtime. 60 tablet 11   Melatonin 10 MG CAPS Take 10 mg by mouth at bedtime as needed.      metoprolol  tartrate (LOPRESSOR ) 25 MG tablet TAKE 1 TABLET BY MOUTH TWICE DAILY. (Patient not taking: Reported on 03/26/2023) 60 tablet 11   OLANZapine  (ZYPREXA ) 2.5 MG tablet TAKE 1 TABLET BY MOUTH EVERYDAY AT BEDTIME 90 tablet 0   omeprazole  (PRILOSEC) 40 MG capsule Take 1 capsule (40 mg total) by mouth in the morning and at bedtime. 60 capsule 0   ondansetron  (ZOFRAN ) 4 MG tablet Take 1 tablet (4 mg total) by mouth every 6 (six) hours as needed for nausea or vomiting. 30 tablet 11   ondansetron  (ZOFRAN ) 4 MG tablet Take 1 tablet (4 mg total) by mouth every 6 (six) hours as needed for nausea. 20 tablet 0   prazosin  (MINIPRESS ) 5 MG capsule Take 1 capsule (5 mg total) by  mouth at bedtime. 30 capsule 5   predniSONE  (STERAPRED UNI-PAK 21 TAB) 10 MG (21) TBPK tablet As directed on packaging 1 each 0   promethazine  (PHENERGAN ) 25 MG tablet Take 1 tablet (25 mg total) by mouth every 6 (six) hours as needed for nausea or vomiting. 30 tablet 11   propranolol (INDERAL) 20 MG tablet Take 1 tablet by mouth 2 (two) times daily.     rizatriptan  (MAXALT -MLT) 10 MG disintegrating tablet Take 1 tablet (10 mg total) by mouth as needed. May repeat in 2 hours if needed 15 tablet 4   SUMAtriptan  (IMITREX ) 50 MG tablet Take 50 mg by mouth every 2 (two) hours as needed for migraine. May repeat in 2 hours if headache persists or recurs.     SUMAtriptan  (IMITREX ) 6 MG/0.5ML SOLN injection Inject 0.5 mLs (6 mg total) into the skin  as needed for migraine. May repeat in 2 hours if headache persists or recurs. 10 vial 4   No current facility-administered medications for this visit.    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    Situational stress    Urinary tract infection     Family History  Adopted: Yes  Problem Relation Age of Onset   Colon cancer Neg Hx    Pancreatic cancer Neg Hx    Stomach cancer Neg Hx    Rectal cancer Neg Hx     Social History   Socioeconomic History   Marital status: Divorced    Spouse name: Not on file   Number of children: 2   Years of education: Not on file   Highest education  level: Not on file  Occupational History   Occupation: student    Comment: graduated 2018  Tobacco Use   Smoking status: Some Days    Types: Cigarettes   Smokeless tobacco: Never  Vaping Use   Vaping status: Never Used  Substance and Sexual Activity   Alcohol use: Not Currently    Comment: Rare   Drug use: Not Currently   Sexual activity: Not Currently  Other Topics Concern   Not on file  Social History Narrative   Not on file   Social Drivers of Health   Financial Resource Strain: High Risk (02/06/2024)   Received from [facility]   Overall Financial Resource Strain (CARDIA)    Difficulty of Paying Living Expenses: Very hard  Food Insecurity: Food Insecurity Present (02/07/2024)   Received from [facility]   Hunger Vital Sign    Worried About Running Out of Food in the Last Year: Sometimes true    Ran Out of Food in the Last Year: Sometimes true  Transportation Needs: No Transportation Needs (02/07/2024)   Received from [facility]   PRAPARE - Transportation    In the past 12 months, has lack of transportation kept you from medical appointments or from getting medications?: No    Lack of Transportation (Non-Medical): No  Physical Activity: Inactive (08/01/2023)   Received from [facility]   Exercise Vital Sign    Days of Exercise per Week: 0 days    Minutes of Exercise per Session: 0 min  Stress: Stress Concern Present (08/07/2023)   Received from [facility] - Occupational Stress Questionnaire    Feeling of Stress : Very much  Social Connections: Socially Isolated (08/01/2023)   Received from [facility]   Social Connection and Isolation Panel [NHANES]    Frequency of Communication with Friends and Family: Once a week    Frequency of Social Gatherings with Friends and Family: Never    Attends  Religious Services: Never     Active Member of Clubs or Organizations: No    Attends Banker Meetings: Never    Marital Status: Divorced  Catering manager Violence: Not At Risk (11/17/2021)   Received from [facility]   Humiliation, Afraid, Rape, and Kick questionnaire    Fear of Current or Ex-Partner: No    Emotionally Abused: No    Physically Abused: No    Sexually Abused: No    Past Medical History, Surgical history, Social history, and Family history were reviewed and updated as appropriate.   Please see review of systems for further details on the patient's review from today.   Objective:   Physical Exam:  There were no vitals taken for this visit.  Physical Exam Constitutional:      General: She is not in acute distress. Musculoskeletal:        General: No deformity.  Neurological:     Mental Status: She is alert and oriented to person, place, and time.     Coordination: Coordination normal.  Psychiatric:        Attention and Perception: Attention and perception normal. She does not perceive auditory or visual hallucinations.        Mood and Affect: Mood is anxious and depressed. Affect is not labile, blunt, angry or inappropriate.        Speech: Speech normal.        Behavior: Behavior normal.        Thought Content: Thought content normal. Thought content is not paranoid or delusional. Thought content does not include homicidal or suicidal ideation. Thought content does not include homicidal or suicidal plan.        Cognition and Memory: Cognition and memory normal.        Judgment: Judgment normal.     Comments: Insight intact     Lab Review:     Component Value Date/Time   NA 135 05/16/2021 2351   NA 140 03/27/2017 1543   K 4.2 05/16/2021 2351   CL 105 05/16/2021 2351   CO2 20 (L) 05/16/2021 2351   GLUCOSE 134 (H) 05/16/2021 2351   BUN 18 05/16/2021 2351   BUN 6 03/27/2017 1543   CREATININE 1.20 (H) 08/20/2023 1639   CREATININE 0.95 09/28/2017 1847    CALCIUM  9.4 05/16/2021 2351   PROT 7.9 05/17/2021 0314   PROT 7.2 03/27/2017 1543   ALBUMIN 4.3 05/17/2021 0314   ALBUMIN 4.5 03/27/2017 1543   AST 16 05/17/2021 0314   ALT 12 05/17/2021 0314   ALKPHOS 83 05/17/2021 0314   BILITOT 0.6 05/17/2021 0314   BILITOT 0.2 03/27/2017 1543   GFRNONAA >60 05/16/2021 2351   GFRNONAA 70 09/28/2017 1847   GFRAA 82 09/28/2017 1847       Component Value Date/Time   WBC 11.3 (H) 05/16/2021 2351   RBC 4.30 05/16/2021 2351   HGB 11.8 (L) 05/16/2021 2351   HGB 12.5 03/27/2017 1543   HCT 35.1 (L) 05/16/2021 2351   HCT 39.0 03/27/2017 1543   PLT 209 05/16/2021 2351   PLT 248 03/27/2017 1543   MCV 81.6 05/16/2021 2351   MCV 87 03/27/2017 1543   MCH 27.4 05/16/2021 2351   MCHC 33.6 05/16/2021 2351   RDW 14.8 05/16/2021 2351   RDW 13.6 03/27/2017 1543   LYMPHSABS 1.7 10/03/2020 0511   LYMPHSABS 1.7 03/27/2017 1543   MONOABS 0.3 10/03/2020 0511   EOSABS 0.2 10/03/2020 0511   EOSABS 0.1 03/27/2017  1543   BASOSABS 0.0 10/03/2020 0511   BASOSABS 0.0 03/27/2017 1543    No results found for: "POCLITH", "LITHIUM"   No results found for: "PHENYTOIN", "PHENOBARB", "VALPROATE", "CBMZ"   .res Assessment: Plan:    Plan:  Added at [facility]: Prozac 20mg  daily Hydroxyzine 25mg  - 1 to 2 capsules daily  Propranolol through PCP  Cymbalta  - 90mg  - reports GI issues. Zyprexa  7.5mg  at hs - increased in hospital Prazosin  5mg  at hs - nightmares  Clonazepam  1mg  4 times daily - ODT from tablet   Add from inpatient stay: Hydroxyzine 25mg  BID Prozac 20mg  daily   Continue therapy with [name] - meeting every 2 weeks.  RTC 4 weeks  25 minutes spent dedicated to the care of this patient on the date of this encounter to include pre-visit review of records, ordering of medication, post visit documentation, and face-to-face time with the patient discussing PTSD, GAD, MDD, insomnia and panic disorder. Discussed continuing current medication  regimen.  Patient advised to contact office with any questions, adverse effects, or acute worsening in signs and symptoms.  Discussed potential benefits, risk, and side effects of benzodiazepines to include potential risk of tolerance and dependence, as well as possible drowsiness.  Advised patient not to drive if experiencing drowsiness and to take lowest possible effective dose to minimize risk of dependence and tolerance.  Discussed potential benefits, risks, and side effects of stimulants with patient to include increased heart rate, palpitations, insomnia, increased anxiety, increased irritability, or decreased appetite.  Instructed patient to contact office if experiencing any significant tolerability issues.  There are no diagnoses linked to this encounter.   Please see After Visit Summary for patient specific instructions.  Future Appointments  Date Time Provider Department Center  04/30/2024  2:30 PM Kelvin Pattee, RD [facility] None  05/01/2024  2:00 PM Maretta Shaper, PhD [facility] None  05/08/2024  3:00 PM Maretta Shaper, PhD [facility] None  05/23/2024  3:00 PM Maretta Shaper, PhD [facility] None  06/04/2024  7:30 AM [facility] None  06/05/2024  2:00 PM Maretta Shaper, PhD [facility] None  06/10/2024  3:00 PM Maretta Shaper, PhD [facility] None  06/23/2024  2:00 PM Maretta Shaper, PhD [facility] None    No orders of the defined types were placed in this encounter.     -------------------------------

## 2024-04-30 ENCOUNTER — Encounter: Admitting: Dietician

## 2024-05-01 ENCOUNTER — Ambulatory Visit: Admitting: Psychiatry

## 2024-05-01 DIAGNOSIS — F331 Major depressive disorder, recurrent, moderate: Secondary | ICD-10-CM

## 2024-05-01 DIAGNOSIS — F4001 Agoraphobia with panic disorder: Secondary | ICD-10-CM

## 2024-05-01 DIAGNOSIS — G8929 Other chronic pain: Secondary | ICD-10-CM

## 2024-05-01 DIAGNOSIS — F4312 Post-traumatic stress disorder, chronic: Secondary | ICD-10-CM

## 2024-05-01 DIAGNOSIS — Z599 Problem related to housing and economic circumstances, unspecified: Secondary | ICD-10-CM

## 2024-05-01 DIAGNOSIS — F5104 Psychophysiologic insomnia: Secondary | ICD-10-CM
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
## 2024-05-05 ENCOUNTER — Telehealth: Payer: Self-pay | Admitting: Psychiatry

## 2024-05-05 NOTE — Telephone Encounter (Signed)
 Admin note for non-billed service  Patient ID: Deanna George  MRN: [id number] DATE: 05/05/2024  Phone inquiry/request this afternoon from Summer to consult about next steps in her mother's care.  Call to listed number [phone] did not pick up and did not leave option for voicemail.  With no apparent emergency, can make available tomorrow, and/or discuss with Pt at scheduled appt 6/26.  Lamar Kendall, PhD [name], PhD LP Clinical Psychologist, [facility] [address], [address] [place], [place] [address] [phone]

## 2024-05-06 NOTE — Telephone Encounter (Signed)
 Discovered from staff, erroneous phone number, should be [phone].  Can return call tomorrow and follow up with Pt Thursday.

## 2024-05-08 ENCOUNTER — Ambulatory Visit (INDEPENDENT_AMBULATORY_CARE_PROVIDER_SITE_OTHER): Admitting: Psychiatry

## 2024-05-08 DIAGNOSIS — F4312 Post-traumatic stress disorder, chronic: Secondary | ICD-10-CM

## 2024-05-08 DIAGNOSIS — G8929 Other chronic pain: Secondary | ICD-10-CM

## 2024-05-08 DIAGNOSIS — F4001 Agoraphobia with panic disorder: Secondary | ICD-10-CM | POA: Diagnosis not present

## 2024-05-08 DIAGNOSIS — F447 Conversion disorder with mixed symptom presentation: Secondary | ICD-10-CM

## 2024-05-08 DIAGNOSIS — F5104 Psychophysiologic insomnia: Secondary | ICD-10-CM

## 2024-05-08 DIAGNOSIS — Z599 Problem related to housing and economic circumstances, unspecified: Secondary | ICD-10-CM

## 2024-05-08 DIAGNOSIS — F331 Major depressive disorder, recurrent, moderate: Secondary | ICD-10-CM

## 2024-05-08 DIAGNOSIS — F17211 Nicotine dependence, cigarettes, in remission: Secondary | ICD-10-CM
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
## 2024-05-13 ENCOUNTER — Telehealth: Admitting: Adult Health

## 2024-05-23 ENCOUNTER — Ambulatory Visit: Admitting: Psychiatry

## 2024-05-23 DIAGNOSIS — F4001 Agoraphobia with panic disorder: Secondary | ICD-10-CM | POA: Diagnosis not present

## 2024-05-23 DIAGNOSIS — F5104 Psychophysiologic insomnia: Secondary | ICD-10-CM | POA: Diagnosis not present

## 2024-05-23 DIAGNOSIS — F4312 Post-traumatic stress disorder, chronic: Secondary | ICD-10-CM

## 2024-05-23 DIAGNOSIS — G8929 Other chronic pain: Secondary | ICD-10-CM

## 2024-05-23 DIAGNOSIS — F331 Major depressive disorder, recurrent, moderate: Secondary | ICD-10-CM | POA: Diagnosis not present

## 2024-05-23 DIAGNOSIS — F17211 Nicotine dependence, cigarettes, in remission: Secondary | ICD-10-CM

## 2024-05-23 DIAGNOSIS — Z599 Problem related to housing and economic circumstances, unspecified: Secondary | ICD-10-CM

## 2024-05-23 DIAGNOSIS — F447 Conversion disorder with mixed symptom presentation: Secondary | ICD-10-CM

## 2024-05-23 NOTE — Progress Notes (Signed)
 Psychotherapy Progress Note [facility] [name], PhD LP  Patient ID: Deanna George)    MRN: [id number] Therapy format: Individual psychotherapy Date: 05/23/2024      Start: 3:16p     Stop: 4:05p     Time Spent: 49 min Location: Telehealth visit -- I connected with this patient by an approved telecommunication method (video), with her informed consent, and verifying identity and patient privacy.  I was located at my office and patient at her home.  As needed, we discussed the limitations, risks, and security and privacy concerns associated with telehealth service, including the availability and conditions which currently govern in-person appointments and the possibility that 3rd-party payment may not be fully guaranteed and she may be responsible for charges.  After she indicated understanding, we proceeded with the session.  Also discussed treatment planning, as needed, including ongoing verbal agreement with the plan, the opportunity to ask and answer all questions, her demonstrated understanding of instructions, and her readiness to call the office should symptoms worsen or she feels she is in a crisis state and needs more immediate and tangible assistance.   Session narrative (presenting needs, interim history, self-report of stressors and symptoms, applications of prior therapy, status changes, and interventions made in session) Has found out about [address], and [facility].  Not sure she can do either one, not sure about affordability or transportation, and her property is a quandary if she goes into residential.  Discussed terms.  Last week had a freak-out about news of federal Medicaid cuts, called off her psychiatry appt, then had an emergency ECT treatment to interrupt the obsessing that ensued.  Realizes she was coursing into hysteria, and it really is hard sometimes to resist.  Regular ECT yesterday.  Says plans are working out for the kids to tag  team getting her to and from ECT.  Affirmed working out arrangements.  The panic attack prevented not only appt with psychiatry, but med review with CMA that had been planned, and the chance to talk with psychiatry about her interest in rescue medication for panic.    Still wants to approach psychiatry about something to be more of a panic attack stopper.  Admittedly has gone off olanzapine , still, with c/o facial swelling, oddly on left side much more.  Encouraged to doubt her paranoia and allow olanzapine  on a regular basis.  Much more likely her facial swelling is physiologic somehow.    Side benefit of seeing [name] more lately than she has in 8 or 9 years.  Nourishing to interact more.  He is normally quiet anyway, an Art gallery manager, but reassuring to have him willing to be involved in her transportation and care.  Can still worry about her housing security -- landlord had a pacemaker put in, still conceivable his kids could decide to stop offering trailer lots, and so much more that government changes to social support programs could do to wreck her living situation.  Encouraged to keep faith, recognize that her case would be unlikely to be turned back and she will have ardent support if needed.  Meanwhile, important to follow through with treatment plan as articulated from hospital, to build coping skills, figure out intensive therapy option and DBT.  If truly desired, she can look into residential living arrangements, but should go further pursuing DBT programs as mentioned before.  Therapeutic modalities: Cognitive Behavioral Therapy, Solution-Oriented/Positive Psychology, and Ego-Supportive  Mental Status/Observations:  Appearance:   Casual  Behavior:  Appropriate  Motor:  Normal  Speech/Language:   Clear and Coherent  Affect:  Appropriate  Mood:  anxious and depressed  Thought process:  Some flight  Thought content:    Rumination  Sensory/Perceptual disturbances:    WNL  Orientation:   Fully oriented  Attention:  Good    Concentration:  Fair  Memory:  WNL  Insight:    Fair  Judgment:   Fair  Impulse Control:  Good   Risk Assessment: Danger to Self: No Self-injurious Behavior: No Danger to Others: No Physical Aggression / Violence: No Duty to Warn: No Access to Firearms a concern: No  Assessment of progress:  stabilized  Diagnosis:   ICD-10-CM   1. Major depressive disorder, recurrent episode, moderate (HCC)  F33.1     2. Chronic post-traumatic stress disorder (PTSD) with panic attacks and generalized anxiety  F43.12     3. Panic disorder with agoraphobia  F40.01     4. 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 Other chronic pain - multiple causes incl fibromyalgia  G89.29     6. Financial difficulties  Z59.9     7. Functional neurological symptom disorder with mixed symptoms  F44.7     8. Cigarette nicotine dependence in remission  F17.211      Plan:  Continuing safety and recovery plan -- Ongoing pledge of no harm, will use crisis call service or present to hospital if unable to guarantee safety.  Recognize SI as a way of getting her own attention on feeling overwhelmed and prioritize actions to mitigate stress, find support, address material issues in health and life administration.  Ongoing availability on wait list for earlier appts.  As a complex care patient, best that she obtain referral to nurse or social worker in one or the other health system (either [facility] or [facility] based on her medical issues).  Should involve Medicaid case manager and investigate residential and DBT options. Health -- Keep up with recommended referrals and assessments, treatments for pain, kidney care, and GI distress.  Recommend common vitamin supplements if able for neurological health, including B complex and D.  Advice against smoking and sodas for general health, kidney protection, and gastric irritation. Anxiety/panic management and sleep -- Pursue/maintain  appropriate sleep medication, refrain from up-dosing and stacking because it just won't work, it will only tease her into being more anxious.  Practice allowing sleep and consciously settling for doing all she can today but rest for the next.  As needed, self-affirm her disability inquiry is only to direct next actions updating her claim, not a prelude to putting her on the street, and if further info is needed, we will address it in turn.  Where possible, continue to practice meditation and calming skills at interest, to proficiency in reducing experienced distress.   Trauma -- Practice acknowledging when intrusive memories occur, acknowledging horrors that happen, and self-validating how she made it out.  Practice acknowledging to loved ones when a moment is shadowed by bad memory and she may need relief. Relationship with adult children -- Pay close attention to open, trusting, revealing behavior and take heart.  Actively dispute catastrophic ideas of what will happen to Summer, or their relationship, and recognize PTSD imagination in progress, dispute.  Reaffirm it is developmentally Summer's business to work out for herself what her boundaries will be with peers and father.  Practice trust that Summer will use her independent mind to figure it out  without wrecking her life or taking [name]'s perceived bait in the process, and that she will just as vigorously defend herself with anyone else as she does her mother if she sees the need, including her father and her counselor.  Take any harsh utterances with as much salt as possible, because they are viciously stated for effects, not facts, and often enough can be construed as backhanded compliments (i.e., evidence of trust, actually).  OK to confront rudeness if it comes, or ask if she meant something the way it sounds, but may be more worth it to let it pass with grace and focus on controlling one's own tendencies to catastrophize and overtalk.  Advocate timeout  agreement, prepared when calm, if things turns harsh.  Ongoing advice to firmly refuse her own impulses to irritating behaviors like overexplaining, overworry, over-probing, or trying in any way to lobby Summer's choices without first getting her explicit consent to challenge something. FOO issues -- May consult birth mother and stepmother at will, but also OK not to press it if what she gets is further distressing.  Monitor for inadvertently alarmist advice and be willing to tamp it down for herself.  Explore at discretion only if cogent and careful enough to avoid being triggered to paranoia, or compulsive agreement with wild ideas, or an obligation to swallow underinformed views.  Maintain her own authority to decide how to approach it and when to refuse torturous boundaries.  Stay very realistic about expecting a maturity she can depend on from birth mother and her nominal sisters, all of whom have their own problems and blind spots, and none of whom have a true longterm family relationship with her.  Actively dispute any idea that she is rejected or indicted by people who honestly don't know her that well.  Take all family advice with skepticism, stay ready to recognize when urgency to help will jump to conclusions and make hasty judgments.  Stay ready to write off bad reactions as either too agenda-driven or just too ignorant about her to see the better.   Poverty -- Continue to endorse SSDI as she is unable to hold any gainful employment with a slew of medical and emotional conditions.  Financial counseling as available, otherwise notice and flag any overly expensive items.  Ongoing encouragement to use, and ask about, patient resources, Medicaid and Medicare privileges, and utilize social work rather than make helpless assumptions. Other recommendations/advice -- As may be noted above.  Continue to utilize previously learned skills ad lib. Medication compliance -- Maintain medication as prescribed and  work faithfully with relevant prescriber(s) if any changes are desired or seem indicated. Crisis service -- Aware of call list and work-in appts.  Call the clinic on-call service, 988/hotline, 911, or present to [facility] or ER if any life-threatening psychiatric crisis. Followup -- Return for time as already scheduled, avail earlier @ PT's need.  Next scheduled visit with me 06/05/2024.  Next scheduled in this office 06/05/2024.  Lamar Kendall, PhD [name], PhD LP Clinical Psychologist, [facility] [address], [address] [place], [place] [address] [phone]

## 2024-05-28 ENCOUNTER — Other Ambulatory Visit: Payer: Self-pay | Admitting: Internal Medicine

## 2024-05-28 DIAGNOSIS — Z1231 Encounter for screening mammogram for malignant neoplasm of breast: Secondary | ICD-10-CM

## 2024-06-03 ENCOUNTER — Telehealth: Payer: Self-pay | Admitting: Psychiatry

## 2024-06-03 NOTE — Telephone Encounter (Signed)
 error

## 2024-06-04 ENCOUNTER — Ambulatory Visit (INDEPENDENT_AMBULATORY_CARE_PROVIDER_SITE_OTHER): Payer: Self-pay

## 2024-06-04 DIAGNOSIS — K317 Polyp of stomach and duodenum: Secondary | ICD-10-CM | POA: Diagnosis not present

## 2024-06-04 DIAGNOSIS — K449 Diaphragmatic hernia without obstruction or gangrene: Secondary | ICD-10-CM | POA: Diagnosis not present

## 2024-06-04 DIAGNOSIS — K5730 Diverticulosis of large intestine without perforation or abscess without bleeding: Secondary | ICD-10-CM | POA: Diagnosis not present

## 2024-06-05 ENCOUNTER — Ambulatory Visit: Admitting: Psychiatry

## 2024-06-05 DIAGNOSIS — F4001 Agoraphobia with panic disorder: Secondary | ICD-10-CM | POA: Diagnosis not present

## 2024-06-05 DIAGNOSIS — F17211 Nicotine dependence, cigarettes, in remission: Secondary | ICD-10-CM

## 2024-06-05 DIAGNOSIS — F4312 Post-traumatic stress disorder, chronic: Secondary | ICD-10-CM

## 2024-06-05 DIAGNOSIS — F331 Major depressive disorder, recurrent, moderate: Secondary | ICD-10-CM

## 2024-06-05 DIAGNOSIS — Z599 Problem related to housing and economic circumstances, unspecified: Secondary | ICD-10-CM

## 2024-06-05 DIAGNOSIS — F5104 Psychophysiologic insomnia: Secondary | ICD-10-CM

## 2024-06-05 DIAGNOSIS — F447 Conversion disorder with mixed symptom presentation: Secondary | ICD-10-CM

## 2024-06-05 DIAGNOSIS — G8929 Other chronic pain: Secondary | ICD-10-CM
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
## 2024-06-10 ENCOUNTER — Ambulatory Visit (INDEPENDENT_AMBULATORY_CARE_PROVIDER_SITE_OTHER): Admitting: Psychiatry

## 2024-06-10 ENCOUNTER — Encounter: Payer: Self-pay | Admitting: Adult Health

## 2024-06-10 ENCOUNTER — Telehealth: Admitting: Adult Health

## 2024-06-10 DIAGNOSIS — G8929 Other chronic pain: Secondary | ICD-10-CM

## 2024-06-10 DIAGNOSIS — F4312 Post-traumatic stress disorder, chronic: Secondary | ICD-10-CM

## 2024-06-10 DIAGNOSIS — F411 Generalized anxiety disorder: Secondary | ICD-10-CM

## 2024-06-10 DIAGNOSIS — F410 Panic disorder [episodic paroxysmal anxiety] without agoraphobia: Secondary | ICD-10-CM

## 2024-06-10 DIAGNOSIS — G4700 Insomnia, unspecified: Secondary | ICD-10-CM

## 2024-06-10 DIAGNOSIS — F5104 Psychophysiologic insomnia: Secondary | ICD-10-CM

## 2024-06-10 DIAGNOSIS — F4310 Post-traumatic stress disorder, unspecified: Secondary | ICD-10-CM | POA: Diagnosis not present

## 2024-06-10 DIAGNOSIS — F447 Conversion disorder with mixed symptom presentation: Secondary | ICD-10-CM

## 2024-06-10 DIAGNOSIS — F4001 Agoraphobia with panic disorder: Secondary | ICD-10-CM | POA: Diagnosis not present

## 2024-06-10 DIAGNOSIS — Z599 Problem related to housing and economic circumstances, unspecified: Secondary | ICD-10-CM

## 2024-06-10 DIAGNOSIS — F331 Major depressive disorder, recurrent, moderate: Secondary | ICD-10-CM

## 2024-06-10 MED ORDER — DULOXETINE HCL 60 MG PO CPEP: ORAL_CAPSULE | ORAL | 1 refills | Status: DC

## 2024-06-10 MED ORDER — DULOXETINE HCL 30 MG PO CPEP: ORAL_CAPSULE | ORAL | 1 refills | Status: DC

## 2024-06-10 MED ORDER — OLANZAPINE 7.5 MG PO TABS: ORAL_TABLET | ORAL | 1 refills | Status: AC

## 2024-06-10 NOTE — Progress Notes (Unsigned)
 Psychotherapy Progress Note [facility] [name], PhD LP  Patient ID: Deanna George)    MRN: [id number] Therapy format: Individual psychotherapy Date: 06/10/2024      Start: 3:15p     Stop: 4:05p     Time Spent: 50 min Location: Telehealth visit -- I connected with this patient by an approved telecommunication method (video), with her informed consent, and verifying identity and patient privacy.  I was located at my office and patient at her home.  As needed, we discussed the limitations, risks, and security and privacy concerns associated with telehealth service, including the availability and conditions which currently govern in-person appointments and the possibility that 3rd-party payment may not be fully guaranteed and she may be responsible for charges.  After she indicated understanding, we proceeded with the session.  Also discussed treatment planning, as needed, including ongoing verbal agreement with the plan, the opportunity to ask and answer all questions, her demonstrated understanding of instructions, and her readiness to call the office should symptoms worsen or she feels she is in a crisis state and needs more immediate and tangible assistance.   Session narrative (presenting needs, interim history, self-report of stressors and symptoms, applications of prior therapy, status changes, and interventions made in session) Had ECT on Friday (as advertised Thursday) to help cope with one of her birds dying (Toby).  Says son Deanna George has been golden about it, unfailingly sweet.  Mentions declining an invitation to a movie -- can't afford the ticket, but didn't spell that out; led to consider making sure she does say why, in case it doesn't have to matter, or might otherwise become misleading about her attitude.  Some proof in stories like how he has made sure she will let him treat her to lunch, and how he unnecessarily stayed at the hospital  Has not contacted  [facility] or [facility], but she did research transportation options for ECT.  Checked with [employer], who referred her to [employer], with specific instructions to request hand to hand accompaniment, and know that she might have to ride share, and be in c/o different driver coming and going.  Aware she can use the same service for medical needs, e.g., broken leg.  The other one, [facility] [employer], actually has a $225 fee, so no go.  Got up with her [employer] case worker, today, conferred about transportation.  Some fuzziness about whether social worker is Medicaid or [facility] affiliated (Medicaid).    Led to imagine situations with rides and prepare herself for things she would find awkward -- female driver, having goop in her hair from ECT, e.g., -- and resolved she can trust the service.  Encouraged to practice imaginally PRN visualizing the best over what she might fear.  Side discussion of worries she can have about her kids, notes she is practicing Not my circus, not my monkeys to help herself stay grounded and refuse obsessing.    Psychiatry this afternoon 5:30, plans to ask strategy for a fire extinguisher medication -- or strategy, with current meds.  Reminded that half olanzapine  is most likely recommendation but up to psychiatry to validate that.  Will message separately to help ensure it gets addressed.  Therapeutic modalities: Cognitive Behavioral Therapy, Solution-Oriented/Positive Psychology, and Ego-Supportive  Mental Status/Observations:  Appearance:   Casual     Behavior:  Appropriate  Motor:  Normal  Speech/Language:   Clear and Coherent  Affect:  Appropriate  Mood:  anxious and less  Thought process:  normal  Thought content:    Less obsessing  Sensory/Perceptual disturbances:    WNL  Orientation:  Fully oriented  Attention:  Good    Concentration:  Fair  Memory:  grossly intact  Insight:    Good  Judgment:   Variable  Impulse Control:  Good    Risk Assessment: Danger to Self: No Self-injurious Behavior: No Danger to Others: No Physical Aggression / Violence: No Duty to Warn: No Access to Firearms a concern: No  Assessment of progress:  progressing  Diagnosis:   ICD-10-CM   1. Major depressive disorder, recurrent episode, moderate (HCC)  F33.1     2. Chronic post-traumatic stress disorder (PTSD) with panic attacks and generalized anxiety  F43.12     3. Panic disorder with agoraphobia  F40.01     4. 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 Other chronic pain - multiple causes incl fibromyalgia  G89.29     6. Financial difficulties  Z59.9     7. Functional neurological symptom disorder with mixed symptoms  F44.7      Plan:  Continuing safety and recovery plan -- Ongoing pledge of no harm, will use crisis call service or present to hospital if unable to guarantee safety.  Recognize SI as a way of getting her own attention on feeling overwhelmed and prioritize actions to mitigate stress, find support, address material issues in health and life administration.  Ongoing availability on wait list for earlier appts.  As a complex care patient, best that she obtain referral to nurse or social worker in one or the other health system (either [facility] or [facility] based on her medical issues).  Should involve Medicaid case manager and investigate residential and DBT options. Health -- Keep up with recommended referrals and assessments, treatments for pain, kidney care, and GI distress.  Recommend common vitamin supplements if able for neurological health, including B complex and D.  Advice against smoking and sodas for general health, kidney protection, and gastric irritation. Anxiety/panic management and sleep -- Pursue/maintain appropriate sleep medication, refrain from up-dosing and stacking because it just won't work, it will only tease her into being more anxious.  Practice allowing sleep and consciously settling for  doing all she can today but rest for the next.  As needed, self-affirm her disability inquiry is only to direct next actions updating her claim, not a prelude to putting her on the street, and if further info is needed, we will address it in turn.  Where possible, continue to practice meditation and calming skills at interest, to proficiency in reducing experienced distress.   Trauma -- Practice acknowledging when intrusive memories occur, acknowledging horrors that happen, and self-validating how she made it out.  Practice acknowledging to loved ones when a moment is shadowed by bad memory and she may need relief.  Advise Al-Anon or a related fellowship, either online or in person (better) to improve sense of camaraderie and support. Relationship with adult children -- Pay close attention to open, trusting, revealing behavior and take heart.  Actively dispute catastrophic ideas of what will happen to Deanna George, or their relationship, and recognize PTSD imagination in progress, dispute.  Reaffirm it is developmentally Deanna George's business to work out for herself what her boundaries will be with peers and father.  Practice trust that Deanna George will use her independent mind to figure it out without wrecking her life or taking Deanna George's perceived bait in the process, and that she will  just as vigorously defend herself with anyone else as she does her mother if she sees the need, including her father and her counselor.  Take any harsh utterances with as much salt as possible, because they are viciously stated for effects, not facts, and often enough can be construed as backhanded compliments (i.e., evidence of trust, actually).  OK to confront rudeness if it comes, or ask if she meant something the way it sounds, but may be more worth it to let it pass with grace and focus on controlling one's own tendencies to catastrophize and overtalk.  Advocate timeout agreement, prepared when calm, if things turns harsh.  Ongoing advice to  firmly refuse her own impulses to irritating behaviors like overexplaining, overworry, over-probing, or trying in any way to lobby Deanna George's choices without first getting her explicit consent to challenge something. FOO issues -- May consult birth mother and stepmother at will, but also OK not to press it if what she gets is further distressing.  Monitor for inadvertently alarmist advice and be willing to tamp it down for herself.  Explore at discretion only if cogent and careful enough to avoid being triggered to paranoia, or compulsive agreement with wild ideas, or an obligation to swallow underinformed views.  Maintain her own authority to decide how to approach it and when to refuse torturous boundaries.  Stay very realistic about expecting a maturity she can depend on from birth mother and her nominal sisters, all of whom have their own problems and blind spots, and none of whom have a true longterm family relationship with her.  Actively dispute any idea that she is rejected or indicted by people who honestly don't know her that well.  Take all family advice with skepticism, stay ready to recognize when urgency to help will jump to conclusions and make hasty judgments.  Stay ready to write off bad reactions as either too agenda-driven or just too ignorant about her to see the better.   Poverty -- Continue to endorse SSDI as she is unable to hold any gainful employment with a slew of medical and emotional conditions.  Financial counseling as available, otherwise notice and flag any overly expensive items.  Ongoing encouragement to use, and ask about, patient resources, Medicaid and Medicare privileges, and utilize social work rather than make helpless assumptions. Other recommendations/advice -- As may be noted above.  Continue to utilize previously learned skills ad lib. Medication compliance -- Maintain medication as prescribed and work faithfully with relevant prescriber(s) if any changes are desired or  seem indicated. Crisis service -- Aware of call list and work-in appts.  Call the clinic on-call service, 988/hotline, 911, or present to [facility] or ER if any life-threatening psychiatric crisis. Followup -- No follow-ups on file.  Next scheduled visit with me 06/23/2024.  Next scheduled in this office 06/10/2024.  Lamar Kendall, PhD [name], PhD LP Clinical Psychologist, [facility] [address] [place], [place] [address] [phone]

## 2024-06-10 NOTE — Progress Notes (Signed)
 Deanna George [id number] 1966/04/13 58 y.o.  Virtual Visit via Video Note  I connected with pt @ on 06/10/24 at  5:30 PM EDT by a video enabled telemedicine application and verified that I am speaking with the correct person using two identifiers.   I discussed the limitations of evaluation and management by telemedicine and the availability of in person appointments. The patient expressed understanding and agreed to proceed.  I discussed the assessment and treatment plan with the patient. The patient was provided an opportunity to ask questions and all were answered. The patient agreed with the plan and demonstrated an understanding of the instructions.   The patient was advised to call back or seek an in-person evaluation if the symptoms worsen or if the condition fails to improve as anticipated.  I provided 30 minutes of non-face-to-face time during this encounter.  The patient was located at home.  The provider was located at [facility].   Angeline LOISE Sayers, NP   Subjective:   Patient ID:  Deanna George is a 58 y.o. (DOB May 28, 1966) female.  Chief Complaint: No chief complaint on file.   HPI Deanna George presents for follow-up of PTSD, GAD, MDD, insomnia and panic disorder.  Reports recent loss of pet - last week - reports being seen at [facility] for an  ECT treatment to help with mood symptoms related to loss.  Describes mood today as different every day. Pleasant. Reports tearfulness - at times. Mood symptoms - reports depression today - reports recent loss of pet - I'm trying to work through it. Denies anxiety and irritability - not today. Reports low interest and motivation - very low. Reports panic attacks - 6 to 7 a week. Reports worry, rumination and over thinking. Reports ongoing situational stressors - health and financial. Reports difficulties completing daily tasks - this week I have stayed in the bed. Reports chronic pain issues and is managing multiple  health issues. Reports mood is variable - trying to keep stable with my medications. Stating I feel like I'm .   Reports taking medications as prescribed and feels like they are helpful. Continues to work with Dr. Marijean. Taking medications as prescribed.  Energy levels lower. Unable to establish a regular exercise routine with physical disabilities.  Active, does not have a regular exercise routine with physical limitations.   Reports she is unable to enjoy interests and activities. Single. Lives alone with cat and 3 birds. Has 2 children.  Appetite decreased - eating her first meal of the day in the evening. Reports weight stable. Current weight - 170 pounds. Reports sleeping difficulties. Averages 6 to 8 hours since hospitalization. Reports difficulties with focus and concentration. Completing some tasks. Managing minimal aspects of household. Disabled and unable to work. Denies SI or HI.  Denies AH or VH. Denies self harm. Reports smoking OTC THC at bedtime - 4 to 7 nights a week.  Previous medications: Ritalin and Thorazine - others, Trazadone  Review of Systems:  Review of Systems  Musculoskeletal:  Negative for gait problem.  Neurological:  Negative for tremors.  Psychiatric/Behavioral:         Please refer to HPI    Medications: I have reviewed the patient's current medications.  Current Outpatient Medications  Medication Sig Dispense Refill   albuterol  (VENTOLIN  HFA) 108 (90 Base) MCG/ACT inhaler Inhale 2 puffs into the lungs every 6 (six) hours as needed for wheezing or shortness of breath. 8 g 2   CANNABIDIOL PO Take  by mouth at bedtime.     clonazePAM  (KLONOPIN ) 1 MG tablet Take 1 tablet (1 mg total) by mouth 4 (four) times daily as needed for anxiety. 120 tablet 2   cyclobenzaprine  (FLEXERIL ) 10 MG tablet TAKE 1 TABLET BY MOUTH EVERY 8 HOURS AS NEEDED FOR 30 DAYS     dicyclomine  (BENTYL ) 10 MG capsule Take 1 capsule (10 mg total) by mouth 4 (four) times daily -   before meals and at bedtime for 7 days. 28 capsule 0   diphenhydrAMINE  (BENADRYL ) 25 mg capsule Take 25 mg by mouth every 6 (six) hours as needed for allergies.     DULoxetine  (CYMBALTA ) 30 MG capsule Take one capsule daily - tapering up to the 60mg  daily. 30 capsule 5   DULoxetine  (CYMBALTA ) 60 MG capsule Take 2 capsules (120 mg total) by mouth daily. 60 capsule 5   empagliflozin (JARDIANCE) 25 MG TABS tablet Take 25 mg by mouth daily.     FLUoxetine  (PROZAC ) 20 MG capsule Take 1 capsule (20 mg total) by mouth daily. 90 capsule 1   fluticasone  (FLONASE ) 50 MCG/ACT nasal spray Place 2 sprays into both nostrils daily for 7 days. 15.8 mL 0   HYDROcodone -acetaminophen  (NORCO) 5-325 MG tablet Take 1 tablet by mouth every 6 (six) hours as needed for moderate pain. (Patient not taking: Reported on 03/26/2023) 15 tablet 0   hydrOXYzine  (ATARAX ) 25 MG tablet Take one tablet four times daily as needed for anxiety and insomnia. 360 tablet 1   hyoscyamine  (LEVSIN  SL) 0.125 MG SL tablet Place 0.125 mg under the tongue every 6 (six) hours as needed for cramping.     ibuprofen  (ADVIL ) 800 MG tablet 1 tablet with food or milk as needed     loperamide  (IMODIUM ) 2 MG capsule Take 1 capsule (2 mg total) by mouth as needed for diarrhea or loose stools. 30 capsule 0   losartan  (COZAAR ) 50 MG tablet Take 1 tablet (50 mg total) by mouth in the morning and at bedtime. 60 tablet 11   Melatonin 10 MG CAPS Take 10 mg by mouth at bedtime as needed.      metoprolol  tartrate (LOPRESSOR ) 25 MG tablet TAKE 1 TABLET BY MOUTH TWICE DAILY. (Patient not taking: Reported on 03/26/2023) 60 tablet 11   OLANZapine  (ZYPREXA ) 7.5 MG tablet Take 1 tablet (7.5 mg total) by mouth at bedtime. 90 tablet 1   omeprazole  (PRILOSEC) 40 MG capsule Take 1 capsule (40 mg total) by mouth in the morning and at bedtime. 60 capsule 0   ondansetron  (ZOFRAN ) 4 MG tablet Take 1 tablet (4 mg total) by mouth every 6 (six) hours as needed for nausea or vomiting.  30 tablet 11   ondansetron  (ZOFRAN ) 4 MG tablet Take 1 tablet (4 mg total) by mouth every 6 (six) hours as needed for nausea. 20 tablet 0   prazosin  (MINIPRESS ) 5 MG capsule Take 1 capsule (5 mg total) by mouth at bedtime. 30 capsule 5   predniSONE  (STERAPRED UNI-PAK 21 TAB) 10 MG (21) TBPK tablet As directed on packaging 1 each 0   promethazine  (PHENERGAN ) 25 MG tablet Take 1 tablet (25 mg total) by mouth every 6 (six) hours as needed for nausea or vomiting. 30 tablet 11   propranolol (INDERAL) 20 MG tablet Take 1 tablet by mouth 2 (two) times daily.     rizatriptan  (MAXALT -MLT) 10 MG disintegrating tablet Take 1 tablet (10 mg total) by mouth as needed. May repeat in 2 hours if needed 15  tablet 4   SUMAtriptan  (IMITREX ) 50 MG tablet Take 50 mg by mouth every 2 (two) hours as needed for migraine. May repeat in 2 hours if headache persists or recurs.     SUMAtriptan  (IMITREX ) 6 MG/0.5ML SOLN injection Inject 0.5 mLs (6 mg total) into the skin as needed for migraine. May repeat in 2 hours if headache persists or recurs. 10 vial 4   No current facility-administered medications for this visit.    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    Situational stress    Urinary tract infection     Family History  Adopted: Yes  Problem Relation Age of Onset   Colon cancer Neg Hx    Pancreatic cancer Neg Hx    Stomach cancer Neg Hx     Rectal cancer Neg Hx     Social History   Socioeconomic History   Marital status: Divorced    Spouse name: Not on file   Number of children: 2   Years of education: Not on file   Highest education level: Not on file  Occupational History   Occupation: student    Comment: graduated 2018  Tobacco Use   Smoking status: Some Days    Types: Cigarettes   Smokeless tobacco: Never  Vaping Use   Vaping status: Never Used  Substance and Sexual Activity   Alcohol use: Not Currently    Comment: Rare   Drug use: Not Currently   Sexual activity: Not Currently  Other Topics Concern   Not on file  Social History Narrative   Not on file   Social Drivers of Health   Financial Resource Strain: Low Risk  (05/21/2024)   Received from [facility]   Overall Financial Resource Strain (CARDIA)    Difficulty of Paying Living Expenses: Not hard at all  Food Insecurity: No Food Insecurity (05/21/2024)   Received from [facility]   Hunger Vital Sign    Within the past 12 months, you worried that your food would run out before you got the money to buy more.: Never true    Within the past 12 months, the food you bought just didn't last and you didn't have money to get more.: Never true  Transportation Needs: No Transportation Needs (05/21/2024)   Received from [facility] - Transportation    In the past 12 months, has lack of transportation kept you from medical appointments or from getting medications?: No    Lack of Transportation (Non-Medical): No  Physical Activity: Inactive (08/01/2023)   Received from [facility]   Exercise Vital Sign    On average, how many days per week do you engage in moderate to strenuous exercise (like a brisk walk)?: 0 days    On  average, how many minutes do you engage in exercise at this level?: 0 min  Stress: Stress Concern Present (08/07/2023)   Received from [facility] - Occupational Stress Questionnaire    Feeling of Stress : Very much  Social Connections: Socially Isolated (08/01/2023)   Received from [facility]   Social Connection and Isolation Panel    In a typical week, how many times do you talk on the phone with family, friends, or neighbors?: Once a week    How often do you get together with friends or relatives?: Never    How often do you attend church or religious services?: Never    Do you belong to any clubs or organizations such as church groups, unions, fraternal or athletic groups, or school groups?: No    How often do you attend meetings of the clubs or organizations you belong to?: Never    Are you married, widowed, divorced, separated, never married, or living with a partner?: Divorced  Intimate Partner Violence: Not At Risk (11/17/2021)   Received from [facility]   Humiliation, Afraid, Rape, and Kick questionnaire    Within the last year, have you been afraid of your partner or ex-partner?: No    Within the last year, have you been humiliated or emotionally abused in other ways by your partner or ex-partner?: No    Within the last year, have you been kicked, hit, slapped, or otherwise physically hurt by your partner or ex-partner?: No    Within the last year, have you been raped or forced to have any kind of sexual activity by your partner or ex-partner?: No    Past Medical History, Surgical history, Social history, and Family history were reviewed and updated as appropriate.   Please see review of systems for further details on the patient's review from today.   Objective:   Physical Exam:  There were no vitals taken for this visit.  Physical Exam Constitutional:      General: She is not in acute distress. Musculoskeletal:        General: No deformity.  Neurological:     Mental Status: She is alert and oriented to person, place, and time.     Coordination:  Coordination normal.  Psychiatric:        Attention and Perception: Attention and perception normal. She does not perceive auditory or visual hallucinations.        Mood and Affect: Mood normal. Mood is not anxious or depressed. Affect is not labile, blunt, angry or inappropriate.        Speech: Speech normal.        Behavior: Behavior normal.        Thought Content: Thought content normal. Thought content is not paranoid or delusional. Thought content does not include homicidal or suicidal ideation. Thought content does not include homicidal or suicidal plan.        Cognition and Memory: Cognition and memory normal.        Judgment: Judgment normal.     Comments: Insight intact     Lab Review:     Component Value Date/Time   NA 135 05/16/2021 2351   NA 140 03/27/2017 1543   K 4.2 05/16/2021 2351   CL 105 05/16/2021 2351   CO2 20 (L) 05/16/2021 2351   GLUCOSE 134 (H) 05/16/2021 2351   BUN 18 05/16/2021 2351   BUN 6 03/27/2017 1543   CREATININE  1.20 (H) 08/20/2023 1639   CREATININE 0.95 09/28/2017 1847   CALCIUM  9.4 05/16/2021 2351   PROT 7.9 05/17/2021 0314   PROT 7.2 03/27/2017 1543   ALBUMIN 4.3 05/17/2021 0314   ALBUMIN 4.5 03/27/2017 1543   AST 16 05/17/2021 0314   ALT 12 05/17/2021 0314   ALKPHOS 83 05/17/2021 0314   BILITOT 0.6 05/17/2021 0314   BILITOT 0.2 03/27/2017 1543   GFRNONAA >60 05/16/2021 2351   GFRNONAA 70 09/28/2017 1847   GFRAA 82 09/28/2017 1847       Component Value Date/Time   WBC 11.3 (H) 05/16/2021 2351   RBC 4.30 05/16/2021 2351   HGB 11.8 (L) 05/16/2021 2351   HGB 12.5 03/27/2017 1543   HCT 35.1 (L) 05/16/2021 2351   HCT 39.0 03/27/2017 1543   PLT 209 05/16/2021 2351   PLT 248 03/27/2017 1543   MCV 81.6 05/16/2021 2351   MCV 87 03/27/2017 1543   MCH 27.4 05/16/2021 2351   MCHC 33.6 05/16/2021 2351   RDW 14.8 05/16/2021 2351   RDW 13.6 03/27/2017 1543   LYMPHSABS 1.7 10/03/2020 0511   LYMPHSABS 1.7 03/27/2017 1543   MONOABS 0.3  10/03/2020 0511   EOSABS 0.2 10/03/2020 0511   EOSABS 0.1 03/27/2017 1543   BASOSABS 0.0 10/03/2020 0511   BASOSABS 0.0 03/27/2017 1543    No results found for: POCLITH, LITHIUM   No results found for: PHENYTOIN, PHENOBARB, VALPROATE, CBMZ   .res Assessment: Plan:    Plan:  Propranolol through PCP  Added at [facility]: Prozac  20mg  daily Hydroxyzine  25mg  - 1 to 2 capsules daily  Zyprexa  7.5mg  at hs - may take an extra 1/2 tablet as needed for panic attacks. Cymbalta  - 90mg  - reports GI issues. Prazosin  5mg  at hs - nightmares  Clonazepam  1mg  4 times daily - ODT from tablet   Continue therapy with Andy Mitchum - meeting every 2 weeks.  RTC 4 weeks  30 minutes spent dedicated to the care of this patient on the date of this encounter to include pre-visit review of records, ordering of medication, post visit documentation, and face-to-face time with the patient discussing PTSD, GAD, MDD, insomnia and panic disorder. Discussed continuing current medication regimen.  Patient advised to contact office with any questions, adverse effects, or acute worsening in signs and symptoms.  Discussed potential benefits, risk, and side effects of benzodiazepines to include potential risk of tolerance and dependence, as well as possible drowsiness.  Advised patient not to drive if experiencing drowsiness and to take lowest possible effective dose to minimize risk of dependence and tolerance.  Discussed potential benefits, risks, and side effects of stimulants with patient to include increased heart rate, palpitations, insomnia, increased anxiety, increased irritability, or decreased appetite.  Instructed patient to contact office if experiencing any significant tolerability issues.  There are no diagnoses linked to this encounter.   Please see After Visit Summary for patient specific instructions.  Future Appointments  Date Time Provider Department Center  06/10/2024  5:30 PM Forrest Demuro,  Angeline Mattocks, NP [facility] None  06/11/2024  2:30 PM Gail Sharlet KIDD, RD [facility] None  06/23/2024  2:00 PM Marijean Charleston, PhD [facility] None  07/08/2024  3:00 PM Marijean Charleston, PhD [facility] None  07/25/2024  2:00 PM Marijean Charleston, PhD [facility] None  08/07/2024  2:00 PM Marijean Charleston, PhD [facility] None  08/21/2024  2:00 PM Marijean Charleston, PhD [facility] None    No orders of the defined types were placed in this encounter.     -------------------------------

## 2024-06-11 ENCOUNTER — Encounter: Attending: Internal Medicine | Admitting: Dietician

## 2024-06-11 ENCOUNTER — Encounter: Payer: Self-pay | Admitting: Dietician

## 2024-06-11 VITALS — Wt 186.7 lb

## 2024-06-11 DIAGNOSIS — K582 Mixed irritable bowel syndrome: Secondary | ICD-10-CM | POA: Insufficient documentation

## 2024-06-11 DIAGNOSIS — Z713 Dietary counseling and surveillance: Secondary | ICD-10-CM | POA: Diagnosis not present

## 2024-06-11 DIAGNOSIS — G8929 Other chronic pain: Secondary | ICD-10-CM | POA: Diagnosis not present

## 2024-06-11 DIAGNOSIS — R1031 Right lower quadrant pain: Secondary | ICD-10-CM | POA: Diagnosis not present

## 2024-06-11 DIAGNOSIS — E119 Type 2 diabetes mellitus without complications: Secondary | ICD-10-CM | POA: Insufficient documentation

## 2024-06-11 NOTE — Patient Instructions (Signed)
 Work to eat a meal or snack every 2-4 hours during the day. Allow up to 30 minutes to eat what is manageable, then put the rest away, and wait 2-3 hours before eating more.  Eat low fiber, low fat foods.  Continue with breakfast drinks one or more times a day.  Focus on cool temp foods which can be easier to eat/ no or less odor. Work on pushing 1 more bite when feeling full, to gradually tolerance.  Try blending 1c vanilla greek yogurt with 1/3c peanut butter and 2 teaspoons of honey, and use as a dip with graham crackers or low fiber fruit

## 2024-06-11 NOTE — Progress Notes (Unsigned)
 Diabetes Self-Management Education  Visit Type: First/Initial  Appt. Start Time: 1435 Appt. End Time: 1535  06/11/2024  Ms. Deanna George, identified by name and date of birth, is a 58 y.o. female with a diagnosis of Diabetes: Type 2.   ASSESSMENT  Weight 186 lb 11.2 oz (84.7 kg). Body mass index is 32.05 kg/m.   Diabetes Self-Management Education - 06/11/24 1505       Visit Information   Visit Type First/Initial      Initial Visit   Diabetes Type Type 2    Are you currently following a meal plan? No    Are you taking your medications as prescribed? Not on Medications      Psychosocial Assessment   Patient Belief/Attitude about Diabetes Defeat/Burnout   due to multiple dietary restrictions   What is the hardest part about your diabetes right now, causing you the most concern, or is the most worrisome to you about your diabetes?   Being active    Preferred Learning Style Hands on    How often do you need to have someone help you when you read instructions, pamphlets, or other written materials from your doctor or pharmacy? 2 - Rarely    What is the last grade level you completed in school? bachelors degree      Pre-Education Assessment   Patient understands the diabetes disease and treatment process. Needs Instruction    Patient understands incorporating nutritional management into lifestyle. Comprehends key points    Patient undertands incorporating physical activity into lifestyle. Needs Review    Patient understands using medications safely. N/A (comment)    Patient understands monitoring blood glucose, interpreting and using results Needs Review    Patient understands prevention, detection, and treatment of acute complications. Needs Review    Patient understands prevention, detection, and treatment of chronic complications. Needs Review    Patient understands how to develop strategies to address psychosocial issues. Needs Review    Patient understands how to develop  strategies to promote health/change behavior. Comprehends key points      Complications   Last HgB A1C per patient/outside source 6.2 %   05/21/24   How often do you check your blood sugar? 0 times/day (not testing)   maybe 1-2 times a month, or less, per patient   Have you had a dilated eye exam in the past 12 months? Yes    Have you had a dental exam in the past 12 months? No    Are you checking your feet? No      Dietary Intake   Breakfast drinks sweet tea (does not like tap water)    Lunch Carnation breakfast drink; 7/30 french fries kids' size    Snack (afternoon) none or breakfast drink    Dinner depends on GI symptoms; sometimes small portion meat +/or starch; usually one food item at a time; sometimes breakfast drink, sometimes goes several days without solid food    Snack (evening) none    Beverage(s) sweet tea, carnation breakfast drinks      Activity / Exercise   Activity / Exercise Type ADL's      Patient Education   Previous Diabetes Education No    Healthy Eating Meal timing in regards to the patients' current diabetes medication.;Meal options for control of blood glucose level and chronic complications.;Other (comment)   low fiber, low fat diet guidelines to manage GI symptoms and meet basic nutritional needs     Individualized Goals (developed by patient)   Nutrition  Follow meal plan discussed;Other (comment)   gradually work towards increasing solid food intake and meeting basic nutritional needs.     Outcomes   Expected Outcomes Other (comment)   physical symptoms, and some anxiety towards eating, make basic nutrition a priority, but will be difficult until symptoms improve.   Future DMSE 2 months          Individualized Plan for Diabetes Self-Management Training:   Learning Objective:  Patient will have a greater understanding of diabetes self-management. Patient education plan is to attend individual and/or group sessions per assessed needs and concerns.    Plan:   Patient Instructions  Work to eat a meal or snack every 2-4 hours during the day. Allow up to 30 minutes to eat what is manageable, then put the rest away, and wait 2-3 hours before eating more.  Eat low fiber, low fat foods.  Continue with breakfast drinks one or more times a day.  Focus on cool temp foods which can be easier to eat/ no or less odor. Work on pushing 1 more bite when feeling full, to gradually tolerance.  Try blending 1c vanilla greek yogurt with 1/3c peanut butter and 2 teaspoons of honey, and use as a dip with graham crackers or low fiber fruit  Expected Outcomes:  Other (comment) (physical symptoms, and some anxiety towards eating, make basic nutrition a priority, but will be difficult until symptoms improve.)  Education material provided: Low Fiber Diet; Nutrition Therapy for IBS  If problems or questions, patient to contact team via:  Phone and patient portal  Future DSME appointment: 2 months

## 2024-06-17 ENCOUNTER — Other Ambulatory Visit: Payer: Self-pay | Admitting: Gastroenterology

## 2024-06-17 DIAGNOSIS — K5710 Diverticulosis of small intestine without perforation or abscess without bleeding: Secondary | ICD-10-CM

## 2024-06-17 DIAGNOSIS — Q453 Other congenital malformations of pancreas and pancreatic duct: Secondary | ICD-10-CM

## 2024-06-23 ENCOUNTER — Ambulatory Visit (INDEPENDENT_AMBULATORY_CARE_PROVIDER_SITE_OTHER): Admitting: Psychiatry

## 2024-06-23 DIAGNOSIS — F331 Major depressive disorder, recurrent, moderate: Secondary | ICD-10-CM | POA: Diagnosis not present

## 2024-06-23 DIAGNOSIS — F4312 Post-traumatic stress disorder, chronic: Secondary | ICD-10-CM

## 2024-06-23 DIAGNOSIS — F4001 Agoraphobia with panic disorder: Secondary | ICD-10-CM | POA: Diagnosis not present

## 2024-06-23 DIAGNOSIS — G8929 Other chronic pain: Secondary | ICD-10-CM | POA: Diagnosis not present

## 2024-06-23 DIAGNOSIS — Z599 Problem related to housing and economic circumstances, unspecified: Secondary | ICD-10-CM

## 2024-06-23 NOTE — Progress Notes (Signed)
 Psychotherapy Progress Note [facility] [name], PhD LP  Patient ID: Deanna George)    MRN: [id number] Therapy format: Individual psychotherapy Date: 06/23/2024      Start: 2:20p     Stop: 3:10p     Time Spent: 50 min Location: Telehealth visit -- I connected with this patient by an approved telecommunication method (video), with her informed consent, and verifying identity and patient privacy.  I was located at my office and patient at her home.  As needed, we discussed the limitations, risks, and security and privacy concerns associated with telehealth service, including the availability and conditions which currently govern in-person appointments and the possibility that 3rd-party payment may not be fully guaranteed and she may be responsible for charges.  After she indicated understanding, we proceeded with the session.  Also discussed treatment planning, as needed, including ongoing verbal agreement with the plan, the opportunity to ask and answer all questions, her demonstrated understanding of instructions, and her readiness to call the office should symptoms worsen or she feels she is in a crisis state and needs more immediate and tangible assistance.   Session narrative (presenting needs, interim history, self-report of stressors and symptoms, applications of prior therapy, status changes, and interventions made in session) Got her Social Security notification, which thankfully states they don't need to contact doctors or solicit records at this time.  Relieved.  Went through terrific anxiety just fetching and opening her mail, and knows she would have gone through a destructively obsessive state, likely deteriorated and gone back to the hospital had it merely said she was under review.  Assured it most likely means 3 years till next review has already been decided, which comes as relief.  Affirmed that her own answers, along with doctor's note supplied, constitute  an appropriate, responsible reply, and she should take credit for braving her fears enough to do the work on that.  It is the particular responsibility of an aid recipient to do that much, and if she is at all worried about being profiled, targeted, or judged, showing up is the antidote.  Admits she's been staying in bed a lot at her mobile home over the last 6 wks, still struggling mightily with anxiety despite professed benefit of maintenance ECT.  Was even debating whether to cancel today due to anxiety.  Affirmed keeping the appointment anyway, since anxiety and addressing it is the point of meeting, and courage to do so a centerpiece of her intended recovery.  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  Worries about results leading to something that will be bankrupting, which is understandable, but assured that Medicaid coverage means she is already covered for whatever gets medically prescribed.  Checked into possible gut irritants she might control for her own good -- says she is still quit from cigarettes, sparing coffee, some tea.  Has met with a dietician, who is concerned with her lack of vitamin intake.  MVI gummies are more palatable than pill form.  Encouraged in continuing to manage identifiable factors.  Socially, it's still going well with kids, who are still providing rides to ECT and other treatment.  Reviewed prior conversation about ride service, which still sounds like it would be effectively a ride service to and from [facility] E[facility].  Encouraged to still consider, and also check with any church-based charitable possibilities.  Addressed apprehension about the day.  Acknowledged that she may be afraid to be  in touch with issues today, and she is in the habit of trying not to think about things that stress her.  Encouraged in allowing it tot he point of problem-solving and information-gathering that she needs, lest she compound her own problems again.   Framed an attitude statement for the morning -- I don't have to hold up the world today.  I want to figure out a few things about how to get around in my world today.  Closes with affirmation that she is giving herself literal pats on the back for hauling up and doing some things at home despite anxiety.  Therapeutic modalities: Cognitive Behavioral Therapy, Solution-Oriented/Positive Psychology, and Ego-Supportive  Mental Status/Observations:  Appearance:   Casual     Behavior:  Appropriate  Motor:  Normal  Speech/Language:   Clear and Coherent  Affect:  Appropriate  Mood:  anxious  Thought process:  normal  Thought content:    WNL and worry  Sensory/Perceptual disturbances:    WNL  Orientation:  Fully oriented  Attention:  Good    Concentration:  Fair  Memory:  WNL  Insight:    Variable  Judgment:   Good  Impulse Control:  Variable   Risk Assessment: Danger to Self: No Self-injurious Behavior: No Danger to Others: No Physical Aggression / Violence: No Duty to Warn: No Access to Firearms a concern: No  Assessment of progress:  progressing  Diagnosis:   ICD-10-CM   1. Major depressive disorder, recurrent episode, moderate (HCC)  F33.1     2. Panic disorder with agoraphobia  F40.01     3. Chronic post-traumatic stress disorder (PTSD) with panic attacks and generalized anxiety  F43.12     4. Other chronic pain - multiple causes incl fibromyalgia and internal organ issues  G89.29     5. Financial difficulties  Z59.9      Plan:  Continuing safety and recovery plan -- Ongoing pledge of no harm, will use crisis call service or present to hospital if unable to guarantee safety.  Recognize SI as a way of getting her own attention on feeling overwhelmed and prioritize actions to mitigate stress, find support, address material issues in health and life administration.  Ongoing availability on wait list for earlier appts.  As a complex care patient, best that she obtain referral  to nurse or social worker in one or the other health system (either [facility] or [facility] based on her medical issues).  Should involve Medicaid case manager and investigate residential and DBT options. Health -- Keep up with recommended referrals and assessments, treatments for pain, kidney care, and GI distress.  Recommend common vitamin supplements if able for neurological health, including B complex and D.  Advice against smoking and sodas for general health, kidney protection, and gastric irritation. Anxiety/panic management and sleep -- Pursue/maintain appropriate sleep medication, refrain from up-dosing and stacking because it just won't work, it will only tease her into being more anxious.  Practice allowing sleep and consciously settling for doing all she can today but rest for the next.  As needed, self-affirm her disability inquiry is only to direct next actions updating her claim, not a prelude to putting her on the street, and if further info is needed, we will address it in turn.  Where possible, continue to practice meditation and calming skills at interest, to proficiency in reducing experienced distress.   Trauma -- Practice acknowledging when intrusive memories occur, acknowledging horrors that happen, and self-validating how she made it out.  Practice acknowledging to loved ones when a moment is shadowed by bad memory and she may need relief.  Advise Al-Anon or a related fellowship, either online or in person (better) to improve sense of camaraderie and support. Relationship with adult children -- Pay close attention to open, trusting, revealing behavior and take heart.  Actively dispute catastrophic ideas of what will happen to Summer, or their relationship, and recognize PTSD imagination in progress, dispute.  Reaffirm it is developmentally Summer's business to work out for herself what her boundaries will be with peers and father.  Practice trust that Summer will use her independent mind to figure it  out without wrecking her life or taking [name]'s perceived bait in the process, and that she will just as vigorously defend herself with anyone else as she does her mother if she sees the need, including her father and her counselor.  Take any harsh utterances with as much salt as possible, because they are viciously stated for effects, not facts, and often enough can be construed as backhanded compliments (i.e., evidence of trust, actually).  OK to confront rudeness if it comes, or ask if she meant something the way it sounds, but may be more worth it to let it pass with grace and focus on controlling one's own tendencies to catastrophize and overtalk.  Advocate timeout agreement, prepared when calm, if things turns harsh.  Ongoing advice to firmly refuse her own impulses to irritating behaviors like overexplaining, overworry, over-probing, or trying in any way to lobby Summer's choices without first getting her explicit consent to challenge something. FOO issues -- May consult birth mother and stepmother at will, but also OK not to press it if what she gets is further distressing.  Monitor for inadvertently alarmist advice and be willing to tamp it down for herself.  Explore at discretion only if cogent and careful enough to avoid being triggered to paranoia, or compulsive agreement with wild ideas, or an obligation to swallow underinformed views.  Maintain her own authority to decide how to approach it and when to refuse torturous boundaries.  Stay very realistic about expecting a maturity she can depend on from birth mother and her nominal sisters, all of whom have their own problems and blind spots, and none of whom have a true longterm family relationship with her.  Actively dispute any idea that she is rejected or indicted by people who honestly don't know her that well.  Take all family advice with skepticism, stay ready to recognize when urgency to help will jump to conclusions and make hasty judgments.   Stay ready to write off bad reactions as either too agenda-driven or just too ignorant about her to see the better.   Poverty -- Continue to endorse SSDI as she is unable to hold any gainful employment with a slew of medical and emotional conditions.  Financial counseling as available, otherwise notice and flag any overly expensive items.  Ongoing encouragement to use, and ask about, patient resources, Medicaid and Medicare rights and privileges, and utilize social work (e.g., Medicaid/[employer] coach) rather than make helpless assumptions. Other recommendations/advice -- As may be noted above.  Continue to utilize previously learned skills ad lib. Medication compliance -- Maintain medication as prescribed and work faithfully with relevant prescriber(s) if any changes are desired or seem indicated. Crisis service -- Aware of call list and work-in appts.  Call the clinic on-call service, 988/hotline, 911, or present to [facility] or ER if any life-threatening psychiatric crisis. Followup --  Return for time as already scheduled.  Next scheduled visit with me 07/08/2024.  Next scheduled in this office 07/08/2024.  Lamar Kendall, PhD [name], PhD LP Clinical Psychologist, [facility] [address], [address] [place], [place] [address] [phone]

## 2024-06-25 ENCOUNTER — Other Ambulatory Visit: Payer: Self-pay | Admitting: Gastroenterology

## 2024-06-25 ENCOUNTER — Ambulatory Visit
Admission: RE | Admit: 2024-06-25 | Discharge: 2024-06-25 | Disposition: A | Discharge status: Home / Self Care | Source: Ambulatory Visit | Attending: Gastroenterology | Admitting: Gastroenterology

## 2024-06-25 DIAGNOSIS — K5710 Diverticulosis of small intestine without perforation or abscess without bleeding: Secondary | ICD-10-CM | POA: Insufficient documentation

## 2024-06-25 DIAGNOSIS — Q453 Other congenital malformations of pancreas and pancreatic duct: Secondary | ICD-10-CM | POA: Insufficient documentation

## 2024-06-25 MED ORDER — GADOBUTROL 1 MMOL/ML IV SOLN
8.0000 mL | Freq: Once | INTRAVENOUS | Status: AC | PRN
  Administered 2024-06-25 (×2): 8 mL via INTRAVENOUS

## 2024-07-08 ENCOUNTER — Ambulatory Visit (INDEPENDENT_AMBULATORY_CARE_PROVIDER_SITE_OTHER): Admitting: Psychiatry

## 2024-07-08 DIAGNOSIS — F331 Major depressive disorder, recurrent, moderate: Secondary | ICD-10-CM

## 2024-07-08 DIAGNOSIS — F4001 Agoraphobia with panic disorder: Secondary | ICD-10-CM | POA: Diagnosis not present

## 2024-07-08 DIAGNOSIS — F4312 Post-traumatic stress disorder, chronic: Secondary | ICD-10-CM | POA: Diagnosis not present

## 2024-07-08 DIAGNOSIS — Z599 Problem related to housing and economic circumstances, unspecified: Secondary | ICD-10-CM

## 2024-07-08 DIAGNOSIS — G8929 Other chronic pain: Secondary | ICD-10-CM

## 2024-07-08 NOTE — Progress Notes (Signed)
 Psychotherapy Progress Note [facility] [name], PhD LP  Patient ID: Deanna George)    MRN: [id number] Therapy format: Individual psychotherapy Date: 07/08/2024      Start: 3:15p     Stop: 4:04p     Time Spent: 49 min Location: Telehealth visit -- I connected with this patient by an approved telecommunication method (video), with her informed consent, and verifying identity and patient privacy.  I was located at my office and patient at her home.  As needed, we discussed the limitations, risks, and security and privacy concerns associated with telehealth service, including the availability and conditions which currently govern in-person appointments and the possibility that 3rd-party payment may not be fully guaranteed and she may be responsible for charges.  After she indicated understanding, we proceeded with the session.  Also discussed treatment planning, as needed, including ongoing verbal agreement with the plan, the opportunity to ask and answer all questions, her demonstrated understanding of instructions, and her readiness to call the office should symptoms worsen or she feels she is in a crisis state and needs more immediate and tangible assistance.   Session narrative (presenting needs, interim history, self-report of stressors and symptoms, applications of prior therapy, status changes, and interventions made in session) Medical developments since last met -- had MRI and US  guided endoscopy, discovered a rare issue, pancreatic divisum, for which Dr. Onita (GI) is now seeking a specialist at [facility] or in [place].  Helps to know more, and it probably explains 4 years now of intermittent nausea and pain.  Does mean some challenging decisions, like surgery (possible pancreatic stent) and pain treatment (can seek tramadol , maybe, from PCP, but not GI).  Re meds, has not been able to take her duloxetine  due to reactive digestive system.  Been able to get down and hold  down fluoxetine , hydroxyzine , gabapentin, losartan , and pregabalin.  Validated finding a trustworthy physical issue rather than having to wonder whether everything that comes up is psychosomatic (or FNSD).  Another bird died last week, unfortunately.  Emotional turmoil threw off her plan to call the pain clinic to find out about her options and clearances, but hopes to do so.  Encouraged to follow through.  Son [name] is still treating her very well, a continued relief after years complaining if his distance, or his perceived preference for his father.  He's been very affirming, and had time recently for an unexpected 35 min on the phone.  Grateful for Universe/God to pull it off.  He recently turned 27, supporting the idea long advised to her by former counselor that he would find his way to maturity in his 48s, and she could stop worrying about how he'll turn out or who he'll be loyal to.  Alludes to a difficult conversation with birth mother recently.  Follows being sent news of birth father's obit, and taking some surprise, invalidating criticism that she wasn't more interested.  Despite trying to suppress herself, she managed to divulge how she's still hurt herself, actually, by the rejection she's experienced by the [place]  family she rediscovered, and how a conflict with birth M got played out, effectively in being trashed and cancelled to others by birth M.  Proud, actually, of how even tempered she was in telling her about this, and how she risked her equilibrium to do so.  Affirmed breaking through to honesty, despite well-worn self-suppression, and apparently dropping a great weight when she did.  Not received as hoped, but can  fairly say it's irrelevant.  Agreed it would be fair to tell bM also that she trusts it was not intentional on her part to cancel her, just that consequential of her to gossip like that, preventing the hope of a reattachment to family that she had cherished deeply.   Agrees with the framework of accidentally cancelling, may try to clarify that.  Therapeutic modalities: Cognitive Behavioral Therapy, Solution-Oriented/Positive Psychology, Ego-Supportive, and Assertiveness/Communication  Mental Status/Observations:  Appearance:   Casual     Behavior:  Appropriate  Motor:  Normal  Speech/Language:   Clear and Coherent  Affect:  Appropriate  Mood:  anxious and sad, responsive  Thought process:  normal  Thought content:    WNL  Sensory/Perceptual disturbances:    WNL  Orientation:  Fully oriented  Attention:  Good    Concentration:  Good  Memory:  WNL  Insight:    Good  Judgment:   Good  Impulse Control:  Variable   Risk Assessment: Danger to Self: No Self-injurious Behavior: No Danger to Others: No Physical Aggression / Violence: No Duty to Warn: No Access to Firearms a concern: No  Assessment of progress:  progressing  Diagnosis:   ICD-10-CM   1. Major depressive disorder, recurrent episode, moderate (HCC)  F33.1     2. Panic disorder with agoraphobia  F40.01     3. Chronic post-traumatic stress disorder (PTSD) with panic attacks and generalized anxiety  F43.12     4. Other chronic pain - multiple causes incl fibromyalgia and internal organ issues  G89.29     5. Financial difficulties  Z59.9      Plan:  Continuing safety and recovery plan -- Ongoing pledge of no harm, will use crisis call service or present to hospital if unable to guarantee safety.  Recognize SI as a way of getting her own attention on feeling overwhelmed and prioritize actions to mitigate stress, find support, address material issues in health and life administration.  Ongoing availability on wait list for earlier appts.  As a complex care patient, best that she obtain referral to nurse or social worker in one or the other health system (either [facility] or [facility] based on her medical issues).  Should involve Medicaid case manager and investigate residential and DBT  options. Health -- Keep up with recommended referrals and assessments, treatments for pain, kidney care, and GI distress.  Recommend common vitamin supplements if able for neurological health, including B complex and D.  Advice against smoking and sodas for general health, kidney protection, and gastric irritation. Anxiety/panic management and sleep -- Pursue/maintain appropriate sleep medication, refrain from up-dosing and stacking because it just won't work, it will only tease her into being more anxious.  Practice allowing sleep and consciously settling for doing all she can today but rest for the next.  As needed, self-affirm her disability inquiry is only to direct next actions updating her claim, not a prelude to putting her on the street, and if further info is needed, we will address it in turn.  Where possible, continue to practice meditation and calming skills at interest, to proficiency in reducing experienced distress.   Trauma -- Practice acknowledging when intrusive memories occur, acknowledging horrors that happen, and self-validating how she made it out.  Practice acknowledging to loved ones when a moment is shadowed by bad memory and she may need relief.  Advise Al-Anon or a related fellowship, either online or in person (better) to improve sense of camaraderie and support. Relationship with  adult children -- Pay close attention to open, trusting, revealing behavior and take heart.  Actively dispute catastrophic ideas of what will happen to Summer, or their relationship, and recognize PTSD imagination in progress, dispute.  Reaffirm it is developmentally Summer's business to work out for herself what her boundaries will be with peers and father.  Practice trust that Summer will use her independent mind to figure it out without wrecking her life or taking [name]'s perceived bait in the process, and that she will just as vigorously defend herself with anyone else as she does her mother if she sees  the need, including her father and her counselor.  Take any harsh utterances with as much salt as possible, because they are viciously stated for effects, not facts, and often enough can be construed as backhanded compliments (i.e., evidence of trust, actually).  OK to confront rudeness if it comes, or ask if she meant something the way it sounds, but may be more worth it to let it pass with grace and focus on controlling one's own tendencies to catastrophize and overtalk.  Advocate timeout agreement, prepared when calm, if things turns harsh.  Ongoing advice to firmly refuse her own impulses to irritating behaviors like overexplaining, overworry, over-probing, or trying in any way to lobby Summer's choices without first getting her explicit consent to challenge something. FOO issues -- May consult birth mother and stepmother at will, but also OK not to press it if what she gets is further distressing.  Monitor for inadvertently alarmist advice and be willing to tamp it down for herself.  Explore at discretion only if cogent and careful enough to avoid being triggered to paranoia, or compulsive agreement with wild ideas, or an obligation to swallow underinformed views.  Maintain her own authority to decide how to approach it and when to refuse torturous boundaries.  Stay very realistic about expecting a maturity she can depend on from birth mother and her nominal sisters, all of whom have their own problems and blind spots, and none of whom have a true longterm family relationship with her.  Actively dispute any idea that she is rejected or indicted by people who honestly don't know her that well.  Take all family advice with skepticism, stay ready to recognize when urgency to help will jump to conclusions and make hasty judgments.  Stay ready to write off bad reactions as either too agenda-driven or just too ignorant about her to see the better.   Poverty -- Continue to endorse SSDI as she is unable to hold  any gainful employment with a slew of medical and emotional conditions.  Financial counseling as available, otherwise notice and flag any overly expensive items.  Ongoing encouragement to use, and ask about, patient resources, Medicaid and Medicare rights and privileges, and utilize social work (e.g., Medicaid/[employer]) rather than make helpless assumptions. Other recommendations/advice -- As may be noted above.  Continue to utilize previously learned skills ad lib. Medication compliance -- Maintain medication as prescribed and work faithfully with relevant prescriber(s) if any changes are desired or seem indicated. Crisis service -- Aware of call list and work-in appts.  Call the clinic on-call service, 988/hotline, 911, or present to [facility] or ER if any life-threatening psychiatric crisis. Followup -- Return for time as already scheduled, avail earlier @ PT's need.  Next scheduled visit with me 07/25/2024.  Next scheduled in this office 07/25/2024.  Lamar Kendall, PhD [name], PhD LP Clinical Psychologist, [facility] [address], [address] [place], [place] [address] [phone]

## 2024-07-16 ENCOUNTER — Ambulatory Visit (INDEPENDENT_AMBULATORY_CARE_PROVIDER_SITE_OTHER): Admitting: Psychiatry

## 2024-07-16 DIAGNOSIS — F4312 Post-traumatic stress disorder, chronic: Secondary | ICD-10-CM | POA: Diagnosis not present

## 2024-07-16 DIAGNOSIS — Z599 Problem related to housing and economic circumstances, unspecified: Secondary | ICD-10-CM

## 2024-07-16 DIAGNOSIS — F331 Major depressive disorder, recurrent, moderate: Secondary | ICD-10-CM

## 2024-07-16 DIAGNOSIS — G8929 Other chronic pain: Secondary | ICD-10-CM | POA: Diagnosis not present

## 2024-07-16 DIAGNOSIS — F4001 Agoraphobia with panic disorder: Secondary | ICD-10-CM | POA: Diagnosis not present

## 2024-07-16 NOTE — Progress Notes (Signed)
 Psychotherapy Progress Note [facility] [name], PhD LP  Patient ID: Deanna George)    MRN: [id number] Therapy format: Individual psychotherapy Date: 07/16/2024      Start: 5:26p     Stop: 6:16p     Time Spent: 50 min Location: Telehealth visit -- I connected with this patient by an approved telecommunication method (video), with her informed consent, and verifying identity and patient privacy.  I was located at my office and patient at her home.  As needed, we discussed the limitations, risks, and security and privacy concerns associated with telehealth service, including the availability and conditions which currently govern in-person appointments and the possibility that 3rd-party payment may not be fully guaranteed and she may be responsible for charges.  After she indicated understanding, we proceeded with the session.  Also discussed treatment planning, as needed, including ongoing verbal agreement with the plan, the opportunity to ask and answer all questions, her demonstrated understanding of instructions, and her readiness to call the office should symptoms worsen or she feels she is in a crisis state and needs more immediate and tangible assistance.   Session narrative (presenting needs, interim history, self-report of stressors and symptoms, applications of prior therapy, status changes, and interventions made in session) Scheduled urgently for an emergency appt this afternoon.  Reached in the midst of a customer service call with [facility] psychiatry nurse, by whom she was just notified of a lost ECT opportunity she was counting on for tomorrow.  Reports she has been in a rolling panic the last 3 days.  Prone to cast aspersions on the [facility] service as discounting her, message taken that she is not worth the consideration, being a Medicaid patient.  Encouraged to take those thoughts with a grain of salt, and recognize the good efforts under way for customer service  and try to suspend judgment, the way she wishes others had done for her, and just work the problem at hand.  Reports the trigger to panic came with having septic service the other day, which was able to clean her pipes but informed her she has a failing leach field.  That is the landlord's Film/video editor) responsibility, and he is in that business, actually, but strong perception so far that he means to do as little as possible, and the terms of the rental agreement are almost entirely low price for low expectations.  Further discovery in the process that the land she's on shouldn't never have been built on, since it really doesn't perk.  Reckoning with the dark side of why she has a very low lot rental.  Has come to some decision she will sell her mobile home, but without knowing yet what her rehoming plan should or can be.  Borrowing trouble in her imagination with such images as Ray resisting her even having buyers come onto the land to look.  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  Other concerns used to include a dangerous seeming drug addict neighbor, but he is gone.  All about dealing with her home situation now, which was excruciating and lonely working out several years ago.  Tempted to freak out about finding next place.    Oriented her to the actual, rather than imagined, workings of subsidized housing, and attempted to assure her that it's a pretty direct line for her  to obtain DSS help and/or affordable housing help if she'll consent to make the call and deal with introducing herself to one more helper she does not know.  Agree with her first instinct to contact her Medicaid case worker, who should be able to connect to all other needful things.  Meanwhile, worth asking Ray about alternative arrangements, like another lot, or an alternative sewage mechanism.   Probed how she would feel if she received a DIY offer from Ray to, say, tow her mobile for $75 and set up a pipe to  creek, if it would buy her time.  Looked up Section 8 standards to reality-check fears about exorbitant costs (30% of her income, period).  Notes another new medical diagnosis, meanwhile, and is losing another pet.  Azzy, a 17yo cat, now est 1 mo to live, and a constant through most of her journey out of a torturous marriage, contentious divorce, and single parenting.  Agreed she will make a priority of calling her case worker to get fact-finding started, provided language how to present her need, and offered a coaching call in 2 days to check on her.   Therapeutic modalities: Cognitive Behavioral Therapy, Solution-Oriented/Positive Psychology, Ego-Supportive, Assertiveness/Communication, and directive  Mental Status/Observations:  Appearance:   Casual     Behavior:  Appropriate  Motor:  Normal  Speech/Language:   Clear and Coherent  Affect:  Appropriate  Mood:  anxious  Thought process:  Prone to catastrophizing  Thought content:    Paranoid Ideation  Sensory/Perceptual disturbances:    WNL  Orientation:  Fully oriented  Attention:  Good    Concentration:  Fair  Memory:  WNL  Insight:    Fair  Judgment:   Fair  Impulse Control:  Good   Risk Assessment: Danger to Self: No Self-injurious Behavior: No Danger to Others: No Physical Aggression / Violence: No Duty to Warn: No Access to Firearms a concern: No  Assessment of progress:  situational setback(s)  Diagnosis:   ICD-10-CM   1. Major depressive disorder, recurrent episode, moderate (HCC)  F33.1     2. Panic disorder with agoraphobia  F40.01     3. Chronic post-traumatic stress disorder (PTSD) with panic attacks and generalized anxiety  F43.12     4. Other chronic pain - multiple causes incl fibromyalgia and internal organ issues  G89.29     5. Financial difficulties  Z59.9      Plan:  Continuing  safety and recovery plan -- Ongoing pledge of no harm, will use crisis call service or present to hospital if unable to guarantee safety.  Recognize SI as a way of getting her own attention on feeling overwhelmed and prioritize actions to mitigate stress, find support, address material issues in health and life administration.  Ongoing availability on wait list for earlier appts.  As a complex care patient, best that she obtain referral to nurse or social worker in one or the other health system (either [facility] or [facility] based on her medical issues).  Should involve Medicaid case manager and investigate residential and DBT options. Health -- Keep up with recommended referrals and assessments, treatments for pain, kidney care, and GI distress.  Recommend common vitamin supplements if able for neurological health, including B complex and D.  Advice against smoking and sodas for general health, kidney protection, and gastric irritation. Anxiety/panic management and sleep -- Pursue/maintain appropriate sleep medication, refrain from up-dosing and stacking because it just won't work, it will only tease her into being more  anxious.  Practice allowing sleep and consciously settling for doing all she can today but rest for the next.  As needed, self-affirm her disability inquiry is only to direct next actions updating her claim, not a prelude to putting her on the street, and if further info is needed, we will address it in turn.  Where possible, continue to practice meditation and calming skills at interest, to proficiency in reducing experienced distress.   Trauma -- Practice acknowledging when intrusive memories occur, acknowledging horrors that happen, and self-validating how she made it out.  Practice acknowledging to loved ones when a moment is shadowed by bad memory and she may need relief.  Advise Al-Anon or a related fellowship, either online or in person (better) to improve sense of camaraderie and  support. Relationship with adult children -- Pay close attention to open, trusting, revealing behavior and take heart.  Actively dispute catastrophic ideas of what will happen to Summer, or their relationship, and recognize PTSD imagination in progress, dispute.  Reaffirm it is developmentally Summer's business to work out for herself what her boundaries will be with peers and father.  Practice trust that Summer will use her independent mind to figure it out without wrecking her life or taking [name]'s perceived bait in the process, and that she will just as vigorously defend herself with anyone else as she does her mother if she sees the need, including her father and her counselor.  Take any harsh utterances with as much salt as possible, because they are viciously stated for effects, not facts, and often enough can be construed as backhanded compliments (i.e., evidence of trust, actually).  OK to confront rudeness if it comes, or ask if she meant something the way it sounds, but may be more worth it to let it pass with grace and focus on controlling one's own tendencies to catastrophize and overtalk.  Advocate timeout agreement, prepared when calm, if things turns harsh.  Ongoing advice to firmly refuse her own impulses to irritating behaviors like overexplaining, overworry, over-probing, or trying in any way to lobby Summer's choices without first getting her explicit consent to challenge something. FOO issues -- May consult birth mother and stepmother at will, but also OK not to press it if what she gets is further distressing.  Monitor for inadvertently alarmist advice and be willing to tamp it down for herself.  Explore at discretion only if cogent and careful enough to avoid being triggered to paranoia, or compulsive agreement with wild ideas, or an obligation to swallow underinformed views.  Maintain her own authority to decide how to approach it and when to refuse torturous boundaries.  Stay very  realistic about expecting a maturity she can depend on from birth mother and her nominal sisters, all of whom have their own problems and blind spots, and none of whom have a true longterm family relationship with her.  Actively dispute any idea that she is rejected or indicted by people who honestly don't know her that well.  Take all family advice with skepticism, stay ready to recognize when urgency to help will jump to conclusions and make hasty judgments.  Stay ready to write off bad reactions as either too agenda-driven or just too ignorant about her to see the better.   Poverty -- Continue to endorse SSDI as she is unable to hold any gainful employment with a slew of medical and emotional conditions.  Financial counseling as available, otherwise notice and flag any overly expensive items.  Ongoing  encouragement to use, and ask about, patient resources, Medicaid and Medicare rights and privileges, and utilize social work (e.g., Medicaid/[employer] coach) rather than make helpless assumptions. Other recommendations/advice -- As may be noted above.  Continue to utilize previously learned skills ad lib. Medication compliance -- Maintain medication as prescribed and work faithfully with relevant prescriber(s) if any changes are desired or seem indicated. Crisis service -- Aware of call list and work-in appts.  Call the clinic on-call service, 988/hotline, 911, or present to [facility] or ER if any life-threatening psychiatric crisis. Followup -- Return for time as already scheduled, avail earlier @ PT's need.  Next scheduled visit with me 07/25/2024.  Next scheduled in this office 07/25/2024.  Lamar Kendall, PhD [name], PhD LP Clinical Psychologist, [facility] [address], [address] [place], [place] [address] [phone]

## 2024-07-18 ENCOUNTER — Telehealth: Payer: Self-pay | Admitting: Psychiatry

## 2024-07-18 NOTE — Telephone Encounter (Signed)
 Admin note for unbilled service  Patient ID: Deanna George  MRN: [id number] DATE: 07/18/2024  Outreach call, as planned Wed, to check on how things worked out calling her [employer] case worker and/or DSS.  Surprised by the call, did not recall, had ECT today, which was apparently more her monofocus by the time she slept We night and woke yesterday to the same slate of paralyzing worries.  Affirmed getting the cancel slot today and getting treatment, reminded of the housing issue and plans to call, recommitted to make the call either this weekend or when business opens Monday, to specifically ask for help figuring out backup plan for housing (Section 8 subsidized, or affordable trailer park, aversion to appearances may turn out to be an issue, but finding out what available options there actually are is the first key to un-panicking).  Sched for therapy f/u Friday 9/12.  Lamar Kendall, PhD [name], PhD LP Clinical Psychologist, [facility] [address] [place], [place] [address] [phone]

## 2024-07-25 ENCOUNTER — Ambulatory Visit: Admitting: Psychiatry

## 2024-08-07 ENCOUNTER — Ambulatory Visit: Admitting: Psychiatry

## 2024-08-07 DIAGNOSIS — Z599 Problem related to housing and economic circumstances, unspecified: Secondary | ICD-10-CM

## 2024-08-07 DIAGNOSIS — F4312 Post-traumatic stress disorder, chronic: Secondary | ICD-10-CM

## 2024-08-07 DIAGNOSIS — F447 Conversion disorder with mixed symptom presentation: Secondary | ICD-10-CM

## 2024-08-07 DIAGNOSIS — F331 Major depressive disorder, recurrent, moderate: Secondary | ICD-10-CM

## 2024-08-07 DIAGNOSIS — F5104 Psychophysiologic insomnia: Secondary | ICD-10-CM

## 2024-08-07 NOTE — Progress Notes (Signed)
 Psychotherapy Progress Note [facility] [name], PhD LP  Patient ID: Deanna George)    MRN: [id number] Therapy format: Individual psychotherapy Date: 08/07/2024      Start: 2:15p     Stop: 3:05p     Time Spent: 50 min Location: Telehealth visit -- I connected with this patient by an approved telecommunication method (video), with her informed consent, and verifying identity and patient privacy.  I was located at my office and patient at her home.  As needed, we discussed the limitations, risks, and security and privacy concerns associated with telehealth service, including the availability and conditions which currently govern in-person appointments and the possibility that 3rd-party payment may not be fully guaranteed and she may be responsible for charges.  After she indicated understanding, we proceeded with the session.  Also discussed treatment planning, as needed, including ongoing verbal agreement with the plan, the opportunity to ask and answer all questions, her demonstrated understanding of instructions, and her readiness to call the office should symptoms worsen or she feels she is in a crisis state and needs more immediate and tangible assistance.   Session narrative (presenting needs, interim history, self-report of stressors and symptoms, applications of prior therapy, status changes, and interventions made in session) Apologetic for cancellation last time -- had urgent ETC.  Helps depression but not anxiety.  Agreed that solving the actual life problems is what will help anxiety.  Thinning out the schedule at this point.    Wants to consult Dr. Mozingo about upping something to cope better with anxiety.  Reminded she is not getting full benefit of what she has because of persistent nausea and aversion to swallowing pills in the first place.  States clearly that she has been off duloxetine  6 weeks -- couldn't feel a difference, she's viscerally affected by  the variety of pills she is supposed to take, and morning pill-taking is much worse affected.  Working theories that her pancreatic problem and maybe irritated stomach lining account for some of her reactions, Functional Neurological Symptom D/O for some, but night time seems to work more reliably.  Getting olanzapine  and prazosin  down fine, and 20mg  Prozac  allegedly gets in, but also out of clonazepam  for over 2 weeks now.  Discussed adjustments might be made (consolidate antidepressants to just Prozac  but a higher dose, empty caps in something palatable like her instant breakfast drink, maybe convert something to a patch).  Has been able to use an olanzapine  at times by day while out of clonazepam .  Psychiatry messaged in session, replied afterward -- RF clonazepam  sent in, advice that Pt can move morning meds to night, and request psychiatry convey the messages.  Re housing insecurity -- septic situation is stable, no new issues but still the service man's word that her system is on its last legs and cannot be expected to hold up.  Landlord Ray -- who is in the septic business himself -- walked her around her yard to educate her about the system and show her why she has more time than that, but she is persistently apprehensive about trusting anyone when it comes to her housing security, partly for knowing this is a low-cost, cash situation without oversight, partly for having no idea who she deals with of Ray dies or falls ill, partly for paralyzing fear of dealing with new people.  Completely forgot the detailed agreement from a couple sessions ago that she would contact her case worker to learn how subsidized  housing could work for her if needed, and that her cost would be 30% of her SSA (mid-high $200s).  Discussed and strongly encouraged texting Ray to say she was thinking more about the septic situation and wants to just confirm what when she should call him and who she should call if he's not available.   Still very averse to it, bargaining to wait till she sees him.  Supportively challenged avoidance, reminded her he cares enough to show her things, he won't mind, and he's not the man who told her to stay under the covers and don't move all day.  Also encouraged to talk with her case worker to start finding out what her resources are if she needs to have a contingency plan for housing.  Put down a number of presumptions about what kind of awful place she might have to go to and reminded her we made this plan a few weeks ago, she was willing then, before she panicked and blocked it out, and she cannot help but have less anxiety once she knows some things instead of assumes the worse.  Admittedly anxious trying to do that, prone to scattering, but recommits.  Therapeutic modalities: Cognitive Behavioral Therapy, Solution-Oriented/Positive Psychology, Ego-Supportive, and Assertiveness/Communication  Mental Status/Observations:  Appearance:   Casual     Behavior:  Appropriate  Motor:  Normal  Speech/Language:   Clear and Coherent  Affect:  Appropriate  Mood:  anxious  Thought process:  Minor blocking  Thought content:    Obsessions  Sensory/Perceptual disturbances:    WNL  Orientation:  Fully oriented  Attention:  Good    Concentration:  Good  Memory:  Affected by repression and anxiety  Insight:    Fair  Judgment:   Variable  Impulse Control:  Good   Risk Assessment: Danger to Self: No Self-injurious Behavior: No Danger to Others: No Physical Aggression / Violence: No Duty to Warn: No Access to Firearms a concern: No  Assessment of progress:  stabilized  Diagnosis:   ICD-10-CM   1. Major depressive disorder, recurrent episode, moderate (HCC)  F33.1     2. Chronic post-traumatic stress disorder (PTSD) with panic attacks and generalized anxiety  F43.12     3. Functional neurological symptom disorder with mixed symptoms  F44.7    by self-report hx of neurology    4. Financial  difficulties  Z59.9     5. 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  Recognize SI as a way of getting her own attention on feeling overwhelmed and prioritize actions to mitigate stress, find support, address material issues in health and life administration.  Ongoing availability on wait list for earlier appts.  As a complex care patient, best that she obtain referral to nurse or social worker in one or the other health system (either [facility] or [facility] based on her medical issues).  Should involve Medicaid case manager and investigate residential and DBT options. Health -- Keep up with recommended referrals and assessments, treatments for pain, kidney care, and GI distress.  Recommend common vitamin supplements if able for neurological health, including B complex and D.  Advice against smoking and sodas for general health, kidney protection, and gastric irritation. Anxiety/panic management and sleep -- Pursue/maintain appropriate sleep medication, refrain from up-dosing and stacking because it just won't  work, it will only tease her into being more anxious.  Practice allowing sleep and consciously settling for doing all she can today but rest for the next.  As needed, self-affirm her disability inquiry is only to direct next actions updating her claim, not a prelude to putting her on the street, and if further info is needed, we will address it in turn.  Where possible, continue to practice meditation and calming skills at interest, to proficiency in reducing experienced distress.   Trauma -- Practice acknowledging when intrusive memories occur, acknowledging horrors that happen, and self-validating how she made it out.  Practice acknowledging to loved ones when a moment is shadowed by bad memory and she may need relief.  Advise Al-Anon or a  related fellowship, either online or in person (better) to improve sense of camaraderie and support. Relationship with adult children -- Pay close attention to open, trusting, revealing behavior and take heart.  Actively dispute catastrophic ideas of what will happen to Summer, or their relationship, and recognize PTSD imagination in progress, dispute.  Reaffirm it is developmentally Summer's business to work out for herself what her boundaries will be with peers and father.  Practice trust that Summer will use her independent mind to figure it out without wrecking her life or taking [name]'s perceived bait in the process, and that she will just as vigorously defend herself with anyone else as she does her mother if she sees the need, including her father and her counselor.  Take any harsh utterances with as much salt as possible, because they are viciously stated for effects, not facts, and often enough can be construed as backhanded compliments (i.e., evidence of trust, actually).  OK to confront rudeness if it comes, or ask if she meant something the way it sounds, but may be more worth it to let it pass with grace and focus on controlling one's own tendencies to catastrophize and overtalk.  Advocate timeout agreement, prepared when calm, if things turns harsh.  Ongoing advice to firmly refuse her own impulses to irritating behaviors like overexplaining, overworry, over-probing, or trying in any way to lobby Summer's choices without first getting her explicit consent to challenge something. FOO issues -- May consult birth mother and stepmother at will, but also OK not to press it if what she gets is further distressing.  Monitor for inadvertently alarmist advice and be willing to tamp it down for herself.  Explore at discretion only if cogent and careful enough to avoid being triggered to paranoia, or compulsive agreement with wild ideas, or an obligation to swallow underinformed views.  Maintain her own  authority to decide how to approach it and when to refuse torturous boundaries.  Stay very realistic about expecting a maturity she can depend on from birth mother and her nominal sisters, all of whom have their own problems and blind spots, and none of whom have a true longterm family relationship with her.  Actively dispute any idea that she is rejected or indicted by people who honestly don't know her that well.  Take all family advice with skepticism, stay ready to recognize when urgency to help will jump to conclusions and make hasty judgments.  Stay ready to write off bad reactions as either too agenda-driven or just too ignorant about her to see the better.   Poverty -- Continue to endorse SSDI as she is unable to hold any gainful employment with a slew of medical and emotional conditions.  Financial counseling as available, otherwise  notice and flag any overly expensive items.  Ongoing encouragement to use, and ask about, patient resources, Medicaid and Medicare rights and privileges, and utilize social work (e.g., Medicaid/[employer] coach) rather than make helpless assumptions. Other recommendations/advice -- As may be noted above.  Continue to utilize previously learned skills ad lib. Medication compliance -- Maintain medication as prescribed and work faithfully with relevant prescriber(s) if any changes are desired or seem indicated. Crisis service -- Aware of call list and work-in appts.  Call the clinic on-call service, 988/hotline, 911, or present to [facility] or ER if any life-threatening psychiatric crisis. Followup -- Return for time as already scheduled, avail earlier @ PT's need.  Next scheduled visit with me 08/21/2024.  Next scheduled in this office 08/21/2024.  Lamar Kendall, PhD [name], PhD LP Clinical Psychologist, [facility] [address], [address] [place], [place] [address] [phone]

## 2024-08-08 ENCOUNTER — Other Ambulatory Visit: Payer: Self-pay | Admitting: Adult Health

## 2024-08-08 DIAGNOSIS — F411 Generalized anxiety disorder: Secondary | ICD-10-CM

## 2024-08-08 MED ORDER — CLONAZEPAM 1 MG PO TABS: 1.0000 mg | ORAL_TABLET | Freq: Four times a day (QID) | ORAL | 2 refills | Status: AC | PRN

## 2024-08-13 NOTE — Telephone Encounter (Signed)
 Discussed with CMA difficulty reaching patient -- in light of ongoing difficulties with short term memory, organization, and anxious interference, advise contacting designated party (Summer) to let her know we are trying to reach her mother with important updates.  OK to be specific (e.g., can move morning meds to nighttime if they will swallow better, clonazepam  refill has been called in for several days, and any other advice about dosing and scheduling to overcome difficulty swallowing antianxiety meds).  From therapy/social work perspective, important to follow through on recommended communications with landlord and [employer] coach, no matter what anxiety arises.  -- AM

## 2024-08-21 ENCOUNTER — Ambulatory Visit: Admitting: Psychiatry

## 2024-08-21 DIAGNOSIS — F4312 Post-traumatic stress disorder, chronic: Secondary | ICD-10-CM

## 2024-08-21 DIAGNOSIS — Z599 Problem related to housing and economic circumstances, unspecified: Secondary | ICD-10-CM

## 2024-08-21 DIAGNOSIS — F411 Generalized anxiety disorder: Secondary | ICD-10-CM

## 2024-08-21 DIAGNOSIS — F5104 Psychophysiologic insomnia: Secondary | ICD-10-CM

## 2024-08-21 DIAGNOSIS — R1319 Other dysphagia: Secondary | ICD-10-CM

## 2024-08-21 DIAGNOSIS — F447 Conversion disorder with mixed symptom presentation: Secondary | ICD-10-CM

## 2024-08-21 DIAGNOSIS — F331 Major depressive disorder, recurrent, moderate: Secondary | ICD-10-CM | POA: Diagnosis not present

## 2024-08-21 NOTE — Progress Notes (Signed)
 Psychotherapy Progress Note [facility] [name], PhD LP  Patient ID: LADIAMOND GALLINA)    MRN: [id number] Therapy format: Individual psychotherapy Date: 08/21/2024      Start: 2:10p     Stop: 3:10p     Time Spent: 60 min Location: Telehealth visit -- I connected with this patient by an approved telecommunication method (video), with her informed consent, and verifying identity and patient privacy.  I was located at my office and patient at her home.  As needed, we discussed the limitations, risks, and security and privacy concerns associated with telehealth service, including the availability and conditions which currently govern in-person appointments and the possibility that 3rd-party payment may not be fully guaranteed and she may be responsible for charges.  After she indicated understanding, we proceeded with the session.  Also discussed treatment planning, as needed, including ongoing verbal agreement with the plan, the opportunity to ask and answer all questions, her demonstrated understanding of instructions, and her readiness to call the office should symptoms worsen or she feels she is in a crisis state and needs more immediate and tangible assistance.   Session narrative (presenting needs, interim history, self-report of stressors and symptoms, applications of prior therapy, status changes, and interventions made in session) Did not reach her case manager but did receive 2 letters about being dropped from case management for them not being able to contact her.  Figured out in this meeting that she probably still has Do Not Disturb programmed from April, she probably has full voicemail, and she needs to white list health care numbers, all of which can be accomplished with the help of her kids.  (Offer made to call her case manager on her behalf to help notify of the problems and her wish to remain connected to services.  Message left 10/10, for Dena, at [facility], [phone].)    Also apprised Pt of medical advice from 2 weeks ago concerning her medication difficulties, mainly advice to try taking morning meds later on, when better able to swallow and keep down.  Says she already tried and was able to get duloxetine  in for 3 days, then trouble flared again.  Repeat endoscopy coming on 10/24, which she thinks might be able to place a pancreatic stent, not sure.  Reviewed working theories on her nausea -- pancreatic issue spoiling delivery of digestive enzymes, possible gastritis (but still 7 months off cigarettes), and possible functional neuro sxs (more or less like IBS of the stomach, or more of a conversion reaction, resisting the invasion of pills and/or the benefit of them when part of her would rather stop living.  To her credit, she did summon herself to go in person to [facility], and got her application done, with in-person help.  While on this call, looked up benefit and contact info for the [employer] program and sent her contacts via direct email, along with referral info for a clearinghouse website run by the [employer] on Aging.  Brings up the problem of persistent susceptibility to anxiety and motivational paralysis.  Undoubtedly attributable in part to insufficient medication and maybe still dealing with clonazepam  supply problem.  Reports watching the same 4 shows, avoiding all news, showering about every 5 days, procrastinating all manner of housekeeping, with dishes reaching a week old, and tremoring worse lately to the point of difficulty writing or driving.  Led to give her sink a look while on video call, consider washing one dish together, but opted  to do afterward.  Coached in overall approach of breaking down tasks into small enough commitments whenever she is trying to get past the should/can't dilemma.  That, and changing her attention to get off her thoughts and into her senses, i.e., mindful housekeeping.  Briefed on the [facility] method.  As for persistent, intermittent, intrusive thoughts of wanting to die, acknowledges they are not actively suicidal, still just a wish to be relieved, but they can be repetitive, and a kind of drumbeat behind her daily life.  Encouraged to acknowledge them as a kind of check engine light rather than a struggle for morals or will to live, a cue that her brain wants her to address something painful in any way possible.  On a lighter note, suggested that the intrusive thought comes from an important department in her brain, one we all have, but it usually works in the background, called [employer], and its job naturally is to come up with extreme measures to put on the list of things we can do about things we face.  Her problem is not that she has an actual death wish, or means to nurse it, but that she is positioned close to the [facility] somehow and has to overhear it often.  Recommendation is to acknowledge the work of the [facility] (Thanks for sharing) and let them know you'll get back to them if their idea is needed.  Meanwhile see about doing the next decent thing in today's living, like attack a bit of housekeeping, or find out how Section 8 or emergency housing would work if she comes to need it.  Loves the idea.  Affirmed and encouraged breaking ice on her backed up dishes, getting phone settings rehabbed, reaching her case manager, obtaining how-to info on emergency housing, and applying the [facility] image.  Therapeutic modalities: Cognitive Behavioral Therapy, Solution-Oriented/Positive Psychology, and Ego-Supportive  Mental Status/Observations:  Appearance:   Casual     Behavior:  Appropriate  Motor:  Normal  Speech/Language:   Clear and Coherent  Affect:  Appropriate  Mood:  anxious  Thought process:  normal  Thought content:    Obsessions  Sensory/Perceptual disturbances:    WNL  Orientation:  Fully oriented  Attention:  Good    Concentration:  Good  Memory:   Variable  Insight:    Variable  Judgment:   Fair  Impulse Control:  Good   Risk Assessment: Danger to Self: No Self-injurious Behavior: No Danger to Others: No Physical Aggression / Violence: No Duty to Warn: No Access to Firearms a concern: No  Assessment of progress:  progressing  Diagnosis:   ICD-10-CM   1. Major depressive disorder, recurrent episode, moderate (HCC)  F33.1     2. Generalized anxiety disorder  F41.1     3. Chronic post-traumatic stress disorder (PTSD) with panic attacks and generalized anxiety  F43.12     4. 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 Functional neurological symptom disorder with mixed symptoms  F44.7     6. Financial difficulties  Z59.9     7. OTHER DYSPHAGIA  R13.19      Plan:  Continuing safety and recovery plan -- Ongoing pledge of no harm, will use crisis call service or present to hospital if unable to guarantee safety.  Recognize SI as a way of getting her own attention on feeling overwhelmed and prioritize actions to mitigate stress, find support, address material issues in health and  life administration.  Ongoing availability on wait list for earlier appts.  As a complex care patient, best that she obtain referral to nurse or social worker in one or the other health system (either [facility] or [facility] based on her medical issues).  Should involve Medicaid case manager and investigate residential and DBT options. Health -- Keep up with recommended referrals and assessments, treatments for pain, kidney care, and GI distress.  Recommend common vitamin supplements if able for neurological health, including B complex and D.  Advice against smoking and sodas for general health, kidney protection, and gastric irritation. Anxiety/panic management and sleep -- Pursue/maintain appropriate sleep medication, refrain from up-dosing and stacking because it just won't work, it will only tease her into being more anxious.  Practice allowing sleep  and consciously settling for doing all she can today but rest for the next.  As needed, self-affirm her disability inquiry is only to direct next actions updating her claim, not a prelude to putting her on the street, and if further info is needed, we will address it in turn.  Where possible, continue to practice meditation and calming skills at interest, to proficiency in reducing experienced distress.   Trauma -- Practice acknowledging when intrusive memories occur, acknowledging horrors that happen, and self-validating how she made it out.  Practice acknowledging to loved ones when a moment is shadowed by bad memory and she may need relief.  Advise Al-Anon or a related fellowship, either online or in person (better) to improve sense of camaraderie and support. Relationship with adult children -- Pay close attention to open, trusting, revealing behavior and take heart.  Actively dispute catastrophic ideas of what will happen to Summer, or their relationship, and recognize PTSD imagination in progress, dispute.  Reaffirm it is developmentally Summer's business to work out for herself what her boundaries will be with peers and father.  Practice trust that Summer will use her independent mind to figure it out without wrecking her life or taking [name]'s perceived bait in the process, and that she will just as vigorously defend herself with anyone else as she does her mother if she sees the need, including her father and her counselor.  Take any harsh utterances with as much salt as possible, because they are viciously stated for effects, not facts, and often enough can be construed as backhanded compliments (i.e., evidence of trust, actually).  OK to confront rudeness if it comes, or ask if she meant something the way it sounds, but may be more worth it to let it pass with grace and focus on controlling one's own tendencies to catastrophize and overtalk.  Advocate timeout agreement, prepared when calm, if things  turns harsh.  Ongoing advice to firmly refuse her own impulses to irritating behaviors like overexplaining, overworry, over-probing, or trying in any way to lobby Summer's choices without first getting her explicit consent to challenge something. FOO issues -- May consult birth mother and stepmother at will, but also OK not to press it if what she gets is further distressing.  Monitor for inadvertently alarmist advice and be willing to tamp it down for herself.  Explore at discretion only if cogent and careful enough to avoid being triggered to paranoia, or compulsive agreement with wild ideas, or an obligation to swallow underinformed views.  Maintain her own authority to decide how to approach it and when to refuse torturous boundaries.  Stay very realistic about expecting a maturity she can depend on from birth mother and her nominal  sisters, all of whom have their own problems and blind spots, and none of whom have a true longterm family relationship with her.  Actively dispute any idea that she is rejected or indicted by people who honestly don't know her that well.  Take all family advice with skepticism, stay ready to recognize when urgency to help will jump to conclusions and make hasty judgments.  Stay ready to write off bad reactions as either too agenda-driven or just too ignorant about her to see the better.   Poverty -- Continue to endorse SSDI as she is unable to hold any gainful employment with a slew of medical and emotional conditions.  Financial counseling as available, otherwise notice and flag any overly expensive items.  Ongoing encouragement to use, and ask about, patient resources, Medicaid and Medicare rights and privileges, and utilize social work (e.g., Medicaid/[employer]) rather than make helpless assumptions. Other recommendations/advice -- As may be noted above.  Continue to utilize previously learned skills ad lib. Medication compliance -- Maintain medication as  prescribed and work faithfully with relevant prescriber(s) if any changes are desired or seem indicated. Crisis service -- Aware of call list and work-in appts.  Call the clinic on-call service, 988/hotline, 911, or present to [facility] or ER if any life-threatening psychiatric crisis. Followup -- Return for time as available, put on CA list.  Next scheduled visit with me Visit date not found.  Next scheduled in this office Visit date not found.  Addendum: As of 08/28/24, Pt has not scheduled yet for further Tx or Rx visits.  Presumed to still be overwhelmed managing multiple healthcare visits, financial concerns, and surging anxiety about all, despite breakthrough last week.  Consulted with psychiatry, have asked staff to put her on the CA list and messaged via MyChart, which she will hopefully encounter via email.  Lamar Kendall, PhD [name], PhD LP Clinical Psychologist, [facility] [address], [address] [place], [place] [address] [phone]

## 2024-09-15 ENCOUNTER — Ambulatory Visit: Admitting: Dietician

## 2024-09-22 ENCOUNTER — Telehealth: Payer: Self-pay | Admitting: Psychiatry

## 2024-09-22 NOTE — Telephone Encounter (Signed)
 Admin note for unbilled service  Patient ID: Deanna George  MRN: [id number] DATE: 09/22/2024  Per staff, Pt has scheduled for this coming Friday 1pm.  Been away from the house through the weekend after having to put her cat down, stayed with Summer to stabilize and hold off really encountering home without Azzie's handy comfort and presence.  Is using whatever meds she can to calm and steady now home, and is content with being seen Friday, or if something comes open earlier.  Has PCP tomorrow.  Confirms she still has trouble swallowing all pills, but she is semiregular, she remembers advice to schedule antidepressant for when she cn keep it down, and she has supply right now.  As also taken care of scheduling a ways, having mistakenly thought she had the rest of the calendar year set up.  (Since last contact was a month ago, with 2 unanswered MyChart messages inquiring about her scheduling, it does seem that she has remained powerfully preoccupied.)  Confirmed safe till Friday, at which time she will also want help stepping through an online response to jury duty that has panicked her, with typical presumptions that she will have to fulfill responsibilities that seem impossible -- assured we can work through that in short order, and agreed we can get online together at that point to ensure she comprehends and can respond competently, or, if suits, just issue an excuse letter and she can handle it the pre-digital way.  Lamar Kendall, PhD [name], PhD LP Clinical Psychologist, [facility] [address], [address] [place], [place] [address] [phone]

## 2024-09-26 ENCOUNTER — Ambulatory Visit: Admitting: Psychiatry

## 2024-09-26 DIAGNOSIS — F447 Conversion disorder with mixed symptom presentation: Secondary | ICD-10-CM | POA: Diagnosis not present

## 2024-09-26 DIAGNOSIS — F4312 Post-traumatic stress disorder, chronic: Secondary | ICD-10-CM

## 2024-09-26 DIAGNOSIS — F5104 Psychophysiologic insomnia: Secondary | ICD-10-CM

## 2024-09-26 DIAGNOSIS — F909 Attention-deficit hyperactivity disorder, unspecified type: Secondary | ICD-10-CM

## 2024-09-26 DIAGNOSIS — F331 Major depressive disorder, recurrent, moderate: Secondary | ICD-10-CM

## 2024-09-26 DIAGNOSIS — G8929 Other chronic pain: Secondary | ICD-10-CM

## 2024-09-26 DIAGNOSIS — Z599 Problem related to housing and economic circumstances, unspecified: Secondary | ICD-10-CM

## 2024-09-26 DIAGNOSIS — Z62820 Parent-biological child conflict: Secondary | ICD-10-CM

## 2024-09-26 NOTE — Progress Notes (Signed)
 Psychotherapy Progress Note [facility] [name], PhD LP  Patient ID: Deanna George)    MRN: [id number] Therapy format: Individual psychotherapy Date: 09/26/2024      Start: 1:00p     Stop: 2:02p     Time Spent: 62 min Location: In-person   Session narrative (presenting needs, interim history, self-report of stressors and symptoms, applications of prior therapy, status changes, and interventions made in session) Per phone call earlier this week, addressed exemption from jury duty, citing extreme susceptibility to panic and poor concentration as a juror and ongoing needs to be available for intensive treatments like ECT.  Letter drafted in session, walked Pt through steps of providing her excuse, and opted to submit online on her behalf, with clarified instructions to expect an email reply from her jurisdiction.  Turning to sxs and coping, c/o ongoing tremor.  PCP about to do testing and coordinate with neuro and GI, to sort out more what needs to be done.  Continuing working understanding that she has both ongoing PTSD and panic as well as a functional movement disorder, on top of concerns for kidney and pancreatic function, not to mention dietary distortions, any of which could contribute.  Innocently queries whether the way she feels right now about her cat Azzy is normal, then before hearing back describes what she went through in the approach to euthanizing him.  When returned to her question of normal feelings, prompted her to consider what she would say, and mean, were roles reversed, confirming it's all normal grieving, including her anxiety coming home and fear of feeling too much.  Encouraged to take the chance and be back in her home, repeatedly if needed, when more or less ready and can validate it.  Meanwhile, consider available contact comforts to fill some of the vacuum left by a cuddly, warm, soft cat.  Very appreciative.  Encouraged  For  catastrophic thinking, reiterated last session's bright idea about the [employer] in her brain and hastily drew a crest to depict the department and its logo (Thanks for sharing) as a way to further lighten the mood, break anxiety, and distance from intrusive thoughts.  Affirmed current support, in that both of her kids have been reliably supportive and sensitive for months now, including not only rides and escorts but calls and offers of help.  Still staying with Summer right now while home is too evocative.  Some wonderful, warm experiences with [name], whom she had last year thought checked out and emotion-phobic, and it's bearing out how former counselor told her he would come around after he matured.  Taking her to her endoscopy and back, he put on 70s music and sang full out, and recently called her back after a moment he thought he may have been abrupt.  Really seeing both the tender child she used to have and the adult he's become in the person of a financial planner.  Affirmed and encouraged.  Therapeutic modalities: Cognitive Behavioral Therapy, Solution-Oriented/Positive Psychology, Ego-Supportive, and Psycho-education/Bibliotherapy  Mental Status/Observations:  Appearance:   Casual     Behavior:  Appropriate  Motor:  Tremor  Speech/Language:   Clear and Coherent  Affect:  Appropriate  Mood:  anxious and dysthymic  Thought process:  normal exc minor interruptions  Thought content:    worry  Sensory/Perceptual disturbances:    WNL  Orientation:  Fully oriented  Attention:  Good    Concentration:  Fair  Memory:  grossly intact  Insight:  Variable  Judgment:   Variable  Impulse Control:  Good   Risk Assessment: Danger to Self: No Self-injurious Behavior: No Danger to Others: No Physical Aggression / Violence: No Duty to Warn: No Access to Firearms a concern: No  Assessment of progress:  progressing  Diagnosis:   ICD-10-CM   1. Major depressive disorder,  recurrent episode, moderate (HCC)  F33.1     2. Chronic post-traumatic stress disorder (PTSD) with panic attacks and generalized anxiety  F43.12     3. 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 Functional neurological symptom disorder with mixed symptoms  F44.7     5. Financial difficulties  Z59.9     6. Attention deficit hyperactivity disorder (ADHD), unspecified ADHD type  F90.9     7. Relationship problem between parent and child  Z62.820     8. Other chronic pain - multiple causes incl fibromyalgia and internal organ issues  G89.29      Plan:  Continuing safety and recovery plan -- Ongoing pledge of no harm, will use crisis call service or present to hospital if unable to guarantee safety.  Recognize SI as a way of getting her own attention on feeling overwhelmed and prioritize actions to mitigate stress, find support, address material issues in health and life administration.  Ongoing availability on wait list for earlier appts.  As a complex care patient, best that she obtain referral to nurse or social worker in one or the other health system (either [facility] or [facility] based on her medical issues).  Should involve Medicaid case manager and investigate residential and DBT options. Health -- Keep up with recommended referrals and assessments, treatments for pain, kidney care, and GI distress.  Recommend common vitamin supplements if able for neurological health, including B complex and D.  Advice against smoking and sodas for general health, kidney protection, and gastric irritation. Anxiety/panic management and sleep -- Pursue/maintain appropriate sleep medication, refrain from up-dosing and stacking because it just won't work, it will only tease her into being more anxious.  Practice allowing sleep and consciously settling for doing all she can today but rest for the next.  As needed, self-affirm her disability inquiry is only to direct next actions updating her claim, not a  prelude to putting her on the street, and if further info is needed, we will address it in turn.  Where possible, continue to practice meditation and calming skills at interest, to proficiency in reducing experienced distress.   Trauma -- Practice acknowledging when intrusive memories occur, acknowledging horrors that happen, and self-validating how she made it out.  Practice acknowledging to loved ones when a moment is shadowed by bad memory and she may need relief.  Advise Al-Anon or a related fellowship, either online or in person (better) to improve sense of camaraderie and support. Relationship with adult children -- Pay close attention to open, trusting, revealing behavior and take heart.  Actively dispute catastrophic ideas of what will happen to Summer, or their relationship, and recognize PTSD imagination in progress, dispute.  Reaffirm it is developmentally Summer's business to work out for herself what her boundaries will be with peers and father.  Practice trust that Summer will use her independent mind to figure it out without wrecking her life or taking [name]'s perceived bait in the process, and that she will just as vigorously defend herself with anyone else as she does her mother if she sees the need, including her father and her  counselor.  Take any harsh utterances with as much salt as possible, because they are viciously stated for effects, not facts, and often enough can be construed as backhanded compliments (i.e., evidence of trust, actually).  OK to confront rudeness if it comes, or ask if she meant something the way it sounds, but may be more worth it to let it pass with grace and focus on controlling one's own tendencies to catastrophize and overtalk.  Advocate timeout agreement, prepared when calm, if things turns harsh.  Ongoing advice to firmly refuse her own impulses to irritating behaviors like overexplaining, overworry, over-probing, or trying in any way to lobby Summer's choices  without first getting her explicit consent to challenge something. FOO issues -- May consult birth mother and stepmother at will, but also OK not to press it if what she gets is further distressing.  Monitor for inadvertently alarmist advice and be willing to tamp it down for herself.  Explore at discretion only if cogent and careful enough to avoid being triggered to paranoia, or compulsive agreement with wild ideas, or an obligation to swallow underinformed views.  Maintain her own authority to decide how to approach it and when to refuse torturous boundaries.  Stay very realistic about expecting a maturity she can depend on from birth mother and her nominal sisters, all of whom have their own problems and blind spots, and none of whom have a true longterm family relationship with her.  Actively dispute any idea that she is rejected or indicted by people who honestly don't know her that well.  Take all family advice with skepticism, stay ready to recognize when urgency to help will jump to conclusions and make hasty judgments.  Stay ready to write off bad reactions as either too agenda-driven or just too ignorant about her to see the better.   Poverty -- Continue to endorse SSDI as she is unable to hold any gainful employment with a slew of medical and emotional conditions.  Financial counseling as available, otherwise notice and flag any overly expensive items.  Ongoing encouragement to use, and ask about, patient resources, Medicaid and Medicare rights and privileges, and utilize social work (e.g., Medicaid/[employer] sports coach) rather than make helpless assumptions. Other recommendations/advice -- As may be noted above.  Continue to utilize previously learned skills ad lib. Medication compliance -- Maintain medication as prescribed and work faithfully with relevant prescriber(s) if any changes are desired or seem indicated. Crisis service -- Aware of call list and work-in appts.  Call the clinic  on-call service, 988/hotline, 911, or present to [facility] or ER if any life-threatening psychiatric crisis. Followup -- Return for avail earlier @ PT's need, time as already scheduled, put on [place] list.  Next scheduled visit with me 11/12/2024.  Next scheduled in this office 11/12/2024.  Lamar Kendall, PhD [name], PhD LP Clinical Psychologist, [facility] [address] [place], [place] [address] [phone]

## 2024-10-16 ENCOUNTER — Telehealth: Payer: Self-pay

## 2024-10-16 NOTE — Telephone Encounter (Signed)
 PA approved Olanzapine  7.5 mg 10/13/24-10/13/25 Perform RX Medicaid

## 2024-11-01 ENCOUNTER — Other Ambulatory Visit: Payer: Self-pay | Admitting: Adult Health

## 2024-11-01 DIAGNOSIS — F4310 Post-traumatic stress disorder, unspecified: Secondary | ICD-10-CM

## 2024-11-12 ENCOUNTER — Ambulatory Visit: Admitting: Psychiatry

## 2024-11-21 ENCOUNTER — Telehealth: Payer: Self-pay | Admitting: Adult Health

## 2024-11-21 DIAGNOSIS — F411 Generalized anxiety disorder: Secondary | ICD-10-CM

## 2024-11-21 NOTE — Telephone Encounter (Signed)
 Pt states she was slowly increasing cymbalta  and needs 30 mg sent to pharm so she can start the process again    [facility] [address]  [place]

## 2024-11-24 MED ORDER — DULOXETINE HCL 30 MG PO CPEP: ORAL_CAPSULE | ORAL | 0 refills | Status: AC

## 2024-11-24 NOTE — Telephone Encounter (Signed)
 Sent 30-day supply of Cymbalta  [facility] mg to [facility] per patient request.

## 2024-11-24 NOTE — Telephone Encounter (Signed)
 Called patient to clarify message. She reports she had stopped duloxetine  and Gina had sent in Rx for 30 mg tablets to titrate up a slower pace, had 60 mg at home. She reports she stopped duloxetine  due to illness and was wanting to restart with 30 mg again before titrating up. Rx for 30 mg sent to [facility] [address]

## 2024-11-26 ENCOUNTER — Ambulatory Visit: Admitting: Psychiatry

## 2024-12-10 ENCOUNTER — Ambulatory Visit: Admitting: Psychiatry

## 2024-12-10 NOTE — Progress Notes (Signed)
 Admin note for unbilled service  Patient ID: Deanna George  MRN: [id number] DATE: 12/10/2024  Was scheduled for telehealth at 2pm, call received 15 min beforehand begging off because [name] both had no working laptop and had the date written down wrong, and staff could not guarantee coverage for a telephone visit.  Best understanding from online search is that [facility] Medicaid is in fact covering phone-only visits (source: [facility] [employer]) and maintains separate rules from Medicare.  Given the likely strong-already anxiety of her situation, the delayed opportunity to revise the visit to phone only (Tx detained in previous session), and the value of ongoing practice for [name] making her own arrangements, did not force a phone call to salvage the visit.  RS as able, no penalty.  Lamar Kendall, PhD [name], PhD LP Clinical Psychologist, [facility] [address] [place], [place] [address] [phone]

## 2024-12-24 ENCOUNTER — Ambulatory Visit: Admitting: Psychiatry

## 2025-01-06 ENCOUNTER — Ambulatory Visit: Admitting: Psychiatry
